# Patient Record
Sex: Female | Born: 1960 | Race: White | Hispanic: No | Marital: Married | Smoking: Never smoker
Health system: Southern US, Community
[De-identification: ages and names within clinical notes are randomized; demographics above are authoritative.]

## PROBLEM LIST (undated history)

## (undated) DIAGNOSIS — K589 Irritable bowel syndrome without diarrhea: Secondary | ICD-10-CM

## (undated) DIAGNOSIS — G4733 Obstructive sleep apnea (adult) (pediatric): Secondary | ICD-10-CM

## (undated) DIAGNOSIS — I2510 Atherosclerotic heart disease of native coronary artery without angina pectoris: Secondary | ICD-10-CM

## (undated) DIAGNOSIS — M1990 Unspecified osteoarthritis, unspecified site: Secondary | ICD-10-CM

## (undated) DIAGNOSIS — G4730 Sleep apnea, unspecified: Secondary | ICD-10-CM

## (undated) DIAGNOSIS — R7303 Prediabetes: Secondary | ICD-10-CM

## (undated) DIAGNOSIS — M2550 Pain in unspecified joint: Secondary | ICD-10-CM

## (undated) DIAGNOSIS — F329 Major depressive disorder, single episode, unspecified: Secondary | ICD-10-CM

## (undated) DIAGNOSIS — E785 Hyperlipidemia, unspecified: Secondary | ICD-10-CM

## (undated) DIAGNOSIS — K8020 Calculus of gallbladder without cholecystitis without obstruction: Secondary | ICD-10-CM

## (undated) DIAGNOSIS — K56609 Unspecified intestinal obstruction, unspecified as to partial versus complete obstruction: Secondary | ICD-10-CM

## (undated) DIAGNOSIS — I10 Essential (primary) hypertension: Secondary | ICD-10-CM

## (undated) DIAGNOSIS — T7840XA Allergy, unspecified, initial encounter: Secondary | ICD-10-CM

## (undated) DIAGNOSIS — T8859XA Other complications of anesthesia, initial encounter: Secondary | ICD-10-CM

## (undated) DIAGNOSIS — M549 Dorsalgia, unspecified: Secondary | ICD-10-CM

## (undated) DIAGNOSIS — K219 Gastro-esophageal reflux disease without esophagitis: Secondary | ICD-10-CM

## (undated) DIAGNOSIS — E669 Obesity, unspecified: Secondary | ICD-10-CM

## (undated) DIAGNOSIS — F32A Depression, unspecified: Secondary | ICD-10-CM

## (undated) DIAGNOSIS — R112 Nausea with vomiting, unspecified: Secondary | ICD-10-CM

## (undated) HISTORY — PX: APPENDECTOMY: SHX54

## (undated) HISTORY — DX: Gastro-esophageal reflux disease without esophagitis: K21.9

## (undated) HISTORY — DX: Obstructive sleep apnea (adult) (pediatric): G47.33

## (undated) HISTORY — DX: Dorsalgia, unspecified: M54.9

## (undated) HISTORY — DX: Allergy, unspecified, initial encounter: T78.40XA

## (undated) HISTORY — DX: Hyperlipidemia, unspecified: E78.5

## (undated) HISTORY — DX: Essential (primary) hypertension: I10

## (undated) HISTORY — DX: Major depressive disorder, single episode, unspecified: F32.9

## (undated) HISTORY — DX: Unspecified osteoarthritis, unspecified site: M19.90

## (undated) HISTORY — DX: Pain in unspecified joint: M25.50

## (undated) HISTORY — DX: Calculus of gallbladder without cholecystitis without obstruction: K80.20

## (undated) HISTORY — DX: Irritable bowel syndrome, unspecified: K58.9

## (undated) HISTORY — PX: TONSILLECTOMY: SUR1361

## (undated) HISTORY — DX: Prediabetes: R73.03

## (undated) HISTORY — DX: Depression, unspecified: F32.A

## (undated) HISTORY — PX: UPPER GASTROINTESTINAL ENDOSCOPY: SHX188

## (undated) HISTORY — DX: Obesity, unspecified: E66.9

## (undated) HISTORY — PX: COLONOSCOPY: SHX174

## (undated) HISTORY — DX: Sleep apnea, unspecified: G47.30

## (undated) HISTORY — DX: Unspecified intestinal obstruction, unspecified as to partial versus complete obstruction: K56.609

## (undated) HISTORY — PX: NASAL SINUS SURGERY: SHX719

## (undated) HISTORY — PX: BREAST REDUCTION SURGERY: SHX8

## (undated) HISTORY — PX: CHOLECYSTECTOMY: SHX55

---

## 1998-12-30 ENCOUNTER — Encounter: Payer: Self-pay | Admitting: Internal Medicine

## 1998-12-30 ENCOUNTER — Ambulatory Visit (HOSPITAL_COMMUNITY): Admission: RE | Admit: 1998-12-30 | Discharge: 1998-12-30 | Payer: Self-pay | Admitting: Internal Medicine

## 1999-05-20 ENCOUNTER — Encounter: Payer: Self-pay | Admitting: Emergency Medicine

## 1999-05-20 ENCOUNTER — Emergency Department (HOSPITAL_COMMUNITY): Admission: EM | Admit: 1999-05-20 | Discharge: 1999-05-21 | Payer: Self-pay | Admitting: Emergency Medicine

## 1999-05-25 ENCOUNTER — Other Ambulatory Visit: Admission: RE | Admit: 1999-05-25 | Discharge: 1999-05-25 | Payer: Self-pay | Admitting: *Deleted

## 1999-06-12 ENCOUNTER — Observation Stay (HOSPITAL_COMMUNITY): Admission: RE | Admit: 1999-06-12 | Discharge: 1999-06-13 | Payer: Self-pay | Admitting: General Surgery

## 1999-06-12 ENCOUNTER — Encounter (INDEPENDENT_AMBULATORY_CARE_PROVIDER_SITE_OTHER): Payer: Self-pay | Admitting: Specialist

## 2000-03-01 ENCOUNTER — Other Ambulatory Visit: Admission: RE | Admit: 2000-03-01 | Discharge: 2000-03-01 | Payer: Self-pay | Admitting: Obstetrics and Gynecology

## 2001-01-14 ENCOUNTER — Other Ambulatory Visit: Admission: RE | Admit: 2001-01-14 | Discharge: 2001-01-14 | Payer: Self-pay | Admitting: *Deleted

## 2001-08-25 ENCOUNTER — Other Ambulatory Visit: Admission: RE | Admit: 2001-08-25 | Discharge: 2001-08-25 | Payer: Self-pay | Admitting: *Deleted

## 2002-04-01 ENCOUNTER — Other Ambulatory Visit: Admission: RE | Admit: 2002-04-01 | Discharge: 2002-04-01 | Payer: Self-pay | Admitting: *Deleted

## 2002-06-06 ENCOUNTER — Encounter: Payer: Self-pay | Admitting: Emergency Medicine

## 2002-06-06 ENCOUNTER — Emergency Department (HOSPITAL_COMMUNITY): Admission: EM | Admit: 2002-06-06 | Discharge: 2002-06-06 | Payer: Self-pay | Admitting: Emergency Medicine

## 2002-09-10 ENCOUNTER — Other Ambulatory Visit: Admission: RE | Admit: 2002-09-10 | Discharge: 2002-09-10 | Payer: Self-pay | Admitting: *Deleted

## 2003-09-06 ENCOUNTER — Other Ambulatory Visit: Admission: RE | Admit: 2003-09-06 | Discharge: 2003-09-06 | Payer: Self-pay | Admitting: Family Medicine

## 2003-12-14 ENCOUNTER — Encounter: Admission: RE | Admit: 2003-12-14 | Discharge: 2003-12-14 | Payer: Self-pay | Admitting: Family Medicine

## 2004-03-13 ENCOUNTER — Encounter: Admission: RE | Admit: 2004-03-13 | Discharge: 2004-03-13 | Payer: Self-pay | Admitting: Internal Medicine

## 2004-05-05 ENCOUNTER — Encounter: Admission: RE | Admit: 2004-05-05 | Discharge: 2004-05-05 | Payer: Self-pay | Admitting: Obstetrics and Gynecology

## 2004-07-24 ENCOUNTER — Encounter: Admission: RE | Admit: 2004-07-24 | Discharge: 2004-07-24 | Payer: Self-pay | Admitting: Obstetrics and Gynecology

## 2004-10-25 ENCOUNTER — Ambulatory Visit: Payer: Self-pay | Admitting: Family Medicine

## 2004-12-22 ENCOUNTER — Ambulatory Visit: Payer: Self-pay | Admitting: Family Medicine

## 2005-01-17 ENCOUNTER — Encounter: Admission: RE | Admit: 2005-01-17 | Discharge: 2005-04-17 | Payer: Self-pay | Admitting: Family Medicine

## 2005-04-11 ENCOUNTER — Ambulatory Visit: Payer: Self-pay | Admitting: Internal Medicine

## 2005-04-18 ENCOUNTER — Encounter: Admission: RE | Admit: 2005-04-18 | Discharge: 2005-07-17 | Payer: Self-pay

## 2005-05-10 ENCOUNTER — Ambulatory Visit (HOSPITAL_BASED_OUTPATIENT_CLINIC_OR_DEPARTMENT_OTHER): Admission: RE | Admit: 2005-05-10 | Discharge: 2005-05-10 | Payer: Self-pay | Admitting: Internal Medicine

## 2005-05-13 ENCOUNTER — Ambulatory Visit: Payer: Self-pay | Admitting: Internal Medicine

## 2005-07-09 ENCOUNTER — Ambulatory Visit: Payer: Self-pay | Admitting: Internal Medicine

## 2005-07-10 ENCOUNTER — Ambulatory Visit: Payer: Self-pay | Admitting: Internal Medicine

## 2005-07-14 ENCOUNTER — Ambulatory Visit: Payer: Self-pay | Admitting: Internal Medicine

## 2005-07-16 ENCOUNTER — Ambulatory Visit: Payer: Self-pay | Admitting: Internal Medicine

## 2005-07-25 ENCOUNTER — Encounter: Admission: RE | Admit: 2005-07-25 | Discharge: 2005-10-23 | Payer: Self-pay | Admitting: Counselor

## 2005-08-20 ENCOUNTER — Ambulatory Visit: Payer: Self-pay | Admitting: Internal Medicine

## 2005-08-28 ENCOUNTER — Encounter: Admission: RE | Admit: 2005-08-28 | Discharge: 2005-08-28 | Payer: Self-pay | Admitting: *Deleted

## 2006-04-26 ENCOUNTER — Ambulatory Visit: Payer: Self-pay | Admitting: Internal Medicine

## 2006-05-21 ENCOUNTER — Ambulatory Visit: Payer: Self-pay | Admitting: Gastroenterology

## 2006-08-29 ENCOUNTER — Ambulatory Visit: Payer: Self-pay | Admitting: Internal Medicine

## 2006-08-29 ENCOUNTER — Encounter: Admission: RE | Admit: 2006-08-29 | Discharge: 2006-08-29 | Payer: Self-pay | Admitting: *Deleted

## 2006-08-30 ENCOUNTER — Ambulatory Visit: Payer: Self-pay | Admitting: Internal Medicine

## 2006-08-30 LAB — CONVERTED CEMR LAB
Chloride: 107 meq/L (ref 96–112)
Creatinine, Ser: 0.8 mg/dL (ref 0.4–1.2)
Free T4: 0.5 ng/dL — ABNORMAL LOW (ref 0.9–1.8)
Glomerular Filtration Rate, Af Am: 100 mL/min/{1.73_m2}
Glucose, Bld: 82 mg/dL (ref 70–99)
HDL: 40.9 mg/dL (ref >39.0)
MCHC: 33.7 g/dL (ref 30.0–36.0)
MCV: 89.3 fL (ref 78.0–100.0)
RBC: 4.54 M/uL (ref 3.87–5.11)
RDW: 12.7 % (ref 11.5–14.6)
Sodium: 141 meq/L (ref 135–145)
T3, Free: 3.1 pg/mL (ref 2.3–4.2)
Triglyceride fasting, serum: 99 mg/dL (ref 0–149)

## 2006-10-01 ENCOUNTER — Ambulatory Visit: Payer: Self-pay | Admitting: Internal Medicine

## 2006-12-11 ENCOUNTER — Ambulatory Visit: Payer: Self-pay | Admitting: Internal Medicine

## 2006-12-11 LAB — CONVERTED CEMR LAB: TSH: 3.33 microintl units/mL (ref 0.35–5.50)

## 2007-02-09 ENCOUNTER — Encounter: Payer: Self-pay | Admitting: Internal Medicine

## 2007-06-20 ENCOUNTER — Encounter: Payer: Self-pay | Admitting: Internal Medicine

## 2007-06-27 ENCOUNTER — Encounter: Payer: Self-pay | Admitting: Internal Medicine

## 2007-07-10 ENCOUNTER — Encounter: Admission: RE | Admit: 2007-07-10 | Discharge: 2007-09-30 | Payer: Self-pay | Admitting: Surgery

## 2007-08-04 ENCOUNTER — Encounter: Payer: Self-pay | Admitting: Internal Medicine

## 2007-08-22 ENCOUNTER — Encounter: Payer: Self-pay | Admitting: Internal Medicine

## 2007-09-01 ENCOUNTER — Encounter: Admission: RE | Admit: 2007-09-01 | Discharge: 2007-09-01 | Payer: Self-pay | Admitting: Obstetrics & Gynecology

## 2007-09-22 ENCOUNTER — Encounter: Admission: RE | Admit: 2007-09-22 | Discharge: 2007-12-21 | Payer: Self-pay | Admitting: Surgery

## 2007-09-22 ENCOUNTER — Ambulatory Visit: Payer: Self-pay | Admitting: Internal Medicine

## 2007-09-22 DIAGNOSIS — F419 Anxiety disorder, unspecified: Secondary | ICD-10-CM

## 2007-09-22 DIAGNOSIS — I10 Essential (primary) hypertension: Secondary | ICD-10-CM | POA: Insufficient documentation

## 2007-09-22 DIAGNOSIS — J3089 Other allergic rhinitis: Secondary | ICD-10-CM

## 2007-09-22 DIAGNOSIS — E785 Hyperlipidemia, unspecified: Secondary | ICD-10-CM | POA: Insufficient documentation

## 2007-09-22 DIAGNOSIS — G4733 Obstructive sleep apnea (adult) (pediatric): Secondary | ICD-10-CM | POA: Insufficient documentation

## 2007-09-22 DIAGNOSIS — Z9189 Other specified personal risk factors, not elsewhere classified: Secondary | ICD-10-CM

## 2007-09-22 DIAGNOSIS — F32A Depression, unspecified: Secondary | ICD-10-CM | POA: Insufficient documentation

## 2007-09-22 DIAGNOSIS — E349 Endocrine disorder, unspecified: Secondary | ICD-10-CM | POA: Insufficient documentation

## 2007-09-22 DIAGNOSIS — J302 Other seasonal allergic rhinitis: Secondary | ICD-10-CM | POA: Insufficient documentation

## 2007-09-22 DIAGNOSIS — F329 Major depressive disorder, single episode, unspecified: Secondary | ICD-10-CM | POA: Insufficient documentation

## 2007-09-22 HISTORY — DX: Other specified personal risk factors, not elsewhere classified: Z91.89

## 2007-09-22 HISTORY — PX: LAPAROSCOPIC GASTRIC BANDING: SHX1100

## 2007-09-23 ENCOUNTER — Encounter: Payer: Self-pay | Admitting: Internal Medicine

## 2007-10-06 ENCOUNTER — Ambulatory Visit (HOSPITAL_COMMUNITY): Admission: RE | Admit: 2007-10-06 | Discharge: 2007-10-07 | Payer: Self-pay | Admitting: Surgery

## 2007-10-22 ENCOUNTER — Encounter: Payer: Self-pay | Admitting: Internal Medicine

## 2008-01-15 ENCOUNTER — Encounter: Admission: RE | Admit: 2008-01-15 | Discharge: 2008-01-15 | Payer: Self-pay | Admitting: Surgery

## 2008-04-15 ENCOUNTER — Encounter: Admission: RE | Admit: 2008-04-15 | Discharge: 2008-04-15 | Payer: Self-pay | Admitting: Surgery

## 2008-06-24 ENCOUNTER — Encounter: Payer: Self-pay | Admitting: Internal Medicine

## 2008-07-26 ENCOUNTER — Ambulatory Visit: Payer: Self-pay | Admitting: Internal Medicine

## 2008-07-30 ENCOUNTER — Ambulatory Visit: Payer: Self-pay | Admitting: Internal Medicine

## 2008-08-05 ENCOUNTER — Encounter (INDEPENDENT_AMBULATORY_CARE_PROVIDER_SITE_OTHER): Payer: Self-pay | Admitting: *Deleted

## 2008-08-05 LAB — CONVERTED CEMR LAB
ALT: 21 units/L (ref 0–35)
AST: 19 units/L (ref 0–37)
BUN: 19 mg/dL (ref 6–23)
Basophils Absolute: 0 10*3/uL (ref 0.0–0.1)
Basophils Relative: 0.2 % (ref 0.0–3.0)
CO2: 28 meq/L (ref 19–32)
Chloride: 104 meq/L (ref 96–112)
Cholesterol: 163 mg/dL (ref 0–200)
Eosinophils Relative: 1.1 % (ref 0.0–5.0)
Glucose, Bld: 75 mg/dL (ref 70–99)
Hemoglobin: 13.3 g/dL (ref 12.0–15.0)
LDL Cholesterol: 92 mg/dL (ref 0–99)
Lymphocytes Relative: 29.4 % (ref 12.0–46.0)
MCHC: 34.4 g/dL (ref 30.0–36.0)
Neutro Abs: 5.6 10*3/uL (ref 1.4–7.7)
Potassium: 3.9 meq/L (ref 3.5–5.1)
RBC: 4.38 M/uL (ref 3.87–5.11)
Saturation Ratios: 23.2 % (ref 20.0–50.0)
TSH: 3.03 microintl units/mL (ref 0.35–5.50)
Transferrin: 310.7 mg/dL (ref >=212.0)
WBC: 8.6 10*3/uL (ref 4.5–10.5)

## 2008-09-02 ENCOUNTER — Encounter: Admission: RE | Admit: 2008-09-02 | Discharge: 2008-09-02 | Payer: Self-pay | Admitting: Obstetrics and Gynecology

## 2008-09-15 ENCOUNTER — Encounter (INDEPENDENT_AMBULATORY_CARE_PROVIDER_SITE_OTHER): Payer: Self-pay | Admitting: Diagnostic Radiology

## 2008-09-15 ENCOUNTER — Encounter: Admission: RE | Admit: 2008-09-15 | Discharge: 2008-09-15 | Payer: Self-pay | Admitting: Obstetrics and Gynecology

## 2008-10-27 ENCOUNTER — Telehealth: Payer: Self-pay | Admitting: Family Medicine

## 2008-11-10 ENCOUNTER — Telehealth (INDEPENDENT_AMBULATORY_CARE_PROVIDER_SITE_OTHER): Payer: Self-pay | Admitting: *Deleted

## 2008-11-12 ENCOUNTER — Telehealth (INDEPENDENT_AMBULATORY_CARE_PROVIDER_SITE_OTHER): Payer: Self-pay | Admitting: *Deleted

## 2009-06-14 ENCOUNTER — Encounter: Admission: RE | Admit: 2009-06-14 | Discharge: 2009-06-14 | Payer: Self-pay | Admitting: Obstetrics and Gynecology

## 2009-09-01 ENCOUNTER — Encounter: Payer: Self-pay | Admitting: Internal Medicine

## 2009-09-01 ENCOUNTER — Encounter: Admission: RE | Admit: 2009-09-01 | Discharge: 2009-09-01 | Payer: Self-pay | Admitting: Internal Medicine

## 2009-09-05 LAB — CONVERTED CEMR LAB

## 2009-09-07 ENCOUNTER — Encounter: Payer: Self-pay | Admitting: Internal Medicine

## 2009-09-12 ENCOUNTER — Encounter: Payer: Self-pay | Admitting: Internal Medicine

## 2009-10-12 ENCOUNTER — Ambulatory Visit: Payer: Self-pay | Admitting: Internal Medicine

## 2009-10-12 DIAGNOSIS — K219 Gastro-esophageal reflux disease without esophagitis: Secondary | ICD-10-CM | POA: Insufficient documentation

## 2009-10-12 LAB — CONVERTED CEMR LAB: Vit D, 25-Hydroxy: 31 ng/mL (ref 30–89)

## 2009-10-17 ENCOUNTER — Encounter (INDEPENDENT_AMBULATORY_CARE_PROVIDER_SITE_OTHER): Payer: Self-pay | Admitting: *Deleted

## 2009-10-17 LAB — CONVERTED CEMR LAB
BUN: 10 mg/dL (ref 6–23)
Bilirubin, Direct: 0 mg/dL (ref 0.0–0.3)
Chloride: 109 meq/L (ref 96–112)
Eosinophils Relative: 1.6 % (ref 0.0–5.0)
GFR calc non Af Amer: 71.28 mL/min (ref >60)
Glucose, Bld: 76 mg/dL (ref 70–99)
HCT: 41.3 % (ref 36.0–46.0)
HDL: 52 mg/dL (ref >39.00)
Hemoglobin: 13.7 g/dL (ref 12.0–15.0)
LDL Cholesterol: 111 mg/dL — ABNORMAL HIGH (ref 0–99)
Lymphs Abs: 3.4 10*3/uL (ref 0.7–4.0)
MCV: 93.4 fL (ref 78.0–100.0)
Monocytes Absolute: 0.5 10*3/uL (ref 0.1–1.0)
Monocytes Relative: 4.4 % (ref 3.0–12.0)
Neutro Abs: 7.2 10*3/uL (ref 1.4–7.7)
Platelets: 308 10*3/uL (ref 150.0–400.0)
Potassium: 4.8 meq/L (ref 3.5–5.1)
RDW: 12.4 % (ref 11.5–14.6)
Sodium: 145 meq/L (ref 135–145)
Total Bilirubin: 0.6 mg/dL (ref 0.3–1.2)
VLDL: 19.2 mg/dL (ref 0.0–40.0)
WBC: 11.4 10*3/uL — ABNORMAL HIGH (ref 4.5–10.5)

## 2010-07-12 ENCOUNTER — Telehealth: Payer: Self-pay | Admitting: Internal Medicine

## 2010-08-22 ENCOUNTER — Telehealth: Payer: Self-pay | Admitting: Internal Medicine

## 2010-08-23 ENCOUNTER — Ambulatory Visit: Payer: Self-pay | Admitting: Internal Medicine

## 2010-08-24 ENCOUNTER — Encounter (INDEPENDENT_AMBULATORY_CARE_PROVIDER_SITE_OTHER): Payer: Self-pay | Admitting: *Deleted

## 2010-08-24 LAB — CONVERTED CEMR LAB: Triglycerides: 150 mg/dL

## 2010-09-08 ENCOUNTER — Ambulatory Visit: Payer: Self-pay | Admitting: Internal Medicine

## 2010-09-08 ENCOUNTER — Encounter: Admission: RE | Admit: 2010-09-08 | Discharge: 2010-09-08 | Payer: Self-pay | Admitting: Internal Medicine

## 2010-09-12 LAB — CONVERTED CEMR LAB
BUN: 10 mg/dL (ref 6–23)
Basophils Absolute: 0 10*3/uL (ref 0.0–0.1)
Calcium: 9.2 mg/dL (ref 8.4–10.5)
Cholesterol: 182 mg/dL (ref 0–200)
Creatinine, Ser: 0.9 mg/dL (ref 0.4–1.2)
Eosinophils Absolute: 0.1 10*3/uL (ref 0.0–0.7)
Folate: >20 ng/mL
GFR calc non Af Amer: 72.87 mL/min (ref >60)
Glucose, Bld: 76 mg/dL (ref 70–99)
HDL: 54.9 mg/dL (ref >39.00)
LDL Cholesterol: 117 mg/dL — ABNORMAL HIGH (ref 0–99)
Lymphocytes Relative: 32.5 % (ref 12.0–46.0)
MCHC: 33.9 g/dL (ref 30.0–36.0)
Monocytes Relative: 3.5 % (ref 3.0–12.0)
Neutro Abs: 6.2 10*3/uL (ref 1.4–7.7)
Neutrophils Relative %: 62.6 % (ref 43.0–77.0)
Platelets: 321 10*3/uL (ref 150.0–400.0)
RDW: 13.6 % (ref 11.5–14.6)
TSH: 2.25 microintl units/mL (ref 0.35–5.50)
VLDL: 10.4 mg/dL (ref 0.0–40.0)

## 2010-09-21 ENCOUNTER — Ambulatory Visit: Payer: Self-pay | Admitting: Internal Medicine

## 2010-11-02 ENCOUNTER — Telehealth (INDEPENDENT_AMBULATORY_CARE_PROVIDER_SITE_OTHER): Payer: Self-pay | Admitting: *Deleted

## 2010-11-06 ENCOUNTER — Telehealth: Payer: Self-pay | Admitting: Internal Medicine

## 2010-11-21 NOTE — Assessment & Plan Note (Signed)
Summary: cpx/lab/cbs   Vital Signs:  Patient profile:   50 year old female Height:      64.25 inches Weight:      172.38 pounds Pulse rate:   90 / minute Pulse rhythm:   regular BP sitting:   128 / 82  (left arm) Cuff size:   regular  Vitals Entered By: Army Fossa CMA (September 21, 2010 10:07 AM) CC: CPX- already had labwork. Comments Discuss Pt being so cold. [facility].   History of Present Illness:  CPX  Current Medications (verified): 1)  Imitrex 25 Mg  Tabs (Sumatriptan Succinate) .... As Needed (Menstrual Migraines) 2)  Lisinopril 10 Mg  Tabs (Lisinopril) .... Qd 3)  Lipitor 20 Mg  Tabs (Atorvastatin Calcium) .Marland Kitchen.. 1 By Mouth Once Daily 4)  Flonase 50 Mcg/act  Susp (Fluticasone Propionate) .... Prn 5)  Nortriptyline Hcl 75 Mg  Caps (Nortriptyline Hcl) .... Qd 6)  Mulit-Vitamin .Marland Kitchen.. 1 By Mouth Once Daily 7)  Deplin 7.5 Mg Tabs (L-Methylfolate) .... Once Daily 8)  Ranitidine Hcl 75 Mg Tabs (Ranitidine Hcl) .Marland Kitchen.. 1 By Mouth Once Daily 9)  Bayer Low Strength 81 Mg Tbec (Aspirin) .Marland Kitchen.. 1 By Mouth Once Daily 10)  Calcium W/vitamin D .... 1 Daily 11)  Cryselle-28 0.3-30 Mg-Mcg Tabs (Norgestrel-Ethinyl Estradiol) .... As Directed  Allergies (verified): 1)  ! Pcn  Past History:  Past Medical History: Lap band surgery 12-08 Dr Daphine Deutscher Allergic rhinitis Depression Hyperlipidemia Hypertension sleep apnea, no longer on  CPAP GERD  Past Surgical History: Appendectomy Cholecystectomy Tonsillectomy Laser surgery (Cervix) reduction mammoplasty Lap band surgery 12-08 Dr Daphine Deutscher  Family History: Reviewed history from 07/26/2008 and no changes required. DM-- (-)  MI-- (-) CAD-- CABG F , M, GP colon ca-- (-)  breast ca-- (-)   Social History: Married no children Occupation: [employer]-- healthy most exercise-- not as much , but improving. 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 Resp:  Denies cough and shortness of breath. GI:  Denies bloody stools, nausea, and vomiting. GU:  Denies discharge, dysuria, and hematuria. Psych:  symptoms well controlled .  Physical Exam  General:  alert, well-developed, and well-nourished.   Neck:  no masses and no thyromegaly.   Lungs:  normal respiratory effort, no intercostal retractions, no accessory muscle use, and normal breath sounds.   Heart:  normal rate, regular rhythm, no murmur, and no gallop.   Abdomen:  soft and no distention.  has a s.q. nodule in the right upper quadrant  (from  lap band surgery per patient), diffuse mild tenderness throughout without mass or rebound  Pulses:  good bilateral pedal and radial pulses. Good capillary refills in fingers and toes Extremities:  good radial pulses, examination of the hands and nails is normal Psych:  Oriented X3, memory intact for recent and remote, normally interactive, good eye contact, not anxious appearing, and not depressed appearing.     Impression & Recommendations:  Problem # 1:  HEALTH SCREENING (ICD-V70.0) Td 07 got a flu shot already   Female care per  gynecology 08/2009 PAP ----> ASCUS, had a pap this month as well , results pending  08-2010 negative MMG  colonoscopy never had one   diet and exercise discussed  all  labs discussed RTC 1 year  Problem # 2:  other issues --feels cold all the time, no change in the color  of her extremities, symptoms associated with cold weather. TSH normal Vascular exam normal Recommend observation. --also has on and off, ill-defined, generalized abdominal tenderness to palpation. Otherwise  GI ROS is negative symptoms were there before  she had her bariatric surgery but they are slightly worse since then No anemia Patient will call if symptoms increase  Complete Medication List: 1)  Imitrex 25 Mg Tabs (Sumatriptan succinate) .... As needed (menstrual migraines) 2)  Lisinopril 10 Mg Tabs  (Lisinopril) .... Qd 3)  Lipitor 20 Mg Tabs (Atorvastatin calcium) .Marland Kitchen.. 1 by mouth once daily 4)  Flonase 50 Mcg/act Susp (Fluticasone propionate) .... Prn 5)  Nortriptyline Hcl 75 Mg Caps (Nortriptyline hcl) .... Qd 6)  Mulit-vitamin  .Marland Kitchen.. 1 by mouth once daily 7)  Deplin 7.5 Mg Tabs (L-methylfolate) .... Once daily 8)  Ranitidine Hcl 75 Mg Tabs (Ranitidine hcl) .Marland Kitchen.. 1 by mouth once daily 9)  Bayer Low Strength 81 Mg Tbec (Aspirin) .Marland Kitchen.. 1 by mouth once daily 10)  Calcium W/vitamin D  .... 1 daily 11)  Cryselle-28 0.3-30 Mg-mcg Tabs (Norgestrel-ethinyl estradiol) .... As directed  Patient Instructions: 1)  Please schedule a follow-up appointment in 6 months .  Prescriptions: CALCIUM W/VITAMIN D 1 DAILY  #30 x 12   Entered by:   Army Fossa CMA   Authorized by:   Nolon Rod. Cheryllynn Sarff MD   Signed by:   Army Fossa CMA on 09/21/2010   Method used:   Print then Give to Patient   RxID:   [id number] BAYER LOW STRENGTH 81 MG TBEC (ASPIRIN) 1 by mouth once daily  #30 x 12   Entered by:   Army Fossa CMA   Authorized by:   Nolon Rod. Garielle Mroz MD   Signed by:   Army Fossa CMA on 09/21/2010   Method used:   Print then Give to Patient   RxID:   [id number] RANITIDINE HCL 75 MG TABS (RANITIDINE HCL) 1 by mouth once daily  #30 x 12   Entered by:   Army Fossa CMA   Authorized by:   Nolon Rod. Saurabh Hettich MD   Signed by:   Army Fossa CMA on 09/21/2010   Method used:   Print then Give to Patient   RxID:   [id number] MULIT-VITAMIN 1 by mouth once daily  #30 x 12   Entered by:   Army Fossa CMA   Authorized by:   Nolon Rod. Lizania Bouchard MD   Signed by:   Army Fossa CMA on 09/21/2010   Method used:   Print then Give to Patient   RxID:   [id number] FLONASE 50 MCG/ACT  SUSP (FLUTICASONE PROPIONATE) prn  #3 x 3   Entered by:   Army Fossa CMA   Authorized by:   Nolon Rod. Slate Debroux MD   Signed by:   Army Fossa CMA on 09/21/2010   Method used:   Print then Give to Patient    RxID:   [id number] LIPITOR 20 MG  TABS (ATORVASTATIN CALCIUM) 1 by mouth once daily  #90 x 3   Entered by:   Army Fossa CMA   Authorized by:   Nolon Rod. Crystalina Stodghill MD   Signed by:   Army Fossa CMA on 09/21/2010   Method used:   Print then Give to Patient   RxID:   [id number] LISINOPRIL 10 MG  TABS (LISINOPRIL) qd  #90 x 3   Entered by:   Army Fossa CMA   Authorized by:   Nolon Rod. Sanyiah Kanzler MD   Signed  by:   Army Fossa CMA on 09/21/2010   Method used:   Print then Give to Patient   RxID:   [id number]    Orders Added: 1)  Est. Patient age 73-64 [99396]     Risk Factors:  Alcohol use:  no

## 2010-11-21 NOTE — Miscellaneous (Signed)
Summary: labs from wellness exam  Clinical Lists Changes  Observations: Added new observation of TRIGLYC TOT: 150 mg/dL (47/82/9562 13:08) Added new observation of HDL: 75 mg/dL (65/78/4696 29:52) Added new observation of GLUCOSE SER: 95 mg/dL (84/13/2440 10:27)   done 05/17/2010

## 2010-11-21 NOTE — Progress Notes (Signed)
Summary: Deplin request  Phone Note Call from Patient Call back at Home Phone [phone]   Summary of Call: Patient is requesting prescription for Deplin due to not being able to get it from other MD. Patient called their office and has been notified that due to not being seen in over a year, she is no longer a patient there. Patient is requesting to receive from [facility]. Please advise. Initial call taken by: Lucious Groves CMA,  July 12, 2010 11:32 AM  Follow-up for Phone Call        okay to refill, please clarify the dose with the patient Follow-up by: [facility] E. Paz MD,  July 12, 2010 3:38 PM  Additional Follow-up for Phone Call Additional follow up Details #1::        Patient notified and would like enough to get her by until office visit. Patient has appt in Nov, so will #60. Patient aware to pick up tomorrow afternoon. Additional Follow-up by: Lucious Groves CMA,  July 12, 2010 4:35 PM    Prescriptions: DEPLIN 7.5 MG TABS (L-METHYLFOLATE) once daily  #30 x 1   Entered by:   Lucious Groves CMA   Authorized by:   Nolon Rod. Paz MD   Signed by:   Lucious Groves CMA on 07/12/2010   Method used:   Print then Give to Patient   RxID:   [id number]

## 2010-11-21 NOTE — Assessment & Plan Note (Signed)
Summary: DISCUSS/REFILL MEDS/KB   Vital Signs:  Patient profile:   50 year old female Height:      64.25 inches Weight:      166 pounds BMI:     28.37 Pulse rate:   85 / minute Pulse rhythm:   regular BP sitting:   110 / 72  (left arm) Cuff size:   regular  Vitals Entered By: Army Fossa CMA (August 23, 2010 3:50 PM) CC: Pt here to discuss meds Comments refill on deplin and nortriptyline  3 month supply print our rx's   History of Present Illness: patient likes to transfer the care of her depression from her psychiatrist to this office. She has a long history of depression, in the past she tried a # of medicines. She met with Dr. Donell Beers 4 years ago, after several tries she ended up taking amitriptyline and eventually adding deplin, after which she felt excellent. She has been on the current medications for 2 years and this is stable.  ROS complaining of feeling cold in her extremities  a lot, this is going on x a while. Denies change in the color of her hands with changes in temperature weight is stable She brought the results of her screening at work, HDL is excellent. her sugar is normal   Current Medications (verified): 1)  Imitrex 25 Mg  Tabs (Sumatriptan Succinate) .... As Needed (Menstrual Migraines) 2)  Lisinopril 10 Mg  Tabs (Lisinopril) .... Qd 3)  Lipitor 20 Mg  Tabs (Atorvastatin Calcium) .Marland Kitchen.. 1 By Mouth Once Daily 4)  Flonase 50 Mcg/act  Susp (Fluticasone Propionate) .... Prn 5)  Nortriptyline Hcl 75 Mg  Caps (Nortriptyline Hcl) .... Qd 6)  Mulit-Vitamin .Marland Kitchen.. 1 By Mouth Once Daily 7)  Deplin 7.5 Mg Tabs (L-Methylfolate) .... Once Daily 8)  Ranitidine Hcl 75 Mg Tabs (Ranitidine Hcl) .Marland Kitchen.. 1 By Mouth Once Daily 9)  Bayer Low Strength 81 Mg Tbec (Aspirin) .Marland Kitchen.. 1 By Mouth Once Daily 10)  Calcium W/vitamin D .... 1 Daily 11)  Cryselle-28 0.3-30 Mg-Mcg Tabs (Norgestrel-Ethinyl Estradiol) .... As Directed  Allergies (verified): 1)  ! Pcn  Past  History:  Past Medical History: Reviewed history from 10/12/2009 and no changes required. Lap band surgery 12-08 Dr Daphine Deutscher Allergic rhinitis Depression Hyperlipidemia Hypertension sleep apnea, no longer on  CPAP GERD  Past Surgical History: Reviewed history from 10/12/2009 and no changes required. Appendectomy Cholecystectomy Tonsillectomy Laser surgery (Cervix) reduction mammoplasty  Social History: Reviewed history from 10/12/2009 and no changes required. Married no children Occupation: [employer]-- healthy exercise-- not as much , not consistently tobacco-- no ETOH-- rarely  Physical Exam  General:  alert and well-developed.   Lungs:  normal respiratory effort, no intercostal retractions, no accessory muscle use, and normal breath sounds.   Heart:  normal rate, regular rhythm, no murmur, and no gallop.   Extremities:  good radial pulses, examination of the hands and nails is normal   Impression & Recommendations:  Problem # 1:  DEPRESSION (ICD-311) transferring her care  to this office, see history of present illness will RF nortriptyline and deplin   Her updated medication list for this problem includes:    Nortriptyline Hcl 75 Mg Caps (Nortriptyline hcl) ..... Qd  Problem # 2:  HYPERTENSION (ICD-401.9) at goal  Her updated medication list for this problem includes:    Lisinopril 10 Mg Tabs (Lisinopril) ..... Qd  BP today: 110/72 Prior BP: 104/60 (10/12/2009)  Labs Reviewed: K+: 4.8 (10/12/2009) Creat: : 0.9 (  10/12/2009)   Chol: 182 (10/12/2009)   HDL: 52.00 (10/12/2009)   LDL: 111 (10/12/2009)   TG: 96.0 (10/12/2009)  Problem # 3:  HEALTH SCREENING (ICD-V70.0) likes to get her blood work ahead of her physical exam, see instructions Wonders why she feels so cold,not clear to me why. We'll recheck a TSH. No evidence of Raynaud's phenomena on clinical grounds  Complete Medication List: 1)  Imitrex 25 Mg Tabs (Sumatriptan succinate) .... As needed  (menstrual migraines) 2)  Lisinopril 10 Mg Tabs (Lisinopril) .... Qd 3)  Lipitor 20 Mg Tabs (Atorvastatin calcium) .Marland Kitchen.. 1 by mouth once daily 4)  Flonase 50 Mcg/act Susp (Fluticasone propionate) .... Prn 5)  Nortriptyline Hcl 75 Mg Caps (Nortriptyline hcl) .... Qd 6)  Mulit-vitamin  .Marland Kitchen.. 1 by mouth once daily 7)  Deplin 7.5 Mg Tabs (L-methylfolate) .... Once daily 8)  Ranitidine Hcl 75 Mg Tabs (Ranitidine hcl) .Marland Kitchen.. 1 by mouth once daily 9)  Bayer Low Strength 81 Mg Tbec (Aspirin) .Marland Kitchen.. 1 by mouth once daily 10)  Calcium W/vitamin D  .... 1 daily 11)  Cryselle-28 0.3-30 Mg-mcg Tabs (Norgestrel-ethinyl estradiol) .... As directed  Patient Instructions: 1)  came back few days before your physical for labs,  fasting 2)  BMP, TSH, CBC , Vit D, B12, Folic Acid, FLP.  ---  Dx V70 Prescriptions: NORTRIPTYLINE HCL 75 MG  CAPS (NORTRIPTYLINE HCL) qd  #90 x 4   Entered and Authorized by:   Elita Quick E. Paz MD   Signed by:   Nolon Rod. Paz MD on 08/23/2010   Method used:   Print then Give to Patient   RxID:   [id number] DEPLIN 7.5 MG TABS (L-METHYLFOLATE) once daily  #180 x 3   Entered and Authorized by:   Nolon Rod. Paz MD   Signed by:   Nolon Rod. Paz MD on 08/23/2010   Method used:   Print then Give to Patient   RxID:   [id number]    Orders Added: 1)  Est. Patient Level III [84696]

## 2010-11-21 NOTE — Progress Notes (Signed)
Summary: question about meds from another doctor  Phone Note Call from Patient Call back at Home Phone [phone]   Caller: Patient Summary of Call: patient see psychologist Dr Bari Mantis likes the doctor, but she does not care for the office staff in his office  Will Dr Drue Novel take over his meds?  If not, she needs a referral for a different psychologist ---needs  to be seen before Thanksgiving so she can get a new prescription from the new doctor  please let her know at [phone] Initial call taken by: Jerolyn Shin,  August 22, 2010 4:05 PM  Follow-up for Phone Call        Please advise. Lucious Groves CMA  August 22, 2010 4:35 PM   Additional Follow-up for Phone Call Additional follow up Details #1::        please schedule a office visit, if he is in stable, I can start prescribing her medicines Additional Follow-up by: [facility] E. Paz MD,  August 22, 2010 5:11 PM    Additional Follow-up for Phone Call Additional follow up Details #2::    Patient notified and states that she feels fine, but is worried about running out of meds. Patient schedule visit for today to discuss. Follow-up by: Lucious Groves CMA,  August 23, 2010 12:16 PM

## 2010-11-23 NOTE — Progress Notes (Signed)
Summary: Refill Request  Phone Note Refill Request Call back at [phone] Message from:  Pharmacy on November 02, 2010 11:41 AM  Refills Requested: Medication #1:  FLONASE 50 MCG/ACT  SUSP prn   Dosage confirmed as above?Dosage Confirmed   Supply Requested: 3   Last Refilled: 09/21/2010 [facility]  Next Appointment Scheduled: none Initial call taken by: Harold Barban,  November 02, 2010 11:41 AM    Prescriptions: Aleda Grana 50 MCG/ACT  SUSP (FLUTICASONE PROPIONATE) prn  #3 x 1   Entered by:   Army Fossa CMA   Authorized by:   Nolon Rod. Paz MD   Signed by:   Army Fossa CMA on 11/02/2010   Method used:   Faxed to ...       [facility] (mail-order)             , [place]         Ph: [phone]       Fax: [phone]   RxID:   [id number]

## 2010-11-23 NOTE — Progress Notes (Signed)
Summary: Flonase rx correction  Phone Note From Pharmacy   Summary of Call: [facility] called the office b/c they will not accept flonase prescription as written. It must be more detailed or will not be filled. Initial call taken by: Lucious Groves CMA,  November 06, 2010 9:11 AM    New/Updated Medications: FLONASE 50 MCG/ACT  SUSP (FLUTICASONE PROPIONATE) 2 sprays each nostril prn Prescriptions: FLONASE 50 MCG/ACT  SUSP (FLUTICASONE PROPIONATE) 2 sprays each nostril prn  #3 x 1   Entered by:   Lucious Groves CMA   Authorized by:   Nolon Rod. Truth Wolaver MD   Signed by:   Lucious Groves CMA on 11/06/2010   Method used:   Faxed to ...       [facility] (mail-order)             , [place]         Ph: [phone]       Fax: [phone]   RxID:   [id number]

## 2011-03-06 NOTE — Op Note (Signed)
[name], [name]                 ACCOUNT NO.:  [id number]   MEDICAL RECORD NO.:  [id number]          PATIENT TYPE:  AMB   LOCATION:  DAY                          FACILITY:  [facility]   PHYSICIAN:  Thornton Park. Daphine Deutscher, MD  DATE OF BIRTH:  [birth year]   DATE OF PROCEDURE:  10/06/2007  DATE OF DISCHARGE:                               OPERATIVE REPORT   PREOPERATIVE DIAGNOSIS:  Morbid obesity, BMI 36.5 with hypertension,  hypercholesterolemia, sleep apnea and depression.   PROCEDURE:  Lap band APS (Allergan).   SURGEON:  Luretha Murphy, M.D.   ASSISTANT:  Lorne Skeens. Hoxworth, M.D.   DESCRIPTION OF PROCEDURE:  Rebekah [name] is a 50 year old lady taken to  room 1 on the afternoon of October 06, 2007 given general anesthesia.  The abdomen was prepped with Techni-Care and draped sterilely.  The  abdomen was entered through the left upper quadrant with 0 degree 10 mm  scope OptiVu and abdomen was insufflated again without difficulty and  entered.  Standard port placement was used for band include the 15 in  the upper right midline.  The incision was made on the left side of the  stomach along the crura. Then I put in the APS balloon sizer, blew it up  to 15 mL, put it back and did not see significant hiatal hernia.  Went  ahead and passed the band passer from the right crus to the left side  easily and then inserted the APS band which I then threaded through,  brought around, locked it after putting it over the sizer.  Then I  plicated the stomach anteriorly over with three sutures using tie knots.  Everything looked good and I brought it out through the lower port which  I enlarged.  I flushed it, let the excess pressure come off and I  connected it to the port and then I sutured this into the fascia with  four sutures of 2-0 Prolene without difficulty.  Wounds were all  irrigated closed 4-0 Vicryl with Benzoin Steri-Strips.  The patient  seemed to tolerate the procedure well was taken to  recovery room in  satisfactory addition.      Thornton Park Daphine Deutscher, MD  Electronically Signed     MBM/MEDQ  D:  10/06/2007  T:  10/07/2007  Job:  [id number]   cc:   Willow Ora, MD  [phone] [address] [place], [place] [address]

## 2011-03-09 NOTE — Assessment & Plan Note (Signed)
HEALTHCARE                           GASTROENTEROLOGY OFFICE NOTE   TIMICA, MARCOM                        MRN:          [id number]  DATE:05/21/2006                            DOB:          [birth year]    REFERRING PHYSICIAN:  Willow Ora, MD.   REASON FOR REFERRAL:  Recurrent diarrhea.   HISTORY OF PRESENT ILLNESS:  Rebekah [name] is a 50 year old white female  referred through the courtesy of Dr. Willow Ora.  She relates an acute episode  of diarrhea in May of this year while at the beach.  The other members of  her family with her did not become ill.  She noted a fever and watery  nonbloody diarrhea that lasted for about five or six days.  After that, the  fever and diarrhea abated and her bowel habits returned to normal.  Since  then, she has had relapses of nonbloody watery diarrhea without fevers,  abdominal pain, anorexia, nausea or vomiting.  Her symptoms have recurred  about every week or two.  She was given an empiric course of Cipro for three  days by Dr. Drue Novel and since that time, she has not had any recurrent diarrhea.  It has been approximately two or two and a half weeks.  She has a history of  irritable bowel syndrome with diarrhea, bloating and abdominal discomfort  that was active in her 41s but it has not been active since that time.  Her  current symptoms are not associated with abdominal pain or bloating.  She  relates no history of antibiotic usage prior to the onset of her symptoms.  She has had long-term difficulties tolerating lactose products with  increased intestinal gas.  She notes no dietary changes and drinks about one  to two caffeinated beverages a day.  No family history of colon cancer,  colon polyps or inflammatory bowel disease.  Recent stool studies for fecal  leukocytes, C. difficile toxin and ova and parasites were negative.   PAST MEDICAL HISTORY:  1.  Hypertension.  2.  Hyperlipidemia.  3.  Depression.  4.   Irritable bowel syndrome - inactive.  5.  Allergies as a child.  6.  Status post cholecystectomy.  7.  Status post appendectomy.   CURRENT MEDICATIONS:  Listed on the chart, are being reviewed.   ALLERGIES:  PENICILLIN.   SOCIAL HISTORY:  Per handwritten form.   REVIEW OF SYSTEMS:  Negative.  See handwritten form.   PHYSICAL EXAMINATION:  GENERAL APPEARANCE:  Overweight white female in no  acute distress.  VITAL SIGNS:  Height 5 feet 4-1/2 inches, weight refused.  Weight on April 26, 2006, 219 per Dr. Leta Jungling notes.  Blood pressure 118/62, pulse 88 and  regular.  HEENT:  Sclerae are anicteric.  Oropharynx clear.  CHEST:  Clear to auscultation bilaterally.  CARDIOVASCULAR:  Regular rate and rhythm without murmurs.  ABDOMEN:  Soft, nontender and nondistended with normal active bowel sounds.  No palpable organomegaly, masses or hernias.  EXTREMITIES:  Without clubbing, cyanosis, or edema.  NEUROLOGIC:  Alert and oriented x3.  Grossly  nonfocal.   ASSESSMENT/PLAN:  Relapsing diarrhea.  Suspect post infectious etiology.  Since her symptoms are currently inactive, we will plan for expected  management.  If her symptoms recur, will plan to obtain stool Hemoccults and  repeat stool studies.  Empiric trial of Xifaxan or Flagyl or Cipro and  Flagyl may be considered.  Consider colonoscopy if her symptoms are  recurrent and unrelieved or if her stool is Hemoccult positive.                                   Venita Lick. Pleas Koch., MD, [facility]   MTS/MedQ  DD:  05/21/2006  DT:  05/21/2006  Job #:  [id number]   cc:   Willow Ora, MD

## 2011-03-09 NOTE — Procedures (Signed)
NAMEVICKII, Rebekah [name] NO.:  [id number]   MEDICAL RECORD NO.:  [id number]          PATIENT TYPE:  OUT   LOCATION:  SLEEP CENTER                 FACILITY:  [facility]   PHYSICIAN:  Clinton D. Maple Hudson, M.D. DATE OF BIRTH:  June 03, 1961   DATE OF STUDY:  05/10/2005                              NOCTURNAL POLYSOMNOGRAM   REFERRING PHYSICIAN:  Wanda Plump, MD [facility].   INDICATION FOR STUDY:  Hypersomnia with sleep apnea.  Epworth sleepiness  score:  18/24.  BMI:  37.  Weight:  219 pounds.   SLEEP ARCHITECTURE:  Total sleep time 422 minutes and sleep efficiency 88%.  Stage 1 6%, stage 2 65%, stages 3 and 4 15%, REM 13% of total sleep time.  Sleep latency 46 minutes, REM latency 270 minutes, awake after sleep onset  22 minutes, arousal index 16.  No bedtime medication.   RESPIRATORY DATA:  NPSG protocol.  Respiratory disturbance index (RDI, AHI)  12.9 obstructive events per hour indicating mild obstructive sleep  apnea/hypopnea syndrome.  There were 31 obstructive apneas and 16 hypopneas.  The events were not positional although mostly therefore most events were  recorded while supine.  REM RDI 70.   OXYGEN DATA:  Mild snoring with oxygen desaturation to a nadir of 77%.  Mean  oxygen saturation through the study 96% on room air.   CARDIAC DATA:  Normal sinus rhythm with occasional PVC.   MOVEMENT/PARASOMNIA:  A total of 51 limb jerks were recorded in which 12  were associated with arousal or awakening for a periodic limb movement  arousal index of 1.7 per hour which is probably not significant.   IMPRESSION/RECOMMENDATION:  1.  Mild obstructive sleep apnea/hypopnea syndrome, 12.9 per hour, with mild      snoring and oxygen desaturation of 77%.  Events were most common while      supine and in REM.  REM suppression therapy with a tricyclic      antidepressant would be one consideration.  2.  Scores in this range may qualify for C-PAP titration if there is      documentation of  sleep apnea complications such as hypersomnia or      hypertension.     Clinton D. Maple Hudson, M.D.  Diplomat   CDY/MEDQ  D:  05/13/2005 13:27:44  T:  05/14/2005 09:26:51  Job:  [id number]

## 2011-06-20 ENCOUNTER — Telehealth: Payer: Self-pay | Admitting: *Deleted

## 2011-06-20 ENCOUNTER — Ambulatory Visit (INDEPENDENT_AMBULATORY_CARE_PROVIDER_SITE_OTHER): Payer: Managed Care, Other (non HMO) | Admitting: Family Medicine

## 2011-06-20 ENCOUNTER — Encounter: Payer: Self-pay | Admitting: Family Medicine

## 2011-06-20 VITALS — BP 120/80 | Temp 98.7°F | Ht 64.5 in

## 2011-06-20 DIAGNOSIS — R112 Nausea with vomiting, unspecified: Secondary | ICD-10-CM | POA: Insufficient documentation

## 2011-06-20 DIAGNOSIS — R197 Diarrhea, unspecified: Secondary | ICD-10-CM

## 2011-06-20 LAB — SEDIMENTATION RATE: Sed Rate: 31 mm/hr — ABNORMAL HIGH (ref 0–22)

## 2011-06-20 LAB — CBC WITH DIFFERENTIAL/PLATELET
Basophils Absolute: 0 10*3/uL (ref 0.0–0.1)
Basophils Relative: 0.1 % (ref 0.0–3.0)
Eosinophils Absolute: 0.1 10*3/uL (ref 0.0–0.7)
Eosinophils Relative: 0.3 % (ref 0.0–5.0)
HCT: 43 % (ref 36.0–46.0)
Hemoglobin: 14 g/dL (ref 12.0–15.0)
Lymphocytes Relative: 5.5 % — ABNORMAL LOW (ref 12.0–46.0)
Lymphs Abs: 1.1 10*3/uL (ref 0.7–4.0)
MCHC: 32.7 g/dL (ref 30.0–36.0)
MCV: 88.7 fl (ref 78.0–100.0)
Monocytes Absolute: 0.8 10*3/uL (ref 0.1–1.0)
Monocytes Relative: 3.7 % (ref 3.0–12.0)
Neutro Abs: 18.5 10*3/uL — ABNORMAL HIGH (ref 1.4–7.7)
Neutrophils Relative %: 90.4 % — ABNORMAL HIGH (ref 43.0–77.0)
Platelets: 366 10*3/uL (ref 150.0–400.0)
RBC: 4.85 Mil/uL (ref 3.87–5.11)
RDW: 14.1 % (ref 11.5–14.6)
WBC: 20.5 10*3/uL (ref 4.5–10.5)

## 2011-06-20 LAB — HEPATIC FUNCTION PANEL
ALT: 21 U/L (ref 0–35)
AST: 22 U/L (ref 0–37)
Albumin: 4.1 g/dL (ref 3.5–5.2)
Alkaline Phosphatase: 63 U/L (ref 39–117)
Bilirubin, Direct: 0 mg/dL (ref 0.0–0.3)
Total Bilirubin: 0.3 mg/dL (ref 0.3–1.2)
Total Protein: 7.8 g/dL (ref 6.0–8.3)

## 2011-06-20 LAB — BASIC METABOLIC PANEL
Calcium: 9.1 mg/dL (ref 8.4–10.5)
GFR: 72.64 mL/min (ref >60.00)
Glucose, Bld: 86 mg/dL (ref 70–99)
Sodium: 141 mEq/L (ref 135–145)

## 2011-06-20 LAB — TSH: TSH: 1.08 u[IU]/mL (ref 0.35–5.50)

## 2011-06-20 NOTE — Telephone Encounter (Signed)
Message copied by Verdene Rio on Wed Jun 20, 2011  4:11 PM ------      Message from: Sheliah Hatch      Created: Wed Jun 20, 2011  3:50 PM       Based on elevated WBC and ESR pt has inflammatory process going on in the bowel.  Please call and see if her abdominal pain has worsened (she had very little this AM).  She will need a CT of her abd w/ contrast to r/o diverticulitis or other abdominal process.

## 2011-06-20 NOTE — Assessment & Plan Note (Signed)
Differential includes IBS, IBD, food intolerance, anxiety, infection.  Check labs to r/o infxn, inflammation, thyroid or electrolyte abnormality.  Pt has not had colonoscopy.  If labs show high level of inflammation that would be consistent w/ IBD and would require colonoscopy to dx.  If labs are completely normal a scope may also be in order to determine why pt is having recurring sxs.  Pt expressed understanding and is in agreement w/ plan.

## 2011-06-20 NOTE — Progress Notes (Signed)
  Subjective:    Patient ID: Rebekah [name], female    DOB: [birth year], 50 y.o.   MRN: [id number]  HPI GI issues- woke up this AM w/ nausea and diarrhea.  This is 5th time in last 3 months.  Reports 12-15 episodes of loose stools that progress to watery stools.  Wakes w/ chills but denies fevers- temp was normal this AM.  Vomited x1 this AM and 7 stools today.  No blood in the stools.  Cannot relate this to any particular food.  sxs last 15-16 hrs, 'not even a full day'.  Immodium will help control the diarrhea.  S/p chole.  Has seen Dr Russella Dar in the past.  Pt admits to increased stress over the last 3 months.   Review of Systems For ROS see HPI     Objective:   Physical Exam  Vitals reviewed. Constitutional: She appears well-developed and well-nourished. No distress.  Abdominal: Soft. She exhibits no distension. There is no tenderness. There is no rebound and no guarding.       Hypoactive BS          Assessment & Plan:

## 2011-06-20 NOTE — Telephone Encounter (Signed)
Discuss with patient, awaiting appt info. 

## 2011-06-20 NOTE — Patient Instructions (Signed)
We will notify you of your lab results Treat the diarrhea symptomatically w/ Immodium Drink LOTS of fluids Call with any questions or concerns Depending on the lab work we may need to have you see Dr Russella Dar again Sherri Rad in there!

## 2011-06-21 ENCOUNTER — Telehealth: Payer: Self-pay | Admitting: Family Medicine

## 2011-06-21 ENCOUNTER — Ambulatory Visit (INDEPENDENT_AMBULATORY_CARE_PROVIDER_SITE_OTHER)
Admission: RE | Admit: 2011-06-21 | Discharge: 2011-06-21 | Disposition: A | Discharge status: Home / Self Care | Payer: Managed Care, Other (non HMO) | Source: Ambulatory Visit | Attending: Family Medicine | Admitting: Family Medicine

## 2011-06-21 DIAGNOSIS — R197 Diarrhea, unspecified: Secondary | ICD-10-CM

## 2011-06-21 DIAGNOSIS — D72829 Elevated white blood cell count, unspecified: Secondary | ICD-10-CM

## 2011-06-21 DIAGNOSIS — R112 Nausea with vomiting, unspecified: Secondary | ICD-10-CM

## 2011-06-21 MED ORDER — IOHEXOL 300 MG/ML  SOLN
80.0000 mL | Freq: Once | INTRAMUSCULAR | Status: AC | PRN
  Administered 2011-06-21: 80 mL via INTRAVENOUS

## 2011-06-21 NOTE — Telephone Encounter (Signed)
Will call results once available.

## 2011-06-21 NOTE — Telephone Encounter (Signed)
Message copied by Beverely Low on Thu Jun 21, 2011  3:33 PM ------      Message from: Sheliah Hatch      Created: Thu Jun 21, 2011  3:30 PM       CT scan is normal- this is good news but this doesn't explain pt's sxs or elevated white blood cell count.  Repeat CBC in 1-2 weeks (dx- leukocytosis) to make sure counts are again normal

## 2011-06-21 NOTE — Telephone Encounter (Signed)
Pt notified and will call back to schedule lab appointment. Order placed

## 2011-06-22 ENCOUNTER — Other Ambulatory Visit: Payer: Self-pay | Admitting: Internal Medicine

## 2011-06-22 ENCOUNTER — Telehealth: Payer: Self-pay | Admitting: Internal Medicine

## 2011-06-22 DIAGNOSIS — Z1231 Encounter for screening mammogram for malignant neoplasm of breast: Secondary | ICD-10-CM

## 2011-06-26 MED ORDER — PROMETHAZINE HCL 25 MG PO TABS: 12.5000 mg | ORAL_TABLET | Freq: Four times a day (QID) | ORAL | Status: AC | PRN

## 2011-06-26 NOTE — Telephone Encounter (Signed)
Pt wanted to know if she needs to fast for lab work and if she could get an Rx for phenergan.  Per Dr. Beverely Low, send phenergan 25 mg to pharmacy

## 2011-06-26 NOTE — Telephone Encounter (Signed)
Please call pt and ask her how we can help

## 2011-07-04 ENCOUNTER — Other Ambulatory Visit: Payer: Self-pay | Admitting: Internal Medicine

## 2011-07-05 ENCOUNTER — Other Ambulatory Visit (INDEPENDENT_AMBULATORY_CARE_PROVIDER_SITE_OTHER): Payer: Managed Care, Other (non HMO)

## 2011-07-05 DIAGNOSIS — D7289 Other specified disorders of white blood cells: Secondary | ICD-10-CM

## 2011-07-05 DIAGNOSIS — D72829 Elevated white blood cell count, unspecified: Secondary | ICD-10-CM

## 2011-07-05 NOTE — Progress Notes (Signed)
Labs only

## 2011-07-06 LAB — CBC WITH DIFFERENTIAL/PLATELET
Eosinophils Relative: 1.6 % (ref 0.0–5.0)
Lymphocytes Relative: 35.3 % (ref 12.0–46.0)
MCV: 88.4 fl (ref 78.0–100.0)
Monocytes Absolute: 0.5 10*3/uL (ref 0.1–1.0)
Neutrophils Relative %: 57.4 % (ref 43.0–77.0)
Platelets: 309 10*3/uL (ref 150.0–400.0)
WBC: 9.3 10*3/uL (ref 4.5–10.5)

## 2011-07-06 NOTE — Progress Notes (Signed)
Labs only

## 2011-07-27 LAB — CBC
HCT: 37.6
Platelets: 312
RBC: 4.26
WBC: 9

## 2011-07-27 LAB — DIFFERENTIAL
Eosinophils Relative: 0
Lymphocytes Relative: 13
Lymphs Abs: 1.2
Neutrophils Relative %: 83 — ABNORMAL HIGH

## 2011-07-27 LAB — PREGNANCY, URINE: Preg Test, Ur: NEGATIVE

## 2011-07-30 LAB — BASIC METABOLIC PANEL
CO2: 25
Chloride: 106
GFR calc Af Amer: >60
Sodium: 140

## 2011-07-30 LAB — HEMOGLOBIN AND HEMATOCRIT, BLOOD: HCT: 37.9

## 2011-07-31 ENCOUNTER — Ambulatory Visit (INDEPENDENT_AMBULATORY_CARE_PROVIDER_SITE_OTHER): Payer: Managed Care, Other (non HMO) | Admitting: Internal Medicine

## 2011-07-31 ENCOUNTER — Encounter: Payer: Self-pay | Admitting: Internal Medicine

## 2011-07-31 DIAGNOSIS — R112 Nausea with vomiting, unspecified: Secondary | ICD-10-CM

## 2011-07-31 DIAGNOSIS — R197 Diarrhea, unspecified: Secondary | ICD-10-CM

## 2011-07-31 DIAGNOSIS — R110 Nausea: Secondary | ICD-10-CM

## 2011-07-31 LAB — POCT URINALYSIS DIPSTICK
Bilirubin, UA: NEGATIVE
Glucose, UA: NEGATIVE
Leukocytes, UA: NEGATIVE
Nitrite, UA: NEGATIVE

## 2011-07-31 NOTE — Progress Notes (Signed)
  Subjective:    Patient ID: Rebekah [name], female    DOB: 02-Apr-1961, 50 y.o.   MRN: [id number]  HPI Chief complaint nausea and vomiting. C/o  6 episodes of nausea and vomiting in the last 5 months. Had a episode was 6 weeks ago, she saw Dr. Beverely Low, chart reviewed: LFTs normal, WBCs 20, CT of the abdomen negative. Repeated CBC showed normal WBCs.  Current  episode started 2 days ago with chills, nausea, vomiting x 2 , felt better yesterday but today she started with nausea and vomiting again. Symptoms are not triggered by food intake, no abdominal pain, stools are loose but not watery. Today she has not attempted to eat solids.  Past Medical History: Lap band surgery 12-08 Dr Daphine Deutscher Allergic rhinitis Depression Hyperlipidemia Hypertension sleep apnea, no longer on  CPAP GERD  Past Surgical History: Appendectomy Cholecystectomy Tonsillectomy Laser surgery (Cervix) reduction mammoplasty Lap band surgery 12-08 Dr Daphine Deutscher  Family History: Reviewed history from 07/26/2008 and no changes required. DM-- (-)  MI-- (-) CAD-- CABG F , M, GP colon ca-- (-)  breast ca-- (-)   Social History: Married no children Occupation: [employer]-- healthy most exercise-- not as much , but improving. 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 No blood in the stools No dysuria or gross hematuria For the last 6 weeks she has not used her GERD to be worse, experiencing regurgitation and "like a raw feeling in the chest", mostly at night.    Objective:   Physical Exam  Constitutional: She appears well-developed and well-nourished.  HENT:  Head: Normocephalic and atraumatic.  Eyes:       Not pale or icteric  Cardiovascular: Normal rate, regular rhythm and normal heart sounds.   No murmur heard. Pulmonary/Chest: Effort normal and breath sounds normal. No respiratory distress. She has no wheezes.  She has no rales.  Abdominal:         Bowel sounds present, not distended, soft, I felt a mass in the right upper quadrant that correlates with the lap band, but there is not rebound or fluctuance. Mild tenderness in other places, see graphic. No mass or rebound.          Assessment & Plan:  Today , I spent more than 25  min with the patient, >50% of the time counseling, and /or reviewing the chart and labs ordered by other providers

## 2011-07-31 NOTE — Assessment & Plan Note (Addendum)
6 episodes of N-V (and loose stools this time) in the last 5 months W/u 8-12 showed a non acute CT of the abdomen, WBCs were temporarily elevated. ROS + for regurgitation UA neg  DDX : GERD, PUD, related to prior gastric band? Plan: nexium instead of ranitidine Refer to Dr Daphine Deutscher ER if sx severe, fever, abdominal pain  Consider GI eval

## 2011-07-31 NOTE — Patient Instructions (Signed)
Rest, clear liquids Change to nexium 1 a day, see if that helps better than ranitidine, call for a prescription if needed Phenergan as needed ok ER if symptoms severe, fever, stomach painb

## 2011-08-07 ENCOUNTER — Other Ambulatory Visit: Payer: Self-pay | Admitting: Internal Medicine

## 2011-08-07 NOTE — Telephone Encounter (Signed)
The pt called in and is requesting a rx for Nexium.  She stated according to her insurance, she would need it pre-certified.   She stated she has been on samples of Nexium since her last appt.  She is wanting this called into the [facility] in [place].

## 2011-08-15 MED ORDER — ESOMEPRAZOLE MAGNESIUM 40 MG PO CPDR: 40.0000 mg | DELAYED_RELEASE_CAPSULE | Freq: Every day | ORAL | Status: DC

## 2011-08-15 NOTE — Telephone Encounter (Signed)
Pt indicated that she has tried the following: Rantidine, Tums with no relief. Rx sent to pharmacy for nexium.

## 2011-08-29 ENCOUNTER — Ambulatory Visit (INDEPENDENT_AMBULATORY_CARE_PROVIDER_SITE_OTHER): Payer: Managed Care, Other (non HMO) | Admitting: Surgery

## 2011-08-29 ENCOUNTER — Encounter (INDEPENDENT_AMBULATORY_CARE_PROVIDER_SITE_OTHER): Payer: Self-pay | Admitting: Surgery

## 2011-08-29 VITALS — BP 136/74 | HR 82 | Temp 99.2°F | Resp 16 | Ht 64.0 in | Wt 177.0 lb

## 2011-08-29 DIAGNOSIS — Z9884 Bariatric surgery status: Secondary | ICD-10-CM

## 2011-08-29 NOTE — Progress Notes (Signed)
Rebekah [name] 50 y.o.  Body mass index is 30.38 kg/(m^2).  Patient Active Problem List  Diagnoses  . ENDOGENOUS OBESITY  . HYPERLIPIDEMIA  . DEPRESSION  . HYPERTENSION  . ALLERGIC RHINITIS  . GERD  . SLEEP APNEA  . REDUCTION MAMMOPLASTY, HX OF  . Nausea vomiting and diarrhea    Allergies  Allergen Reactions  . Erythromycin     Gastrointestinal issues.  . Penicillins     REACTION: rash    No past surgical history on file. Willow Ora, MD, MD No diagnosis found.  Having issues with gastroesophageal reflux and some vomiting. I think that she is too tight. I went ahead and removed 2 cc from her lap band. She overall looks great. She is about to go on a cruise. I will see her after her cruise and probably add back 1 cc. Otherwise looks great. Plan followup after Thanksgiving. Matt B. Daphine Deutscher, MD, [facility] [phone] beeper [phone]  08/29/2011 10:32 AM

## 2011-08-29 NOTE — Patient Instructions (Signed)
Watch your diet while your band is more lax. Enjoy your cruise and followup with me after Thanksgiving.

## 2011-08-31 ENCOUNTER — Other Ambulatory Visit: Payer: Self-pay

## 2011-08-31 ENCOUNTER — Encounter (HOSPITAL_COMMUNITY): Payer: Self-pay | Admitting: *Deleted

## 2011-08-31 ENCOUNTER — Emergency Department (HOSPITAL_COMMUNITY)
Admission: EM | Admit: 2011-08-31 | Discharge: 2011-08-31 | Disposition: A | Discharge status: Home / Self Care | Payer: Managed Care, Other (non HMO) | Attending: Emergency Medicine | Admitting: Emergency Medicine

## 2011-08-31 ENCOUNTER — Emergency Department (HOSPITAL_COMMUNITY): Payer: Managed Care, Other (non HMO)

## 2011-08-31 DIAGNOSIS — R160 Hepatomegaly, not elsewhere classified: Secondary | ICD-10-CM | POA: Insufficient documentation

## 2011-08-31 DIAGNOSIS — K219 Gastro-esophageal reflux disease without esophagitis: Secondary | ICD-10-CM | POA: Insufficient documentation

## 2011-08-31 DIAGNOSIS — E785 Hyperlipidemia, unspecified: Secondary | ICD-10-CM | POA: Insufficient documentation

## 2011-08-31 DIAGNOSIS — R0602 Shortness of breath: Secondary | ICD-10-CM | POA: Insufficient documentation

## 2011-08-31 DIAGNOSIS — K209 Esophagitis, unspecified without bleeding: Secondary | ICD-10-CM | POA: Insufficient documentation

## 2011-08-31 DIAGNOSIS — Z9884 Bariatric surgery status: Secondary | ICD-10-CM | POA: Insufficient documentation

## 2011-08-31 DIAGNOSIS — R079 Chest pain, unspecified: Secondary | ICD-10-CM | POA: Insufficient documentation

## 2011-08-31 DIAGNOSIS — Z79899 Other long term (current) drug therapy: Secondary | ICD-10-CM | POA: Insufficient documentation

## 2011-08-31 DIAGNOSIS — I10 Essential (primary) hypertension: Secondary | ICD-10-CM | POA: Insufficient documentation

## 2011-08-31 DIAGNOSIS — M549 Dorsalgia, unspecified: Secondary | ICD-10-CM | POA: Insufficient documentation

## 2011-08-31 LAB — COMPREHENSIVE METABOLIC PANEL
AST: 19 U/L (ref 0–37)
Albumin: 3.5 g/dL (ref 3.5–5.2)
Alkaline Phosphatase: 65 U/L (ref 39–117)
Chloride: 107 mEq/L (ref 96–112)
Creatinine, Ser: 0.82 mg/dL (ref 0.50–1.10)
Potassium: 3.8 mEq/L (ref 3.5–5.1)
Total Bilirubin: 0.2 mg/dL — ABNORMAL LOW (ref 0.3–1.2)

## 2011-08-31 LAB — CBC
HCT: 34.9 % — ABNORMAL LOW (ref 36.0–46.0)
Hemoglobin: 11.3 g/dL — ABNORMAL LOW (ref 12.0–15.0)
MCH: 28.1 pg (ref 26.0–34.0)
MCHC: 32.4 g/dL (ref 30.0–36.0)
MCV: 86.8 fL (ref 78.0–100.0)
Platelets: 366 K/uL (ref 150–400)
RBC: 4.02 MIL/uL (ref 3.87–5.11)
RDW: 14.4 % (ref 11.5–15.5)
WBC: 10.5 K/uL (ref 4.0–10.5)

## 2011-08-31 MED ORDER — SUCRALFATE 1 GM/10ML PO SUSP: 1.0000 g | Freq: Four times a day (QID) | ORAL | Status: DC

## 2011-08-31 NOTE — ED Notes (Signed)
Pt reports 2 day hx of stabbing sensation in center of chest radiating around back, pt reports hx of GERD. States she has been taking Tums without any relief. States it does not feel like acid reflux. States has intermittent severe episodes.

## 2011-08-31 NOTE — ED Notes (Signed)
Pt here with epigastric chest pain that started 2 days ago when she had here lap band loosened due to issues with acid reflux.  States that since then she has been having stabbing pains from the center of her chest down to her stomach that are radiating into her back.  States that this pain feels nothing like her previous issues with GERD.  States that she takes nexium and has been using tums as well with no relief.

## 2011-08-31 NOTE — ED Provider Notes (Addendum)
History     CSN: [id number] Arrival date & time: 08/31/2011  1:28 PM   First MD Initiated Contact with Patient 08/31/11 1740      Chief Complaint  Patient presents with  . Chest Pain    (Consider location/radiation/quality/duration/timing/severity/associated sxs/prior treatment) Patient is a 50 y.o. female presenting with chest pain. The history is provided by the patient.  Chest Pain The chest pain began more  than 1 month ago. Chest pain occurs intermittently. The chest pain is unchanged. Associated with: Lying down at night. The severity of the pain is moderate. The quality of the pain is described as aching and burning. The pain radiates to the upper back. Chest pain is worsened by certain positions. Pertinent negatives for primary symptoms include no fever, no fatigue, no syncope, no shortness of breath, no cough, no wheezing, no vomiting and no altered mental status.  Pertinent negatives for associated symptoms include no near-syncope, no numbness and no orthopnea. She tried proton pump inhibitors (She saw her surgeon 2 days ago, and he let most of the fluid out of the lap band resouvoir.) for the symptoms. Risk factors: She had a lap band procedure, several years ago.  Pertinent negatives for past medical history include no CAD, no cancer and no MI.  Procedure history is negative for cardiac catheterization.     Past Medical History  Diagnosis Date  . GERD (gastroesophageal reflux disease)   . Abdominal pain   . Hypertension   . Hyperlipidemia   . Constipation   . Nausea & vomiting   . Blood in stool     Past Surgical History  Procedure Date  . Laparoscopic gastric banding     History reviewed. No pertinent family history.  History  Substance Use Topics  . Smoking status: Never Smoker   . Smokeless tobacco: Not on file  . Alcohol Use: Yes    OB History    Grav Para Term Preterm Abortions TAB SAB Ect Mult Living                  Review of Systems    Constitutional: Negative for fever and fatigue.  Respiratory: Negative for cough, shortness of breath and wheezing.   Cardiovascular: Positive for chest pain. Negative for orthopnea, syncope and near-syncope.  Gastrointestinal: Negative for vomiting.  Neurological: Negative for numbness.  Psychiatric/Behavioral: Negative for altered mental status.  All other systems reviewed and are negative.    Allergies  Erythromycin and Penicillins  Home Medications   Current Outpatient Rx  Name Route Sig Dispense Refill  . ASPIRIN 81 MG PO TABS Oral Take 81 mg by mouth daily.      . ATORVASTATIN CALCIUM 10 MG PO TABS Oral Take 10 mg by mouth daily.      Marland Kitchen CALCIUM CARBONATE 200 MG PO CAPS Oral Take 250 mg by mouth daily. Viactiv Chews [2] daily.    . TUMS PO Oral Take 3 tablets by mouth 3 (three) times daily as needed. stomach     . ESOMEPRAZOLE MAGNESIUM 40 MG PO CPDR Oral Take 1 capsule (40 mg total) by mouth daily before breakfast. 30 capsule 2  . FLUTICASONE PROPIONATE 50 MCG/ACT NA SUSP Nasal Place 2 sprays into the nose daily.      . DEPLIN 7.5 MG PO TABS Oral Take by mouth daily.     Marland Kitchen LISINOPRIL 10 MG PO TABS Oral Take 10 mg by mouth daily.      Marland Kitchen ONE-DAILY MULTI VITAMINS PO TABS  Oral Take 1 tablet by mouth daily.      Marland Kitchen NORTRIPTYLINE HCL 75 MG PO CAPS Oral Take 75 mg by mouth daily.     . SUCRALFATE 1 GM/10ML PO SUSP Oral Take 10 mLs (1 g total) by mouth 4 (four) times daily. 420 mL 0    BP 130/84  Pulse 90  Temp(Src) 98.6 F (37 C) (Oral)  Resp 16  SpO2 97%  Physical Exam  Constitutional: She is oriented to person, place, and time. She appears well-developed and well-nourished.  HENT:  Head: Normocephalic and atraumatic.  Eyes: EOM are normal. Pupils are equal, round, and reactive to light.  Neck: Normal range of motion.  Cardiovascular: Normal rate and regular rhythm.   Pulmonary/Chest: Effort normal and breath sounds normal.  Abdominal: Soft. She exhibits no distension.  There is hepatomegaly. There is no splenomegaly. There is no rebound, no guarding and no CVA tenderness.    Musculoskeletal: Normal range of motion.  Neurological: She is alert and oriented to person, place, and time. She has normal reflexes.  Skin: Skin is warm and dry.  Psychiatric: She has a normal mood and affect. Her behavior is normal.    ED Course  Procedures (including critical care time) 08/31/2011 OTHER DATA REVIEWED: Nursing notes, vital signs, and past medical records reviewed. Vitals are normal.   DIAGNOSTIC STUDIES: Oxygen Saturation is 97% on room air, normal by my interpretation.    LABS / RADIOLOGY: reviewed   ED COURSE / COORDINATION OF CARE: Patient is comfortable in emergency department and does not require treatment.  Date: 08/31/2011  Rate: 87  Rhythm: normal sinus rhythm  QRS Axis: normal  Intervals: normal  ST/T Wave abnormalities: normal  Conduction Disutrbances:none  Narrative Interpretation:   Old EKG Reviewed: none available MDM: Pain, consistent with esophageal reflux, and likely esophagitis. Doubt cardiac source. No metabolic instability or likely occult infection   PLAN:  Home  With Carafate Rx  The patient is to return the emergency department if there is any worsening of symptoms. I have reviewed the discharge instructions with the patient and family  CONDITION ON DISCHARGE: Good  MEDICATIONS GIVEN IN THE E.D.  Medications  Calcium Carbonate Antacid (TUMS PO) (not administered)  sucralfate (CARAFATE) 1 GM/10ML suspension (not administered)    DISCHARGE MEDICATIONS: New Prescriptions   SUCRALFATE (CARAFATE) 1 GM/10ML SUSPENSION    Take 10 mLs (1 g total) by mouth 4 (four) times daily.     Labs Reviewed  COMPREHENSIVE METABOLIC PANEL - Abnormal; Notable for the following:    Total Bilirubin 0.2 (*)    GFR calc non Af Amer 83 (*)    All other components within normal limits  CBC - Abnormal; Notable for the following:     Hemoglobin 11.3 (*)    HCT 34.9 (*)    All other components within normal limits  POCT I-STAT TROPONIN I  I-STAT TROPONIN I   Dg Chest 2 View  08/31/2011  *RADIOLOGY REPORT*  Clinical Data: Shortness of breath and chest pain.  CHEST - 2 VIEW  Comparison: None.  Findings: The lungs are clear without focal consolidation, edema, effusion or pneumothorax.  Cardiopericardial silhouette is within normal limits for size.  Imaged bony structures of the thorax are intact.  Lap band is seen over the medial left upper quadrant.  IMPRESSION: Normal chest x-ray.  Original Report Authenticated By: ERIC A. MANSELL, M.D.     1. Esophagitis  Flint Melter, MD 08/31/11 1836  Flint Melter, MD 08/31/11 [phone]

## 2011-09-05 ENCOUNTER — Encounter: Payer: Self-pay | Admitting: Internal Medicine

## 2011-09-05 ENCOUNTER — Ambulatory Visit
Admission: RE | Admit: 2011-09-05 | Discharge: 2011-09-05 | Disposition: A | Discharge status: Home / Self Care | Payer: Managed Care, Other (non HMO) | Source: Ambulatory Visit | Attending: Internal Medicine | Admitting: Internal Medicine

## 2011-09-05 ENCOUNTER — Ambulatory Visit (INDEPENDENT_AMBULATORY_CARE_PROVIDER_SITE_OTHER): Payer: Managed Care, Other (non HMO) | Admitting: Internal Medicine

## 2011-09-05 DIAGNOSIS — Z0000 Encounter for general adult medical examination without abnormal findings: Secondary | ICD-10-CM | POA: Insufficient documentation

## 2011-09-05 DIAGNOSIS — Z1231 Encounter for screening mammogram for malignant neoplasm of breast: Secondary | ICD-10-CM

## 2011-09-05 MED ORDER — FLUTICASONE PROPIONATE 50 MCG/ACT NA SUSP: 2.0000 | Freq: Every day | NASAL | Status: DC

## 2011-09-05 MED ORDER — SUCRALFATE 1 GM/10ML PO SUSP: 1.0000 g | Freq: Four times a day (QID) | ORAL | Status: DC

## 2011-09-05 MED ORDER — NORTRIPTYLINE HCL 75 MG PO CAPS: 75.0000 mg | ORAL_CAPSULE | Freq: Every day | ORAL | Status: DC

## 2011-09-05 MED ORDER — LISINOPRIL 10 MG PO TABS: 10.0000 mg | ORAL_TABLET | Freq: Every day | ORAL | Status: DC

## 2011-09-05 MED ORDER — ATORVASTATIN CALCIUM 10 MG PO TABS: 10.0000 mg | ORAL_TABLET | Freq: Every day | ORAL | Status: DC

## 2011-09-05 MED ORDER — DEPLIN 7.5 MG PO TABS: ORAL_TABLET | ORAL | Status: DC

## 2011-09-05 NOTE — Assessment & Plan Note (Addendum)
Td 07 got a flu shot already   Female care per  gynecology  PAP ----> h/o ASCUS, sees gyn rec routine MMG colonoscopy never had one  All labs available reviewed, cholesterol was well controlled 3 weeks ago. No need for labs at this time. diet and exercise discussed  Refill meds

## 2011-09-05 NOTE — Progress Notes (Signed)
  Subjective:    Patient ID: Rebekah [name], female    DOB: May 07, 1961, 50 y.o.   MRN: [id number]  HPI Complete physical exam  Past Medical History  Diagnosis Date  . GERD (gastroesophageal reflux disease)   . Hypertension   . Hyperlipidemia   . GERD (gastroesophageal reflux disease)   . OSA (obstructive sleep apnea)     no longer on CPAP  . Obesity     Lap band surgery 12-08 Dr Daphine Deutscher   Past Surgical History  Procedure Date  . Laparoscopic gastric banding     Lap band surgery 12-08 Dr Daphine Deutscher   History   Social History  . Marital Status: Married    Spouse Name: N/A    Number of Children: 0  . Years of Education: N/A   Occupational History  .  [employer]   Social History Main Topics  . Smoking status: Never Smoker   . Smokeless tobacco: Never Used  . Alcohol Use: Yes     socially   . Drug Use: No  . Sexually Active: Not on file   Other Topics Concern  . Not on file   Social History Narrative   Exercise: active but no regular exercise --   Family History  Problem Relation Age of Onset  . Diabetes Neg Hx   . Colon cancer Neg Hx   . Breast cancer Neg Hx   . Coronary artery disease      CABG F and M , M, GP     Review of Systems Lap band  was adjusted last week, she developed chest pain afterwards, went to the ER, diagnosed with esophagitis, prescribed Carafate, doing much better. Hypertension, good medication compliance, normal ambulatory BPs High cholesterol, good medication compliance. Depression well controlled Currently without chest pain, shortness of breath or cough.    Objective:   Physical Exam  Constitutional: She is oriented to person, place, and time. She appears well-developed and well-nourished. No distress.  HENT:  Head: Normocephalic and atraumatic.  Neck: No thyromegaly present.  Cardiovascular: Normal rate, regular rhythm and normal heart sounds.   No murmur heard. Pulmonary/Chest: Effort normal and breath sounds normal. No respiratory  distress. She has no wheezes. She has no rales.  Abdominal:       Slightly tender in the upper abdomen and mid abdomen, no mass or rebound, normal BS  Musculoskeletal: She exhibits no edema.  Neurological: She is alert and oriented to person, place, and time.  Skin: She is not diaphoretic.  Psychiatric: She has a normal mood and affect. Her behavior is normal. Judgment and thought content normal.      Assessment & Plan:

## 2011-09-27 ENCOUNTER — Other Ambulatory Visit: Payer: Self-pay

## 2011-09-27 MED ORDER — ESOMEPRAZOLE MAGNESIUM 40 MG PO CPDR: 40.0000 mg | DELAYED_RELEASE_CAPSULE | Freq: Every day | ORAL | Status: DC

## 2011-09-27 MED ORDER — SUCRALFATE 1 GM/10ML PO SUSP: 1.0000 g | Freq: Four times a day (QID) | ORAL | Status: AC

## 2011-09-27 NOTE — Telephone Encounter (Signed)
Pt requesting refill of carafate sent to [facility] and nexium/[employer].  Carafate sent to [facility]. Nexium faxed to [facility]

## 2011-10-03 ENCOUNTER — Ambulatory Visit (INDEPENDENT_AMBULATORY_CARE_PROVIDER_SITE_OTHER): Payer: Managed Care, Other (non HMO) | Admitting: Surgery

## 2011-10-03 ENCOUNTER — Encounter (INDEPENDENT_AMBULATORY_CARE_PROVIDER_SITE_OTHER): Payer: Self-pay | Admitting: Surgery

## 2011-10-03 VITALS — BP 124/88 | HR 60 | Temp 98.4°F | Resp 16 | Ht 64.0 in | Wt 183.4 lb

## 2011-10-03 DIAGNOSIS — E349 Endocrine disorder, unspecified: Secondary | ICD-10-CM

## 2011-10-03 NOTE — Patient Instructions (Signed)

## 2011-10-03 NOTE — Progress Notes (Signed)
Rebekah [name] comes in today having gained some weight during her crus. I went ahead and added 2 cc and Bactroban and we will now start this process over her weight of 183. I will see her again in about 6 weeks.Marland Kitchen

## 2011-10-04 ENCOUNTER — Ambulatory Visit (INDEPENDENT_AMBULATORY_CARE_PROVIDER_SITE_OTHER): Payer: Managed Care, Other (non HMO) | Admitting: Surgery

## 2011-10-04 ENCOUNTER — Encounter (INDEPENDENT_AMBULATORY_CARE_PROVIDER_SITE_OTHER): Payer: Self-pay | Admitting: Surgery

## 2011-10-04 VITALS — BP 122/82 | HR 68 | Temp 97.6°F | Resp 16 | Ht 64.0 in | Wt 180.8 lb

## 2011-10-04 DIAGNOSIS — Z9884 Bariatric surgery status: Secondary | ICD-10-CM

## 2011-10-04 NOTE — Patient Instructions (Signed)

## 2011-10-04 NOTE — Progress Notes (Signed)
Rebekah [name] comes back today because she is too tight. Had a 2 cc to her yesterday Saturday it took at half a cc and she seems to be able to swallow fine. I'll see her at her regularly scheduled appointment.

## 2011-10-09 ENCOUNTER — Other Ambulatory Visit: Payer: Self-pay | Admitting: Internal Medicine

## 2011-10-09 MED ORDER — ESOMEPRAZOLE MAGNESIUM 40 MG PO CPDR: 40.0000 mg | DELAYED_RELEASE_CAPSULE | Freq: Every day | ORAL | Status: DC

## 2011-10-09 NOTE — Telephone Encounter (Signed)
Rx resent to pharmacy

## 2011-11-16 ENCOUNTER — Encounter (INDEPENDENT_AMBULATORY_CARE_PROVIDER_SITE_OTHER): Payer: Managed Care, Other (non HMO) | Admitting: Surgery

## 2011-11-23 ENCOUNTER — Encounter (INDEPENDENT_AMBULATORY_CARE_PROVIDER_SITE_OTHER): Payer: Self-pay

## 2011-11-23 ENCOUNTER — Ambulatory Visit (INDEPENDENT_AMBULATORY_CARE_PROVIDER_SITE_OTHER): Payer: Managed Care, Other (non HMO) | Admitting: Physician Assistant

## 2011-11-23 VITALS — BP 124/82 | Ht 64.0 in | Wt 184.6 lb

## 2011-11-23 DIAGNOSIS — Z4651 Encounter for fitting and adjustment of gastric lap band: Secondary | ICD-10-CM

## 2011-11-23 NOTE — Progress Notes (Signed)
  HISTORY: Rebekah [name] is a 51 y.o.female who received an AP-Standard lap-band in December 2008 by Dr. Daphine Deutscher. She comes in having last been seen in December for fluid removal complaining of increasing weight, large appetite and poor satiety. No further vomiting or regurgitation.  VITAL SIGNS: Filed Vitals:   11/23/11 1005  BP: 124/82    PHYSICAL EXAM: Physical exam reveals a very well-appearing 51 y.o.female in no apparent distress Neurologic: Awake, alert, oriented Psych: Bright affect, conversant Respiratory: Breathing even and unlabored. No stridor or wheezing Abdomen: Soft, nontender, nondistended to palpation. Incisions well-healed. No incisional hernias. Port easily palpated. Extremities: Atraumatic, good range of motion.  ASSESMENT: 51 y.o.  female  s/p AP-Standard lap-band.   PLAN: The patient's port was accessed with a 20G Huber needle without difficulty. Clear fluid was aspirated and 0.25 mL saline was added to the port. The patient was able to swallow water without difficulty following the procedure and was instructed to take clear liquids for the next 24-48 hours and advance slowly as tolerated.

## 2011-11-23 NOTE — Patient Instructions (Signed)
Take clear liquids for the next 48 hours. Thin protein shakes are ok to start on Saturday evening. You may begin foods with the consistency of yogurt, cottage cheese, cream soups, etc. on Sunday. Call us if you have persistent vomiting or regurgitation, night cough or reflux symptoms. Return as scheduled or sooner if you notice no changes in hunger/portion sizes.   

## 2011-11-27 ENCOUNTER — Telehealth: Payer: Self-pay

## 2011-11-27 MED ORDER — DEPLIN 7.5 MG PO TABS: ORAL_TABLET | ORAL | Status: DC

## 2011-11-27 NOTE — Telephone Encounter (Signed)
Call from patient requesting a script for Deplin be sent to [facility]. She stated her normal pharmacy will not fill it under her insurance. Please advise     KP

## 2011-11-27 NOTE — Telephone Encounter (Signed)
Ok 90, 3 RF 

## 2011-11-27 NOTE — Telephone Encounter (Signed)
Refill done.  

## 2011-11-27 NOTE — Telephone Encounter (Signed)
OK to refill Deplin #[phone]?

## 2011-12-05 ENCOUNTER — Other Ambulatory Visit: Payer: Self-pay | Admitting: *Deleted

## 2011-12-05 MED ORDER — DEPLIN 7.5 MG PO TABS: ORAL_TABLET | ORAL | Status: DC

## 2011-12-05 NOTE — Telephone Encounter (Signed)
Pt has requested RX for Deplin to be faxed to pharmacy.  RX was previously faxed, but pharmacy states they have not received. RX reprinted and placed in Dr. Leta Jungling sign folder.

## 2011-12-20 ENCOUNTER — Encounter (INDEPENDENT_AMBULATORY_CARE_PROVIDER_SITE_OTHER): Payer: Self-pay

## 2011-12-20 ENCOUNTER — Ambulatory Visit (INDEPENDENT_AMBULATORY_CARE_PROVIDER_SITE_OTHER): Payer: Managed Care, Other (non HMO) | Admitting: Physician Assistant

## 2011-12-20 VITALS — BP 114/78 | Ht 64.0 in | Wt 183.6 lb

## 2011-12-20 DIAGNOSIS — K219 Gastro-esophageal reflux disease without esophagitis: Secondary | ICD-10-CM

## 2011-12-20 DIAGNOSIS — Z4651 Encounter for fitting and adjustment of gastric lap band: Secondary | ICD-10-CM

## 2011-12-20 NOTE — Progress Notes (Signed)
  HISTORY: Rebekah [name] is a 51 y.o.female who received an AP-Standard lap-band in December 2008 by Dr. Daphine Deutscher. She comes in with complaints of nightly reflux, not alleviated by daily Nexium. She said this began shortly after her last fill.  VITAL SIGNS: Filed Vitals:   12/20/11 1153  BP: 114/78    PHYSICAL EXAM: Physical exam reveals a very well-appearing 51 y.o.female in no apparent distress Neurologic: Awake, alert, oriented Psych: Bright affect, conversant Respiratory: Breathing even and unlabored. No stridor or wheezing Abdomen: Soft, nontender, nondistended to palpation. Incisions well-healed. No incisional hernias. Port easily palpated. Extremities: Atraumatic, good range of motion.  ASSESMENT: 51 y.o.  female  s/p AP-Standard lap-band.   PLAN: The patient's port was accessed with a 20G Huber needle without difficulty. Clear fluid was aspirated and 0.25 mL saline was removed from the port. She asked if there was a more affordable alternative to Nexium. I mentioned omeprazole, and asked her to speak with Dr. Drue Novel regarding changing her prescription.

## 2011-12-20 NOTE — Patient Instructions (Signed)
Return in three months or sooner if symptoms persist.

## 2012-03-13 ENCOUNTER — Encounter (INDEPENDENT_AMBULATORY_CARE_PROVIDER_SITE_OTHER): Payer: Managed Care, Other (non HMO)

## 2012-03-25 ENCOUNTER — Encounter (INDEPENDENT_AMBULATORY_CARE_PROVIDER_SITE_OTHER): Payer: Self-pay | Admitting: Surgery

## 2012-03-27 ENCOUNTER — Encounter (INDEPENDENT_AMBULATORY_CARE_PROVIDER_SITE_OTHER): Payer: Self-pay

## 2012-03-27 ENCOUNTER — Ambulatory Visit (INDEPENDENT_AMBULATORY_CARE_PROVIDER_SITE_OTHER): Payer: Managed Care, Other (non HMO) | Admitting: Physician Assistant

## 2012-03-27 VITALS — BP 112/76 | Ht 64.0 in | Wt 180.6 lb

## 2012-03-27 DIAGNOSIS — Z4651 Encounter for fitting and adjustment of gastric lap band: Secondary | ICD-10-CM

## 2012-03-27 NOTE — Progress Notes (Signed)
  HISTORY: Rebekah [name] is a 51 y.o.female who received an AP-Standard lap-band in December 2008 by Dr. Daphine Deutscher. She comes in today with complaints of eating more and being hungry. She was last seen in February for reflux symptoms which subsided after removal of 0.25 mL fluid. She denies reflux or regurgitation at present. She would like a fill.  VITAL SIGNS: Filed Vitals:   03/27/12 0843  BP: 112/76    PHYSICAL EXAM: Physical exam reveals a very well-appearing 51 y.o.female in no apparent distress Neurologic: Awake, alert, oriented Psych: Bright affect, conversant Respiratory: Breathing even and unlabored. No stridor or wheezing Abdomen: Soft, nontender, nondistended to palpation. Incisions well-healed. No incisional hernias. Port easily palpated. Extremities: Atraumatic, good range of motion.  ASSESMENT: 51 y.o.  female  s/p AP-Standard lap-band.   PLAN: The patient's port was accessed with a 20G Huber needle without difficulty. Clear fluid was aspirated and 0.2 mL saline was added to the port. The patient was able to swallow water without difficulty following the procedure and was instructed to take clear liquids for the next 24-48 hours and advance slowly as tolerated.

## 2012-03-27 NOTE — Patient Instructions (Signed)
Take clear liquids tonight. Thin protein shakes are ok to start tomorrow morning. Slowly advance your diet thereafter. Call us if you have persistent vomiting or regurgitation, night cough or reflux symptoms. Return as scheduled or sooner if you notice no changes in hunger/portion sizes.  

## 2012-04-03 ENCOUNTER — Other Ambulatory Visit: Payer: Self-pay | Admitting: Dermatology

## 2012-05-15 ENCOUNTER — Ambulatory Visit (HOSPITAL_BASED_OUTPATIENT_CLINIC_OR_DEPARTMENT_OTHER)
Admission: RE | Admit: 2012-05-15 | Discharge: 2012-05-15 | Disposition: A | Discharge status: Home / Self Care | Payer: Managed Care, Other (non HMO) | Source: Ambulatory Visit | Attending: Family Medicine | Admitting: Family Medicine

## 2012-05-15 ENCOUNTER — Ambulatory Visit (INDEPENDENT_AMBULATORY_CARE_PROVIDER_SITE_OTHER): Payer: Managed Care, Other (non HMO) | Admitting: Family Medicine

## 2012-05-15 ENCOUNTER — Encounter: Payer: Self-pay | Admitting: Family Medicine

## 2012-05-15 VITALS — BP 114/76 | HR 81 | Temp 98.7°F | Wt 181.6 lb

## 2012-05-15 DIAGNOSIS — R109 Unspecified abdominal pain: Secondary | ICD-10-CM

## 2012-05-15 DIAGNOSIS — I10 Essential (primary) hypertension: Secondary | ICD-10-CM | POA: Insufficient documentation

## 2012-05-15 LAB — POCT URINALYSIS DIPSTICK
Leukocytes, UA: NEGATIVE
Protein, UA: NEGATIVE
Urobilinogen, UA: 0.2

## 2012-05-15 NOTE — Progress Notes (Signed)
  Subjective:     Rebekah [name] is a 51 y.o. female who presents for evaluation of constipation. Onset was 5 days ago. Patient has been having rare firm stools today. Defecation has been difficult. Co-Morbid conditions:lap band. Symptoms have gradually worsened. Current Health Habits: Eating fiber? yes - ,, Adequate hydration? yes - water. Current over the counter/prescription laxative: miralax which has been 1 small bm today.  The following portions of the patient's history were reviewed and updated as appropriate: allergies, current medications, past family history, past medical history, past social history, past surgical history and problem list.  Review of Systems Pertinent items are noted in HPI.   Objective:    BP 114/76  Pulse 81  Temp 98.7 F (37.1 C) (Oral)  Wt 181 lb 9.6 oz (82.373 kg)  SpO2 96% General appearance: alert, cooperative, appears stated age and no distress Lungs: clear to auscultation bilaterally Abdomen: abnormal findings:  moderate tenderness in the entire abdomen   Assessment:    Chronic constipation   Plan:    Education about constipation causes and treatment discussed. Laxative con't miralax. Laboratory tests per orders. Plain films (flat plate/upright).

## 2012-05-15 NOTE — Patient Instructions (Addendum)

## 2012-05-16 ENCOUNTER — Encounter: Payer: Self-pay | Admitting: Family Medicine

## 2012-05-16 LAB — CBC WITH DIFFERENTIAL/PLATELET
Basophils Relative: 0.6 % (ref 0.0–3.0)
Eosinophils Relative: 2.3 % (ref 0.0–5.0)
HCT: 37.7 % (ref 36.0–46.0)
Lymphs Abs: 2.5 10*3/uL (ref 0.7–4.0)
MCV: 85.7 fl (ref 78.0–100.0)
Monocytes Absolute: 0.5 10*3/uL (ref 0.1–1.0)
RBC: 4.4 Mil/uL (ref 3.87–5.11)
WBC: 8.1 10*3/uL (ref 4.5–10.5)

## 2012-05-16 LAB — BASIC METABOLIC PANEL
BUN: 10 mg/dL (ref 6–23)
Chloride: 106 mEq/L (ref 96–112)
Potassium: 4.2 mEq/L (ref 3.5–5.1)

## 2012-05-16 LAB — HEPATIC FUNCTION PANEL
ALT: 23 U/L (ref 0–35)
Total Protein: 7.4 g/dL (ref 6.0–8.3)

## 2012-05-16 LAB — TSH: TSH: 1.88 u[IU]/mL (ref 0.35–5.50)

## 2012-06-25 ENCOUNTER — Other Ambulatory Visit: Payer: Self-pay | Admitting: Internal Medicine

## 2012-06-25 DIAGNOSIS — Z1231 Encounter for screening mammogram for malignant neoplasm of breast: Secondary | ICD-10-CM

## 2012-06-25 DIAGNOSIS — Z9889 Other specified postprocedural states: Secondary | ICD-10-CM

## 2012-07-03 ENCOUNTER — Telehealth: Payer: Self-pay | Admitting: Internal Medicine

## 2012-07-03 NOTE — Telephone Encounter (Signed)
Spoke with Rebekah [name], she reports improvement of abdominal pain. Rebekah [name] advised of instructions below and voices understanding. She has scheduled appt tomorrow with Dr Alwyn Ren at 11:30am.

## 2012-07-03 NOTE — Telephone Encounter (Signed)
If she gets worse she needs to go to the urgent care. Otherwise arrange a visit for tomorrow morning with one of my partners here or at the [facility]. (Unfortunately I don't have any openings tomorrow morning) If no appointment is available, she needs to go to the urgent care.

## 2012-07-03 NOTE — Telephone Encounter (Signed)
Caller: Amaria/Patient; Patient Name: Rebekah [name]; PCP: Willow Ora; Best Callback Phone Number: [phone]; Reason for call: Abdominal Pain that she has had in the past. Caller reports three to four times in the past year of severe abdominal pain that lasted about a week. She was seen for one incidence and was advised she was constipated. Pain began again last night, feeling "like a garden trowel going down her intestines", pain in waves every 10 minutes or so. Last BM this am, normal for caller. Denies vomiting, diarrhea or fever. Pain present this am but not as severe. Pain at 3/10 this am as long as she does not touch her abdomen. 6/10 when she touches abdomen.  Pain at 8-9/10 last night at it's worst. Caller is at work this am, working from home. Per Abdominal Pain Protocol,  New onset of constant mild or aching lower abdominal pain, See PCP in 24 hrs. No open appointments this am, Caller advised a note will be sent for a callback about an appointment. Please call Evian with an appointment.

## 2012-07-04 ENCOUNTER — Encounter: Payer: Self-pay | Admitting: Internal Medicine

## 2012-07-04 ENCOUNTER — Ambulatory Visit (INDEPENDENT_AMBULATORY_CARE_PROVIDER_SITE_OTHER): Payer: Managed Care, Other (non HMO) | Admitting: Internal Medicine

## 2012-07-04 VITALS — BP 116/78 | HR 82 | Temp 99.2°F | Wt 183.0 lb

## 2012-07-04 DIAGNOSIS — K3189 Other diseases of stomach and duodenum: Secondary | ICD-10-CM

## 2012-07-04 DIAGNOSIS — R109 Unspecified abdominal pain: Secondary | ICD-10-CM

## 2012-07-04 LAB — POCT URINALYSIS DIPSTICK
Bilirubin, UA: NEGATIVE
Nitrite, UA: NEGATIVE
Urobilinogen, UA: 0.2
pH, UA: 6.5

## 2012-07-04 MED ORDER — HYOSCYAMINE SULFATE 0.125 MG SL SUBL: 0.1250 mg | SUBLINGUAL_TABLET | SUBLINGUAL | Status: DC | PRN

## 2012-07-04 NOTE — Patient Instructions (Addendum)
Stay on clear liquids for 48-72 hours if pain recurs. This would include  jello, sherbert (NOT ice cream), Lipton's chicken noodle soup(NOT cream based soups),Gatorade Lite, flat Ginger ale (without High Fructose Corn Syrup),dry toast or crackers, baked potato.No milk , dairy or grease .Report increasing pain, fever or rectal bleeding

## 2012-07-04 NOTE — Progress Notes (Signed)
  Subjective:    Patient ID: Rebekah [name], female    DOB: 1961/02/06, 51 y.o.   MRN: [id number]  HPI  She presents with abdominal pain which has been present for at least 2 months. She saw Dr.Lowne 05/15/12; that time she had some abdominal pain and constipation. She is unsure which came first. Abdominal films 7/25 revealed no evidence of small bowel obstruction. There was moderate fecal distention of portions of the colon. She's had intermittent abdominal pain since. She describes this as sharp and scraping, "like a garden trawl" being dragged across the abdomen. It is localized without radiation to the left of the umbilicus.  The pain was severe 07/02/12 up to a level X and lasted 12 hours. It did resolve prior to her having to go to the emergency room w/o treatment.  She's actually been having  intestinal symptoms for over a year. She  was seen 06/20/11 with nausea, vomiting, & diarrhea. CAT scan revealed no acute process. It showed evidence of the prior lap band. surgery, cholecystectomy, and appendectomy without other change. This did resolve.  She saw Dr. Daphine Deutscher who performed a LAP-BAND surgery 12/12; he felt there was no relationship to the prior surgery.  Her mother has had a history of recurrence bowel obstructions for which a colostomy was required. This was subsequently reversed      Review of Systems  Present or associated symptoms Nausea/Vomiting:no Diarrhea: no  Constipation: not with prn Miralex Melena/BRBPR: no  Hematemesis:no Anorexia: yes Fever/Chills:no  Dysuria/hematuria/ pyuria: no  Rash: no Back pain: no Wt loss:no EtOH WUJ:WJXB rarely  NSAIDs/ASA:no LMP: 2011, on BCP Vaginal bleeding: no      Objective:   Physical Exam General appearance is one of good health and nourishment w/o distress.  Eyes: No conjunctival inflammation or scleral icterus is present.  Oral exam: Dental hygiene is good; lips and gums are healthy appearing.There is no oropharyngeal  erythema or exudate noted.   Heart:  Normal rate and regular rhythm. S1 and S2 normal without gallop, murmur, click, rub . S 4 w/o extra sounds     Lungs:Chest clear to auscultation; no wheezes, rhonchi,rales ,or rubs present.No increased work of breathing.   Abdomen: bowel sounds normal, soft slightly tender LLQ without masses, organomegaly or hernias noted EXCEPT Lap Band bulb to R of umbilicus.  No guarding or rebound . No AAA  Musculoskeletal: Minimal lordosis of the upper thoracic spine. Negative straight leg raising bilaterally  Peripheral pulses normal without ischemic skin changes  Skin:Warm & dry.  Intact without suspicious lesions or rashes ; no jaundice or tenting  Lymphatic: No lymphadenopathy is noted about the head, neck, axilla areas.              Assessment & Plan:  #1 recurrent left mid lower quadrant abdominal pain with tenderness to palpation. CAT scan and plain films nondiagnostic. Although there's been no evidence of small bowel obstruction she has had multiple surgeries and adhesions must be suspect   Plan: See orders and recommendations

## 2012-07-07 ENCOUNTER — Encounter: Payer: Self-pay | Admitting: Gastroenterology

## 2012-07-16 ENCOUNTER — Other Ambulatory Visit (INDEPENDENT_AMBULATORY_CARE_PROVIDER_SITE_OTHER): Payer: Managed Care, Other (non HMO)

## 2012-07-16 DIAGNOSIS — Z1289 Encounter for screening for malignant neoplasm of other sites: Secondary | ICD-10-CM

## 2012-07-16 LAB — POC HEMOCCULT BLD/STL (OFFICE/1-CARD/DIAGNOSTIC): Fecal Occult Blood, POC: NEGATIVE

## 2012-07-22 ENCOUNTER — Encounter: Payer: Self-pay | Admitting: *Deleted

## 2012-07-31 ENCOUNTER — Encounter: Payer: Self-pay | Admitting: Gastroenterology

## 2012-07-31 ENCOUNTER — Ambulatory Visit (INDEPENDENT_AMBULATORY_CARE_PROVIDER_SITE_OTHER): Payer: Managed Care, Other (non HMO) | Admitting: Gastroenterology

## 2012-07-31 VITALS — BP 128/72 | HR 88 | Ht 64.0 in | Wt 184.0 lb

## 2012-07-31 DIAGNOSIS — R1310 Dysphagia, unspecified: Secondary | ICD-10-CM

## 2012-07-31 DIAGNOSIS — K219 Gastro-esophageal reflux disease without esophagitis: Secondary | ICD-10-CM

## 2012-07-31 DIAGNOSIS — R109 Unspecified abdominal pain: Secondary | ICD-10-CM

## 2012-07-31 DIAGNOSIS — R198 Other specified symptoms and signs involving the digestive system and abdomen: Secondary | ICD-10-CM

## 2012-07-31 MED ORDER — PEG-KCL-NACL-NASULF-NA ASC-C 100 G PO SOLR: 1.0000 | Freq: Once | ORAL | Status: DC

## 2012-07-31 NOTE — Progress Notes (Signed)
History of Present Illness: This is a 51 year old female with a history of LAP-BAND surgery who has had active reflux symptoms for the past several years and intermittently has had nausea and vomiting. She states her reflux symptoms that are better control when she uses Nexium and they are not well controlled on ranitidine. Recently she has noted some odynophagia. She has had history of alternating diarrhea and constipation and recently her bowel pattern has been a bowel movement every 3 days followed by one day with several looser stools. She notes mild lower abdominal pain, described as crampy and intermittent. It appears to be associated with her variable bowel habits. Abdominal films in July showed increased colonic stool. CT scan in August 2012 showed no abnormalities except for prior lap band, prior cholecystectomy and prior appendectomy. Denies weight loss, change in stool caliber, melena, hematochezia, nausea, vomiting, chest pain.  Review of Systems: Pertinent positive and negative review of systems were noted in the above HPI section. All other review of systems were otherwise negative.  Current Medications, Allergies, Past Medical History, Past Surgical History, Family History and Social History were reviewed in [employer] record.  Physical Exam: General: Well developed , well nourished, no acute distress Head: Normocephalic and atraumatic Eyes:  sclerae anicteric, EOMI Ears: Normal auditory acuity Mouth: No deformity or lesions Neck: Supple, no masses or thyromegaly Lungs: Clear throughout to auscultation Heart: Regular rate and rhythm; no murmurs, rubs or bruits Abdomen: Soft, non tender and non distended. Lap band in right upper quadrant. No masses, hepatosplenomegaly or hernias noted. Normal Bowel sounds Rectal: Deferred to colonoscopy Musculoskeletal: Symmetrical with no gross deformities  Skin: No lesions on visible extremities Pulses:  Normal pulses  noted Extremities: No clubbing, cyanosis, edema or deformities noted Neurological: Alert oriented x 4, grossly nonfocal Cervical Nodes:  No significant cervical adenopathy Inguinal Nodes: No significant inguinal adenopathy Psychological:  Alert and cooperative. Normal mood and affect  Assessment and Recommendations:  1. Presumed irritable bowel syndrome with alternating diarrhea and constipation associated with lower abdominal pain. Increase dietary fiber and water intake. Trial of Colace daily if this is not successful MiraLax daily. Use Levsin as needed. Schedule colonoscopy to rule out inflammatory bowel disease and colorectal neoplasms. The risks, benefits, and alternatives to colonoscopy with possible biopsy and possible polypectomy were discussed with the patient and they consent to proceed.   2. Colorectal cancer screening. She is turning 51 this month. Colonoscopy as above.  3. GERD with a history of intermittent vomiting when not taking Nexium. He has mild odynophagia at this time. Continued take all medicines especially doxycycline with a full glass of water. Take all medications at least one hour before recumbency.  4. S/P lap band.

## 2012-07-31 NOTE — Patient Instructions (Addendum)
You have been scheduled for an endoscopy and colonoscopy with propofol. Please follow the written instructions given to you at your visit today. Please pick up your prep at the pharmacy within the next 1-3 days. If you use inhalers (even only as needed), please bring them with you on the day of your procedure.  Patient advised to avoid spicy, acidic, citrus, chocolate, mints, fruit and fruit juices.  Limit the intake of caffeine, alcohol and Soda.  Don't exercise too soon after eating.  Don't lie down within 3-4 hours of eating.  Elevate the head of your bed.  High-Fiber Diet Fiber is found in fruits, vegetables, and grains. A high-fiber diet encourages the addition of more whole grains, legumes, fruits, and vegetables in your diet. The recommended amount of fiber for adult males is 38 g per day. For adult females, it is 25 g per day. Pregnant and lactating women should get 28 g of fiber per day. If you have a digestive or bowel problem, ask your caregiver for advice before adding high-fiber foods to your diet. Eat a variety of high-fiber foods instead of only a select few type of foods.  PURPOSE  To increase stool bulk.  To make bowel movements more regular to prevent constipation.  To lower cholesterol.  To prevent overeating. WHEN IS THIS DIET USED?  It may be used if you have constipation and hemorrhoids.  It may be used if you have uncomplicated diverticulosis (intestine condition) and irritable bowel syndrome.  It may be used if you need help with weight management.  It may be used if you want to add it to your diet as a protective measure against atherosclerosis, diabetes, and cancer. SOURCES OF FIBER  Whole-grain breads and cereals.  Fruits, such as apples, oranges, bananas, berries, prunes, and pears.  Vegetables, such as green peas, carrots, sweet potatoes, beets, broccoli, cabbage, spinach, and artichokes.  Legumes, such split peas, soy, lentils.  Almonds. FIBER  CONTENT IN FOODS Starches and Grains / Dietary Fiber (g)  Cheerios, 1 cup / 3 g  Corn Flakes cereal, 1 cup / 0.7 g  Rice crispy treat cereal, 1 cup / 0.3 g  Instant oatmeal (cooked),  cup / 2 g  Frosted wheat cereal, 1 cup / 5.1 g  Brown, long-grain rice (cooked), 1 cup / 3.5 g  White, long-grain rice (cooked), 1 cup / 0.6 g  Enriched macaroni (cooked), 1 cup / 2.5 g Legumes / Dietary Fiber (g)  Baked beans (canned, plain, or vegetarian),  cup / 5.2 g  Kidney beans (canned),  cup / 6.8 g  Pinto beans (cooked),  cup / 5.5 g Breads and Crackers / Dietary Fiber (g)  Plain or honey graham crackers, 2 squares / 0.7 g  Saltine crackers, 3 squares / 0.3 g  Plain, salted pretzels, 10 pieces / 1.8 g  Whole-wheat bread, 1 slice / 1.9 g  White bread, 1 slice / 0.7 g  Raisin bread, 1 slice / 1.2 g  Plain bagel, 3 oz / 2 g  Flour tortilla, 1 oz / 0.9 g  Corn tortilla, 1 small / 1.5 g  Hamburger or hotdog bun, 1 small / 0.9 g Fruits / Dietary Fiber (g)  Apple with skin, 1 medium / 4.4 g  Sweetened applesauce,  cup / 1.5 g  Banana,  medium / 1.5 g  Grapes, 10 grapes / 0.4 g  Orange, 1 small / 2.3 g  Raisin, 1.5 oz / 1.6 g  Melon, 1 cup /  1.4 g Vegetables / Dietary Fiber (g)  Green beans (canned),  cup / 1.3 g  Carrots (cooked),  cup / 2.3 g  Broccoli (cooked),  cup / 2.8 g  Peas (cooked),  cup / 4.4 g  Mashed potatoes,  cup / 1.6 g  Lettuce, 1 cup / 0.5 g  Corn (canned),  cup / 1.6 g  Tomato,  cup / 1.1 g Document Released: 10/08/2005 Document Revised: 04/08/2012 Document Reviewed: 01/10/2012 [facility] Patient Information 2013 [place], [place].

## 2012-09-04 LAB — HM PAP SMEAR: HM Pap smear: NEGATIVE

## 2012-09-10 ENCOUNTER — Ambulatory Visit (INDEPENDENT_AMBULATORY_CARE_PROVIDER_SITE_OTHER): Payer: Private Health Insurance - Indemnity | Admitting: Internal Medicine

## 2012-09-10 ENCOUNTER — Ambulatory Visit
Admission: RE | Admit: 2012-09-10 | Discharge: 2012-09-10 | Disposition: A | Discharge status: Home / Self Care | Payer: Private Health Insurance - Indemnity | Source: Ambulatory Visit | Attending: Internal Medicine | Admitting: Internal Medicine

## 2012-09-10 ENCOUNTER — Encounter: Payer: Self-pay | Admitting: Internal Medicine

## 2012-09-10 VITALS — BP 102/74 | HR 89 | Temp 98.4°F | Ht 65.5 in | Wt 188.0 lb

## 2012-09-10 DIAGNOSIS — E785 Hyperlipidemia, unspecified: Secondary | ICD-10-CM

## 2012-09-10 DIAGNOSIS — G4730 Sleep apnea, unspecified: Secondary | ICD-10-CM

## 2012-09-10 DIAGNOSIS — F329 Major depressive disorder, single episode, unspecified: Secondary | ICD-10-CM

## 2012-09-10 DIAGNOSIS — F3289 Other specified depressive episodes: Secondary | ICD-10-CM

## 2012-09-10 DIAGNOSIS — Z9889 Other specified postprocedural states: Secondary | ICD-10-CM

## 2012-09-10 DIAGNOSIS — Z1231 Encounter for screening mammogram for malignant neoplasm of breast: Secondary | ICD-10-CM

## 2012-09-10 DIAGNOSIS — I10 Essential (primary) hypertension: Secondary | ICD-10-CM

## 2012-09-10 DIAGNOSIS — Z0000 Encounter for general adult medical examination without abnormal findings: Secondary | ICD-10-CM

## 2012-09-10 MED ORDER — DEPLIN 7.5 MG PO TABS: ORAL_TABLET | ORAL | Status: DC

## 2012-09-10 MED ORDER — ESOMEPRAZOLE MAGNESIUM 40 MG PO CPDR: 40.0000 mg | DELAYED_RELEASE_CAPSULE | Freq: Every day | ORAL | Status: DC

## 2012-09-10 MED ORDER — ATORVASTATIN CALCIUM 10 MG PO TABS: 10.0000 mg | ORAL_TABLET | Freq: Every day | ORAL | Status: DC

## 2012-09-10 MED ORDER — NORTRIPTYLINE HCL 75 MG PO CAPS: 75.0000 mg | ORAL_CAPSULE | Freq: Every day | ORAL | Status: DC

## 2012-09-10 MED ORDER — LISINOPRIL 10 MG PO TABS: 10.0000 mg | ORAL_TABLET | Freq: Every day | ORAL | Status: DC

## 2012-09-10 NOTE — Patient Instructions (Signed)
Check the  blood pressure 2 or 3 times a week, be sure it is between 110/60 and 140/85. If it is consistently higher or lower, let me know  

## 2012-09-10 NOTE — Assessment & Plan Note (Signed)
Has a family history of heart disease, on aspirin and Lipitor. Cholesterol was checked at work 10- 2013: LDL 100. Plan continue with same medicines.

## 2012-09-10 NOTE — Progress Notes (Signed)
  Subjective:    Patient ID: Rebekah [name], female    DOB: Oct 25, 1960, 51 y.o.   MRN: [id number]  HPI CPX In addition, we discussed her chronic medical problems. See assessment and plan   Past Medical History  Diagnosis Date  . GERD (gastroesophageal reflux disease)   . Hypertension   . Hyperlipidemia   . OSA (obstructive sleep apnea)     used a  CPAP before  . Obesity     Lap band surgery 12-08 Dr Daphine Deutscher  . Depression   . Allergy   . Sinus problem    Past Surgical History  Procedure Date  . Laparoscopic gastric banding     Lap band surgery 12-08 Dr Daphine Deutscher  . Cholecystectomy   . Appendectomy   . Nasal sinus surgery   . Breast reduction surgery    History   Social History  . Marital Status: Married    Spouse Name: N/A    Number of Children: 0  . Years of Education: N/A   Occupational History  . [employer]   Social History Main Topics  . Smoking status: Never Smoker   . Smokeless tobacco: Never Used  . Alcohol Use: Yes     Comment: socially   . Drug Use: No  . Sexually Active: Not on file   Other Topics Concern  . Not on file   Social History Narrative   Exercise: sedentary --Diet: not healthy, has gain wt ---   Family History  Problem Relation Age of Onset  . Diabetes Neg Hx   . Colon cancer Neg Hx   . Breast cancer Neg Hx   . Coronary artery disease Father   . Heart disease Mother   . Diverticulitis Mother   . Prostate cancer Father   . Heart disease Brother     Review of Systems No chest pain or shortness of breath Continue with some GI symptoms : occasional diarrhea occasional constipation, no blood in the stools. Under a lot of stress (work), some anxiety, depression well controlled with current medications. States does not need any additional medication for anxiety at this point. Lifestyle needs improvement, see social history. Admits to fatigue, snoring, feeling unrested.     Objective:   Physical Exam General -- alert,  well-developed  Neck --no thyromegaly , normal carotid pulse Lungs -- normal respiratory effort, no intercostal retractions, no accessory muscle use, and normal breath sounds.   Heart-- normal rate, regular rhythm, no murmur, and no gallop.   Abdomen--soft,  no distention, mild tenderness in the epigastric area and lower abdomen, no guarding, and no rigidity.   Extremities-- no pretibial edema bilaterally Neurologic-- alert & oriented X3 and strength normal in all extremities. Psych-- Cognition and judgment appear intact. Alert and cooperative with normal attention span and concentration.  not anxious appearing and not depressed appearing.       Assessment & Plan:

## 2012-09-10 NOTE — Assessment & Plan Note (Addendum)
Ambulatory BPs at rest 106/68, slightly higher at work. continue with same medications, close monitor of her BP, if she change her lifestyle, may need less medication.

## 2012-09-10 NOTE — Assessment & Plan Note (Signed)
After bariatric surgery, she lost weight and stopped cpap, now has sx of OSA again. In addition to work on life style, rec to restart cpap, contact Dr Maple Hudson if needed

## 2012-09-10 NOTE — Assessment & Plan Note (Addendum)
Td 07 got a flu shot already  Interested in the shingles shot, will check with her insurance to see if his cough. Female care per  Gynecology, h/o ASCUS, to be seen today To have a MMG for today colonoscopy to be done this week All labs available reviewed, cholesterol well controlled . No need for labs at this time. Extensive discussion about diet and exercise --->  refer to a nutrionist

## 2012-09-10 NOTE — Assessment & Plan Note (Signed)
Used to see Dr  Donell Beers, last visit ~ 4 years ago. Depression dramatically improved after deplin, currently well controlled except for some anxiety. Plan-- cont same meds, states is not ready for more meds for stress-anxiety

## 2012-09-12 ENCOUNTER — Ambulatory Visit (AMBULATORY_SURGERY_CENTER): Payer: Private Health Insurance - Indemnity | Admitting: Gastroenterology

## 2012-09-12 ENCOUNTER — Encounter: Payer: Self-pay | Admitting: Gastroenterology

## 2012-09-12 VITALS — BP 117/73 | HR 78 | Temp 98.9°F | Resp 51 | Ht 64.0 in | Wt 184.0 lb

## 2012-09-12 DIAGNOSIS — R109 Unspecified abdominal pain: Secondary | ICD-10-CM

## 2012-09-12 DIAGNOSIS — D126 Benign neoplasm of colon, unspecified: Secondary | ICD-10-CM

## 2012-09-12 DIAGNOSIS — R112 Nausea with vomiting, unspecified: Secondary | ICD-10-CM

## 2012-09-12 DIAGNOSIS — R1310 Dysphagia, unspecified: Secondary | ICD-10-CM

## 2012-09-12 DIAGNOSIS — R198 Other specified symptoms and signs involving the digestive system and abdomen: Secondary | ICD-10-CM

## 2012-09-12 DIAGNOSIS — K219 Gastro-esophageal reflux disease without esophagitis: Secondary | ICD-10-CM

## 2012-09-12 MED ORDER — SODIUM CHLORIDE 0.9 % IV SOLN: 500.0000 mL | INTRAVENOUS | Status: DC

## 2012-09-12 NOTE — Op Note (Signed)
[facility] [address] [place], [address]   ENDOSCOPY PROCEDURE REPORT  PATIENT: Rebekah [name], Rebekah [name]  MR#: [id number] BIRTHDATE: Dec 31, 1960 , 51  yrs. old GENDER: Female ENDOSCOPIST: Meryl Dare, MD, [facility] REFERRED BY:  Willow Ora, M.D. PROCEDURE DATE:  09/12/2012 PROCEDURE:  EGD, diagnostic ASA CLASS:     Class II INDICATIONS:  history of esophageal reflux, vomiting. MEDICATIONS: MAC sedation, administered by CRNA, There was residual sedation effect present from prior procedure, and propofol (Diprivan) 120mg  IV TOPICAL ANESTHETIC: Cetacaine Spray DESCRIPTION OF PROCEDURE: After the risks benefits and alternatives of the procedure were thoroughly explained, informed consent was obtained.  The LB GIF-H180 [id number] endoscope was introduced through the mouth and advanced to the second portion of the duodenum. Without limitations.  The instrument was slowly withdrawn as the mucosa was fully examined.  ESOPHAGUS: The mucosa of the esophagus appeared normal. STOMACH: The mucosa and folds of the stomach appeared normal.  Prior lap band DUODENUM: The duodenal mucosa showed no abnormalities in the bulb and second portion of the duodenum.  Retroflexed views revealed a small hiatal hernia.  The scope was then withdrawn from the patient and the procedure completed.  COMPLICATIONS: There were no complications.  ENDOSCOPIC IMPRESSION: 1.   Small hiatal hernia 2.   S/P lap band  RECOMMENDATIONS: 1.  Anti-reflux regimen 2.  Continue PPI   eSigned:  Meryl Dare, MD, [facility] 09/12/2012 2:13 PM

## 2012-09-12 NOTE — Op Note (Signed)
[facility] [address] [place], [address]   COLONOSCOPY PROCEDURE REPORT  PATIENT: Rebekah, [name]  MR#: [id number] BIRTHDATE: 07/06/1961 , 51  yrs. old GENDER: Female ENDOSCOPIST: Meryl Dare, MD, [facility] REFERRED EP:PIRJ Drue Novel, M.D. PROCEDURE DATE:  09/12/2012 PROCEDURE:   Colonoscopy with snare polypectomy ASA CLASS:   Class II INDICATIONS:Change in bowel habits MEDICATIONS: MAC sedation, administered by CRNA and propofol (Diprivan) 280mg  IV DESCRIPTION OF PROCEDURE:   After the risks benefits and alternatives of the procedure were thoroughly explained, informed consent was obtained.  A digital rectal exam revealed no abnormalities of the rectum.   The LB CF-H180AL [id number]  endoscope was introduced through the anus and advanced to the cecum, which was identified by both the appendix and ileocecal valve. No adverse events experienced.   The quality of the prep was good, using MoviPrep  The instrument was then slowly withdrawn as the colon was fully examined.  COLON FINDINGS: A sessile polyp measuring 6 mm in size was found in the transverse colon.  A polypectomy was performed with a cold snare.  The resection was complete and the polyp tissue was completely retrieved.   The colon was otherwise normal.  There was no diverticulosis, inflammation, polyps or cancers unless previously stated.  Retroflexed views revealed no abnormalities. The time to cecum=5 minutes 03 seconds.  Withdrawal time=10 minutes 49 seconds.  The scope was withdrawn and the procedure completed.  COMPLICATIONS: There were no complications.  ENDOSCOPIC IMPRESSION: 1.   Sessile polyp measuring 6 mm in the transverse colon; polypectomy performed with a cold snare 2.   The colon was otherwise normal  RECOMMENDATIONS: 1.  Await pathology results 2.  Repeat colonoscopy in 5 years if polyp adenomatous; otherwise 10 years   eSigned:  Meryl Dare, MD, [facility] 09/12/2012 1:58  PM

## 2012-09-12 NOTE — Patient Instructions (Signed)
YOU HAD AN ENDOSCOPIC PROCEDURE TODAY AT THE [facility]: Refer to the procedure report that was given to you for any specific questions about what was found during the examination.  If the procedure report does not answer your questions, please call your gastroenterologist to clarify.  If you requested that your care partner not be given the details of your procedure findings, then the procedure report has been included in a sealed envelope for you to review at your convenience later.  YOU SHOULD EXPECT: Some feelings of bloating in the abdomen. Passage of more gas than usual.  Walking can help get rid of the air that was put into your GI tract during the procedure and reduce the bloating. If you had a lower endoscopy (such as a colonoscopy or flexible sigmoidoscopy) you may notice spotting of blood in your stool or on the toilet paper. If you underwent a bowel prep for your procedure, then you may not have a normal bowel movement for a few days.  DIET: Your first meal following the procedure should be a light meal and then it is ok to progress to your normal diet.  A half-sandwich or bowl of soup is an example of a good first meal.  Heavy or fried foods are harder to digest and may make you feel nauseous or bloated.  Likewise meals heavy in dairy and vegetables can cause extra gas to form and this can also increase the bloating.  Drink plenty of fluids but you should avoid alcoholic beverages for 24 hours.  ACTIVITY: Your care partner should take you home directly after the procedure.  You should plan to take it easy, moving slowly for the rest of the day.  You can resume normal activity the day after the procedure however you should NOT DRIVE or use heavy machinery for 24 hours (because of the sedation medicines used during the test).    SYMPTOMS TO REPORT IMMEDIATELY: A gastroenterologist can be reached at any hour.  During normal business hours, 8:30 AM to 5:00 PM Monday through Friday,  call [phone].  After hours and on weekends, please call the GI answering service at [phone] who will take a message and have the physician on call contact you.   Following lower endoscopy (colonoscopy or flexible sigmoidoscopy):  Excessive amounts of blood in the stool  Significant tenderness or worsening of abdominal pains  Swelling of the abdomen that is new, acute  Fever of 100F or higher  Following upper endoscopy (EGD)  Vomiting of blood or coffee ground material  New chest pain or pain under the shoulder blades  Painful or persistently difficult swallowing  New shortness of breath  Fever of 100F or higher  Black, tarry-looking stools  FOLLOW UP: If any biopsies were taken you will be contacted by phone or by letter within the next 1-3 weeks.  Call your gastroenterologist if you have not heard about the biopsies in 3 weeks.  Our staff will call the home number listed on your records the next business day following your procedure to check on you and address any questions or concerns that you may have at that time regarding the information given to you following your procedure. This is a courtesy call and so if there is no answer at the home number and we have not heard from you through the emergency physician on call, we will assume that you have returned to your regular daily activities without incident.  SIGNATURES/CONFIDENTIALITY: You and/or your care   partner have signed paperwork which will be entered into your electronic medical record.  These signatures attest to the fact that that the information above on your After Visit Summary has been reviewed and is understood.  Full responsibility of the confidentiality of this discharge information lies with you and/or your care-partner.  

## 2012-09-12 NOTE — Progress Notes (Signed)
Patient did not experience any of the following events: a burn prior to discharge; a fall within the facility; wrong site/side/patient/procedure/implant event; or a hospital transfer or hospital admission upon discharge from the facility. (G8907) Patient did not have preoperative order for IV antibiotic SSI prophylaxis. (G8918)  

## 2012-09-15 ENCOUNTER — Telehealth: Payer: Self-pay | Admitting: *Deleted

## 2012-09-15 NOTE — Telephone Encounter (Signed)
  Follow up Call-  Call back number 09/12/2012  Post procedure Call Back phone  # [phone]  Permission to leave phone message Yes     Patient questions:  Do you have a fever, pain , or abdominal swelling? no Pain Score  0 *  Have you tolerated food without any problems? yes  Have you been able to return to your normal activities? yes  Do you have any questions about your discharge instructions: Diet   no Medications  no Follow up visit  no  Do you have questions or concerns about your Care? no  Actions: * If pain score is 4 or above: No action needed, pain <4.

## 2012-09-16 ENCOUNTER — Telehealth: Payer: Self-pay

## 2012-09-16 NOTE — Telephone Encounter (Signed)
Spoke with pt & she stated that she prefers atorvastatin. I called pharmacy, spoke with alex & he is sending out pt's atorvastatin in the mail.

## 2012-09-16 NOTE — Telephone Encounter (Signed)
Rep from [facility] called [facility] requesting CB to verify if Rx for Lipitor and Atorvastatin needs to be generic or brand. Ref# [id number] CB# [id number] PLz advise  MW

## 2012-09-19 ENCOUNTER — Encounter: Payer: Self-pay | Admitting: Gastroenterology

## 2012-09-21 ENCOUNTER — Encounter: Payer: Self-pay | Admitting: Gastroenterology

## 2012-11-21 ENCOUNTER — Telehealth: Payer: Self-pay | Admitting: *Deleted

## 2012-11-21 NOTE — Telephone Encounter (Signed)
Yes, same strength and same sig

## 2012-11-21 NOTE — Telephone Encounter (Signed)
Verbal ok given over phone to change to tablets

## 2012-11-21 NOTE — Telephone Encounter (Signed)
Pharmacy left VM stating that Deplin tabs are not available but the capsule are.Please advise if it is ok to change to capsules.

## 2012-11-24 ENCOUNTER — Telehealth: Payer: Self-pay | Admitting: *Deleted

## 2012-11-24 NOTE — Telephone Encounter (Signed)
Okay to change to 15 mg half tablet daily if so desired

## 2012-11-24 NOTE — Telephone Encounter (Signed)
Pharmacy states that Pt advise them that she is suppose to be on 15 mg 1/2 tab daily but on our list Pt is suppose to be on 7.5 mg daily which equal in dose to what Pt is requesting. Please advise if Ok to change mg

## 2012-11-24 NOTE — Telephone Encounter (Signed)
Tried to return call office is closed will try again in AM

## 2012-11-25 NOTE — Telephone Encounter (Signed)
Verbal ok given to pharmacy

## 2012-12-06 ENCOUNTER — Other Ambulatory Visit: Payer: Self-pay

## 2013-02-17 ENCOUNTER — Ambulatory Visit (INDEPENDENT_AMBULATORY_CARE_PROVIDER_SITE_OTHER): Payer: Private Health Insurance - Indemnity | Admitting: Internal Medicine

## 2013-02-17 VITALS — BP 124/84 | HR 85 | Temp 98.2°F

## 2013-02-17 DIAGNOSIS — J329 Chronic sinusitis, unspecified: Secondary | ICD-10-CM

## 2013-02-17 DIAGNOSIS — I10 Essential (primary) hypertension: Secondary | ICD-10-CM

## 2013-02-17 MED ORDER — DOXYCYCLINE HYCLATE 100 MG PO TABS: 100.0000 mg | ORAL_TABLET | Freq: Two times a day (BID) | ORAL | Status: DC

## 2013-02-17 NOTE — Assessment & Plan Note (Signed)
BP well-controlled, due for a checkup in the new future.

## 2013-02-17 NOTE — Patient Instructions (Addendum)
Rest, fluids , tylenol, plain claritin For cough, take Mucinex DM twice a day as needed  For congestion use a saline nasal spray as needed and flonase. Take the antibiotic as prescribed  (doxycycline) Call if no better in few days Call anytime if the symptoms are severe

## 2013-02-17 NOTE — Progress Notes (Signed)
  Subjective:    Patient ID: Rebekah [name], female    DOB: 06-20-1961, 52 y.o.   MRN: [id number]  HPI Acute visit Upper respiratory symptoms for 4 weeks, initially felt to be allergies but now she's not sure. Symptoms are worse for the last 2 days: Facial congestion, cough at night, scratchy throat. She's taking Claritin-D, Mucinex and Flonase.  Past Medical History  Diagnosis Date  . GERD (gastroesophageal reflux disease)   . Hypertension   . Hyperlipidemia   . OSA (obstructive sleep apnea)     used a  CPAP before  . Obesity     Lap band surgery 12-08 Dr Daphine Deutscher  . Depression   . Allergy   . Sinus problem    Past Surgical History  Procedure Laterality Date  . Laparoscopic gastric banding      Lap band surgery 12-08 Dr Daphine Deutscher  . Cholecystectomy    . Appendectomy    . Nasal sinus surgery    . Breast reduction surgery      Review of Systems Denies fever or chills. No chest congestion. Very mild sneezing. Denies watery nose, no itchy eyes.     Objective:   Physical Exam BP 124/84  Pulse 85  Temp(Src) 98.2 F (36.8 C) (Oral)  SpO2 98%  General -- alert, well-developed, No apparent distress  HEENT -- TMs normal, throat w/o redness, face symmetric and   tender to palpation L>R , Nose moderately congested Lungs -- normal respiratory effort, no intercostal retractions, no accessory muscle use, and normal breath sounds.   Heart-- normal rate, regular rhythm, no murmur, and no gallop.   Neurologic-- alert & oriented X3 and strength normal in all extremities. Psych-- Cognition and judgment appear intact. Alert and cooperative with normal attention span and concentration.  not anxious appearing and not depressed appearing.       Assessment & Plan:  Sinusitis, Respiratory symptoms for 4 weeks, she is tender at the maxillary area. Suspect sinusitis. Allergic to penicillin and erythromycin. Plan:  Doxycycline Avoid decongestants, see instructions.

## 2013-02-18 ENCOUNTER — Encounter: Payer: Self-pay | Admitting: Internal Medicine

## 2013-03-12 ENCOUNTER — Encounter (INDEPENDENT_AMBULATORY_CARE_PROVIDER_SITE_OTHER): Payer: Managed Care, Other (non HMO)

## 2013-03-19 ENCOUNTER — Encounter (INDEPENDENT_AMBULATORY_CARE_PROVIDER_SITE_OTHER): Payer: Managed Care, Other (non HMO)

## 2013-03-25 ENCOUNTER — Telehealth: Payer: Self-pay | Admitting: Internal Medicine

## 2013-03-25 NOTE — Telephone Encounter (Signed)
Patient has cut her foot on a rusty nail. She thinks she is due for a tetanus shot. Please call her back to advise.

## 2013-03-25 NOTE — Telephone Encounter (Signed)
Spoke with pt, she denies & redness or swelling. Pt states that she does not need to see Dr. Drue Novel, she would just like a t-dap. Scheduled pt 6.5.14.

## 2013-03-26 ENCOUNTER — Ambulatory Visit (INDEPENDENT_AMBULATORY_CARE_PROVIDER_SITE_OTHER): Payer: Private Health Insurance - Indemnity | Admitting: General Practice

## 2013-03-26 DIAGNOSIS — Z23 Encounter for immunization: Secondary | ICD-10-CM

## 2013-04-06 ENCOUNTER — Other Ambulatory Visit: Payer: Self-pay | Admitting: Dermatology

## 2013-05-06 ENCOUNTER — Telehealth: Payer: Self-pay | Admitting: *Deleted

## 2013-05-06 NOTE — Telephone Encounter (Signed)
Pt would like a Rx for align so that it will be covered under her HSA.Pt notes that this med is expensive OTC.Please advise

## 2013-05-07 MED ORDER — ALIGN 4 MG PO CAPS: 1.0000 | ORAL_CAPSULE | Freq: Every day | ORAL | Status: DC

## 2013-05-07 NOTE — Telephone Encounter (Signed)
Discuss with patient, Rx mailed.

## 2013-05-07 NOTE — Telephone Encounter (Signed)
Ok Librarian, academic OTC 1 tab a day #30, 12 RF (or #90, 3 RF)

## 2013-05-21 ENCOUNTER — Encounter: Payer: Self-pay | Admitting: Internal Medicine

## 2013-05-21 ENCOUNTER — Ambulatory Visit (INDEPENDENT_AMBULATORY_CARE_PROVIDER_SITE_OTHER): Payer: Private Health Insurance - Indemnity | Admitting: Internal Medicine

## 2013-05-21 VITALS — BP 118/70 | HR 84 | Ht 64.0 in | Wt 186.0 lb

## 2013-05-21 DIAGNOSIS — G4733 Obstructive sleep apnea (adult) (pediatric): Secondary | ICD-10-CM

## 2013-05-21 DIAGNOSIS — J302 Other seasonal allergic rhinitis: Secondary | ICD-10-CM

## 2013-05-21 DIAGNOSIS — J309 Allergic rhinitis, unspecified: Secondary | ICD-10-CM

## 2013-05-21 NOTE — Patient Instructions (Addendum)
Order- DME re-establish CPAP, autotitrate 5-20 x 7 days for pressure recommendation, mask of choice, humidifier, supplies    Dx OSA  Please call as needed

## 2013-05-21 NOTE — Progress Notes (Signed)
05/21/13- 51 yoF never smoker Self Referral-had stopped using CPAP and told she had to go to sleep doctor for supplies and restart of machine; DME is [facility]. Sleep Study about 6-7 years ago at [facility], Hx Lap band bariatric surgery. NPSG 05/10/05- mild OSA- AHI 12.9/ hr, desaturation to 77%, weighed 219 lbs.  She has gained weight since last band surgery after getting down to 167 pounds and now of about 188 pounds. Husband tells her she is snoring loudly again. She feels unrested and wants retest replacement supplies for CPAP. Bedtime between 9 and 10 PM, short sleep latency, waking 2 or 3 times during the night before up at 6:30 AM. Treated for high blood pressure with no history of thyroid, heart or lung disease. Does have seasonal allergic rhinitis treated with fluticasone. Tonsillectomy twice and  sinus surgery.  Prior to Admission medications   Medication Sig Start Date End Date Taking? Authorizing Provider  aspirin 81 MG tablet Take 81 mg by mouth daily.     Yes Historical Provider, MD  atorvastatin (LIPITOR) 10 MG tablet Take 1 tablet (10 mg total) by mouth daily. 09/10/12  Yes Wanda Plump, MD  Calcium Carbonate Antacid (TUMS PO) Take 3 tablets by mouth 3 (three) times daily as needed. stomach    Yes Historical Provider, MD  fluticasone (FLONASE) 50 MCG/ACT nasal spray  09/01/12  Yes Historical Provider, MD  hyoscyamine (LEVSIN/SL) 0.125 MG SL tablet Place 1 tablet (0.125 mg total) under the tongue every 4 (four) hours as needed for cramping. 07/04/12 05/21/13 Yes Pecola Lawless, MD  L-Methylfolate (DEPLIN) 7.5 MG TABS 1 po qd 09/10/12  Yes Wanda Plump, MD  lisinopril (PRINIVIL,ZESTRIL) 10 MG tablet Take 1 tablet (10 mg total) by mouth daily. 09/10/12  Yes Wanda Plump, MD  Multiple Vitamin (MULTIVITAMIN) tablet Take 1 tablet by mouth daily.     Yes Historical Provider, MD  nortriptyline (PAMELOR) 75 MG capsule Take 1 capsule (75 mg total) by mouth daily. 09/10/12  Yes Wanda Plump, MD   ORSYTHIA 0.1-20 MG-MCG tablet Take 1 tablet by mouth daily.  07/10/11  Yes Historical Provider, MD  Probiotic Product (ALIGN) 4 MG CAPS Take 1 capsule by mouth daily. 05/07/13  Yes Wanda Plump, MD  ranitidine (ZANTAC) 150 MG tablet Take 150 mg by mouth 2 (two) times daily.   Yes Historical Provider, MD   Past Medical History  Diagnosis Date  . GERD (gastroesophageal reflux disease)   . Hypertension   . Hyperlipidemia   . OSA (obstructive sleep apnea)     used a  CPAP before  . Obesity     Lap band surgery 12-08 Dr Daphine Deutscher  . Depression   . Allergy   . Sinus problem    Past Surgical History  Procedure Laterality Date  . Laparoscopic gastric banding      Lap band surgery 12-08 Dr Daphine Deutscher  . Cholecystectomy    . Appendectomy    . Nasal sinus surgery    . Breast reduction surgery     Family History  Problem Relation Age of Onset  . Diabetes Neg Hx   . Colon cancer Neg Hx   . Breast cancer Neg Hx   . Coronary artery disease Father   . Heart disease Mother   . Diverticulitis Mother   . Prostate cancer Father   . Heart disease Brother    History   Social History  . Marital Status: Married    Spouse  Name: N/A    Number of Children: 0  . Years of Education: N/A   Occupational History  . [employer]   Social History Main Topics  . Smoking status: Never Smoker   . Smokeless tobacco: Never Used  . Alcohol Use: Yes     Comment: socially   . Drug Use: No  . Sexually Active: Not on file   Other Topics Concern  . Not on file   Social History Narrative   Exercise: sedentary --   Diet: not healthy, has gain wt ---   ROS-see HPI Constitutional:   No-   weight loss, night sweats, fevers, chills, +fatigue, lassitude. HEENT:   No-  headaches, difficulty swallowing, tooth/dental problems, sore throat,       No-  sneezing, itching, ear ache, +nasal congestion, post nasal drip,  CV:  No-   chest pain, orthopnea, PND, swelling in lower extremities, anasarca, dizziness,  palpitations Resp: No-   shortness of breath with exertion or at rest.              No-   productive cough,  No non-productive cough,  No- coughing up of blood.              No-   change in color of mucus.  No- wheezing.   Skin: No-   rash or lesions. GI:  +heartburn, indigestion, abdominal pain, nausea, vomiting, diarrhea,                 change in bowel habits, loss of appetite GU: No-   dysuria, change in color of urine, no urgency or frequency.  No- flank pain. MS:  No-   joint pain or swelling.  No- decreased range of motion.  No- back pain. Neuro-     nothing unusual Psych:  No- change in mood or affect. No depression or anxiety.  No memory loss.  OBJ- Physical Exam General- Alert, Oriented, Affect-appropriate, Distress- none acute, mildly overweight Skin- rash-none, lesions- none, excoriation- none Lymphadenopathy- none Head- atraumatic            Eyes- Gross vision intact, PERRLA, conjunctivae and secretions clear            Ears- Hearing, canals-normal            Nose- Clear, no-Septal dev, mucus, polyps, erosion, perforation             Throat- Mallampati III, mucosa clear , drainage- none, tonsils- atrophic Neck- flexible , trachea midline, no stridor , thyroid nl, carotid no bruit Chest - symmetrical excursion , unlabored           Heart/CV- RRR , no murmur , no gallop  , no rub, nl s1 s2                           - JVD- none , edema- none, stasis changes- none, varices- none           Lung- clear to P&A, wheeze- none, cough- none , dullness-none, rub- none           Chest wall-  Abd- tender-no, distended-no, bowel sounds-present, HSM- no Br/ Gen/ Rectal- Not done, not indicated Extrem- cyanosis- none, clubbing, none, atrophy- none, strength- nl Neuro- grossly intact to observation

## 2013-05-31 NOTE — Assessment & Plan Note (Signed)
Discussed control with fluticasone and antihistamines.

## 2013-05-31 NOTE — Assessment & Plan Note (Addendum)
Appropriate to reassess sleep apnea status- discussed the physiology, effect of her weight, sleep hygiene and driving responsibility.

## 2013-06-29 ENCOUNTER — Other Ambulatory Visit: Payer: Self-pay

## 2013-06-29 DIAGNOSIS — Z1231 Encounter for screening mammogram for malignant neoplasm of breast: Secondary | ICD-10-CM

## 2013-07-21 ENCOUNTER — Ambulatory Visit (INDEPENDENT_AMBULATORY_CARE_PROVIDER_SITE_OTHER): Payer: Private Health Insurance - Indemnity | Admitting: Internal Medicine

## 2013-07-21 ENCOUNTER — Encounter: Payer: Self-pay | Admitting: Internal Medicine

## 2013-07-21 VITALS — BP 104/62 | HR 97 | Ht 64.0 in | Wt 190.8 lb

## 2013-07-21 DIAGNOSIS — G4733 Obstructive sleep apnea (adult) (pediatric): Secondary | ICD-10-CM

## 2013-07-21 MED ORDER — PROTRIPTYLINE HCL 5 MG PO TABS: 5.0000 mg | ORAL_TABLET | Freq: Every day | ORAL | Status: DC

## 2013-07-21 NOTE — Patient Instructions (Signed)
Script sent for protriptyline/ Vivactil to see if it helps suppress night mares  We will call [facility] for result of latest download and your current CPAP setting.  Dx OSA

## 2013-07-21 NOTE — Progress Notes (Signed)
05/21/13- 51 yoF never smoker Self Referral-had stopped using CPAP and told she had to go to sleep doctor for supplies and restart of machine; DME is [facility]. Sleep Study about 6-7 years ago at [facility], Hx Lap band bariatric surgery. NPSG 05/10/05- mild OSA- AHI 12.9/ hr, desaturation to 77%, weighed 219 lbs.  She has gained weight since lap band surgery after getting down to 167 pounds and now of about 188 pounds. Husband tells her she is snoring loudly again. She feels unrested and wants retest replacement supplies for CPAP. Bedtime between 9 and 10 PM, short sleep latency, waking 2 or 3 times during the night before up at 6:30 AM. Treated for high blood pressure with no history of thyroid, heart or lung disease. Does have seasonal allergic rhinitis treated with fluticasone. Tonsillectomy twice and  sinus surgery.  07/21/13- 51 yoF never smoker Self Referral-had stopped using CPAP and told she had to go to sleep doctor for supplies and restart of machine; DME is [facility]. Sleep Study 2006 at [facility] [place], Hx Lap band bariatric surgery. Up to 190 lbs Pt reports having nightmares x 2 weeks-- wearing CPAP 6 / [facility] 6 days per week x 8 hrs per night-- denies any other concerns at this time. Husband is a IT sales professional. When he is on call at the station at night, she is afraid she would not hear an intruder with her CPAP on so she skips.  ROS-see HPI Constitutional:   No-   weight loss, night sweats, fevers, chills, +fatigue, lassitude. HEENT:   No-  headaches, difficulty swallowing, tooth/dental problems, sore throat,       No-  sneezing, itching, ear ache, +nasal congestion, post nasal drip,  CV:  No-   chest pain, orthopnea, PND, swelling in lower extremities, anasarca, dizziness, palpitations Resp: No-   shortness of breath with exertion or at rest.              No-   productive cough,  No non-productive cough,  No- coughing up of blood.              No-   change in color of mucus.  No-  wheezing.   Skin: No-   rash or lesions. GI:  +heartburn, indigestion, abdominal pain, nausea, vomiting,  GU: . MS:  No-   joint pain or swelling. . Neuro-     nothing unusual Psych:  No- change in mood or affect. No depression or anxiety.  No memory loss.  OBJ- Physical Exam General- Alert, Oriented, Affect-appropriate, Distress- none acute, mildly overweight Skin- rash-none, lesions- none, excoriation- none Lymphadenopathy- none Head- atraumatic            Eyes- Gross vision intact, PERRLA, conjunctivae and secretions clear            Ears- Hearing, canals-normal            Nose- Clear, no-Septal dev, mucus, polyps, erosion, perforation             Throat- Mallampati III, mucosa clear , drainage- none, tonsils- atrophic Neck- flexible , trachea midline, no stridor , thyroid nl, carotid no bruit Chest - symmetrical excursion , unlabored           Heart/CV- RRR , no murmur , no gallop  , no rub, nl s1 s2                           - JVD- none ,  edema- none, stasis changes- none, varices- none           Lung- clear to P&A, wheeze- none, cough- none , dullness-none, rub- none           Chest wall-  Abd-  Br/ Gen/ Rectal- Not done, not indicated Extrem- cyanosis- none, clubbing, none, atrophy- none, strength- nl Neuro- grossly intact to observation

## 2013-07-30 ENCOUNTER — Encounter (INDEPENDENT_AMBULATORY_CARE_PROVIDER_SITE_OTHER): Payer: Self-pay

## 2013-07-30 ENCOUNTER — Encounter: Payer: Self-pay | Admitting: Internal Medicine

## 2013-07-30 ENCOUNTER — Ambulatory Visit (INDEPENDENT_AMBULATORY_CARE_PROVIDER_SITE_OTHER): Payer: Managed Care, Other (non HMO) | Admitting: Physician Assistant

## 2013-07-30 VITALS — BP 126/84 | HR 92 | Resp 16 | Ht 64.0 in | Wt 190.4 lb

## 2013-07-30 DIAGNOSIS — Z4651 Encounter for fitting and adjustment of gastric lap band: Secondary | ICD-10-CM

## 2013-07-30 NOTE — Assessment & Plan Note (Signed)
We want to verify pressure setting with [facility] and the recent download We discussed her nightmares which may reflect anxiety. We are going to try Vivactil at night. This may suppress REM sleep stage. In the past it had been thought to have some therapeutic benefit with obstructive sleep apnea. She realizes it can be stimulating so we will see how she does with it.

## 2013-07-30 NOTE — Patient Instructions (Signed)

## 2013-07-30 NOTE — Progress Notes (Signed)
  HISTORY: SIAH KANNAN is a 52 y.o.female who received an AP-Standard lap-band in December 2008 by Dr. Daphine Deutscher. She comes in with complaints of increased hunger and portion sizes over the past six months. She describes drinking sodas now and making less than desirable food choices. We talked about coping strategies. Despite this, she feels a fill would help with the hunger.  VITAL SIGNS: Filed Vitals:   07/30/13 1446  BP: 126/84  Pulse: 92  Resp: 16    PHYSICAL EXAM: Physical exam reveals a very well-appearing 52 y.o.female in no apparent distress Neurologic: Awake, alert, oriented Psych: Bright affect, conversant Respiratory: Breathing even and unlabored. No stridor or wheezing Abdomen: Soft, nontender, nondistended to palpation. Incisions well-healed. No incisional hernias. Port easily palpated. Extremities: Atraumatic, good range of motion.  ASSESMENT: 52 y.o.  female  s/p AP-Standard lap-band.   PLAN: The patient's port was accessed with a 20G Huber needle without difficulty. Clear fluid was aspirated and 0.25 mL saline was added to the port. The patient was able to swallow water without difficulty following the procedure and was instructed to take clear liquids for the next 24-48 hours and advance slowly as tolerated.

## 2013-08-27 ENCOUNTER — Encounter (INDEPENDENT_AMBULATORY_CARE_PROVIDER_SITE_OTHER): Payer: Self-pay

## 2013-08-27 ENCOUNTER — Encounter (INDEPENDENT_AMBULATORY_CARE_PROVIDER_SITE_OTHER): Payer: Managed Care, Other (non HMO)

## 2013-08-27 ENCOUNTER — Ambulatory Visit (INDEPENDENT_AMBULATORY_CARE_PROVIDER_SITE_OTHER): Payer: Managed Care, Other (non HMO) | Admitting: Physician Assistant

## 2013-08-27 VITALS — BP 124/84 | HR 88 | Temp 98.3°F | Resp 15 | Ht 64.0 in | Wt 186.2 lb

## 2013-08-27 DIAGNOSIS — Z9884 Bariatric surgery status: Secondary | ICD-10-CM

## 2013-08-27 NOTE — Progress Notes (Signed)
  HISTORY: Rebekah [name] is a 52 y.o.female who received an AP-Standard lap-band in November 2008 by Dr. Daphine Deutscher. She comes in with 4 lbs weight loss since her last visit one month ago. She describes reflux necessitating zantac and tums. These alleviate her symptoms. She is able to take solids with little difficulty and she often feels like she's eating more than desired.Marland Kitchen  VITAL SIGNS: Filed Vitals:   08/27/13 1455  BP: 124/84  Pulse: 88  Temp: 98.3 F (36.8 C)  Resp: 15    PHYSICAL EXAM: Physical exam reveals a very well-appearing 51 y.o.female in no apparent distress Neurologic: Awake, alert, oriented Psych: Bright affect, conversant Respiratory: Breathing even and unlabored. No stridor or wheezing Extremities: Atraumatic, good range of motion. Skin: Warm, Dry, no rashes Musculoskeletal: Normal gait, Joints normal  ASSESMENT: 52 y.o.  female  s/p AP-Standard lap-band.   PLAN: I've recommended that she change to a PPI to manage her GERD. She sounds like she's in the green zone otherwise. If she still has symptoms next month we'll remove fluid.

## 2013-08-27 NOTE — Patient Instructions (Signed)
Return in one month. Focus on good food choices as well as physical activity. Return sooner if you have an increase in hunger, portion sizes or weight. Return also for difficulty swallowing, night cough, reflux.   

## 2013-09-02 ENCOUNTER — Telehealth: Payer: Self-pay

## 2013-09-02 NOTE — Telephone Encounter (Signed)
Medication and allergies: updated and reviewed  90 day supply/mail order: [employer] mail order Local pharmacy: [facility] [place] Rebekah [name]   Immunizations due:  UTD  A/P:   No changes to FH or PSH Flu Vaccine at work--07/2013 Pap--08/2009--neg--appt 09/04/2013 with gyn [facility]G--08/2012---neg---appt 09/04/2013 CCS--08/2012--adenomatous polyp--Dr Stark--next 08/2017 Tdap--03/2013  To Discuss with Provider: Needs Rx for aspirin 81mg , Probiotics and multivitamin for health care savings plan

## 2013-09-04 ENCOUNTER — Ambulatory Visit
Admission: RE | Admit: 2013-09-04 | Discharge: 2013-09-04 | Disposition: A | Discharge status: Home / Self Care | Payer: Private Health Insurance - Indemnity | Source: Ambulatory Visit

## 2013-09-04 ENCOUNTER — Encounter: Payer: Self-pay | Admitting: Internal Medicine

## 2013-09-04 ENCOUNTER — Ambulatory Visit (INDEPENDENT_AMBULATORY_CARE_PROVIDER_SITE_OTHER): Payer: Private Health Insurance - Indemnity | Admitting: Internal Medicine

## 2013-09-04 VITALS — BP 129/83 | HR 74 | Temp 98.0°F | Ht 65.0 in | Wt 185.0 lb

## 2013-09-04 DIAGNOSIS — Z1231 Encounter for screening mammogram for malignant neoplasm of breast: Secondary | ICD-10-CM

## 2013-09-04 DIAGNOSIS — Z0000 Encounter for general adult medical examination without abnormal findings: Secondary | ICD-10-CM

## 2013-09-04 LAB — CBC WITH DIFFERENTIAL/PLATELET
Basophils Absolute: 0 10*3/uL (ref 0.0–0.1)
Eosinophils Absolute: 0.1 10*3/uL (ref 0.0–0.7)
Eosinophils Relative: 1.1 % (ref 0.0–5.0)
HCT: 37.8 % (ref 36.0–46.0)
Lymphs Abs: 3 10*3/uL (ref 0.7–4.0)
Monocytes Relative: 4.8 % (ref 3.0–12.0)
Neutrophils Relative %: 61.5 % (ref 43.0–77.0)
Platelets: 328 10*3/uL (ref 150.0–400.0)
RDW: 14.7 % — ABNORMAL HIGH (ref 11.5–14.6)
WBC: 9.4 10*3/uL (ref 4.5–10.5)

## 2013-09-04 LAB — COMPREHENSIVE METABOLIC PANEL
ALT: 32 U/L (ref 0–35)
AST: 21 U/L (ref 0–37)
Albumin: 3.8 g/dL (ref 3.5–5.2)
Alkaline Phosphatase: 61 U/L (ref 39–117)
CO2: 27 mEq/L (ref 19–32)
Calcium: 9.8 mg/dL (ref 8.4–10.5)
Chloride: 104 mEq/L (ref 96–112)
GFR: 64.34 mL/min (ref >60.00)
Glucose, Bld: 72 mg/dL (ref 70–99)
Potassium: 4.3 mEq/L (ref 3.5–5.1)
Sodium: 136 mEq/L (ref 135–145)
Total Bilirubin: 0.6 mg/dL (ref 0.3–1.2)
Total Protein: 7.5 g/dL (ref 6.0–8.3)

## 2013-09-04 LAB — TSH: TSH: 0.88 u[IU]/mL (ref 0.35–5.50)

## 2013-09-04 LAB — HEMOGLOBIN A1C: Hgb A1c MFr Bld: 5.9 % (ref 4.6–6.5)

## 2013-09-04 MED ORDER — LISINOPRIL 10 MG PO TABS: 10.0000 mg | ORAL_TABLET | Freq: Every day | ORAL | Status: DC

## 2013-09-04 MED ORDER — ONE-DAILY MULTI VITAMINS PO TABS: 1.0000 | ORAL_TABLET | Freq: Every day | ORAL | Status: DC

## 2013-09-04 MED ORDER — NORTRIPTYLINE HCL 75 MG PO CAPS: 75.0000 mg | ORAL_CAPSULE | Freq: Every day | ORAL | Status: DC

## 2013-09-04 MED ORDER — ATORVASTATIN CALCIUM 10 MG PO TABS: 10.0000 mg | ORAL_TABLET | Freq: Every day | ORAL | Status: DC

## 2013-09-04 MED ORDER — ASPIRIN 81 MG PO TABS: 81.0000 mg | ORAL_TABLET | Freq: Every day | ORAL | Status: DC

## 2013-09-04 MED ORDER — ALIGN 4 MG PO CAPS: 1.0000 | ORAL_CAPSULE | Freq: Every day | ORAL | Status: DC | PRN

## 2013-09-04 MED ORDER — OMEPRAZOLE 40 MG PO CPDR: 40.0000 mg | DELAYED_RELEASE_CAPSULE | Freq: Every day | ORAL | Status: DC

## 2013-09-04 MED ORDER — DEPLIN 7.5 MG PO TABS: ORAL_TABLET | ORAL | Status: DC

## 2013-09-04 NOTE — Progress Notes (Signed)
  Subjective:    Patient ID: Rebekah [name], female    DOB: [birth year], 52 y.o.   MRN: [id number]  HPI CPX  Past Medical History  Diagnosis Date  . GERD (gastroesophageal reflux disease)   . Hypertension   . Hyperlipidemia   . OSA (obstructive sleep apnea)     using a CPAP    . Obesity     Lap band surgery 12-08 Dr Daphine Deutscher  . Depression   . Allergy    Past Surgical History  Procedure Laterality Date  . Laparoscopic gastric banding      Lap band surgery 12-08 Dr Daphine Deutscher  . Cholecystectomy    . Appendectomy    . Nasal sinus surgery    . Breast reduction surgery     History   Social History  . Marital Status: Married    Spouse Name: N/A    Number of Children: 0  . Years of Education: N/A   Occupational History  . [employer]   Social History Main Topics  . Smoking status: Never Smoker   . Smokeless tobacco: Never Used  . Alcohol Use: Yes     Comment: socially   . Drug Use: No  . Sexual Activity: Not on file   Other Topics Concern  . Not on file   Social History Narrative  . No narrative on file   Family History  Problem Relation Age of Onset  . Diabetes Neg Hx   . Colon cancer Neg Hx   . Breast cancer Neg Hx   . Coronary artery disease Father     CABG, smoker .age onset 51s  . Heart disease Mother     CABG, smoker .age onset 71s  . Diverticulitis Mother   . Prostate cancer Father     Review of Systems Diet-- good days and bad days Exercise-- busy but no exercise per se No  CP, SOB, lower extremity edema enies  nausea, vomiting diarrhea Denies  blood in the stools + GERD sx at night  No dysuria, gross hematuria, difficulty urinating   Some anxiety lately, irritable,menopause? Plans to discuss w/ gyn (on BCP) No depression      Objective:   Physical Exam BP 129/83  Pulse 74  Temp(Src) 98 F (36.7 C)  Ht 5\' 5"  (1.651 m)  Wt 185 lb (83.915 kg)  BMI 30.79 kg/m2  SpO2 99% General -- alert, well-developed, NAD.  Neck --no thyromegaly Lungs --  normal respiratory effort, no intercostal retractions, no accessory muscle use, and normal breath sounds.  Heart-- normal rate, regular rhythm, no murmur.  Abdomen-- Not distended, good bowel sounds,soft, mild diffuse-tenderness w/o mas or rebound (chronic finding). Extremities-- no pretibial edema bilaterally  Neurologic--  alert & oriented X3. Speech normal, gait normal, strength normal in all extremities.  Psych-- Cognition and judgment appear intact. Cooperative with normal attention span and concentration. No anxious appearing , no depressed appearing.     Assessment & Plan:

## 2013-09-04 NOTE — Progress Notes (Signed)
Pre visit review using our clinic review tool, if applicable. No additional management support is needed unless otherwise documented below in the visit note. 

## 2013-09-04 NOTE — Patient Instructions (Signed)
Get your blood work before you leave  Next visit in 6 months  for a   follow up : HTN, cholesterol .No Fasting Please make an appointment    Omeprazole 40 mg before breakfast, ranitidine  at night Call if acid reflux is not well-controlled      Diet for Gastroesophageal Reflux Disease, Adult Reflux (acid reflux) is when acid from your stomach flows up into the esophagus. When acid comes in contact with the esophagus, the acid causes irritation and soreness (inflammation) in the esophagus. When reflux happens often or so severely that it causes damage to the esophagus, it is called gastroesophageal reflux disease (GERD). Nutrition therapy can help ease the discomfort of GERD. FOODS OR DRINKS TO AVOID OR LIMIT  Smoking or chewing tobacco. Nicotine is one of the most potent stimulants to acid production in the gastrointestinal tract.  Caffeinated and decaffeinated coffee and black tea.  Regular or low-calorie carbonated beverages or energy drinks (caffeine-free carbonated beverages are allowed).   Strong spices, such as black pepper, white pepper, red pepper, cayenne, curry powder, and chili powder.  Peppermint or spearmint.  Chocolate.  High-fat foods, including meats and fried foods. Extra added fats including oils, butter, salad dressings, and nuts. Limit these to less than 8 tsp per day.  Fruits and vegetables if they are not tolerated, such as citrus fruits or tomatoes.  Alcohol.  Any food that seems to aggravate your condition. If you have questions regarding your diet, call your caregiver or a registered dietitian. OTHER THINGS THAT MAY HELP GERD INCLUDE:   Eating your meals slowly, in a relaxed setting.  Eating 5 to 6 small meals per day instead of 3 large meals.  Eliminating food for a period of time if it causes distress.  Not lying down until 3 hours after eating a meal.  Keeping the head of your bed raised 6 to 9 inches (15 to 23 cm) by using a foam wedge or  blocks under the legs of the bed. Lying flat may make symptoms worse.  Being physically active. Weight loss may be helpful in reducing reflux in overweight or obese adults.  Wear loose fitting clothing EXAMPLE MEAL PLAN This meal plan is approximately 2,000 calories based on https://www.bernard.org/ meal planning guidelines. Breakfast   cup cooked oatmeal.  1 cup strawberries.  1 cup low-fat milk.  1 oz almonds. Snack  1 cup cucumber slices.  6 oz yogurt (made from low-fat or fat-free milk). Lunch  2 slice whole-wheat bread.  2 oz sliced [place].  2 tsp mayonnaise.  1 cup blueberries.  1 cup snap peas. Snack  6 whole-wheat crackers.  1 oz string cheese. Dinner   cup brown rice.  1 cup mixed veggies.  1 tsp olive oil.  3 oz grilled fish. Document Released: 10/08/2005 Document Revised: 12/31/2011 Document Reviewed: 08/24/2011 [facility] Patient Information 2014 [place], [place].

## 2013-09-04 NOTE — Assessment & Plan Note (Addendum)
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 Other issues: Hyperlipidemia well controlled Depression well-controlled, some anxiety, recommend counseling GERD, symptoms at night, recommend omeprazole before breakfast and continue trying to him at night. Diet discussed

## 2013-09-21 ENCOUNTER — Telehealth: Payer: Self-pay | Admitting: Internal Medicine

## 2013-09-21 ENCOUNTER — Other Ambulatory Visit: Payer: Self-pay | Admitting: *Deleted

## 2013-09-21 MED ORDER — NORTRIPTYLINE HCL 75 MG PO CAPS: 75.0000 mg | ORAL_CAPSULE | Freq: Every day | ORAL | Status: DC

## 2013-09-21 MED ORDER — LISINOPRIL 10 MG PO TABS: 10.0000 mg | ORAL_TABLET | Freq: Every day | ORAL | Status: DC

## 2013-09-21 MED ORDER — ATORVASTATIN CALCIUM 10 MG PO TABS: 10.0000 mg | ORAL_TABLET | Freq: Every day | ORAL | Status: DC

## 2013-09-21 MED ORDER — DEPLIN 7.5 MG PO TABS: ORAL_TABLET | ORAL | Status: DC

## 2013-09-21 MED ORDER — OMEPRAZOLE 40 MG PO CPDR: 40.0000 mg | DELAYED_RELEASE_CAPSULE | Freq: Every day | ORAL | Status: DC

## 2013-09-21 NOTE — Telephone Encounter (Signed)
Patient came in on 11/14 and all of her meds were sent to [facility]. Patient states that she needs these sent to [facility] instead.

## 2013-09-21 NOTE — Telephone Encounter (Signed)
Medications sent to [facility]

## 2013-10-01 ENCOUNTER — Encounter (INDEPENDENT_AMBULATORY_CARE_PROVIDER_SITE_OTHER): Payer: Managed Care, Other (non HMO)

## 2013-10-07 ENCOUNTER — Other Ambulatory Visit: Payer: Self-pay | Admitting: *Deleted

## 2013-10-07 MED ORDER — ATORVASTATIN CALCIUM 10 MG PO TABS: 10.0000 mg | ORAL_TABLET | Freq: Every day | ORAL | Status: DC

## 2013-10-07 MED ORDER — FLUTICASONE PROPIONATE 50 MCG/ACT NA SUSP: 1.0000 | NASAL | Status: DC | PRN

## 2013-10-09 ENCOUNTER — Other Ambulatory Visit: Payer: Self-pay | Admitting: *Deleted

## 2013-10-20 ENCOUNTER — Ambulatory Visit: Payer: Private Health Insurance - Indemnity | Admitting: Internal Medicine

## 2013-11-17 ENCOUNTER — Other Ambulatory Visit: Payer: Self-pay | Admitting: *Deleted

## 2013-11-17 ENCOUNTER — Telehealth: Payer: Self-pay | Admitting: *Deleted

## 2013-11-17 MED ORDER — FLUTICASONE PROPIONATE 50 MCG/ACT NA SUSP: 1.0000 | NASAL | Status: DC | PRN

## 2013-11-17 NOTE — Telephone Encounter (Signed)
Patient called and stated that her mail order pharmacy need [name] to call them to refill her fluticasone (FLONASE) 50 MCG/ACT nasal spray.  Ph[facility] [place]

## 2013-11-17 NOTE — Telephone Encounter (Signed)
Medication e-scribed to pharmacy. JG//CMA 

## 2013-11-23 ENCOUNTER — Encounter: Payer: Self-pay | Admitting: Internal Medicine

## 2013-11-24 ENCOUNTER — Encounter: Payer: Self-pay | Admitting: Internal Medicine

## 2013-11-24 ENCOUNTER — Ambulatory Visit (INDEPENDENT_AMBULATORY_CARE_PROVIDER_SITE_OTHER): Payer: Private Health Insurance - Indemnity | Admitting: Internal Medicine

## 2013-11-24 VITALS — BP 122/80 | HR 86 | Temp 98.7°F | Wt 183.0 lb

## 2013-11-24 DIAGNOSIS — R05 Cough: Secondary | ICD-10-CM

## 2013-11-24 DIAGNOSIS — R059 Cough, unspecified: Secondary | ICD-10-CM

## 2013-11-24 MED ORDER — HYDROCOD POLST-CHLORPHEN POLST 10-8 MG/5ML PO LQCR: 5.0000 mL | Freq: Every evening | ORAL | Status: DC | PRN

## 2013-11-24 MED ORDER — DOXYCYCLINE HYCLATE 100 MG PO TABS: 100.0000 mg | ORAL_TABLET | Freq: Two times a day (BID) | ORAL | Status: DC

## 2013-11-24 NOTE — Patient Instructions (Signed)
Rest, fluids , tylenol For cough, take Mucinex DM twice a day as needed  For persistent cough use tussionex at night, will cause drowsiness  For congestion use OTC Nasocort: 2 nasal sprays on each side of the nose daily until you feel better  Take the antibiotic as prescribed  (doxy) Call if no better in few days Call anytime if the symptoms are severe

## 2013-11-24 NOTE — Progress Notes (Signed)
Pre visit review using our clinic review tool, if applicable. No additional management support is needed unless otherwise documented below in the visit note. 

## 2013-11-24 NOTE — Progress Notes (Signed)
   Subjective:    Patient ID: Rebekah [name], female    DOB: 1961-07-03, 53 y.o.   MRN: [id number]  HPI Acute visit Patient has been sick for 10 days: Persisting cough, mostly dry, occasionally sees sputum in the morning. OTCs are not helping much. She developed a left-sided ear ache the last 2 days and has some headaches from cough. She feels tired.  Past Medical History  Diagnosis Date  . GERD (gastroesophageal reflux disease)   . Hypertension   . Hyperlipidemia   . OSA (obstructive sleep apnea)     using a CPAP    . Obesity     Lap band surgery 12-08 Dr Hassell Done  . Depression   . Allergy    Past Surgical History  Procedure Laterality Date  . Laparoscopic gastric banding      Lap band surgery 12-08 Dr Hassell Done  . Cholecystectomy    . Appendectomy    . Nasal sinus surgery    . Breast reduction surgery       Review of Systems Denies fevers,? Chills. No  nausea, vomiting, diarrhea. No myalgias. No sinus congestion. + Sick contacts, many people around her are sick with similar symptoms.     Objective:   Physical Exam  BP 122/80  Pulse 86  Temp(Src) 98.7 F (37.1 C)  Wt 183 lb (83.008 kg)  SpO2 99% General -- alert, well-developed, NAD. Persistent cough noted HEENT-- Not pale. TMs slt bulge B, L>R no red or d/c; throat symmetric, no redness or discharge. Face symmetric, sinuses not tender to palpation. Nose slt congested. Lungs -- normal respiratory effort, no intercostal retractions, no accessory muscle use, and normal breath sounds.  Heart-- normal rate, regular rhythm, no murmur.  Neurologic--  alert & oriented X3. Speech normal, gait normal, strength normal in all extremities.  Psych-- Cognition and judgment appear intact. Cooperative with normal attention span and concentration. No anxious or depressed appearing.     Assessment & Plan:  Cough,  Persisting cough for 9 days, likely bronchitis. Plan: see  instructions

## 2014-01-20 ENCOUNTER — Other Ambulatory Visit: Payer: Self-pay | Admitting: Internal Medicine

## 2014-03-22 ENCOUNTER — Encounter: Payer: Self-pay | Admitting: Internal Medicine

## 2014-03-22 ENCOUNTER — Ambulatory Visit (INDEPENDENT_AMBULATORY_CARE_PROVIDER_SITE_OTHER): Payer: Private Health Insurance - Indemnity | Admitting: Internal Medicine

## 2014-03-22 VITALS — BP 100/66 | HR 88 | Temp 97.9°F | Wt 192.2 lb

## 2014-03-22 DIAGNOSIS — E785 Hyperlipidemia, unspecified: Secondary | ICD-10-CM

## 2014-03-22 DIAGNOSIS — I10 Essential (primary) hypertension: Secondary | ICD-10-CM

## 2014-03-22 DIAGNOSIS — E349 Endocrine disorder, unspecified: Secondary | ICD-10-CM

## 2014-03-22 LAB — LIPID PANEL
CHOL/HDL RATIO: 3
Cholesterol: 142 mg/dL (ref 0–200)
HDL: 47.1 mg/dL (ref >39.00)
LDL CALC: 79 mg/dL (ref 0–99)
Triglycerides: 78 mg/dL (ref 0.0–149.0)
VLDL: 15.6 mg/dL (ref 0.0–40.0)

## 2014-03-22 LAB — BASIC METABOLIC PANEL
BUN: 15 mg/dL (ref 6–23)
CALCIUM: 9.1 mg/dL (ref 8.4–10.5)
CO2: 24 mEq/L (ref 19–32)
Chloride: 107 mEq/L (ref 96–112)
Creatinine, Ser: 1.1 mg/dL (ref 0.4–1.2)
GFR: 57.32 mL/min — AB (ref >60.00)
Glucose, Bld: 73 mg/dL (ref 70–99)
POTASSIUM: 3.9 meq/L (ref 3.5–5.1)
Sodium: 138 mEq/L (ref 135–145)

## 2014-03-22 LAB — ALT: ALT: 18 U/L (ref 0–35)

## 2014-03-22 LAB — AST: AST: 20 U/L (ref 0–37)

## 2014-03-22 NOTE — Progress Notes (Signed)
Pre visit review using our clinic review tool, if applicable. No additional management support is needed unless otherwise documented below in the visit note. 

## 2014-03-22 NOTE — Assessment & Plan Note (Signed)
BP slightly low today but she feels great ; ambulatory BPs usually 120. Plan: Labs, continue present care

## 2014-03-22 NOTE — Progress Notes (Signed)
Subjective:    Patient ID: Rebekah [name], female    DOB: 05-01-1961, 53 y.o.   MRN: [id number]  DOS:  03/22/2014 Type of  Visit: ROV History: Good compliance of medication. SBP today is a slightly low but usually is 110 at home. she feels great this morning. GERD symptoms well controlled Came back from [place] a few days ago, has a cold: Runny nose, sore throat. Denies fever chills, mild cough.   ROS Still having issues with diet, request a nutritionist referral No chest pain, difficulty breathing No  nausea, vomiting, diarrhea  Past Medical History  Diagnosis Date  . GERD (gastroesophageal reflux disease)   . Hypertension   . Hyperlipidemia   . OSA (obstructive sleep apnea)     using a CPAP    . Obesity     Lap band surgery 12-08 Dr Hassell Done  . Depression   . Allergy     Past Surgical History  Procedure Laterality Date  . Laparoscopic gastric banding      Lap band surgery 12-08 Dr Hassell Done  . Cholecystectomy    . Appendectomy    . Nasal sinus surgery    . Breast reduction surgery      History   Social History  . Marital Status: Married    Spouse Name: N/A    Number of Children: 0  . Years of Education: N/A   Occupational History  . [employer]   Social History Main Topics  . Smoking status: Never Smoker   . Smokeless tobacco: Never Used  . Alcohol Use: Yes     Comment: socially   . Drug Use: No  . Sexual Activity: Not on file   Other Topics Concern  . Not on file   Social History Narrative  . No narrative on file        Medication List       This list is accurate as of: 03/22/14 10:22 AM.  Always use your most recent med list.               ALIGN 4 MG Caps  Take 1 capsule by mouth daily as needed.     aspirin 81 MG tablet  Take 1 tablet (81 mg total) by mouth daily.     atorvastatin 10 MG tablet  Commonly known as:  LIPITOR  Take 1 tablet (10 mg total) by mouth daily.     DEPLIN 15 MG Tabs  Take one-half tablet by mouth one time  daily     fluticasone 50 MCG/ACT nasal spray  Commonly known as:  FLONASE  Place 1 spray into both nostrils as needed for allergies or rhinitis.     lisinopril 10 MG tablet  Commonly known as:  PRINIVIL,ZESTRIL  Take 1 tablet (10 mg total) by mouth daily.     multivitamin tablet  Take 1 tablet by mouth daily.     nortriptyline 75 MG capsule  Commonly known as:  PAMELOR  Take 1 capsule (75 mg total) by mouth daily.     omeprazole 40 MG capsule  Commonly known as:  PRILOSEC  Take 1 capsule (40 mg total) by mouth daily.     ranitidine 150 MG tablet  Commonly known as:  ZANTAC  Take 150 mg by mouth 2 (two) times daily.     TUMS PO  Take 3 tablets by mouth 3 (three) times daily as needed. stomach           Objective:   Physical  Exam BP 100/66  Pulse 88  Temp(Src) 97.9 F (36.6 C)  Wt 192 lb 3.2 oz (87.181 kg)  SpO2 99%  General -- alert, well-developed, NAD.  HEENT-- Not pale. TMs normal, throat symmetric, no redness or discharge. Face symmetric, sinuses not tender to palpation. Nose slt congested.  Lungs -- normal respiratory effort, no intercostal retractions, no accessory muscle use, and normal breath sounds.  Heart-- normal rate, regular rhythm, no murmur.  Extremities-- no pretibial edema bilaterally  Neurologic--  alert & oriented X3. Speech normal, gait appropriate for age, strength symmetric and appropriate for age.   Psych-- Cognition and judgment appear intact. Cooperative with normal attention span and concentration. No anxious or depressed appearing.       Assessment & Plan:   URI-- rec OTCs, call if no better in few days. Abx?

## 2014-03-22 NOTE — Assessment & Plan Note (Signed)
Due for labs

## 2014-03-22 NOTE — Assessment & Plan Note (Signed)
Refer to a nutritionist Also rec calorie counting apps such as myfitnesspal

## 2014-03-22 NOTE — Patient Instructions (Signed)
Get your blood work before you leave   Next visit is for a physical exam by 08-2014, fasting Please make an appointment     At some point this year the clinic will relocate to  [facility] [place],  [address] (10 minutes form here)  [address]  [place], [place] [address] [phone]   

## 2014-03-23 ENCOUNTER — Telehealth: Payer: Self-pay | Admitting: Internal Medicine

## 2014-03-23 NOTE — Telephone Encounter (Signed)
Relevant patient education assigned to patient using Emmi. ° °

## 2014-04-07 ENCOUNTER — Other Ambulatory Visit: Payer: Self-pay | Admitting: Internal Medicine

## 2014-04-07 ENCOUNTER — Encounter: Payer: Private Health Insurance - Indemnity | Attending: Internal Medicine | Admitting: Nutrition

## 2014-04-07 VITALS — Ht 64.0 in | Wt 192.4 lb

## 2014-04-07 DIAGNOSIS — E349 Endocrine disorder, unspecified: Secondary | ICD-10-CM

## 2014-04-07 DIAGNOSIS — Z9884 Bariatric surgery status: Secondary | ICD-10-CM | POA: Diagnosis not present

## 2014-04-07 DIAGNOSIS — Z713 Dietary counseling and surveillance: Secondary | ICD-10-CM | POA: Diagnosis not present

## 2014-04-07 NOTE — Patient Instructions (Signed)
See Progress note

## 2014-04-07 NOTE — Progress Notes (Signed)
This patient is here today because Dr. Larose Kells thought she needed a diet consult for possible prediabetes.  She is a one year post lap band surgery and lost 70 pounds.  She has since gained 30 pounds back again.   Her HgbA1C has come back in the normal range, but mother has diabetes.  She is wanting to get this weight back off.  Weight gain has come from drinking sweet drinks (3 at least per day), deserts daily, and very little exercise. In the last 2 weeks, she has stopped all sweet drinks,and deserts, and no exercise.  She is a single mom with 2 teenage boys  Her meals are not balanced, low in carbs, high in fat.  She says that she is feeling tired and weak during the day, or after her workout at the gym..   Diet:          8:AM:  Cheese grits, or 4 crackers with pimento cheese spread, or regular cheese (1 1/2-2 ounces).  Weekends: cold cereal and milk, or eggs with bacon.       10AM: 4 more crackers, or humus and vegetables, or some leftovers from the night before.       12 PM:  Homemade vegetable soups with little to no protein, but 1/2 and 1/2 cream         2PM: candy- 2-3 small chocolate bars, and bowl of almonds--1/2-1 cup.         6PM:  Meat 4-6 ounces, 1 serving of carbs in the form of starchy veg. Or small amount of brown rice., occasional pasta and bread Pt. Is drinking water now, or Crystal lite lemonade.  Denies alcohol.  SBGM:  FBSs for last 5 days:  122-97, 2hr. PcB: 97-104,  AcL: 100, 2hr. PcL: low 90s, acS: less than 100.  She had one reading in the 200, which she believes was due to cereal breakfast.  She did not test after this meal.    Plan: Discussed the idea of balanced meals, and she was given a list of carb choices and protein choices.   Breakfast needs more protein--at least one ounce.  Stop pimento cheese crackers.  Other suggestions given to her for lower fat protein choices for the breakfast meal, and mid morning snack.  Also discussed the idea of lower fat choices for protein  and limiting the amount of fat/serving sizes in salad dressings and the amount she uses to cook with.   She was given other choices to use in place of chocolate candies in the mid afternoon to snack on, and told to limit the nuts to no more than 1/4 cup in the late afternoon.

## 2014-04-08 ENCOUNTER — Other Ambulatory Visit: Payer: Self-pay | Admitting: Dermatology

## 2014-05-10 ENCOUNTER — Encounter: Payer: Private Health Insurance - Indemnity | Admitting: Nutrition

## 2014-06-22 ENCOUNTER — Other Ambulatory Visit: Payer: Self-pay

## 2014-06-22 DIAGNOSIS — Z1231 Encounter for screening mammogram for malignant neoplasm of breast: Secondary | ICD-10-CM

## 2014-07-09 ENCOUNTER — Encounter: Payer: Self-pay | Admitting: Internal Medicine

## 2014-07-09 ENCOUNTER — Ambulatory Visit (INDEPENDENT_AMBULATORY_CARE_PROVIDER_SITE_OTHER): Payer: Private Health Insurance - Indemnity | Admitting: Internal Medicine

## 2014-07-09 VITALS — BP 110/62 | HR 92 | Temp 98.3°F

## 2014-07-09 DIAGNOSIS — F411 Generalized anxiety disorder: Secondary | ICD-10-CM

## 2014-07-09 DIAGNOSIS — F329 Major depressive disorder, single episode, unspecified: Secondary | ICD-10-CM

## 2014-07-09 DIAGNOSIS — F3289 Other specified depressive episodes: Secondary | ICD-10-CM

## 2014-07-09 MED ORDER — CLONAZEPAM 0.5 MG PO TABS: 0.5000 mg | ORAL_TABLET | Freq: Two times a day (BID) | ORAL | Status: DC | PRN

## 2014-07-09 NOTE — Progress Notes (Signed)
Pre visit review using our clinic review tool, if applicable. No additional management support is needed unless otherwise documented below in the visit note. 

## 2014-07-09 NOTE — Progress Notes (Signed)
Subjective:    Patient ID: Rebekah [name], female    DOB: [birth year], 53 y.o.   MRN: [id number]  DOS:  07/09/2014 Type of visit - description : acute Interval history: Not feeling well emotionally for the last 5 months: Irritable and angry all day long, "evil to everybody". She thinks is related to the severely increased stress at work. I asked about depression, she has been depressed all her life but that is relatively well controlled.   ROS Sleeps well, approximately 7-8 hours a day. Not taking any new medications No violent thoughts, no suicidal ideas although she has done superficial cuts on her leg (not a  new thing to her )   Past Medical History  Diagnosis Date  . GERD (gastroesophageal reflux disease)   . Hypertension   . Hyperlipidemia   . OSA (obstructive sleep apnea)     using a CPAP    . Obesity     Lap band surgery 12-08 Dr Hassell Done  . Depression   . Allergy     Past Surgical History  Procedure Laterality Date  . Laparoscopic gastric banding      Lap band surgery 12-08 Dr Hassell Done  . Cholecystectomy    . Appendectomy    . Nasal sinus surgery    . Breast reduction surgery      History   Social History  . Marital Status: Married    Spouse Name: N/A    Number of Children: 0  . Years of Education: N/A   Occupational History  . [employer] History Main Topics  . Smoking status: Never Smoker   . Smokeless tobacco: Never Used  . Alcohol Use: Yes     Comment: socially   . Drug Use: No  . Sexual Activity: Not on file   Other Topics Concern  . Not on file   Social History Narrative   Lives w/ husband        Medication List       This list is accurate as of: 07/09/14 11:59 PM.  Always use your most recent med list.               ALIGN 4 MG Caps  Take 1 capsule by mouth daily as needed (gummy).     aspirin 81 MG tablet  Take 1 tablet (81 mg total) by mouth daily.     atorvastatin 10 MG tablet  Commonly known as:   LIPITOR  Take 1 tablet (10 mg total) by mouth daily.     clonazePAM 0.5 MG tablet  Commonly known as:  KLONOPIN  Take 1 tablet (0.5 mg total) by mouth 2 (two) times daily as needed for anxiety.     DEPLIN 15 MG Tabs  Take one-half tablet by mouth one time daily     lisinopril 10 MG tablet  Commonly known as:  PRINIVIL,ZESTRIL  Take 1 tablet (10 mg total) by mouth daily.     multivitamin tablet  Take 1 tablet by mouth daily.     NON FORMULARY  BCP per gyn     nortriptyline 75 MG capsule  Commonly known as:  PAMELOR  Take 1 capsule (75 mg total) by mouth daily.     omeprazole 40 MG capsule  Commonly known as:  PRILOSEC  Take 1 capsule (40 mg total) by mouth daily.     ranitidine 150 MG tablet  Commonly known as:  ZANTAC  Take 150 mg by mouth 2 (two)  times daily.     TUMS PO  Take 3 tablets by mouth 3 (three) times daily as needed. stomach           Objective:   Physical Exam BP 110/62  Pulse 92  Temp(Src) 98.3 F (36.8 C) (Oral)  SpO2 97% General -- alert, well-developed, NAD.   Lungs -- normal respiratory effort, no intercostal retractions, no accessory muscle use, and normal breath sounds.  Heart-- normal rate, regular rhythm, no murmur.  Extremities-- no pretibial edema bilaterally ; Few very superficial scars on the anterior upper proximal thighs Neurologic--  alert & oriented X3. Speech normal, gait appropriate for age, strength symmetric and appropriate for age.  Psych-- Cognition and judgment appear intact. Cooperative with normal attention span and concentration. No anxious or depressed appearing.          Assessment & Plan:   Today , I spent more than 25   min with the patient: >50% of the time counseling regards anxiety, treatment options, new med s/e

## 2014-07-09 NOTE — Assessment & Plan Note (Addendum)
Long history of depression, relatively well controlled on nortriptyline and deplin  half tablet daily now presents with irritability/anxiety, likely related to increase a stress at work. Recommend counseling. We discussed medications such as SSRIs but she tried many of them years ago and she was intolerant to most of them. Plan: Increased Deplin to 1 tablet daily Start Clonazepam twice a day, warned about somnolence Refer to Dr. Casimiro Needle which she used to see a few years ago I'm concern about the self cutting, she strongly encouraged to let me know if that increases.

## 2014-07-09 NOTE — Patient Instructions (Signed)
Take clonazepam 1/2 or 1 tablet twice a day as needed. Watch for excessive somnolence  Call if you get worse   Consider see one of our counselors

## 2014-08-11 ENCOUNTER — Ambulatory Visit (INDEPENDENT_AMBULATORY_CARE_PROVIDER_SITE_OTHER): Payer: Private Health Insurance - Indemnity | Admitting: Medical

## 2014-08-11 ENCOUNTER — Encounter: Payer: Self-pay | Admitting: Medical

## 2014-08-11 VITALS — BP 127/83 | HR 99 | Temp 99.1°F | Ht 64.5 in | Wt 198.8 lb

## 2014-08-11 DIAGNOSIS — J302 Other seasonal allergic rhinitis: Secondary | ICD-10-CM

## 2014-08-11 DIAGNOSIS — R0981 Nasal congestion: Secondary | ICD-10-CM

## 2014-08-11 MED ORDER — DOXYCYCLINE HYCLATE 100 MG PO TABS: 100.0000 mg | ORAL_TABLET | Freq: Two times a day (BID) | ORAL | Status: DC

## 2014-08-11 MED ORDER — METHYLPREDNISOLONE ACETATE 40 MG/ML IJ SUSP
40.0000 mg | Freq: Once | INTRAMUSCULAR | Status: AC
  Administered 2014-08-11: 40 mg via INTRAMUSCULAR

## 2014-08-11 NOTE — Assessment & Plan Note (Signed)
Depo medrol im in office. Continue steroid nasal spray and claritin. If toward this weekend your symptoms worsen indicating sinus infection then start doxycyline.(Antibiotic not indicated presently but will be made available.)

## 2014-08-11 NOTE — Patient Instructions (Addendum)
Your likely have allergic rhinitis with possible sinusitis(although not completely convinced of sinus infection at this time). I would recommend stopping afrin as this may lead to rebound nasal congestion.  We gave you depo medrol 40 mg im in office today. Continue claritin. Also recommend flonase otc.   If your sinus pressure persists, I sent doxycycline  to your pharmacy.  Follow up in 7 days or as needed.

## 2014-08-11 NOTE — Progress Notes (Signed)
Pre visit review using our clinic review tool, if applicable. No additional management support is needed unless otherwise documented below in the visit note. 

## 2014-08-11 NOTE — Progress Notes (Signed)
Subjective:    Patient ID: Rebekah [name], female    DOB: 05-16-1961, 53 y.o.   MRN: [id number]  HPI  Nasal congestion for 10 days. Now maxillary sinus. Pressure behind eyes and ear pain. Hx of allergies this time of year usually. No recent sneezing or itching eyes. Pt tried claritin d. Then afrin for 2 days.  No fever, no chills and no sweats.  Past Medical History  Diagnosis Date  . GERD (gastroesophageal reflux disease)   . Hypertension   . Hyperlipidemia   . OSA (obstructive sleep apnea)     using a CPAP    . Obesity     Lap band surgery 12-08 Dr Hassell Done  . Depression   . Allergy     History   Social History  . Marital Status: Married    Spouse Name: N/A    Number of Children: 0  . Years of Education: N/A   Occupational History  . [place] History Main Topics  . Smoking status: Never Smoker   . Smokeless tobacco: Never Used  . Alcohol Use: Yes     Comment: socially   . Drug Use: No  . Sexual Activity: Not on file   Other Topics Concern  . Not on file   Social History Narrative   Lives w/ husband    Past Surgical History  Procedure Laterality Date  . Laparoscopic gastric banding      Lap band surgery 12-08 Dr Hassell Done  . Cholecystectomy    . Appendectomy    . Nasal sinus surgery    . Breast reduction surgery      Family History  Problem Relation Age of Onset  . Diabetes Neg Hx   . Colon cancer Neg Hx   . Breast cancer Neg Hx   . Coronary artery disease Father     CABG, smoker .age onset 81s  . Heart disease Mother     CABG, smoker .age onset 16s  . Diverticulitis Mother   . Prostate cancer Father     Allergies  Allergen Reactions  . Penicillins     REACTION: rash  . Erythromycin     Gastrointestinal issues.    Current Outpatient Prescriptions on File Prior to Visit  Medication Sig Dispense Refill  . aspirin 81 MG tablet Take 1 tablet (81 mg total) by mouth daily.  90 tablet  3  . atorvastatin (LIPITOR) 10 MG  tablet Take 1 tablet (10 mg total) by mouth daily.  90 tablet  3  . Calcium Carbonate Antacid (TUMS PO) Take 3 tablets by mouth 3 (three) times daily as needed. stomach       . clonazePAM (KLONOPIN) 0.5 MG tablet Take 1 tablet (0.5 mg total) by mouth 2 (two) times daily as needed for anxiety.  40 tablet  0  . L-Methylfolate (DEPLIN) 15 MG TABS Take one-half tablet by mouth one time daily  90 tablet  0  . lisinopril (PRINIVIL,ZESTRIL) 10 MG tablet Take 1 tablet (10 mg total) by mouth daily.  90 tablet  3  . Multiple Vitamin (MULTIVITAMIN) tablet Take 1 tablet by mouth daily.  90 tablet  1  . NON FORMULARY BCP per gyn      . nortriptyline (PAMELOR) 75 MG capsule Take 1 capsule (75 mg total) by mouth daily.  90 capsule  3  . omeprazole (PRILOSEC) 40 MG capsule Take 1 capsule (40 mg total) by mouth daily.  90 capsule  3  .  Probiotic Product (ALIGN) 4 MG CAPS Take 1 capsule by mouth daily as needed (gummy).      . ranitidine (ZANTAC) 150 MG tablet Take 150 mg by mouth 2 (two) times daily.       No current facility-administered medications on file prior to visit.    BP 127/83  Pulse 99  Temp(Src) 99.1 F (37.3 C) (Oral)  Ht 5' 4.5" (1.638 m)  Wt 198 lb 12.8 oz (90.175 kg)  BMI 33.61 kg/m2  SpO2 98%     Review of Systems  Constitutional: Negative for fever, chills and diaphoresis.  HENT: Positive for congestion and sinus pressure. Negative for ear pain, facial swelling, postnasal drip, sneezing, tinnitus and trouble swallowing.   Respiratory: Negative for cough, chest tightness, shortness of breath and wheezing.   Cardiovascular: Negative for chest pain and palpitations.  Gastrointestinal: Negative for nausea, vomiting, abdominal pain, diarrhea, constipation, abdominal distention, anal bleeding and rectal pain.  Genitourinary: Negative.   Musculoskeletal: Negative.   Neurological: Negative for dizziness, tremors, seizures, syncope, facial asymmetry, speech difficulty, weakness,  light-headedness, numbness and headaches.  Hematological: Negative for adenopathy. Does not bruise/bleed easily.  Psychiatric/Behavioral: Negative.        Objective:   Physical Exam  General  Mental Status - Alert. General Appearance - Well groomed. Not in acute distress.  Skin Rashes- No Rashes.  HEENT Head- Normal. Ear Auditory Canal - Left- Normal. Right - Normal.Tympanic Membrane- Left- Normal. Right- Normal. Eye Sclera/Conjunctiva- Left- Normal. Right- Normal. Nose & Sinuses Nasal Mucosa- Left-  Boggy + Congested. Right-  Boggy + Congested. Faint/minimal  maxillary sinus pressure Mouth & Throat Lips: Upper Lip- Normal: no dryness, cracking, pallor, cyanosis, or vesicular eruption. Lower Lip-Normal: no dryness, cracking, pallor, cyanosis or vesicular eruption. Buccal Mucosa- Bilateral- No Aphthous ulcers. Oropharynx- No Discharge or Erythema. Tonsils: Characteristics- Bilateral- No Erythema or Congestion. +PND. Size/Enlargement- Bilateral- No enlargement. Discharge- bilateral-None.  Neck Neck- Supple. No Masses.   Chest and Lung Exam Auscultation: Breath Sounds:-Normal. CTA.  Cardiovascular Auscultation:Rythm- Regular, Rate and rythm Murmurs & Other Heart Sounds:Ausculatation of the heart reveal- No Murmurs.  Lymphatic Head & Neck General Head & Neck Lymphatics: Bilateral: Description- No Localized lymphadenopathy.         Assessment & Plan:

## 2014-09-07 ENCOUNTER — Ambulatory Visit
Admission: RE | Admit: 2014-09-07 | Discharge: 2014-09-07 | Disposition: A | Discharge status: Home / Self Care | Payer: Private Health Insurance - Indemnity | Source: Ambulatory Visit

## 2014-09-07 ENCOUNTER — Encounter: Payer: Self-pay | Admitting: Internal Medicine

## 2014-09-07 ENCOUNTER — Ambulatory Visit (INDEPENDENT_AMBULATORY_CARE_PROVIDER_SITE_OTHER): Payer: Private Health Insurance - Indemnity | Admitting: Internal Medicine

## 2014-09-07 VITALS — BP 116/80 | HR 82 | Temp 98.6°F | Ht 65.0 in | Wt 199.5 lb

## 2014-09-07 DIAGNOSIS — Z0000 Encounter for general adult medical examination without abnormal findings: Secondary | ICD-10-CM

## 2014-09-07 DIAGNOSIS — F32A Depression, unspecified: Secondary | ICD-10-CM

## 2014-09-07 DIAGNOSIS — F329 Major depressive disorder, single episode, unspecified: Secondary | ICD-10-CM

## 2014-09-07 DIAGNOSIS — Z1231 Encounter for screening mammogram for malignant neoplasm of breast: Secondary | ICD-10-CM

## 2014-09-07 DIAGNOSIS — F419 Anxiety disorder, unspecified: Secondary | ICD-10-CM

## 2014-09-07 LAB — BASIC METABOLIC PANEL
BUN: 11 mg/dL (ref 6–23)
CO2: 23 mEq/L (ref 19–32)
Calcium: 9 mg/dL (ref 8.4–10.5)
Chloride: 114 mEq/L — ABNORMAL HIGH (ref 96–112)
Creatinine, Ser: 1 mg/dL (ref 0.4–1.2)
GFR: 62.59 mL/min (ref >60.00)
Glucose, Bld: 87 mg/dL (ref 70–99)
POTASSIUM: 4.8 meq/L (ref 3.5–5.1)
Sodium: 143 mEq/L (ref 135–145)

## 2014-09-07 LAB — CBC WITH DIFFERENTIAL/PLATELET
Basophils Absolute: 0 10*3/uL (ref 0.0–0.1)
Basophils Relative: 0.4 % (ref 0.0–3.0)
Eosinophils Absolute: 0.2 10*3/uL (ref 0.0–0.7)
Eosinophils Relative: 1.5 % (ref 0.0–5.0)
HCT: 36 % (ref 36.0–46.0)
Hemoglobin: 11.7 g/dL — ABNORMAL LOW (ref 12.0–15.0)
Lymphocytes Relative: 25.6 % (ref 12.0–46.0)
Lymphs Abs: 2.7 10*3/uL (ref 0.7–4.0)
MCHC: 32.6 g/dL (ref 30.0–36.0)
MCV: 86.6 fl (ref 78.0–100.0)
MONO ABS: 0.5 10*3/uL (ref 0.1–1.0)
MONOS PCT: 4.3 % (ref 3.0–12.0)
Neutro Abs: 7.1 10*3/uL (ref 1.4–7.7)
Neutrophils Relative %: 68.2 % (ref 43.0–77.0)
Platelets: 313 10*3/uL (ref 150.0–400.0)
RBC: 4.15 Mil/uL (ref 3.87–5.11)
RDW: 16.2 % — ABNORMAL HIGH (ref 11.5–15.5)
WBC: 10.5 10*3/uL (ref 4.0–10.5)

## 2014-09-07 LAB — HEMOGLOBIN A1C: HEMOGLOBIN A1C: 5.9 % (ref 4.6–6.5)

## 2014-09-07 MED ORDER — CLONAZEPAM 0.5 MG PO TABS: 0.5000 mg | ORAL_TABLET | Freq: Two times a day (BID) | ORAL | Status: DC | PRN

## 2014-09-07 MED ORDER — NORTRIPTYLINE HCL 75 MG PO CAPS: 75.0000 mg | ORAL_CAPSULE | Freq: Every day | ORAL | Status: DC

## 2014-09-07 MED ORDER — DEPLIN 15 MG PO TABS: ORAL_TABLET | ORAL | Status: DC

## 2014-09-07 MED ORDER — LISINOPRIL 10 MG PO TABS: 10.0000 mg | ORAL_TABLET | Freq: Every day | ORAL | Status: DC

## 2014-09-07 MED ORDER — ATORVASTATIN CALCIUM 10 MG PO TABS: 10.0000 mg | ORAL_TABLET | Freq: Every day | ORAL | Status: DC

## 2014-09-07 MED ORDER — OMEPRAZOLE 40 MG PO CPDR: 40.0000 mg | DELAYED_RELEASE_CAPSULE | Freq: Every day | ORAL | Status: DC

## 2014-09-07 NOTE — Assessment & Plan Note (Signed)
Since the last time she was here, the patient has improved, still has occasional suicidal thoughts, states she will never act upon them. Continue with anxiety, she thinks is clearly work-related. Was recommended to see her psychiatrist but has not so far ("they are not going to do anything different").   The patient is counseled today, I again recommend to see psychiatry, she is somehow reluctant; I will refill her medications for now  and advised her to call anytime if she gets worse

## 2014-09-07 NOTE — Patient Instructions (Signed)
Get your blood work before you leave    Please come back to the office in 4 months  for a routine check up   

## 2014-09-07 NOTE — Assessment & Plan Note (Addendum)
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

## 2014-09-07 NOTE — Progress Notes (Signed)
Pre visit review using our clinic review tool, if applicable. No additional management support is needed unless otherwise documented below in the visit note. 

## 2014-09-07 NOTE — Progress Notes (Signed)
Subjective:    Patient ID: Rebekah [name], female    DOB: 09/21/1961, 53 y.o.   MRN: [id number]  DOS:  09/07/2014 Type of visit - description : cpx Interval history: Shown to her physical exam we discussed other issues. See assessment and plan.  ROS Denies chest pain, difficulty breathing or lower extremity edema No nausea, vomiting, diarrhea or blood in the stools. Occasional GERD symptoms particularly if she eats late  Past Medical History  Diagnosis Date  . GERD (gastroesophageal reflux disease)   . Hypertension   . Hyperlipidemia   . OSA (obstructive sleep apnea)     using a CPAP    . Obesity     Lap band surgery 12-08 Dr Hassell Done  . Depression   . Allergy     Past Surgical History  Procedure Laterality Date  . Laparoscopic gastric banding      Lap band surgery 12-08 Dr Hassell Done  . Cholecystectomy    . Appendectomy    . Nasal sinus surgery    . Breast reduction surgery      History   Social History  . Marital Status: Married    Spouse Name: N/A    Number of Children: 0  . Years of Education: N/A   Occupational History  . [place] History Main Topics  . Smoking status: Never Smoker   . Smokeless tobacco: Never Used  . Alcohol Use: Yes     Comment: socially   . Drug Use: No  . Sexual Activity: Not on file   Other Topics Concern  . Not on file   Social History Narrative   Lives w/ husband     Family History  Problem Relation Age of Onset  . Diabetes Neg Hx   . Colon cancer Neg Hx   . Breast cancer Neg Hx   . Coronary artery disease Father     CABG, smoker .age onset 10s  . Heart disease Mother     CABG, smoker .age onset 30s  . Diverticulitis Mother   . Prostate cancer Father        Medication List       This list is accurate as of: 09/07/14  4:58 PM.  Always use your most recent med list.               aspirin 81 MG tablet  Take 1 tablet (81 mg total) by mouth daily.     atorvastatin 10 MG tablet    Commonly known as:  LIPITOR  Take 1 tablet (10 mg total) by mouth daily.     clonazePAM 0.5 MG tablet  Commonly known as:  KLONOPIN  Take 1 tablet (0.5 mg total) by mouth 2 (two) times daily as needed for anxiety.     DEPLIN 15 MG Tabs  Take one-half tablet by mouth one time daily     lisinopril 10 MG tablet  Commonly known as:  PRINIVIL,ZESTRIL  Take 1 tablet (10 mg total) by mouth daily.     multivitamin tablet  Take 1 tablet by mouth daily.     NON FORMULARY  BCP per gyn     nortriptyline 75 MG capsule  Commonly known as:  PAMELOR  Take 1 capsule (75 mg total) by mouth daily.     omeprazole 40 MG capsule  Commonly known as:  PRILOSEC  Take 1 capsule (40 mg total) by mouth daily.     ranitidine 150 MG tablet  Commonly known  as:  ZANTAC  Take 150 mg by mouth 2 (two) times daily.     TUMS PO  Take 3 tablets by mouth 3 (three) times daily as needed. stomach           Objective:   Physical Exam BP 116/80 mmHg  Pulse 82  Temp(Src) 98.6 F (37 C) (Oral)  Ht 5\' 5"  (1.651 m)  Wt 199 lb 8 oz (90.493 kg)  BMI 33.20 kg/m2  SpO2 99%  General -- alert, well-developed, NAD.  Neck --no thyromegaly  HEENT-- Not pale.   Lungs -- normal respiratory effort, no intercostal retractions, no accessory muscle use, and normal breath sounds.  Heart-- normal rate, regular rhythm, no murmur.  Abdomen-- Not distended, good bowel sounds,soft, non-tender. No rebound or rigidity.  Extremities-- no pretibial edema bilaterally  Neurologic--  alert & oriented X3. Speech normal, gait appropriate for age, strength symmetric and appropriate for age.  Psych-- Cognition and judgment appear intact. Cooperative with normal attention span and concentration. No anxious or depressed appearing.     Assessment & Plan:   Hypertension, good medication compliance, ambulatory BPs within normal  High cholesterol, good compliance with Lipitor, last FLP very good.

## 2014-09-10 ENCOUNTER — Encounter (HOSPITAL_COMMUNITY): Payer: Self-pay | Admitting: Emergency Medicine

## 2014-09-10 ENCOUNTER — Emergency Department (HOSPITAL_COMMUNITY): Payer: Private Health Insurance - Indemnity

## 2014-09-10 ENCOUNTER — Emergency Department (HOSPITAL_COMMUNITY)
Admission: EM | Admit: 2014-09-10 | Discharge: 2014-09-10 | Disposition: A | Discharge status: Home / Self Care | Payer: Private Health Insurance - Indemnity | Attending: Emergency Medicine | Admitting: Emergency Medicine

## 2014-09-10 DIAGNOSIS — T2027XA Burn of second degree of neck, initial encounter: Secondary | ICD-10-CM

## 2014-09-10 DIAGNOSIS — W548XXA Other contact with dog, initial encounter: Secondary | ICD-10-CM | POA: Insufficient documentation

## 2014-09-10 DIAGNOSIS — S6992XA Unspecified injury of left wrist, hand and finger(s), initial encounter: Secondary | ICD-10-CM | POA: Diagnosis present

## 2014-09-10 DIAGNOSIS — Z9981 Dependence on supplemental oxygen: Secondary | ICD-10-CM | POA: Diagnosis not present

## 2014-09-10 DIAGNOSIS — Z880 Allergy status to penicillin: Secondary | ICD-10-CM | POA: Insufficient documentation

## 2014-09-10 DIAGNOSIS — K219 Gastro-esophageal reflux disease without esophagitis: Secondary | ICD-10-CM | POA: Diagnosis not present

## 2014-09-10 DIAGNOSIS — E669 Obesity, unspecified: Secondary | ICD-10-CM | POA: Insufficient documentation

## 2014-09-10 DIAGNOSIS — S62617A Displaced fracture of proximal phalanx of left little finger, initial encounter for closed fracture: Secondary | ICD-10-CM | POA: Insufficient documentation

## 2014-09-10 DIAGNOSIS — E785 Hyperlipidemia, unspecified: Secondary | ICD-10-CM | POA: Insufficient documentation

## 2014-09-10 DIAGNOSIS — F329 Major depressive disorder, single episode, unspecified: Secondary | ICD-10-CM | POA: Diagnosis not present

## 2014-09-10 DIAGNOSIS — G4733 Obstructive sleep apnea (adult) (pediatric): Secondary | ICD-10-CM | POA: Insufficient documentation

## 2014-09-10 DIAGNOSIS — Y998 Other external cause status: Secondary | ICD-10-CM | POA: Diagnosis not present

## 2014-09-10 DIAGNOSIS — Y9289 Other specified places as the place of occurrence of the external cause: Secondary | ICD-10-CM | POA: Diagnosis not present

## 2014-09-10 DIAGNOSIS — X58XXXA Exposure to other specified factors, initial encounter: Secondary | ICD-10-CM | POA: Diagnosis not present

## 2014-09-10 DIAGNOSIS — I10 Essential (primary) hypertension: Secondary | ICD-10-CM | POA: Diagnosis not present

## 2014-09-10 DIAGNOSIS — R52 Pain, unspecified: Secondary | ICD-10-CM

## 2014-09-10 DIAGNOSIS — Z7982 Long term (current) use of aspirin: Secondary | ICD-10-CM | POA: Insufficient documentation

## 2014-09-10 DIAGNOSIS — S62607A Fracture of unspecified phalanx of left little finger, initial encounter for closed fracture: Secondary | ICD-10-CM

## 2014-09-10 DIAGNOSIS — Y9301 Activity, walking, marching and hiking: Secondary | ICD-10-CM | POA: Diagnosis not present

## 2014-09-10 MED ORDER — BACITRACIN ZINC 500 UNIT/GM EX OINT: 1.0000 "application " | TOPICAL_OINTMENT | Freq: Two times a day (BID) | CUTANEOUS | Status: DC

## 2014-09-10 NOTE — Discharge Instructions (Signed)
Finger Fracture Fractures of fingers are breaks in the bones of the fingers. There are many types of fractures. There are different ways of treating these fractures. Your health care provider will discuss the best way to treat your fracture. CAUSES Traumatic injury is the main cause of broken fingers. These include:  Injuries while playing sports.  Workplace injuries.  Falls. RISK FACTORS Activities that can increase your risk of finger fractures include:  Sports.  Workplace activities that involve machinery.  A condition called osteoporosis, which can make your bones less dense and cause them to fracture more easily. SIGNS AND SYMPTOMS The main symptoms of a broken finger are pain and swelling within 15 minutes after the injury. Other symptoms include:  Bruising of your finger.  Stiffness of your finger.  Numbness of your finger.  Exposed bones (compound fracture) if the fracture is severe. DIAGNOSIS  The best way to diagnose a broken bone is with X-ray imaging. Additionally, your health care provider will use this X-ray image to evaluate the position of the broken finger bones.  TREATMENT  Finger fractures can be treated with:   Nonreduction--This means the bones are in place. The finger is splinted without changing the positions of the bone pieces. The splint is usually left on for about a week to 10 days. This will depend on your fracture and what your health care provider thinks.  Closed reduction--The bones are put back into position without using surgery. The finger is then splinted.  Open reduction and internal fixation--The fracture site is opened. Then the bone pieces are fixed into place with pins or some type of hardware. This is seldom required. It depends on the severity of the fracture. HOME CARE INSTRUCTIONS   Follow your health care provider's instructions regarding activities, exercises, and physical therapy.  Only take over-the-counter or prescription  medicines for pain, discomfort, or fever as directed by your health care provider. SEEK MEDICAL CARE IF: You have pain or swelling that limits the motion or use of your fingers. SEEK IMMEDIATE MEDICAL CARE IF:  Your finger becomes numb. MAKE SURE YOU:   Understand these instructions.  Will watch your condition.  Will get help right away if you are not doing well or get worse. Document Released: 01/20/2001 Document Revised: 07/29/2013 Document Reviewed: 05/20/2013 [facility] Patient Information 2015 [place], [place]. This information is not intended to replace advice given to you by your health care provider. Make sure you discuss any questions you have with your health care provider.  Burn Care Your skin is a natural barrier to infection. It is the largest organ of your body. Burns damage this natural protection. To help prevent infection, it is very important to follow your caregiver's instructions in the care of your burn. Burns are classified as:  First degree. There is only redness of the skin (erythema). No scarring is expected.  Second degree. There is blistering of the skin. Scarring may occur with deeper burns.  Third degree. All layers of the skin are injured, and scarring is expected. HOME CARE INSTRUCTIONS   Wash your hands well before changing your bandage.  Change your bandage as often as directed by your caregiver.  Remove the old bandage. If the bandage sticks, you may soak it off with cool, clean water.  Cleanse the burn thoroughly but gently with mild soap and water.  Pat the area dry with a clean, dry cloth.  Apply a thin layer of antibacterial cream to the burn.  Apply a clean bandage  as instructed by your caregiver.  Keep the bandage as clean and dry as possible.  Elevate the affected area for the first 24 hours, then as instructed by your caregiver.  Only take over-the-counter or prescription medicines for pain, discomfort, or fever as directed by your  caregiver. SEEK IMMEDIATE MEDICAL CARE IF:   You develop excessive pain.  You develop redness, tenderness, swelling, or red streaks near the burn.  The burned area develops yellowish-white fluid (pus) or a bad smell.  You have a fever. MAKE SURE YOU:   Understand these instructions.  Will watch your condition.  Will get help right away if you are not doing well or get worse. Document Released: 10/08/2005 Document Revised: 12/31/2011 Document Reviewed: 02/28/2011 [facility] Patient Information 2015 [place], [place]. This information is not intended to replace advice given to you by your health care provider. Make sure you discuss any questions you have with your health care provider.

## 2014-09-10 NOTE — ED Notes (Signed)
Pt transported to radiology.

## 2014-09-10 NOTE — ED Provider Notes (Signed)
CSN: [id number]     Arrival date & time 09/10/14  0226 History   First MD Initiated Contact with Patient 09/10/14 [phone]     Chief Complaint  Patient presents with  . Hand Pain    (Consider location/radiation/quality/duration/timing/severity/associated sxs/prior Treatment) HPI Comments: 53 year old female presents to the emergency department for further evaluation of pain to her left fourth and fifth digits. Patient states that she was walking her dog when he took off running causing her to fall forward on her left side. Patient states that she has pain at the base of her left fifth finger as well as from the base of her left fourth digit to the middle of the digit. She denies any radiation of the pain and states that his aching. Pain is worse with movement. No medications taken prior to arrival. Patient denies any loss of sensation or weakness. She states that the leash caught the side of her neck and she is feeling some discomfort in this area. No head trauma, loss of consciousness, shortness of breath, or difficulty swallowing. Tetanus UTD.  Patient is a 53 y.o. female presenting with hand pain. The history is provided by the patient. No language interpreter was used.  Hand Pain This is a new problem. The current episode started today. The problem occurs constantly. The problem has been unchanged. Associated symptoms include arthralgias, joint swelling and myalgias. Pertinent negatives include no chest pain, numbness or weakness. The symptoms are aggravated by bending. She has tried nothing for the symptoms.    Past Medical History  Diagnosis Date  . GERD (gastroesophageal reflux disease)   . Hypertension   . Hyperlipidemia   . OSA (obstructive sleep apnea)     using a CPAP    . Obesity     Lap band surgery 12-08 Dr Hassell Done  . Depression   . Allergy    Past Surgical History  Procedure Laterality Date  . Laparoscopic gastric banding      Lap band surgery 12-08 Dr Hassell Done  .  Cholecystectomy    . Appendectomy    . Nasal sinus surgery    . Breast reduction surgery     Family History  Problem Relation Age of Onset  . Diabetes Neg Hx   . Colon cancer Neg Hx   . Breast cancer Neg Hx   . Coronary artery disease Father     CABG, smoker .age onset 75s  . Heart disease Mother     CABG, smoker .age onset 98s  . Diverticulitis Mother   . Prostate cancer Father    History  Substance Use Topics  . Smoking status: Never Smoker   . Smokeless tobacco: Never Used  . Alcohol Use: Yes     Comment: socially    OB History    No data available      Review of Systems  Cardiovascular: Negative for chest pain.  Musculoskeletal: Positive for myalgias, joint swelling and arthralgias.  Skin: Positive for wound.  Neurological: Negative for weakness and numbness.  All other systems reviewed and are negative.   Allergies  Penicillins and Erythromycin  Home Medications   Prior to Admission medications   Medication Sig Start Date End Date Taking? Authorizing Provider  aspirin 81 MG tablet Take 1 tablet (81 mg total) by mouth daily. 09/04/13  Yes Colon Branch, MD  atorvastatin (LIPITOR) 10 MG tablet Take 1 tablet (10 mg total) by mouth daily. 09/07/14  Yes Colon Branch, MD  Calcium Carbonate Antacid (TUMS PO)  Take 3 tablets by mouth 3 (three) times daily as needed. stomach    Yes Historical Provider, MD  clonazePAM (KLONOPIN) 0.5 MG tablet Take 1 tablet (0.5 mg total) by mouth 2 (two) times daily as needed for anxiety. 09/07/14  Yes Colon Branch, MD  L-Methylfolate (DEPLIN) 15 MG TABS Take one-half tablet by mouth one time daily Patient taking differently: Take 7.5 mg by mouth daily.  09/07/14  Yes Colon Branch, MD  lisinopril (PRINIVIL,ZESTRIL) 10 MG tablet Take 1 tablet (10 mg total) by mouth daily. 09/07/14  Yes Colon Branch, MD  Multiple Vitamin (MULTIVITAMIN) tablet Take 1 tablet by mouth daily. 09/04/13  Yes Colon Branch, MD  nortriptyline (PAMELOR) 75 MG capsule Take 1  capsule (75 mg total) by mouth daily. 09/07/14  Yes Colon Branch, MD  omeprazole (PRILOSEC) 40 MG capsule Take 1 capsule (40 mg total) by mouth daily. 09/07/14  Yes Colon Branch, MD  ranitidine (ZANTAC) 150 MG tablet Take 150 mg by mouth 2 (two) times daily.   Yes Historical Provider, MD  bacitracin ointment Apply 1 application topically 2 (two) times daily. 09/10/14   Antonietta Breach, PA-C  NON FORMULARY BCP per gyn    Historical Provider, MD   BP 116/77 mmHg  Pulse 77  Temp(Src) 98.9 F (37.2 C) (Oral)  Resp 18  Ht 5\' 4"  (1.626 m)  Wt 199 lb (90.266 kg)  BMI 34.14 kg/m2  SpO2 100%   Physical Exam  Constitutional: She is oriented to person, place, and time. She appears well-developed and well-nourished. No distress.  HENT:  Head: Normocephalic and atraumatic.  Eyes: Conjunctivae and EOM are normal. No scleral icterus.  Neck: Normal range of motion.    Cardiovascular: Normal rate, regular rhythm and intact distal pulses.   Distal radial pulse 2+ and left upper extremity. Capillary refill brisk in all digits of left hand  Pulmonary/Chest: Effort normal. No respiratory distress.  Musculoskeletal: She exhibits tenderness.       Left hand: She exhibits decreased range of motion (limited AROM of L 5th digit), tenderness, bony tenderness (L 5th MCP joint) and swelling (mild soft tissue swelling of L 5th digit and proximal L 4th digit). She exhibits normal two-point discrimination, normal capillary refill, no deformity and no laceration. Normal sensation noted. Normal strength noted.       Hands: 5/5 strength against resistance of extensors of L 4th and 5th digits; 4/5 strength in FDP and FDS of th digit, likely secondary to pain.  Neurological: She is alert and oriented to person, place, and time. She exhibits normal muscle tone. Coordination normal.  Sensation to light touch intact. Patient able to wiggle all fingers.  Skin: Skin is warm and dry. Burn (neck) noted. No rash noted. She is not  diaphoretic. No erythema. No pallor.  Psychiatric: She has a normal mood and affect. Her behavior is normal.  Nursing note and vitals reviewed.   ED Course  Procedures (including critical care time) Labs Review Labs Reviewed - No data to display  Imaging Review Dg Hand Complete Left  09/10/2014   CLINICAL DATA:  Left hand pain after a injury.  EXAM: LEFT HAND - COMPLETE 3+ VIEW  COMPARISON:  None.  FINDINGS: There is an acute transverse fracture of the proximal aspect proximal phalanx left fifth finger with mild dorsal angulation of the distal fracture fragments. Radiopaque densities are demonstrated at the distal aspects of the second through fifth fingers which appears to be surface contamination.  Superficial soft tissue calcifications not entirely excluded. Mild degenerative changes in the STT and first carpometacarpal joints. No other focal bony abnormalities identified.  IMPRESSION: Acute transverse fracture of the base of the proximal phalanx of the left fifth finger.   Electronically Signed   By: Lucienne Capers M.D.   On: 09/10/2014 03:07     EKG Interpretation None      MDM   Final diagnoses:  Closed fracture of phalanx of fifth finger of left hand, initial encounter  Second degree burn of neck, initial encounter    53 year old female presents to the emergency department for further evaluation of left hand pain after she was pulled by her dog causing her to fall forward. Patient found to have a fracture at the base of her proximal phalanx of her left fifth finger. Patient is neurovascularly intact. No sensory deficits appreciated. Patient placed in finger splint for stability and proper healing. She declines pain medication in ED.   Patient also had the leash come in contact with the right side of her neck causing a superficial partial-thickness burn without blister formation. No associated shortness of breath, difficulty swallowing, syncope, or head trauma. Wound care  provided in ED and patient will be discharged with prescription for bacitracin. Tetanus up-to-date. Patient given referral to Dr. Leanora Cover to ensure proper healing of hand injury. Return precautions discussed and provided. Patient agreeable to plan with no unaddressed concerns. Patient discharged in good condition; VSS.   Filed Vitals:   09/10/14 0235 09/10/14 0330 09/10/14 0401 09/10/14 0430  BP: 112/74 110/70 112/69 116/77  Pulse: 83 77 79 77  Temp: 98 F (36.7 C)  98.9 F (37.2 C)   TempSrc: Oral  Oral   Resp: 16 14 18    Height: 5\' 4"  (1.626 m)     Weight: 199 lb (90.266 kg)     SpO2: 100% 100% 100% 100%     Antonietta Breach, PA-C 09/10/14 0510

## 2014-09-10 NOTE — ED Notes (Signed)
Family at bedside. 

## 2014-09-10 NOTE — ED Notes (Signed)
Pt reports she fell approx 45 mins ago injuring L hand. Pain and swelling noted to L lateral pinky.

## 2014-09-13 ENCOUNTER — Encounter (HOSPITAL_BASED_OUTPATIENT_CLINIC_OR_DEPARTMENT_OTHER): Payer: Self-pay | Admitting: *Deleted

## 2014-09-13 ENCOUNTER — Other Ambulatory Visit: Payer: Self-pay | Admitting: Orthopedic Surgery

## 2014-09-13 NOTE — Progress Notes (Signed)
Will bring cpap and use post op Needs ekg-last 2013 Labs done 09/07/14

## 2014-09-14 ENCOUNTER — Encounter (HOSPITAL_BASED_OUTPATIENT_CLINIC_OR_DEPARTMENT_OTHER)
Admission: RE | Disposition: A | Discharge status: Home / Self Care | Payer: Self-pay | Source: Ambulatory Visit | Attending: Orthopedic Surgery

## 2014-09-14 ENCOUNTER — Ambulatory Visit (HOSPITAL_BASED_OUTPATIENT_CLINIC_OR_DEPARTMENT_OTHER)
Admission: RE | Admit: 2014-09-14 | Discharge: 2014-09-14 | Disposition: A | Discharge status: Home / Self Care | Payer: Private Health Insurance - Indemnity | Source: Ambulatory Visit | Attending: Orthopedic Surgery | Admitting: Orthopedic Surgery

## 2014-09-14 ENCOUNTER — Ambulatory Visit (HOSPITAL_BASED_OUTPATIENT_CLINIC_OR_DEPARTMENT_OTHER): Payer: Private Health Insurance - Indemnity | Admitting: Anesthesiology

## 2014-09-14 ENCOUNTER — Encounter (HOSPITAL_BASED_OUTPATIENT_CLINIC_OR_DEPARTMENT_OTHER): Payer: Self-pay

## 2014-09-14 DIAGNOSIS — Y929 Unspecified place or not applicable: Secondary | ICD-10-CM | POA: Diagnosis not present

## 2014-09-14 DIAGNOSIS — W541XXA Struck by dog, initial encounter: Secondary | ICD-10-CM | POA: Diagnosis not present

## 2014-09-14 DIAGNOSIS — Y939 Activity, unspecified: Secondary | ICD-10-CM | POA: Diagnosis not present

## 2014-09-14 DIAGNOSIS — S62617A Displaced fracture of proximal phalanx of left little finger, initial encounter for closed fracture: Secondary | ICD-10-CM | POA: Insufficient documentation

## 2014-09-14 DIAGNOSIS — Z9884 Bariatric surgery status: Secondary | ICD-10-CM | POA: Insufficient documentation

## 2014-09-14 DIAGNOSIS — E669 Obesity, unspecified: Secondary | ICD-10-CM | POA: Insufficient documentation

## 2014-09-14 DIAGNOSIS — I10 Essential (primary) hypertension: Secondary | ICD-10-CM | POA: Diagnosis not present

## 2014-09-14 DIAGNOSIS — Z79899 Other long term (current) drug therapy: Secondary | ICD-10-CM | POA: Diagnosis not present

## 2014-09-14 DIAGNOSIS — F329 Major depressive disorder, single episode, unspecified: Secondary | ICD-10-CM | POA: Diagnosis not present

## 2014-09-14 DIAGNOSIS — E785 Hyperlipidemia, unspecified: Secondary | ICD-10-CM | POA: Insufficient documentation

## 2014-09-14 DIAGNOSIS — K219 Gastro-esophageal reflux disease without esophagitis: Secondary | ICD-10-CM | POA: Insufficient documentation

## 2014-09-14 DIAGNOSIS — Y999 Unspecified external cause status: Secondary | ICD-10-CM | POA: Insufficient documentation

## 2014-09-14 DIAGNOSIS — Z6833 Body mass index (BMI) 33.0-33.9, adult: Secondary | ICD-10-CM | POA: Insufficient documentation

## 2014-09-14 DIAGNOSIS — G4733 Obstructive sleep apnea (adult) (pediatric): Secondary | ICD-10-CM | POA: Insufficient documentation

## 2014-09-14 HISTORY — PX: CLOSED REDUCTION FINGER WITH PERCUTANEOUS PINNING: SHX5612

## 2014-09-14 SURGERY — CLOSED REDUCTION, FINGER, WITH PERCUTANEOUS PINNING
Anesthesia: General | Site: Finger | Laterality: Left

## 2014-09-14 MED ORDER — HYDROCODONE-ACETAMINOPHEN 5-325 MG PO TABS: ORAL_TABLET | ORAL | Status: DC

## 2014-09-14 MED ORDER — MIDAZOLAM HCL 2 MG/2ML IJ SOLN
INTRAMUSCULAR | Status: AC
  Filled 2014-09-14: qty 2

## 2014-09-14 MED ORDER — DIPHENHYDRAMINE HCL 50 MG/ML IJ SOLN
INTRAMUSCULAR | Status: DC | PRN
  Administered 2014-09-14: 6.25 mg via INTRAVENOUS

## 2014-09-14 MED ORDER — ONDANSETRON HCL 4 MG/2ML IJ SOLN
INTRAMUSCULAR | Status: DC | PRN
  Administered 2014-09-14: 4 mg via INTRAVENOUS

## 2014-09-14 MED ORDER — FENTANYL CITRATE 0.05 MG/ML IJ SOLN
50.0000 ug | INTRAMUSCULAR | Status: DC | PRN
  Administered 2014-09-14: 100 ug via INTRAVENOUS

## 2014-09-14 MED ORDER — FENTANYL CITRATE 0.05 MG/ML IJ SOLN
INTRAMUSCULAR | Status: DC | PRN
  Administered 2014-09-14: 100 ug via INTRAVENOUS

## 2014-09-14 MED ORDER — OXYCODONE HCL 5 MG PO TABS: 5.0000 mg | ORAL_TABLET | Freq: Once | ORAL | Status: DC | PRN

## 2014-09-14 MED ORDER — MIDAZOLAM HCL 2 MG/2ML IJ SOLN
1.0000 mg | INTRAMUSCULAR | Status: DC | PRN
  Administered 2014-09-14: 2 mg via INTRAVENOUS

## 2014-09-14 MED ORDER — PROPOFOL 10 MG/ML IV EMUL
INTRAVENOUS | Status: AC
  Filled 2014-09-14: qty 50

## 2014-09-14 MED ORDER — BUPIVACAINE-EPINEPHRINE (PF) 0.5% -1:200000 IJ SOLN
INTRAMUSCULAR | Status: DC | PRN
  Administered 2014-09-14: 25 mL via PERINEURAL

## 2014-09-14 MED ORDER — OXYCODONE HCL 5 MG/5ML PO SOLN
5.0000 mg | Freq: Once | ORAL | Status: DC | PRN
Start: 2014-09-14 — End: 2014-09-14

## 2014-09-14 MED ORDER — BUPIVACAINE HCL (PF) 0.25 % IJ SOLN
INTRAMUSCULAR | Status: AC
  Filled 2014-09-14: qty 30

## 2014-09-14 MED ORDER — VANCOMYCIN HCL IN DEXTROSE 1-5 GM/200ML-% IV SOLN
1000.0000 mg | INTRAVENOUS | Status: AC
  Administered 2014-09-14: 1000 mg via INTRAVENOUS

## 2014-09-14 MED ORDER — CHLORHEXIDINE GLUCONATE 4 % EX LIQD: 60.0000 mL | Freq: Once | CUTANEOUS | Status: DC

## 2014-09-14 MED ORDER — VANCOMYCIN HCL IN DEXTROSE 1-5 GM/200ML-% IV SOLN
INTRAVENOUS | Status: AC
  Filled 2014-09-14: qty 200

## 2014-09-14 MED ORDER — LACTATED RINGERS IV SOLN
INTRAVENOUS | Status: DC
  Administered 2014-09-14: 11:00:00 via INTRAVENOUS

## 2014-09-14 MED ORDER — ONDANSETRON HCL 4 MG/2ML IJ SOLN: 4.0000 mg | Freq: Once | INTRAMUSCULAR | Status: DC | PRN

## 2014-09-14 MED ORDER — MIDAZOLAM HCL 5 MG/5ML IJ SOLN
INTRAMUSCULAR | Status: DC | PRN
  Administered 2014-09-14: 2 mg via INTRAVENOUS

## 2014-09-14 MED ORDER — FENTANYL CITRATE 0.05 MG/ML IJ SOLN
INTRAMUSCULAR | Status: AC
  Filled 2014-09-14: qty 2

## 2014-09-14 MED ORDER — FENTANYL CITRATE 0.05 MG/ML IJ SOLN
INTRAMUSCULAR | Status: AC
  Filled 2014-09-14: qty 4

## 2014-09-14 MED ORDER — HYDROMORPHONE HCL 1 MG/ML IJ SOLN: 0.2500 mg | INTRAMUSCULAR | Status: DC | PRN

## 2014-09-14 MED ORDER — FENTANYL CITRATE 0.05 MG/ML IJ SOLN
INTRAMUSCULAR | Status: AC
  Filled 2014-09-14: qty 6

## 2014-09-14 MED ORDER — LIDOCAINE HCL (CARDIAC) 20 MG/ML IV SOLN
INTRAVENOUS | Status: DC | PRN
  Administered 2014-09-14: 40 mg via INTRAVENOUS

## 2014-09-14 MED ORDER — PROPOFOL INFUSION 10 MG/ML OPTIME
INTRAVENOUS | Status: DC | PRN
  Administered 2014-09-14: 140 ug/kg/min via INTRAVENOUS

## 2014-09-14 SURGICAL SUPPLY — 59 items
BANDAGE ELASTIC 3 VELCRO ST LF (GAUZE/BANDAGES/DRESSINGS) IMPLANT
BANDAGE ELASTIC 4 VELCRO ST LF (GAUZE/BANDAGES/DRESSINGS) ×1 IMPLANT
BLADE MINI RND TIP GREEN BEAV (BLADE) IMPLANT
BLADE SURG 15 STRL LF DISP TIS (BLADE) ×2 IMPLANT
BLADE SURG 15 STRL SS (BLADE) ×4
BNDG CMPR 9X4 STRL LF SNTH (GAUZE/BANDAGES/DRESSINGS) ×1
BNDG CMPR MD 5X2 ELC HKLP STRL (GAUZE/BANDAGES/DRESSINGS)
BNDG ELASTIC 2 VLCR STRL LF (GAUZE/BANDAGES/DRESSINGS) IMPLANT
BNDG ESMARK 4X9 LF (GAUZE/BANDAGES/DRESSINGS) ×1 IMPLANT
BNDG GAUZE ELAST 4 BULKY (GAUZE/BANDAGES/DRESSINGS) ×2 IMPLANT
CHLORAPREP W/TINT 26ML (MISCELLANEOUS) ×2 IMPLANT
CORDS BIPOLAR (ELECTRODE) ×1 IMPLANT
COVER BACK TABLE 60X90IN (DRAPES) ×2 IMPLANT
COVER MAYO STAND STRL (DRAPES) ×2 IMPLANT
CUFF TOURNIQUET SINGLE 18IN (TOURNIQUET CUFF) ×2 IMPLANT
DRAPE EXTREMITY T 121X128X90 (DRAPE) ×2 IMPLANT
DRAPE OEC MINIVIEW 54X84 (DRAPES) ×1 IMPLANT
DRAPE SURG 17X23 STRL (DRAPES) ×2 IMPLANT
GAUZE SPONGE 4X4 12PLY STRL (GAUZE/BANDAGES/DRESSINGS) ×2 IMPLANT
GAUZE XEROFORM 1X8 LF (GAUZE/BANDAGES/DRESSINGS) ×2 IMPLANT
GLOVE BIO SURGEON STRL SZ7.5 (GLOVE) ×2 IMPLANT
GLOVE BIOGEL PI IND STRL 8 (GLOVE) ×1 IMPLANT
GLOVE BIOGEL PI IND STRL 8.5 (GLOVE) IMPLANT
GLOVE BIOGEL PI INDICATOR 8 (GLOVE) ×1
GLOVE BIOGEL PI INDICATOR 8.5 (GLOVE)
GLOVE SURG ORTHO 8.0 STRL STRW (GLOVE) IMPLANT
GOWN STRL REUS W/ TWL LRG LVL3 (GOWN DISPOSABLE) ×1 IMPLANT
GOWN STRL REUS W/ TWL XL LVL3 (GOWN DISPOSABLE) ×1 IMPLANT
GOWN STRL REUS W/TWL LRG LVL3 (GOWN DISPOSABLE) ×2
GOWN STRL REUS W/TWL XL LVL3 (GOWN DISPOSABLE) ×3 IMPLANT
K-WIRE .035X4 (WIRE) ×2 IMPLANT
NDL HYPO 25X1 1.5 SAFETY (NEEDLE) IMPLANT
NEEDLE HYPO 22GX1.5 SAFETY (NEEDLE) IMPLANT
NEEDLE HYPO 25X1 1.5 SAFETY (NEEDLE) ×2 IMPLANT
NS IRRIG 1000ML POUR BTL (IV SOLUTION) ×2 IMPLANT
PACK BASIN DAY SURGERY FS (CUSTOM PROCEDURE TRAY) ×2 IMPLANT
PAD CAST 3X4 CTTN HI CHSV (CAST SUPPLIES) IMPLANT
PAD CAST 4YDX4 CTTN HI CHSV (CAST SUPPLIES) IMPLANT
PADDING CAST ABS 4INX4YD NS (CAST SUPPLIES) ×1
PADDING CAST ABS COTTON 4X4 ST (CAST SUPPLIES) ×1 IMPLANT
PADDING CAST COTTON 3X4 STRL (CAST SUPPLIES)
PADDING CAST COTTON 4X4 STRL (CAST SUPPLIES) ×4
SLEEVE SCD COMPRESS KNEE MED (MISCELLANEOUS) IMPLANT
SPLINT PLASTER CAST XFAST 3X15 (CAST SUPPLIES) IMPLANT
SPLINT PLASTER CAST XFAST 4X15 (CAST SUPPLIES) IMPLANT
SPLINT PLASTER XTRA FAST SET 4 (CAST SUPPLIES)
SPLINT PLASTER XTRA FASTSET 3X (CAST SUPPLIES) ×1
SPONGE GAUZE 4X4 12PLY STER LF (GAUZE/BANDAGES/DRESSINGS) ×1 IMPLANT
STOCKINETTE 4X48 STRL (DRAPES) ×2 IMPLANT
SUT ETHILON 3 0 PS 1 (SUTURE) IMPLANT
SUT ETHILON 4 0 PS 2 18 (SUTURE) ×2 IMPLANT
SUT MERSILENE 4 0 P 3 (SUTURE) IMPLANT
SUT VIC AB 3-0 PS1 18 (SUTURE)
SUT VIC AB 3-0 PS1 18XBRD (SUTURE) IMPLANT
SUT VICRYL 4-0 PS2 18IN ABS (SUTURE) IMPLANT
SYR BULB 3OZ (MISCELLANEOUS) ×2 IMPLANT
SYR CONTROL 10ML LL (SYRINGE) IMPLANT
TOWEL OR 17X24 6PK STRL BLUE (TOWEL DISPOSABLE) ×4 IMPLANT
UNDERPAD 30X30 INCONTINENT (UNDERPADS AND DIAPERS) ×2 IMPLANT

## 2014-09-14 NOTE — Op Note (Signed)
Intra-operative fluoroscopic images in the AP, lateral, and oblique views were taken and evaluated by myself.  Reduction and hardware placement were confirmed.  There was no intraarticular penetration of permanent hardware.  

## 2014-09-14 NOTE — Anesthesia Preprocedure Evaluation (Signed)
Anesthesia Evaluation  Patient identified by MRN, date of birth, ID band Patient awake    Reviewed: Allergy & Precautions, H&P , NPO status , Patient's Chart, lab work & pertinent test results  Airway Mallampati: II  TM Distance: >3 FB Neck ROM: Full    Dental  (+) Teeth Intact, Dental Advisory Given   Pulmonary  breath sounds clear to auscultation        Cardiovascular hypertension, Pt. on medications Rhythm:Regular Rate:Normal     Neuro/Psych    GI/Hepatic GERD-  Medicated and Controlled,  Endo/Other    Renal/GU      Musculoskeletal   Abdominal   Peds  Hematology   Anesthesia Other Findings   Reproductive/Obstetrics                             Anesthesia Physical Anesthesia Plan  ASA: II  Anesthesia Plan: General   Post-op Pain Management:    Induction: Intravenous  Airway Management Planned: LMA  Additional Equipment:   Intra-op Plan:   Post-operative Plan: Extubation in OR  Informed Consent: I have reviewed the patients History and Physical, chart, labs and discussed the procedure including the risks, benefits and alternatives for the proposed anesthesia with the patient or authorized representative who has indicated his/her understanding and acceptance.   Dental advisory given  Plan Discussed with: CRNA, Surgeon and Anesthesiologist  Anesthesia Plan Comments:         Anesthesia Quick Evaluation

## 2014-09-14 NOTE — Brief Op Note (Signed)
09/14/2014  1:14 PM  PATIENT:  Isaiah Serge  53 y.o. female  PRE-OPERATIVE DIAGNOSIS:  LEFT SMALL PROXIMAL PHALANX FRACTURE   POST-OPERATIVE DIAGNOSIS:  LEFT SMALL PROXIMAL PHALANX FRACTURE   PROCEDURE:  Procedure(s): CLOSED REDUCTION PERCUTANEOUS PINNING  (Left)  SURGEON:  Surgeon(s) and Role:    * Leanora Cover, MD - Primary  PHYSICIAN ASSISTANT:   ASSISTANTS: none   ANESTHESIA:   regional and MAC  EBL:  Total I/O In: 700 [I.V.:700] Out: -   BLOOD ADMINISTERED:none  DRAINS: none   LOCAL MEDICATIONS USED:  NONE  SPECIMEN:  No Specimen  DISPOSITION OF SPECIMEN:  N/A  COUNTS:  YES  TOURNIQUET:   Total Tourniquet Time Documented: Upper Arm (Left) - 16 minutes Total: Upper Arm (Left) - 16 minutes   DICTATION: .Other Dictation: Dictation Number [id number]  PLAN OF CARE: Discharge to home after PACU  PATIENT DISPOSITION:  PACU - hemodynamically stable.

## 2014-09-14 NOTE — Progress Notes (Signed)
Assisted Dr. Crews with left, ultrasound guided, interscalene  block. Side rails up, monitors on throughout procedure. See vital signs in flow sheet. Tolerated Procedure well. 

## 2014-09-14 NOTE — Addendum Note (Signed)
Addendum  created 09/14/14 1601 by Willa Frater, CRNA   Modules edited: Anesthesia Medication Administration

## 2014-09-14 NOTE — Anesthesia Procedure Notes (Addendum)
Anesthesia Regional Block:  Supraclavicular block  Pre-Anesthetic Checklist: ,, timeout performed, Correct Patient, Correct Site, Correct Laterality, Correct Procedure, Correct Position, site marked, Risks and benefits discussed,  Surgical consent,  Pre-op evaluation,  At surgeon's request and post-op pain management  Laterality: Left and Upper  Prep: chloraprep       Needles:  Injection technique: Single-shot  Needle Type: Echogenic Stimulator Needle     Needle Length: 5cm 5 cm Needle Gauge: 21 and 21 G    Additional Needles:  Procedures: ultrasound guided (picture in chart) Supraclavicular block Narrative:  Start time: 09/14/2014 12:09 PM End time: 09/14/2014 12:14 PM Injection made incrementally with aspirations every 5 mL.  Performed by: Personally    Procedure Name: MAC Date/Time: 09/14/2014 12:42 PM Performed by: Melynda Ripple D Pre-anesthesia Checklist: Patient identified, Timeout performed, Emergency Drugs available, Suction available and Patient being monitored Patient Re-evaluated:Patient Re-evaluated prior to inductionOxygen Delivery Method: Simple face mask

## 2014-09-14 NOTE — Anesthesia Postprocedure Evaluation (Signed)
  Anesthesia Post-op Note  Patient: Rebekah [name]  Procedure(s) Performed: Procedure(s): CLOSED REDUCTION PERCUTANEOUS PINNING  (Left)  Patient Location: PACU  Anesthesia Type: General with SCB for post op pain  Level of Consciousness: awake, alert  and oriented  Airway and Oxygen Therapy: Patient Spontanous Breathing  Post-op Pain: none  Post-op Assessment: Post-op Vital signs reviewed  Post-op Vital Signs: Reviewed  Last Vitals:  Filed Vitals:   09/14/14 1330  BP: 124/72  Pulse: 93  Temp:   Resp: 19    Complications: No apparent anesthesia complications

## 2014-09-14 NOTE — Op Note (Signed)
[name], BEECHER NO.:  [id number]  MEDICAL RECORD NO.:  [id number]  LOCATION:                                 FACILITY:  PHYSICIAN:  Leanora Cover, MD        DATE OF BIRTH:  [birth year]  DATE OF PROCEDURE:  09/14/2014 DATE OF DISCHARGE:                              OPERATIVE REPORT   PREOPERATIVE DIAGNOSIS:  Left small finger proximal phalanx fracture.  POSTOPERATIVE DIAGNOSIS:  Left small finger proximal phalanx fracture.  PROCEDURE:  Closed reduction and percutaneous pinning, left small finger proximal phalanx fracture.  SURGEON:  Leanora Cover, MD.  ASSISTANT:  None.  ANESTHESIA:  Regional with MAC.  IV FLUIDS:  Per anesthesia flow sheet.  ESTIMATED BLOOD LOSS:  Minimal.  COMPLICATIONS:  None.  SPECIMENS:  None .  TOURNIQUET TIME:  16 minutes.  DISPOSITION:  Stable to PACU.  INDICATIONS:  [name] is a 53 year old female who states she was pulled over by her dog and fell onto her left hand last week.  She was seen at the emergency department where radiographs were taken revealing a proximal phalanx fracture in left small finger.  She was splinted and referred to me for further care.  Review of her radiographs showed dorsal angulation.  We discussed nonoperative and operative treatment options.  She wished to proceed with operative fixation.  Risks, benefits, and alternatives of surgery were discussed including risk of blood loss, infection, damage to nerves, vessels, tendons, ligaments, bone; failure of surgery; need for additional surgery, complications with wound healing, continued pain, nonunion, malunion, stiffness.  She voiced understanding of these risks and elected to proceed.  OPERATIVE COURSE:  After being identified preoperatively by myself, the patient and I agreed upon procedure and site of  procedure.  Surgical site was marked.  The risks, benefits, and alternatives of surgery were reviewed and she wished to proceed.  Surgical  consent had been signed. She given IV vancomycin as preoperative antibiotic prophylaxis due to PENICILLIN allergy.  Regional block was performed by Anesthesia in preoperative holding.  She was transferred to the operating room and placed on the operating room table in supine position and left upper extremity on arm board.  MAC anesthesia was induced.  Left upper extremity was prepped and draped in normal sterile orthopedic fashion. Surgical pause was performed between surgeons, anesthesia, operating staff, and all were in agreement as to the patient, procedure, and site of procedure.  Tourniquet at the proximal aspect of the extremity was inflated to 250 mmHg after exsanguination of the limb with an Esmarch bandage.  C-arm was used in AP, lateral, and oblique projections throughout the case.  A closed reduction of the left small finger proximal phalanx fracture was performed.  Two 0.035-inch K-wires were then advanced from the base of the proximal phalanx across the fracture site and into the distal aspect of the phalanx in a crossed fashion. This was adequate to stabilize the fracture.  The wrist was placed through a tenodesis and there was no scissoring.  The pins were bent and cut short.  The pin sites were dressed with sterile Xeroform, 4x4s, and wrapped with a Kerlix  bandage.  A volar and dorsal splint at the long, ring, and small fingers included with the MPs flexed and IPs extended was placed.  This was wrapped with Kerlix and Ace bandage.  Tourniquet inflated at 16 minutes.  The fingertips were pink with brisk capillary refill after deflation of tourniquet.  Operative drapes were broken down.  The patient was awoken from anesthesia safely.  She was transferred back to stretcher and taken to PACU in stable condition.  I will see her back in the office 1 week for postoperative followup.  I will give her Norco 5/325, 1-2 p.o. q.6 hours p.r.n. pain, dispensed #40.     Leanora Cover, MD     KK/MEDQ  D:  09/14/2014  T:  09/14/2014  Job:  [id number]

## 2014-09-14 NOTE — Op Note (Signed)
[id number] 

## 2014-09-14 NOTE — H&P (Signed)
  Rebekah [name] is an 53 y.o. female.   Chief Complaint: proximal phalanx fracture HPI: 53 yo rhd female states she was pulled over by her dog 09/10/14 onto left hand.  Suffered hyperextension to small finger.  Seen at [facility] where XR revealed proximal phalanx fracture left small finger.  Followed up in office.  Reports no previous injury to left hand.  Past Medical History  Diagnosis Date  . GERD (gastroesophageal reflux disease)   . Hypertension   . Hyperlipidemia   . OSA (obstructive sleep apnea)     using a CPAP    . Obesity     Lap band surgery 12-08 Dr Hassell Done  . Depression   . Allergy     Past Surgical History  Procedure Laterality Date  . Laparoscopic gastric banding      Lap band surgery 12-08 Dr Hassell Done  . Cholecystectomy    . Appendectomy    . Nasal sinus surgery    . Breast reduction surgery    . Tonsillectomy      x2    Family History  Problem Relation Age of Onset  . Diabetes Neg Hx   . Colon cancer Neg Hx   . Breast cancer Neg Hx   . Coronary artery disease Father     CABG, smoker .age onset 75s  . Heart disease Mother     CABG, smoker .age onset 69s  . Diverticulitis Mother   . Prostate cancer Father    Social History:  reports that she has never smoked. She has never used smokeless tobacco. She reports that she drinks alcohol. She reports that she does not use illicit drugs.  Allergies:  Allergies  Allergen Reactions  . Penicillins     REACTION: rash  . Erythromycin     Gastrointestinal issues.    No prescriptions prior to admission    No results found for this or any previous visit (from the past 48 hour(s)).  No results found.   A comprehensive review of systems was negative except for: Eyes: positive for contacts/glasses Behavioral/Psych: positive for depression  Height 5\' 4"  (1.626 m), weight 90.266 kg (199 lb).  General appearance: alert, cooperative and appears stated age Head: Normocephalic, without obvious abnormality,  atraumatic Neck: supple, symmetrical, trachea midline Resp: clear to auscultation bilaterally Cardio: regular rate and rhythm GI: non tender Extremities: intact sensation and capillary refill all digits.  +epl/fpl/io.  no wounds. Pulses: 2+ and symmetric Skin: Skin color, texture, turgor normal. No rashes or lesions Neurologic: Grossly normal Incision/Wound: none  Assessment/Plan Left small finger proximal phalanx fracture with angulation.  Non operative and operative treatment options were discussed with the patient and patient wishes to proceed with operative treatment. She wishes to proceed with operative fixation of the fracture.  Risks, benefits, and alternatives of surgery were discussed and the patient agrees with the plan of care.   Imanuel Pruiett R 09/14/2014, 9:12 AM

## 2014-09-14 NOTE — Transfer of Care (Signed)
Immediate Anesthesia Transfer of Care Note  Patient: Rebekah [name]  Procedure(s) Performed: Procedure(s): CLOSED REDUCTION PERCUTANEOUS PINNING  (Left)  Patient Location: PACU  Anesthesia Type:MAC and Regional  Level of Consciousness: awake and alert   Airway & Oxygen Therapy: Patient Spontanous Breathing  Post-op Assessment: Report given to PACU RN and Post -op Vital signs reviewed and stable  Post vital signs: Reviewed and stable  Complications: No apparent anesthesia complications

## 2014-09-14 NOTE — Discharge Instructions (Addendum)
[facility] Instructions °Hand Surgery ° °Wound Care: °Keep your hand elevated above the level of your heart.  Do not allow it to dangle by your side.  Keep the dressing dry and do not remove it unless your doctor advises you to do so.  He will usually change it at the time of your post-op visit.  Moving your fingers is advised to stimulate circulation but will depend on the site of your surgery.  If you have a splint applied, your doctor will advise you regarding movement. ° °Activity: °Do not drive or operate machinery today.  Rest today and then you may return to your normal activity and work as indicated by your physician. ° °Diet:  °Drink liquids today or eat a light diet.  You may resume a regular diet tomorrow.   ° °General expectations: °Pain for two to three days. °Fingers may become slightly swollen. ° °Call your doctor if any of the following occur: °Severe pain not relieved by pain medication. °Elevated temperature. °Dressing soaked with blood. °Inability to move fingers. °White or bluish color to fingers. ° ° °Post Anesthesia Home Care Instructions ° °Activity: °Get plenty of rest for the remainder of the day. A responsible adult should stay with you for 24 hours following the procedure.  °For the next 24 hours, DO NOT: °-Drive a car °-Operate machinery °-Drink alcoholic beverages °-Take any medication unless instructed by your physician °-Make any legal decisions or sign important papers. ° °Meals: °Start with liquid foods such as gelatin or soup. Progress to regular foods as tolerated. Avoid greasy, spicy, heavy foods. If nausea and/or vomiting occur, drink only clear liquids until the nausea and/or vomiting subsides. Call your physician if vomiting continues. ° °Special Instructions/Symptoms: °Your throat may feel dry or sore from the anesthesia or the breathing tube placed in your throat during surgery. If this causes discomfort, gargle with warm salt water. The discomfort should disappear within 24  hours. ° ° °Regional Anesthesia Blocks ° °1. Numbness or the inability to move the "blocked" extremity may last from 3-48 hours after placement. The length of time depends on the medication injected and your individual response to the medication. If the numbness is not going away after 48 hours, call your surgeon. ° °2. The extremity that is blocked will need to be protected until the numbness is gone and the  Strength has returned. Because you cannot feel it, you will need to take extra care to avoid injury. Because it may be weak, you may have difficulty moving it or using it. You may not know what position it is in without looking at it while the block is in effect. ° °3. For blocks in the legs and feet, returning to weight bearing and walking needs to be done carefully. You will need to wait until the numbness is entirely gone and the strength has returned. You should be able to move your leg and foot normally before you try and bear weight or walk. You will need someone to be with you when you first try to ensure you do not fall and possibly risk injury. ° °4. Bruising and tenderness at the needle site are common side effects and will resolve in a few days. ° °5. Persistent numbness or new problems with movement should be communicated to the surgeon or the [facility] ([phone])/ [facility] ([phone]). °

## 2014-09-15 ENCOUNTER — Encounter (HOSPITAL_BASED_OUTPATIENT_CLINIC_OR_DEPARTMENT_OTHER): Payer: Self-pay | Admitting: Orthopedic Surgery

## 2014-09-30 ENCOUNTER — Other Ambulatory Visit: Payer: Self-pay | Admitting: Internal Medicine

## 2014-12-27 ENCOUNTER — Ambulatory Visit (INDEPENDENT_AMBULATORY_CARE_PROVIDER_SITE_OTHER): Payer: Managed Care, Other (non HMO) | Admitting: Internal Medicine

## 2014-12-27 ENCOUNTER — Encounter: Payer: Self-pay | Admitting: Internal Medicine

## 2014-12-27 VITALS — BP 128/74 | HR 87 | Temp 98.0°F | Wt 197.1 lb

## 2014-12-27 DIAGNOSIS — M545 Low back pain, unspecified: Secondary | ICD-10-CM

## 2014-12-27 MED ORDER — CYCLOBENZAPRINE HCL 10 MG PO TABS: 10.0000 mg | ORAL_TABLET | Freq: Every evening | ORAL | Status: DC | PRN

## 2014-12-27 MED ORDER — PREDNISONE 10 MG PO TABS: ORAL_TABLET | ORAL | Status: DC

## 2014-12-27 MED ORDER — HYDROCODONE-ACETAMINOPHEN 5-325 MG PO TABS: ORAL_TABLET | ORAL | Status: DC

## 2014-12-27 NOTE — Patient Instructions (Signed)
Rest, no heavy lifting, use a heating pad Hydrocodone as needed for pain,   may cause drowsiness Prednisone as prescribed for few days Flexeril at bedtime, this is a muscle relaxant, may cause drowsiness.  If the symptoms increase or are not improving in the next few days please let us know

## 2014-12-27 NOTE — Progress Notes (Signed)
Pre visit review using our clinic review tool, if applicable. No additional management support is needed unless otherwise documented below in the visit note. 

## 2014-12-27 NOTE — Progress Notes (Signed)
Subjective:    Patient ID: Rebekah [name], female    DOB: [birth year], 54 y.o.   MRN: [id number]  DOS:  12/27/2014 Type of visit - description : acute Interval history: Symptoms started a week ago suddenly when she picked up her 75 pound dog, immediately felt low back pain, bilateral, no radiation, no paresthesias. She started taking a leftover hydrocodone which helped. After few days she felt gradually better however yesterday she did do some yard work and symptoms are back, same location, feels like a spasm   Review of Systems  No fever chills No dysuria, gross hematuria difficulty urinating Recently was seen with anxiety, overall doing better.  Past Medical History  Diagnosis Date  . GERD (gastroesophageal reflux disease)   . Hypertension   . Hyperlipidemia   . OSA (obstructive sleep apnea)     using a CPAP    . Obesity     Lap band surgery 12-08 Dr Hassell Done  . Depression   . Allergy     Past Surgical History  Procedure Laterality Date  . Laparoscopic gastric banding      Lap band surgery 12-08 Dr Hassell Done  . Cholecystectomy    . Appendectomy    . Nasal sinus surgery    . Breast reduction surgery    . Tonsillectomy      x2  . Closed reduction finger with percutaneous pinning Left 09/14/2014    Procedure: CLOSED REDUCTION PERCUTANEOUS PINNING ;  Surgeon: Leanora Cover, MD;  Location: [facility];  Service: Orthopedics;  Laterality: Left;    History   Social History  . Marital Status: Married    Spouse Name: N/A  . Number of Children: 0  . Years of Education: N/A   Occupational History  . [place] History Main Topics  . Smoking status: Never Smoker   . Smokeless tobacco: Never Used  . Alcohol Use: Yes     Comment: socially   . Drug Use: No  . Sexual Activity: Not on file   Other Topics Concern  . Not on file   Social History Narrative   Lives w/ husband        Medication List       This list is accurate as of:  12/27/14 11:28 AM.  Always use your most recent med list.               aspirin 81 MG tablet  Take 1 tablet (81 mg total) by mouth daily.     atorvastatin 10 MG tablet  Commonly known as:  LIPITOR  Take 1 tablet (10 mg total) by mouth daily.     bacitracin ointment  Apply 1 application topically 2 (two) times daily.     clonazePAM 0.5 MG tablet  Commonly known as:  KLONOPIN  Take 1 tablet (0.5 mg total) by mouth 2 (two) times daily as needed for anxiety.     DEPLIN 15 MG Tabs  Take one-half tablet by mouth one time daily     HYDROcodone-acetaminophen 5-325 MG per tablet  Commonly known as:  NORCO  1-2 tabs po q6 hours prn pain     lisinopril 10 MG tablet  Commonly known as:  PRINIVIL,ZESTRIL  Take 1 tablet (10 mg total) by mouth daily.     multivitamin tablet  Take 1 tablet by mouth daily.     NON FORMULARY  BCP per gyn     nortriptyline 75 MG capsule  Commonly known  as:  PAMELOR  Take 1 capsule (75 mg total) by mouth daily.     omeprazole 40 MG capsule  Commonly known as:  PRILOSEC  Take 1 capsule (40 mg total) by mouth daily.     ranitidine 150 MG tablet  Commonly known as:  ZANTAC  Take 150 mg by mouth 2 (two) times daily.     TUMS PO  Take 3 tablets by mouth 3 (three) times daily as needed. stomach           Objective:   Physical Exam BP 128/74 mmHg  Pulse 87  Temp(Src) 98 F (36.7 C) (Oral)  Wt 197 lb 2 oz (89.415 kg)  SpO2 95% General:   Well developed, well nourished . NAD.  HEENT:  Normocephalic . Face symmetric, atraumatic abd-- not distended, soft, nontender Muscle skeletal: no pretibial edema bilaterally  Skin: Not pale. Not jaundice Back: No TTP at the thoracic, lumbar spine or SI areas Neurologic:  alert & oriented X3.  Speech normal, gait appropriate for age and unassisted; she did adopt a antalgic posture when she was trying to get up from the examining table. Motor exam and DTRs symmetric Straight leg test negative Psych--    Cognition and judgment appear intact.  Cooperative with normal attention span and concentration.  Behavior appropriate. No anxious or depressed appearing.        Assessment & Plan:    Back sprain, Symptoms consistent with a back sprain, don't see red flag symptom at this point. Will try conservative treatment with hydrocodone, prednisone, Flexeril, rest. If not better she will let me know. See instructions She has a follow-up in 2 weeks and we will discuss the situation further then if symptoms not resolved

## 2015-01-07 ENCOUNTER — Ambulatory Visit (INDEPENDENT_AMBULATORY_CARE_PROVIDER_SITE_OTHER): Payer: Managed Care, Other (non HMO) | Admitting: Internal Medicine

## 2015-01-07 ENCOUNTER — Encounter: Payer: Self-pay | Admitting: Internal Medicine

## 2015-01-07 VITALS — BP 124/68 | HR 90 | Temp 98.0°F | Ht 64.0 in | Wt 193.5 lb

## 2015-01-07 DIAGNOSIS — F418 Other specified anxiety disorders: Secondary | ICD-10-CM

## 2015-01-07 DIAGNOSIS — M545 Low back pain, unspecified: Secondary | ICD-10-CM

## 2015-01-07 DIAGNOSIS — F329 Major depressive disorder, single episode, unspecified: Secondary | ICD-10-CM

## 2015-01-07 DIAGNOSIS — F32A Depression, unspecified: Secondary | ICD-10-CM

## 2015-01-07 DIAGNOSIS — F419 Anxiety disorder, unspecified: Principal | ICD-10-CM

## 2015-01-07 DIAGNOSIS — D649 Anemia, unspecified: Secondary | ICD-10-CM

## 2015-01-07 LAB — FERRITIN: FERRITIN: 19.1 ng/mL (ref 10.0–291.0)

## 2015-01-07 LAB — IRON: Iron: 86 ug/dL (ref 42–145)

## 2015-01-07 LAB — HEMOGLOBIN: Hemoglobin: 13.5 g/dL (ref 12.0–15.0)

## 2015-01-07 NOTE — Patient Instructions (Signed)
Get your blood work before you leave   [employer] with information about home physical therapy for back pain: [email or web address]  Check the  blood pressure 2 or 3 times a month   Be sure your blood pressure is between 110/65 and  145/85.  if it is consistently higher or lower, let me know      Come back to the office by 08-2015  for a physical exam  Please schedule an appointment at the front desk    Come back fasting

## 2015-01-07 NOTE — Assessment & Plan Note (Signed)
Since the last visit, she is doing better without any change in her medications. The patient is counseled. Again most of the stress is from work. Plan: Continue with present care, she takes clonazepam sporadically, get a UDS (has not taking Klonopin months)

## 2015-01-07 NOTE — Progress Notes (Signed)
Subjective:    Patient ID: Rebekah [name], female    DOB: [birth year], 54 y.o.   MRN: [id number]  DOS:  01/07/2015 Type of visit - description : rov Interval history: Anxiety depression, since the last time we discussed the issue December 2015, she is actually better taking the same medications. Was recently seen with back pain, much improved, still taking Flexeril Last labs show mild anemia. Will repeat labs today.  Review of Systems Denies nausea, vomiting, diarrhea or blood in the stools. No suicidal ideas  Past Medical History  Diagnosis Date  . GERD (gastroesophageal reflux disease)   . Hypertension   . Hyperlipidemia   . OSA (obstructive sleep apnea)     using a CPAP    . Obesity     Lap band surgery 12-08 Dr Hassell Done  . Depression   . Allergy     Past Surgical History  Procedure Laterality Date  . Laparoscopic gastric banding      Lap band surgery 12-08 Dr Hassell Done  . Cholecystectomy    . Appendectomy    . Nasal sinus surgery    . Breast reduction surgery    . Tonsillectomy      x2  . Closed reduction finger with percutaneous pinning Left 09/14/2014    Procedure: CLOSED REDUCTION PERCUTANEOUS PINNING ;  Surgeon: Leanora Cover, MD;  Location: [facility];  Service: Orthopedics;  Laterality: Left;    History   Social History  . Marital Status: Married    Spouse Name: N/A  . Number of Children: 0  . Years of Education: N/A   Occupational History  . [place] History Main Topics  . Smoking status: Never Smoker   . Smokeless tobacco: Never Used  . Alcohol Use: Yes     Comment: socially   . Drug Use: No  . Sexual Activity: Not on file   Other Topics Concern  . Not on file   Social History Narrative   Lives w/ husband        Medication List       This list is accurate as of: 01/07/15 11:59 PM.  Always use your most recent med list.               aspirin 81 MG tablet  Take 1 tablet (81 mg total) by mouth  daily.     atorvastatin 10 MG tablet  Commonly known as:  LIPITOR  Take 1 tablet (10 mg total) by mouth daily.     clonazePAM 0.5 MG tablet  Commonly known as:  KLONOPIN  Take 1 tablet (0.5 mg total) by mouth 2 (two) times daily as needed for anxiety.     cyclobenzaprine 10 MG tablet  Commonly known as:  FLEXERIL  Take 1 tablet (10 mg total) by mouth at bedtime as needed for muscle spasms.     DEPLIN 15 MG Tabs  Take one-half tablet by mouth one time daily     HYDROcodone-acetaminophen 5-325 MG per tablet  Commonly known as:  NORCO  1-2 tabs po q6 hours prn pain     lisinopril 10 MG tablet  Commonly known as:  PRINIVIL,ZESTRIL  Take 1 tablet (10 mg total) by mouth daily.     multivitamin tablet  Take 1 tablet by mouth daily.     NON FORMULARY  BCP per gyn, estrogen only     nortriptyline 75 MG capsule  Commonly known as:  PAMELOR  Take 1 capsule (  75 mg total) by mouth daily.     omeprazole 40 MG capsule  Commonly known as:  PRILOSEC  Take 1 capsule (40 mg total) by mouth daily.     ranitidine 150 MG tablet  Commonly known as:  ZANTAC  Take 150 mg by mouth 2 (two) times daily.     TUMS PO  Take 3 tablets by mouth 3 (three) times daily as needed. stomach           Objective:   Physical Exam BP 124/68 mmHg  Pulse 90  Temp(Src) 98 F (36.7 C) (Oral)  Ht 5\' 4"  (1.626 m)  Wt 193 lb 8 oz (87.771 kg)  BMI 33.20 kg/m2  SpO2 99% General:   Well developed, well nourished . NAD.  HEENT:  Normocephalic . Face symmetric, atraumatic Skin: Not pale. Not jaundice Neurologic:  alert & oriented X3.  Speech normal, gait appropriate for age and unassisted Psych--  Cognition and judgment appear intact.  Cooperative with normal attention span and concentration.  Behavior appropriate. No anxious or depressed appearing.       Assessment & Plan:    Mild anemia, GI review of systems negative, check labs.  Back pain Was seen recently with back pain, improving,  still taking Flexeril,. Plan:  Recommend stretching and self physical therapy, see instructions. Okay to take Flexeril and hydrocodone as needed.

## 2015-01-07 NOTE — Progress Notes (Signed)
Pre visit review using our clinic review tool, if applicable. No additional management support is needed unless otherwise documented below in the visit note. 

## 2015-07-25 ENCOUNTER — Telehealth: Payer: Self-pay | Admitting: Internal Medicine

## 2015-07-25 MED ORDER — OMEPRAZOLE 40 MG PO CPDR
40.0000 mg | DELAYED_RELEASE_CAPSULE | Freq: Every day | ORAL | Status: DC
Start: 2015-07-25 — End: 2015-09-27

## 2015-07-25 NOTE — Telephone Encounter (Signed)
°  Relation to TT:SVXB Call back number:[phone] Pharmacy:[facility]  Reason for call: pt states she is needing a rx for omeprazole (PRILOSEC) 40 MG capsule , states she has appt for her CPE on 12/2, and her last visit was in March.

## 2015-07-25 NOTE — Telephone Encounter (Signed)
Rx sent 

## 2015-08-03 LAB — LIPID PANEL
CHOLESTEROL: 154 mg/dL (ref 0–200)
HDL: 48 mg/dL (ref 35–70)
LDL CALC: 87 mg/dL
LDl/HDL Ratio: 3.2
TRIGLYCERIDES: 96 mg/dL (ref 40–160)

## 2015-08-03 LAB — BASIC METABOLIC PANEL: Glucose: 84 mg/dL

## 2015-09-01 HISTORY — PX: OTHER SURGICAL HISTORY: SHX169

## 2015-09-22 ENCOUNTER — Encounter: Payer: Self-pay | Admitting: Behavioral Health

## 2015-09-22 ENCOUNTER — Telehealth: Payer: Self-pay | Admitting: Behavioral Health

## 2015-09-22 NOTE — Telephone Encounter (Signed)
Pre-Visit Call completed with patient and chart updated.   Pre-Visit Info documented in Specialty Comments under [employer].    

## 2015-09-23 ENCOUNTER — Encounter: Payer: Self-pay | Admitting: Internal Medicine

## 2015-09-23 ENCOUNTER — Ambulatory Visit (INDEPENDENT_AMBULATORY_CARE_PROVIDER_SITE_OTHER): Payer: Managed Care, Other (non HMO) | Admitting: Internal Medicine

## 2015-09-23 VITALS — BP 122/64 | HR 74 | Temp 98.0°F | Ht 64.0 in | Wt 190.1 lb

## 2015-09-23 DIAGNOSIS — Z23 Encounter for immunization: Secondary | ICD-10-CM | POA: Diagnosis not present

## 2015-09-23 DIAGNOSIS — Z0000 Encounter for general adult medical examination without abnormal findings: Secondary | ICD-10-CM | POA: Diagnosis not present

## 2015-09-23 DIAGNOSIS — Z09 Encounter for follow-up examination after completed treatment for conditions other than malignant neoplasm: Secondary | ICD-10-CM

## 2015-09-23 DIAGNOSIS — R7303 Prediabetes: Secondary | ICD-10-CM

## 2015-09-23 DIAGNOSIS — Z114 Encounter for screening for human immunodeficiency virus [HIV]: Secondary | ICD-10-CM

## 2015-09-23 DIAGNOSIS — Z1159 Encounter for screening for other viral diseases: Secondary | ICD-10-CM

## 2015-09-23 LAB — COMPREHENSIVE METABOLIC PANEL
ALT: 20 U/L (ref 0–35)
AST: 19 U/L (ref 0–37)
Albumin: 4.1 g/dL (ref 3.5–5.2)
Alkaline Phosphatase: 83 U/L (ref 39–117)
BILIRUBIN TOTAL: 0.5 mg/dL (ref 0.2–1.2)
BUN: 9 mg/dL (ref 6–23)
CHLORIDE: 108 meq/L (ref 96–112)
CO2: 24 meq/L (ref 19–32)
CREATININE: 0.84 mg/dL (ref 0.40–1.20)
Calcium: 9.5 mg/dL (ref 8.4–10.5)
GFR: 75.35 mL/min (ref >60.00)
Glucose, Bld: 82 mg/dL (ref 70–99)
Potassium: 3.9 mEq/L (ref 3.5–5.1)
SODIUM: 142 meq/L (ref 135–145)
Total Protein: 7.7 g/dL (ref 6.0–8.3)

## 2015-09-23 LAB — HEMOGLOBIN A1C: Hgb A1c MFr Bld: 6 % (ref 4.6–6.5)

## 2015-09-23 LAB — CBC WITH DIFFERENTIAL/PLATELET
BASOS ABS: 0 10*3/uL (ref 0.0–0.1)
Basophils Relative: 0.4 % (ref 0.0–3.0)
Eosinophils Absolute: 0.3 10*3/uL (ref 0.0–0.7)
Eosinophils Relative: 2.9 % (ref 0.0–5.0)
HCT: 38.7 % (ref 36.0–46.0)
Hemoglobin: 12.6 g/dL (ref 12.0–15.0)
LYMPHS ABS: 2.9 10*3/uL (ref 0.7–4.0)
Lymphocytes Relative: 30.5 % (ref 12.0–46.0)
MCHC: 32.6 g/dL (ref 30.0–36.0)
MCV: 83.7 fl (ref 78.0–100.0)
MONO ABS: 0.4 10*3/uL (ref 0.1–1.0)
MONOS PCT: 4.4 % (ref 3.0–12.0)
NEUTROS ABS: 5.9 10*3/uL (ref 1.4–7.7)
NEUTROS PCT: 61.8 % (ref 43.0–77.0)
PLATELETS: 316 10*3/uL (ref 150.0–400.0)
RBC: 4.62 Mil/uL (ref 3.87–5.11)
RDW: 17.4 % — ABNORMAL HIGH (ref 11.5–15.5)
WBC: 9.6 10*3/uL (ref 4.0–10.5)

## 2015-09-23 LAB — LIPID PANEL
CHOL/HDL RATIO: 3
Cholesterol: 179 mg/dL (ref 0–200)
HDL: 53.8 mg/dL (ref >39.00)
LDL CALC: 108 mg/dL — AB (ref 0–99)
NonHDL: 125.5
TRIGLYCERIDES: 90 mg/dL (ref 0.0–149.0)
VLDL: 18 mg/dL (ref 0.0–40.0)

## 2015-09-23 LAB — HEPATITIS C ANTIBODY: HCV Ab: NEGATIVE

## 2015-09-23 LAB — TSH: TSH: 3.7 u[IU]/mL (ref 0.35–4.50)

## 2015-09-23 NOTE — Progress Notes (Signed)
Subjective:    Patient ID: Rebekah [name], female    DOB: [birth year], 54 y.o.   MRN: [id number]  DOS:  09/23/2015 Type of visit - description : CPX Interval history: No major concerns  Wt Readings from Last 3 Encounters:  09/23/15 190 lb 2 oz (86.24 kg)  01/07/15 193 lb 8 oz (87.771 kg)  12/27/14 197 lb 2 oz (89.415 kg)     Review of Systems Constitutional: No fever. No chills. No unexplained wt changes. No unusual sweats  HEENT: No dental problems, no ear discharge, no facial swelling, no voice changes. No eye discharge, no eye  redness , no  intolerance to light   Respiratory: No wheezing , no  difficulty breathing. No cough , no mucus production  Cardiovascular: No CP, no leg swelling , no  Palpitations  GI: no nausea, no vomiting, no diarrhea , no  abdominal pain.  No blood in the stools. No dysphagia, no odynophagia    Endocrine: No polyphagia, no polyuria , no polydipsia  GU: No dysuria, gross hematuria, difficulty urinating. No urinary urgency, no frequency.  Musculoskeletal: No joint swellings or unusual aches or pains  Skin: No change in the color of the skin, palor , no  Rash  Allergic, immunologic: No environmental allergies , no  food allergies  Neurological: No dizziness no  syncope. No headaches. No diplopia, no slurred, no slurred speech, no motor deficits, no facial  Numbness  Hematological: No enlarged lymph nodes, no easy bruising , no unusual bleedings  Psychiatry: No suicidal ideas, no hallucinations, no beavior problems, no confusion.  Continue with moderate stress at work but handling well  Past Medical History  Diagnosis Date  . GERD (gastroesophageal reflux disease)   . Hypertension   . Hyperlipidemia   . OSA (obstructive sleep apnea)     using a CPAP    . Obesity     Lap band surgery 12-08 Dr Hassell Done  . Depression   . Allergy     Past Surgical History  Procedure Laterality Date  . Laparoscopic gastric banding  09/2007    Lap band  surgery 12-08 Dr Hassell Done  . Cholecystectomy    . Appendectomy    . Nasal sinus surgery    . Breast reduction surgery    . Tonsillectomy      x2  . Closed reduction finger with percutaneous pinning Left 09/14/2014    Procedure: CLOSED REDUCTION PERCUTANEOUS PINNING ;  Surgeon: Leanora Cover, MD;  Location: [facility];  Service: Orthopedics;  Laterality: Left;  . Lap band adjustment  09/01/2015    Dr. Redmond Pulling    Social History   Social History  . Marital Status: Married    Spouse Name: N/A  . Number of Children: 0  . Years of Education: N/A   Occupational History  . [employer], works from home    Social History Main Topics  . Smoking status: Never Smoker   . Smokeless tobacco: Never Used  . Alcohol Use: Yes     Comment: socially   . Drug Use: No  . Sexual Activity: Not on file   Other Topics Concern  . Not on file   Social History Narrative   Lives w/ husband     Family History  Problem Relation Age of Onset  . Diabetes Neg Hx   . Colon cancer Neg Hx   . Breast cancer Mother     age 43  . Coronary artery disease Father  CABG, smoker .age onset 79s  . Prostate cancer Father   . Pulmonary fibrosis Father     and others  . Heart disease Mother     CABG, smoker .age onset 51s  . Diverticulitis Mother   . Cancer Mother     Breast cancer  . Heart attack Brother     age 68       Medication List       This list is accurate as of: 09/23/15 11:59 PM.  Always use your most recent med list.               aspirin 81 MG tablet  Take 1 tablet (81 mg total) by mouth daily.     atorvastatin 10 MG tablet  Commonly known as:  LIPITOR  Take 1 tablet (10 mg total) by mouth daily.     DEPLIN 15 MG Tabs  Take one-half tablet by mouth one time daily     doxycycline 100 MG EC tablet  Commonly known as:  DORYX  Take 100 mg by mouth daily as needed.     JINTELI PO  Take 1 mg by mouth daily.     lisinopril 10 MG tablet  Commonly known as:   PRINIVIL,ZESTRIL  Take 1 tablet (10 mg total) by mouth daily.     multivitamin tablet  Take 1 tablet by mouth daily.     nortriptyline 75 MG capsule  Commonly known as:  PAMELOR  Take 1 capsule (75 mg total) by mouth daily.     omeprazole 40 MG capsule  Commonly known as:  PRILOSEC  Take 1 capsule (40 mg total) by mouth daily.     ranitidine 150 MG tablet  Commonly known as:  ZANTAC  Take 150 mg by mouth 2 (two) times daily.           Objective:   Physical Exam BP 122/64 mmHg  Pulse 74  Temp(Src) 98 F (36.7 C) (Oral)  Ht 5\' 4"  (1.626 m)  Wt 190 lb 2 oz (86.24 kg)  BMI 32.62 kg/m2  SpO2 99% General:   Well developed, well nourished . NAD.  HEENT:  Normocephalic . Face symmetric, atraumatic Neck: No thyromegaly Lungs:  CTA B Normal respiratory effort, no intercostal retractions, no accessory muscle use. Heart: RRR,  no murmur.  no pretibial edema bilaterally  Abdomen:  Not distended, soft, non-tender. No rebound or rigidity. No mass,organomegaly Skin: Not pale. Not jaundice Neurologic:  alert & oriented X3.  Speech normal, gait appropriate for age and unassisted Psych--  Cognition and judgment appear intact.  Cooperative with normal attention span and concentration.  Behavior appropriate. No anxious or depressed appearing.    Assessment & Plan:   Assessment > Prediabetes HTN Hyperlipidemia Anxiety, depression GERD Obesity, lap band surgery 2008 OSA CPAP Acne (doxy) ++FH CAD: F, M , B MI age 89, G-parents   Plan: Prediabetes: Check the A1c HTN: Seems well-controlled Hyperlipidemia: Continue statins, check labs + Family history of CAD: Continue aspirin, Lipitor and controlling cardiovascular risk factors. RTC 6-8 months

## 2015-09-23 NOTE — Assessment & Plan Note (Addendum)
Td 6-14 got a flu shot already  shingles shot-- discussed likes to check her insurance coverage  Pneumonia shot today, Prevnar next year  Female care per  Gynecology, last visit 09-22-15  MMG- last 09-22-15 per pt  [facility]--08/2012--adenomatous polyp--Dr Stark--next 08/2017  Diet: Working with her bariatric surgeons, doing better. Exercise: Discussed, strongly encourage routine exercise Labs: CMP, FLP, CBC, A1c, TSH, hep C, HIV

## 2015-09-23 NOTE — Patient Instructions (Signed)
Get your blood work before you leave     Check the  blood pressure  monthly   Be sure your blood pressure is between 110/65 and  145/85.  if it is consistently higher or lower, let me know   Next visit  for a  routine checkup in 6-8 months  (15 minutes) Please schedule an appointment at the front desk

## 2015-09-23 NOTE — Progress Notes (Signed)
Pre visit review using our clinic review tool, if applicable. No additional management support is needed unless otherwise documented below in the visit note. 

## 2015-09-25 DIAGNOSIS — Z09 Encounter for follow-up examination after completed treatment for conditions other than malignant neoplasm: Secondary | ICD-10-CM | POA: Insufficient documentation

## 2015-09-25 NOTE — Assessment & Plan Note (Signed)
Prediabetes: Check the A1c HTN: Seems well-controlled Hyperlipidemia: Continue statins, check labs + Family history of CAD: Continue aspirin, Lipitor and controlling cardiovascular risk factors. RTC 6-8 months

## 2015-09-26 ENCOUNTER — Encounter: Payer: Self-pay | Admitting: Internal Medicine

## 2015-09-26 LAB — HIV ANTIBODY (ROUTINE TESTING W REFLEX): HIV: NONREACTIVE

## 2015-09-27 ENCOUNTER — Other Ambulatory Visit: Payer: Self-pay

## 2015-09-27 MED ORDER — NORTRIPTYLINE HCL 75 MG PO CAPS: 75.0000 mg | ORAL_CAPSULE | Freq: Every day | ORAL | Status: DC

## 2015-09-27 MED ORDER — OMEPRAZOLE 40 MG PO CPDR: 40.0000 mg | DELAYED_RELEASE_CAPSULE | Freq: Every day | ORAL | Status: DC

## 2015-09-27 MED ORDER — ATORVASTATIN CALCIUM 10 MG PO TABS: 10.0000 mg | ORAL_TABLET | Freq: Every day | ORAL | Status: DC

## 2015-09-27 MED ORDER — LISINOPRIL 10 MG PO TABS: 10.0000 mg | ORAL_TABLET | Freq: Every day | ORAL | Status: DC

## 2015-09-27 MED ORDER — DEPLIN 15 MG PO TABS: 7.5000 mg | ORAL_TABLET | Freq: Every day | ORAL | Status: DC

## 2015-12-12 ENCOUNTER — Other Ambulatory Visit: Payer: Self-pay | Admitting: Internal Medicine

## 2016-04-05 ENCOUNTER — Other Ambulatory Visit: Payer: Self-pay | Admitting: Internal Medicine

## 2016-05-03 ENCOUNTER — Other Ambulatory Visit: Payer: Self-pay | Admitting: Internal Medicine

## 2016-06-14 ENCOUNTER — Ambulatory Visit (INDEPENDENT_AMBULATORY_CARE_PROVIDER_SITE_OTHER): Payer: Managed Care, Other (non HMO) | Admitting: Internal Medicine

## 2016-06-14 ENCOUNTER — Encounter: Payer: Self-pay | Admitting: Internal Medicine

## 2016-06-14 VITALS — BP 118/76 | HR 86 | Temp 98.7°F | Resp 14 | Ht 64.0 in | Wt 193.5 lb

## 2016-06-14 DIAGNOSIS — Z23 Encounter for immunization: Secondary | ICD-10-CM | POA: Diagnosis not present

## 2016-06-14 DIAGNOSIS — Z0000 Encounter for general adult medical examination without abnormal findings: Secondary | ICD-10-CM

## 2016-06-14 DIAGNOSIS — M791 Myalgia, unspecified site: Secondary | ICD-10-CM

## 2016-06-14 DIAGNOSIS — E119 Type 2 diabetes mellitus without complications: Secondary | ICD-10-CM | POA: Diagnosis not present

## 2016-06-14 LAB — LIPID PANEL
CHOL/HDL RATIO: 3
Cholesterol: 156 mg/dL (ref 0–200)
HDL: 48.1 mg/dL (ref >39.00)
LDL Cholesterol: 89 mg/dL (ref 0–99)
NONHDL: 107.93
Triglycerides: 93 mg/dL (ref 0.0–149.0)
VLDL: 18.6 mg/dL (ref 0.0–40.0)

## 2016-06-14 LAB — BASIC METABOLIC PANEL
BUN: 13 mg/dL (ref 6–23)
CALCIUM: 9.1 mg/dL (ref 8.4–10.5)
CO2: 27 meq/L (ref 19–32)
Chloride: 105 mEq/L (ref 96–112)
Creatinine, Ser: 0.93 mg/dL (ref 0.40–1.20)
GFR: 66.82 mL/min (ref >60.00)
GLUCOSE: 78 mg/dL (ref 70–99)
POTASSIUM: 4.7 meq/L (ref 3.5–5.1)
SODIUM: 138 meq/L (ref 135–145)

## 2016-06-14 LAB — SEDIMENTATION RATE: Sed Rate: 18 mm/hr (ref 0–30)

## 2016-06-14 LAB — HEMOGLOBIN A1C: HEMOGLOBIN A1C: 6 % (ref 4.6–6.5)

## 2016-06-14 LAB — CK: Total CK: 92 U/L (ref 7–177)

## 2016-06-14 LAB — TSH: TSH: 2.51 u[IU]/mL (ref 0.35–4.50)

## 2016-06-14 NOTE — Progress Notes (Signed)
Pre visit review using our clinic review tool, if applicable. No additional management support is needed unless otherwise documented below in the visit note. 

## 2016-06-14 NOTE — Assessment & Plan Note (Signed)
Prediabetes: On diet only, recheck a A1c. HTN: Seems controlled, continue lisinopril High cholesterol: On Lipitor. Joint pains: As described above, will check a sedimentation rate, CK. Consider trial without Lipitor. Depression: Well controlled? Recently she talk to a health coach was recommended to see psychiatry. On further questions, states that she is feeling the best she has been feeling emotionally in the last few years yet from time to time when she gets upset suicidal ideas cross her mind. She has no plan or persistent thoughts, her husband owns a gun, no previous attempts . Also during  times of stress she has cut herself superficially on the leg , this is not a new issue is going for years but was unknown to me. Advise patient: See psychiatry ASAP,  Discuss situation w/  husband, she must not  have access to a gun. Crisis numbers provided. ER or call if he has suicidal thoughts. RTC 4 weeks.

## 2016-06-14 NOTE — Progress Notes (Signed)
Subjective:    Patient ID: Rebekah [name], female    DOB: August 17, 1961, 55 y.o.   MRN: [id number]  DOS:  06/14/2016 Type of visit - description : request a CPX  Interval history: Good medication compliance.   Review of Systems Constitutional: No fever. No chills. No unexplained wt changes. HRT was changed by gynecology, having night sweats  HEENT: No dental problems, no ear discharge, no facial swelling, no voice changes. No eye discharge, no eye  redness , no  intolerance to light   Respiratory: No wheezing , no  difficulty breathing. No cough , no mucus production  Cardiovascular: No CP, no leg swelling , no  Palpitations  GI: no nausea, no vomiting, no diarrhea , no  abdominal pain.  No blood in the stools. No dysphagia, no odynophagia    Endocrine: No polyphagia, no polyuria , no polydipsia  GU: No dysuria, gross hematuria, difficulty urinating. No urinary urgency, no frequency.  Musculoskeletal:  For the last year or so has noted more aches and pains, mostly at the elbows, knees and neck. No paresthesias Some myalgias Denies any hand or increased swelling or redness. Slightly stiff in the morning, resolves within 10 minutes. Has not change her routine, not exercising more. Skin: No change in the color of the skin, palor , no  Rash  Allergic, immunologic: No environmental allergies , no  food allergies  Neurological: No dizziness no  syncope. No headaches. No diplopia, no slurred, no slurred speech, no motor deficits, no facial  Numbness  Hematological: No enlarged lymph nodes, no easy bruising , no unusual bleedings  Psychiatry: No suicidal ideas, no hallucinations, no beavior problems, no confusion. Long history of depression: Controlled? See below Past Medical History:  Diagnosis Date  . Allergy   . Depression   . GERD (gastroesophageal reflux disease)   . Hyperlipidemia   . Hypertension   . Obesity    Lap band surgery 12-08 Dr Hassell Done  . OSA (obstructive sleep  apnea)    using a CPAP      Past Surgical History:  Procedure Laterality Date  . APPENDECTOMY    . BREAST REDUCTION SURGERY    . CHOLECYSTECTOMY    . CLOSED REDUCTION FINGER WITH PERCUTANEOUS PINNING Left 09/14/2014   Procedure: CLOSED REDUCTION PERCUTANEOUS PINNING ;  Surgeon: Leanora Cover, MD;  Location: [facility];  Service: Orthopedics;  Laterality: Left;  . Lap Band Adjustment  09/01/2015   Dr. Redmond Pulling  . LAPAROSCOPIC GASTRIC BANDING  09/2007   Lap band surgery 12-08 Dr Hassell Done  . NASAL SINUS SURGERY    . TONSILLECTOMY     x2    Social History   Social History  . Marital status: Married    Spouse name: N/A  . Number of children: 0  . Years of education: N/A   Occupational History  . [employer], works from home    Social History Main Topics  . Smoking status: Never Smoker  . Smokeless tobacco: Never Used  . Alcohol use Yes     Comment: socially   . Drug use: No  . Sexual activity: Not on file   Other Topics Concern  . Not on file   Social History Narrative   Lives w/ husband     Family History  Problem Relation Age of Onset  . Coronary artery disease Father     CABG, smoker .age onset 38s  . Prostate cancer Father   . Pulmonary fibrosis Father  and others  . Breast cancer Mother     age 18  . Heart disease Mother     CABG, smoker .age onset 30s  . Diverticulitis Mother   . Cancer Mother     Breast cancer  . Heart attack Brother     age 22  . Diabetes Neg Hx   . Colon cancer Neg Hx       Medication List       Accurate as of 06/14/16  2:00 PM. Always use your most recent med list.          aspirin 81 MG tablet Take 1 tablet (81 mg total) by mouth daily.   atorvastatin 10 MG tablet Commonly known as:  LIPITOR Take 1 tablet (10 mg total) by mouth daily.   DEPLIN 15 MG Tabs Take 0.5 tablets by mouth daily.   doxycycline 100 MG EC tablet Commonly known as:  DORYX Take 100 mg by mouth daily as needed.   FINACEA 15 %  Foam Generic drug:  Azelaic Acid Apply 1 application topically 2 (two) times daily.   JINTELI PO Take 1 mg by mouth daily.   lisinopril 10 MG tablet Commonly known as:  PRINIVIL,ZESTRIL Take 1 tablet (10 mg total) by mouth daily.   multivitamin tablet Take 1 tablet by mouth daily.   nortriptyline 75 MG capsule Commonly known as:  PAMELOR Take 1 capsule (75 mg total) by mouth daily.   omeprazole 40 MG capsule Commonly known as:  PRILOSEC Take 1 capsule (40 mg total) by mouth daily.   ranitidine 150 MG tablet Commonly known as:  ZANTAC Take 150 mg by mouth 2 (two) times daily.          Objective:   Physical Exam BP 118/76 (BP Location: Left Arm, Patient Position: Sitting, Cuff Size: Normal)   Pulse 86   Temp 98.7 F (37.1 C) (Oral)   Resp 14   Ht 5\' 4"  (1.626 m)   Wt 193 lb 8 oz (87.8 kg)   SpO2 97%   BMI 33.21 kg/m    General:   Well developed, well nourished . NAD.  Neck: No  thyromegaly  HEENT:  Normocephalic . Face symmetric, atraumatic Lungs:  CTA B Normal respiratory effort, no intercostal retractions, no accessory muscle use. Heart: RRR,  no murmur.  No pretibial edema bilaterally  Abdomen:  Not distended, soft, non-tender. No rebound or rigidity.   Skin: Exposed areas without rash. Not pale. Not jaundice MSK: hands-wrists w/o synovitis Neurologic:  alert & oriented X3.  Speech normal, gait appropriate for age and unassisted Strength symmetric and appropriate for age.  Psych: Cognition and judgment appear intact.  Cooperative with normal attention span and concentration.  Behavior appropriate. No anxious or depressed appearing.    Assessment & Plan:   Assessment > Prediabetes HTN Hyperlipidemia Anxiety, depression GERD Obesity, lap band surgery 2008 OSA CPAP Acne (doxy) ++FH CAD: F, M , B MI age 56, G-parents   PLAN: Prediabetes: On diet only, recheck a A1c. HTN: Seems controlled, continue lisinopril High cholesterol: On  Lipitor. Joint pains: As described above, will check a sedimentation rate, CK. Consider trial without Lipitor. Depression: Well controlled? Recently she talk to a health coach was recommended to see psychiatry. On further questions, states that she is feeling the best she has been feeling emotionally in the last few years yet from time to time when she gets upset suicidal ideas cross her mind. She has no plan or persistent thoughts, her  husband owns a gun, no previous attempts . Also during  times of stress she has cut herself superficially on the leg , this is not a new issue is going for years but was unknown to me. Advise patient: See psychiatry ASAP,  Discuss situation w/  husband, she must not  have access to a gun. Crisis numbers provided. ER or call if he has suicidal thoughts. RTC 4 weeks.

## 2016-06-14 NOTE — Patient Instructions (Signed)
GO TO THE LAB : Get the blood work     GO TO THE FRONT DESK Schedule your next appointment for a  checkup in 4 weeks    If need immediate help  Call our 24-hour HelpLine at [phone] or [place] 1-800-SUICIDE   The [employer] Suicide Prevention Lifeline 1-800-273-TALK

## 2016-06-14 NOTE — Assessment & Plan Note (Signed)
Td 6-14 ;Pneumonia shot 09-2015, Prevnar 05-2016  Female care per  Gynecology [facility]--08/2012--adenomatous polyp--Dr Stark--next 08/2017  Diet, exercise - counseled

## 2016-06-15 ENCOUNTER — Telehealth: Payer: Self-pay

## 2016-06-15 NOTE — Telephone Encounter (Signed)
Form completed and faxed to [facility] at [phone]. Original form mailed back to Pt and copy sent for scanning.

## 2016-06-27 ENCOUNTER — Telehealth: Payer: Self-pay | Admitting: Internal Medicine

## 2016-06-27 MED ORDER — L-METHYLFOLATE 15 MG PO TABS
7.5000 mg | ORAL_TABLET | Freq: Every day | ORAL | 1 refills | Status: DC
Start: 2016-06-27 — End: 2016-10-08

## 2016-06-27 NOTE — Telephone Encounter (Signed)
Rx sent 

## 2016-06-27 NOTE — Telephone Encounter (Signed)
Caller name: Hokulani Relationship to patient: self Can be reached:  Pharmacy: [facility], [address]  Reason for call: pt needs refill of L-methylfolate (deplin) sent to mail order. Has 1 week on hand. req 3 month supply

## 2016-07-16 ENCOUNTER — Ambulatory Visit (INDEPENDENT_AMBULATORY_CARE_PROVIDER_SITE_OTHER): Payer: Managed Care, Other (non HMO) | Admitting: Internal Medicine

## 2016-07-16 ENCOUNTER — Encounter: Payer: Self-pay | Admitting: Internal Medicine

## 2016-07-16 VITALS — BP 108/64 | HR 95 | Temp 98.5°F | Resp 14 | Ht 64.0 in | Wt 191.1 lb

## 2016-07-16 DIAGNOSIS — F419 Anxiety disorder, unspecified: Principal | ICD-10-CM

## 2016-07-16 DIAGNOSIS — R110 Nausea: Secondary | ICD-10-CM

## 2016-07-16 DIAGNOSIS — F418 Other specified anxiety disorders: Secondary | ICD-10-CM | POA: Diagnosis not present

## 2016-07-16 DIAGNOSIS — F329 Major depressive disorder, single episode, unspecified: Secondary | ICD-10-CM

## 2016-07-16 DIAGNOSIS — F32A Depression, unspecified: Secondary | ICD-10-CM

## 2016-07-16 MED ORDER — ONDANSETRON 8 MG PO TBDP: 8.0000 mg | ORAL_TABLET | Freq: Three times a day (TID) | ORAL | 0 refills | Status: DC | PRN

## 2016-07-16 NOTE — Progress Notes (Signed)
Subjective:    Patient ID: Rebekah [name], female    DOB: 1960-12-15, 55 y.o.   MRN: [id number]  DOS:  07/16/2016 Type of visit - description : Follow-up from previous visit Interval history: Depression: Doing about the same, suicidal ideas are not more frequent or intense Also, has developed GI symptoms for the last 24 hours: Nausea, few episode of vomiting, no hematemesis. Had some low-grade fever and left-sided abdominal pain. No sick people at work but her mother had GI symptoms mostly diarrhea.   Review of Systems  Appetite is somewhat decreased, she has been able to drink fluids Denies feeling orthostatic, weak or fainty No diarrhea, last bowel movement was yesterday before the onset of nausea and vomiting. No rash, no headache.  Past Medical History:  Diagnosis Date  . Allergy   . Depression   . GERD (gastroesophageal reflux disease)   . Hyperlipidemia   . Hypertension   . Obesity    Lap band surgery 12-08 Dr Hassell Done  . OSA (obstructive sleep apnea)    using a CPAP      Past Surgical History:  Procedure Laterality Date  . APPENDECTOMY    . BREAST REDUCTION SURGERY    . CHOLECYSTECTOMY    . CLOSED REDUCTION FINGER WITH PERCUTANEOUS PINNING Left 09/14/2014   Procedure: CLOSED REDUCTION PERCUTANEOUS PINNING ;  Surgeon: Leanora Cover, MD;  Location: [place] [facility];  Service: Orthopedics;  Laterality: Left;  . Lap Band Adjustment  09/01/2015   Dr. Redmond Pulling  . LAPAROSCOPIC GASTRIC BANDING  09/2007   Lap band surgery 12-08 Dr Hassell Done  . NASAL SINUS SURGERY    . TONSILLECTOMY     x2    Social History   Social History  . Marital status: Married    Spouse name: N/A  . Number of children: 0  . Years of education: N/A   Occupational History  . [employer], works from home    Social History Main Topics  . Smoking status: Never Smoker  . Smokeless tobacco: Never Used  . Alcohol use Yes     Comment: socially   . Drug use: No  . Sexual activity: Not on file    Other Topics Concern  . Not on file   Social History Narrative   Lives w/ husband        Medication List       Accurate as of 07/16/16  4:22 PM. Always use your most recent med list.          aspirin 81 MG tablet Take 1 tablet (81 mg total) by mouth daily.   atorvastatin 10 MG tablet Commonly known as:  LIPITOR Take 1 tablet (10 mg total) by mouth daily.   doxycycline 100 MG EC tablet Commonly known as:  DORYX Take 100 mg by mouth daily as needed.   FINACEA 15 % Foam Generic drug:  Azelaic Acid Apply 1 application topically 2 (two) times daily.   JINTELI PO Take 1 mg by mouth daily.   L-Methylfolate 15 MG Tabs Take 0.5 tablets (7.5 mg total) by mouth daily.   lisinopril 10 MG tablet Commonly known as:  PRINIVIL,ZESTRIL Take 1 tablet (10 mg total) by mouth daily.   multivitamin tablet Take 1 tablet by mouth daily.   nortriptyline 75 MG capsule Commonly known as:  PAMELOR Take 1 capsule (75 mg total) by mouth daily.   omeprazole 40 MG capsule Commonly known as:  PRILOSEC Take 1 capsule (40 mg total) by mouth daily.  ranitidine 150 MG tablet Commonly known as:  ZANTAC Take 150 mg by mouth 2 (two) times daily.          Objective:   Physical Exam BP 108/64 (BP Location: Right Arm, Patient Position: Sitting, Cuff Size: Normal)   Pulse 95   Temp 98.5 F (36.9 C) (Oral)   Resp 14   Ht 5\' 4"  (1.626 m)   Wt 191 lb 2 oz (86.7 kg)   SpO2 97%   BMI 32.81 kg/m  General:   Well developed, well nourished . NAD.  HEENT:  Normocephalic . Face symmetric, atraumatic Lungs:  CTA B Normal respiratory effort, no intercostal retractions, no accessory muscle use. Heart: RRR,  no murmur.  no pretibial edema bilaterally  Abdomen:  Not distended, soft, + bowel sounds, mild diffuse tenderness in all quadrants without rebound or rigidity.  Skin: Not pale. Not jaundice Neurologic:  alert & oriented X3.  Speech normal, gait appropriate for age and  unassisted Psych--  Cognition and judgment appear intact.  Cooperative with normal attention span and concentration.  Behavior appropriate. No anxious or depressed appearing.    Assessment & Plan:   Assessment > Prediabetes HTN Hyperlipidemia Anxiety, depression GERD Obesity, lap band surgery 2008 OSA CPAP Acne (doxy) ++FH CAD: F, M , B MI age 17, G-parents   PLAN: Depression: Since the last OV, she continue with the same meds, to see Dr. Casimiro Needle in 4 days. She is not better or worse. She declined to discuss her sx ( including gun safety)  with her husband, states she tried that before and she did not feel  he was listening . Plan: See psych  in 4 days. Joint aches: see last OV, CK was normal, declining trial without Lipitor Nausea vomiting: Sx started 24 hours ago, abdominal exam without SBO type of findings. Recommend conservative treatment, Zofran, call if no better. See instructions RTC 3 months.

## 2016-07-16 NOTE — Patient Instructions (Addendum)
Drink plenty of clear fluids  Takes Zofran as needed for nausea.  Call anytime if you don't feel you're able to keep yourself hydrated, you have high fever, severe symptoms, severe diarrhea, blood in the stools. Call if you are not back to normal in the next 3 or 4 days   While you are sick, avoid taking lisinopril.   Next visit in 3 months.

## 2016-07-16 NOTE — Progress Notes (Signed)
Pre visit review using our clinic review tool, if applicable. No additional management support is needed unless otherwise documented below in the visit note. 

## 2016-07-17 ENCOUNTER — Other Ambulatory Visit: Payer: Self-pay | Admitting: Internal Medicine

## 2016-07-17 NOTE — Assessment & Plan Note (Signed)
Depression: Since the last OV, she continue with the same meds, to see Dr. Casimiro Needle in 4 days. She is not better or worse. She declined to discuss her sx ( including gun safety)  with her husband, states she tried that before and she did not feel  he was listening . Plan: See psych  in 4 days. Joint aches: see last OV, CK was normal, declining trial without Lipitor Nausea vomiting: Sx started 24 hours ago, abdominal exam without SBO type of findings. Recommend conservative treatment, Zofran, call if no better. See instructions RTC 3 months.

## 2016-07-25 ENCOUNTER — Encounter: Payer: Self-pay | Admitting: Internal Medicine

## 2016-07-25 MED ORDER — ATORVASTATIN CALCIUM 10 MG PO TABS: 10.0000 mg | ORAL_TABLET | Freq: Every day | ORAL | 3 refills | Status: DC

## 2016-08-02 ENCOUNTER — Telehealth: Payer: Self-pay | Admitting: Internal Medicine

## 2016-08-02 NOTE — Telephone Encounter (Signed)
Caller name: Erline Levine from [facility]  Call back number: [phone] reference # # [id number]   Reason for call:  As per [facility]  Patient is requesting generic brand of atorvastatin (LIPITOR) 10 MG tablet. Requesting a new rx

## 2016-08-03 NOTE — Telephone Encounter (Signed)
Rx for generic brand Atorvastatin sent to pharmacy on 07/25/2016.

## 2016-08-31 ENCOUNTER — Other Ambulatory Visit: Payer: Self-pay | Admitting: Obstetrics and Gynecology

## 2016-08-31 DIAGNOSIS — R928 Other abnormal and inconclusive findings on diagnostic imaging of breast: Secondary | ICD-10-CM

## 2016-09-04 ENCOUNTER — Ambulatory Visit
Admission: RE | Admit: 2016-09-04 | Discharge: 2016-09-04 | Disposition: A | Discharge status: Home / Self Care | Payer: Managed Care, Other (non HMO) | Source: Ambulatory Visit | Attending: Obstetrics and Gynecology | Admitting: Obstetrics and Gynecology

## 2016-09-04 DIAGNOSIS — R928 Other abnormal and inconclusive findings on diagnostic imaging of breast: Secondary | ICD-10-CM

## 2016-09-28 ENCOUNTER — Other Ambulatory Visit: Payer: Self-pay | Admitting: Internal Medicine

## 2016-10-08 ENCOUNTER — Ambulatory Visit (INDEPENDENT_AMBULATORY_CARE_PROVIDER_SITE_OTHER): Payer: Managed Care, Other (non HMO) | Admitting: Internal Medicine

## 2016-10-08 ENCOUNTER — Encounter: Payer: Self-pay | Admitting: Internal Medicine

## 2016-10-08 VITALS — BP 122/68 | HR 67 | Temp 98.7°F | Resp 14 | Ht 64.0 in | Wt 192.2 lb

## 2016-10-08 DIAGNOSIS — F418 Other specified anxiety disorders: Secondary | ICD-10-CM

## 2016-10-08 DIAGNOSIS — F419 Anxiety disorder, unspecified: Principal | ICD-10-CM

## 2016-10-08 DIAGNOSIS — F32A Depression, unspecified: Secondary | ICD-10-CM

## 2016-10-08 DIAGNOSIS — F329 Major depressive disorder, single episode, unspecified: Secondary | ICD-10-CM

## 2016-10-08 NOTE — Progress Notes (Signed)
Pre visit review using our clinic review tool, if applicable. No additional management support is needed unless otherwise documented below in the visit note. 

## 2016-10-08 NOTE — Patient Instructions (Addendum)
  GO TO THE FRONT DESK Schedule your next appointment for a  Physical by 05-2017     Check the  blood pressure 2 or 3 times a month   Be sure your blood pressure is between 110/65 and  135/85. If it is consistently higher or lower, let me know    Hold lipitor for 4 weeks, then go back on it,let me know if it causes aches-pains

## 2016-10-08 NOTE — Progress Notes (Signed)
Subjective:    Patient ID: Rebekah [name], female    DOB: August 02, 1961, 55 y.o.   MRN: [id number]  DOS:  10/08/2016 Type of visit - description : Follow-up from previous visit Interval history: Since the last time she was here, saw psychiatry, he felt Many of her symptoms were due to menopause, psychiatry meds were not  changes, was recommended to see gynecology for HRT. She did, feeling slightly better. Aches and pains: About the same. Developed tennis elbow, had a local injection, doing better.   Review of Systems Denies suicidal ideas, no self inflicting cuts.  Past Medical History:  Diagnosis Date  . Allergy   . Depression   . GERD (gastroesophageal reflux disease)   . Hyperlipidemia   . Hypertension   . Obesity    Lap band surgery 12-08 Dr Hassell Done  . OSA (obstructive sleep apnea)    using a CPAP      Past Surgical History:  Procedure Laterality Date  . APPENDECTOMY    . BREAST REDUCTION SURGERY    . CHOLECYSTECTOMY    . CLOSED REDUCTION FINGER WITH PERCUTANEOUS PINNING Left 09/14/2014   Procedure: CLOSED REDUCTION PERCUTANEOUS PINNING ;  Surgeon: Leanora Cover, MD;  Location: [place];  Service: Orthopedics;  Laterality: Left;  . Lap Band Adjustment  09/01/2015   Dr. Redmond Pulling  . LAPAROSCOPIC GASTRIC BANDING  09/2007   Lap band surgery 12-08 Dr Hassell Done  . NASAL SINUS SURGERY    . TONSILLECTOMY     x2    Social History   Social History  . Marital status: Married    Spouse name: N/A  . Number of children: 0  . Years of education: N/A   Occupational History  . [employer], works from home    Social History Main Topics  . Smoking status: Never Smoker  . Smokeless tobacco: Never Used  . Alcohol use Yes     Comment: socially   . Drug use: No  . Sexual activity: Not on file   Other Topics Concern  . Not on file   Social History Narrative   Lives w/ husband      Allergies as of 10/08/2016      Reactions   Penicillins    REACTION: rash   Erythromycin    Gastrointestinal issues.      Medication List       Accurate as of 10/08/16 11:59 PM. Always use your most recent med list.          aspirin 81 MG tablet Take 1 tablet (81 mg total) by mouth daily.   atorvastatin 10 MG tablet Commonly known as:  LIPITOR Take 1 tablet (10 mg total) by mouth daily.   DEPLIN 7.5 7.5-90.314 MG Caps Take 1 tablet by mouth daily.   DIVIGEL 1 MG/GM Gel Generic drug:  Estradiol Take 1 tablet by mouth daily.   docusate sodium 100 MG capsule Commonly known as:  COLACE Take 100 mg by mouth daily as needed for mild constipation.   doxycycline 150 MG EC tablet Commonly known as:  DORYX Take 1 tablet by mouth daily.   FINACEA 15 % Foam Generic drug:  Azelaic Acid Apply 1 application topically 2 (two) times daily.   lisinopril 10 MG tablet Commonly known as:  PRINIVIL,ZESTRIL Take 1 tablet (10 mg total) by mouth daily.   multivitamin tablet Take 1 tablet by mouth daily.   nortriptyline 75 MG capsule Commonly known as:  PAMELOR Take 1 capsule (75 mg  total) by mouth daily.   omeprazole 40 MG capsule Commonly known as:  PRILOSEC Take 1 capsule (40 mg total) by mouth daily.   ondansetron 8 MG disintegrating tablet Commonly known as:  ZOFRAN-ODT Take 1 tablet (8 mg total) by mouth every 8 (eight) hours as needed for nausea or vomiting.   PENNSAID 2 % Soln Generic drug:  Diclofenac Sodium Take 1 tablet by mouth as needed.   progesterone 100 MG capsule Commonly known as:  PROMETRIUM Take 100 mg by mouth daily.   ranitidine 150 MG tablet Commonly known as:  ZANTAC Take 150 mg by mouth 2 (two) times daily.          Objective:   Physical Exam BP 122/68 (BP Location: Left Arm, Patient Position: Sitting, Cuff Size: Normal)   Pulse 67   Temp 98.7 F (37.1 C) (Oral)   Resp 14   Ht 5\' 4"  (1.626 m)   Wt 192 lb 4 oz (87.2 kg)   SpO2 98%   BMI 33.00 kg/m  General:   Well developed, well nourished . NAD.  HEENT:    Normocephalic . Face symmetric, atraumatic Lungs:  CTA B Normal respiratory effort, no intercostal retractions, no accessory muscle use. Heart: RRR,  no murmur.  No pretibial edema bilaterally  Skin: Not pale. Not jaundice Neurologic:  alert & oriented X3.  Speech normal, gait appropriate for age and unassisted Psych--  Cognition and judgment appear intact.  Cooperative with normal attention span and concentration.  Behavior appropriate. No anxious or depressed appearing.      Assessment & Plan:   Assessment  Prediabetes HTN Hyperlipidemia Anxiety, depression (chronic sx, occ suicidal ideas, h/o self cutting) GERD Obesity, lap band surgery 2008 OSA CPAP Acne (doxy) ++FH CAD: F, M , B MI age 86, G-parents   PLAN: Depression: [facility], no med changes recommended, they advised her to see gynecology for HRT. She did, feels slightly better. Joint aches: About the same, at this point she agreed to hold Lipitor, see instructions. Nausea vomiting: No further symptoms RTC 05-2018, CPX

## 2016-10-09 NOTE — Assessment & Plan Note (Addendum)
Depression: [facility] psychiatry, no med changes recommended, they advised her to see gynecology for HRT. She did, feels slightly better. Joint aches: About the same, at this point she agreed to hold Lipitor, see instructions. Nausea vomiting: No further symptoms RTC 05-2018, CPX

## 2016-11-15 ENCOUNTER — Encounter: Payer: Self-pay | Admitting: Internal Medicine

## 2016-12-11 ENCOUNTER — Encounter: Payer: Self-pay | Admitting: Internal Medicine

## 2016-12-11 DIAGNOSIS — E785 Hyperlipidemia, unspecified: Secondary | ICD-10-CM

## 2017-01-24 ENCOUNTER — Other Ambulatory Visit: Payer: Self-pay | Admitting: Internal Medicine

## 2017-01-28 DIAGNOSIS — S82832A Other fracture of upper and lower end of left fibula, initial encounter for closed fracture: Secondary | ICD-10-CM | POA: Diagnosis not present

## 2017-01-28 DIAGNOSIS — Y999 Unspecified external cause status: Secondary | ICD-10-CM | POA: Diagnosis not present

## 2017-01-28 DIAGNOSIS — I10 Essential (primary) hypertension: Secondary | ICD-10-CM | POA: Insufficient documentation

## 2017-01-28 DIAGNOSIS — Z7982 Long term (current) use of aspirin: Secondary | ICD-10-CM | POA: Insufficient documentation

## 2017-01-28 DIAGNOSIS — S99912A Unspecified injury of left ankle, initial encounter: Secondary | ICD-10-CM | POA: Diagnosis present

## 2017-01-28 DIAGNOSIS — Y929 Unspecified place or not applicable: Secondary | ICD-10-CM | POA: Diagnosis not present

## 2017-01-28 DIAGNOSIS — Z79899 Other long term (current) drug therapy: Secondary | ICD-10-CM | POA: Insufficient documentation

## 2017-01-28 DIAGNOSIS — W010XXA Fall on same level from slipping, tripping and stumbling without subsequent striking against object, initial encounter: Secondary | ICD-10-CM | POA: Diagnosis not present

## 2017-01-28 DIAGNOSIS — Y939 Activity, unspecified: Secondary | ICD-10-CM | POA: Diagnosis not present

## 2017-01-29 ENCOUNTER — Emergency Department (HOSPITAL_COMMUNITY)
Admission: EM | Admit: 2017-01-29 | Discharge: 2017-01-29 | Disposition: A | Discharge status: Home / Self Care | Payer: 59 | Attending: Emergency Medicine | Admitting: Emergency Medicine

## 2017-01-29 ENCOUNTER — Emergency Department (HOSPITAL_COMMUNITY): Payer: 59

## 2017-01-29 ENCOUNTER — Encounter (HOSPITAL_COMMUNITY): Payer: Self-pay | Admitting: Emergency Medicine

## 2017-01-29 DIAGNOSIS — W19XXXA Unspecified fall, initial encounter: Secondary | ICD-10-CM

## 2017-01-29 DIAGNOSIS — S82832A Other fracture of upper and lower end of left fibula, initial encounter for closed fracture: Secondary | ICD-10-CM

## 2017-01-29 DIAGNOSIS — R52 Pain, unspecified: Secondary | ICD-10-CM

## 2017-01-29 MED ORDER — IBUPROFEN 400 MG PO TABS
600.0000 mg | ORAL_TABLET | Freq: Once | ORAL | Status: AC
  Administered 2017-01-29: 600 mg via ORAL
  Filled 2017-01-29: qty 1

## 2017-01-29 NOTE — ED Provider Notes (Signed)
Rebekah [name] Provider Note   CSN: [id number] Arrival date & time: 01/28/17  2355  By signing my name below, I, Rebekah [name], attest that this documentation has been prepared under the direction and in the presence of Jola Schmidt, MD.  Electronically Signed: Julien Nordmann, ED Scribe. 01/29/17. 1:43 AM.    History   Chief Complaint Chief Complaint  Patient presents with  . Ankle Pain   The history is provided by the patient. No language interpreter was used.   HPI Comments: Rebekah [name] is a 56 y.o. female who has a PMhx of GERD, HLD, HTN, and OSA presents to the Emergency Department complaining of acute onset, moderate, left ankle pain s/p a fall that occurred several hours ago. pt has associated swelling to the area. Pt states that her dog "clipped" her from behind, causing her to trip and fall, landing directly on her left ankle. Pt has not taken any medication to alleviate her pain. She has no other complaints.  Past Medical History:  Diagnosis Date  . Allergy   . Depression   . GERD (gastroesophageal reflux disease)   . Hyperlipidemia   . Hypertension   . Obesity    Lap band surgery 12-08 Dr Hassell Done  . OSA (obstructive sleep apnea)    using a CPAP      Patient Active Problem List   Diagnosis Date Noted  . PCP NOTES >>>> 09/25/2015  . Allergic rhinitis 08/11/2014  . Annual physical exam 09/05/2011  . GERD 10/12/2009  . ENDOGENOUS OBESITY 09/22/2007  . HYPERLIPIDEMIA 09/22/2007  . Anxiety and depression 09/22/2007  . HYPERTENSION 09/22/2007  . Seasonal and perennial allergic rhinitis 09/22/2007  . Obstructive sleep apnea 09/22/2007  . REDUCTION MAMMOPLASTY, HX OF 09/22/2007    Past Surgical History:  Procedure Laterality Date  . APPENDECTOMY    . BREAST REDUCTION SURGERY    . CHOLECYSTECTOMY    . CLOSED REDUCTION FINGER WITH PERCUTANEOUS PINNING Left 09/14/2014   Procedure: CLOSED REDUCTION PERCUTANEOUS PINNING ;  Surgeon: Leanora Cover, MD;  Location:  [place];  Service: Orthopedics;  Laterality: Left;  . Lap Band Adjustment  09/01/2015   Dr. Redmond Pulling  . LAPAROSCOPIC GASTRIC BANDING  09/2007   Lap band surgery 12-08 Dr Hassell Done  . NASAL SINUS SURGERY    . TONSILLECTOMY     x2    OB History    No data available       Home Medications    Prior to Admission medications   Medication Sig Start Date End Date Taking? Authorizing Provider  aspirin 81 MG tablet Take 1 tablet (81 mg total) by mouth daily. 09/04/13   Colon Branch, MD  Azelaic Acid (FINACEA) 15 % FOAM Apply 1 application topically 2 (two) times daily. 05/18/16   Historical Provider, MD  DIVIGEL 1 MG/GM GEL Take 1 tablet by mouth daily. 08/30/16   Historical Provider, MD  docusate sodium (COLACE) 100 MG capsule Take 100 mg by mouth daily as needed for mild constipation.    Historical Provider, MD  doxycycline (DORYX) 150 MG EC tablet Take 1 tablet by mouth daily. 10/04/16   Historical Provider, MD  L-Methylfolate-Algae (DEPLIN 7.5) 7.5-90.314 MG CAPS Take 1 tablet by mouth daily.    Historical Provider, MD  lisinopril (PRINIVIL,ZESTRIL) 10 MG tablet Take 1 tablet (10 mg total) by mouth daily. 09/28/16   Colon Branch, MD  Multiple Vitamin (MULTIVITAMIN) tablet Take 1 tablet by mouth daily. 09/04/13   Colon Branch,  MD  nortriptyline (PAMELOR) 75 MG capsule Take 1 capsule (75 mg total) by mouth daily. 09/27/15   Colon Branch, MD  omeprazole (PRILOSEC) 40 MG capsule Take 1 capsule (40 mg total) by mouth daily. 09/28/16   Colon Branch, MD  ondansetron (ZOFRAN-ODT) 8 MG disintegrating tablet Take 1 tablet (8 mg total) by mouth every 8 (eight) hours as needed for nausea or vomiting. Patient not taking: Reported on 10/08/2016 07/16/16   Colon Branch, MD  PENNSAID 2 % SOLN Take 1 tablet by mouth as needed. 10/03/16   Historical Provider, MD  progesterone (PROMETRIUM) 100 MG capsule Take 100 mg by mouth daily. 08/30/16   Historical Provider, MD  ranitidine (ZANTAC) 150 MG tablet Take 150 mg  by mouth 2 (two) times daily.    Historical Provider, MD    Family History Family History  Problem Relation Age of Onset  . Coronary artery disease Father     CABG, smoker .age onset 99s  . Prostate cancer Father   . Pulmonary fibrosis Father     and others  . Breast cancer Mother     age 85  . Heart disease Mother     CABG, smoker .age onset 6s  . Diverticulitis Mother   . Cancer Mother     Breast cancer  . Heart attack Brother     age 15  . Diabetes Neg Hx   . Colon cancer Neg Hx     Social History Social History  Substance Use Topics  . Smoking status: Never Smoker  . Smokeless tobacco: Never Used  . Alcohol use Yes     Comment: socially      Allergies   Penicillins and Erythromycin   Review of Systems Review of Systems  A complete 10 system review of systems was obtained and all systems are negative except as noted in the HPI and PMH.   Physical Exam Updated Vital Signs BP 130/85   Pulse 79   Temp 98.5 F (36.9 C) (Oral)   Resp 15   Ht 5\' 4"  (1.626 m)   Wt 195 lb (88.5 kg)   SpO2 97%   BMI 33.47 kg/m   Physical Exam  Constitutional: She is oriented to person, place, and time. She appears well-developed and well-nourished.  HENT:  Head: Normocephalic.  Eyes: EOM are normal.  Neck: Normal range of motion.  Pulmonary/Chest: Effort normal.  Abdominal: She exhibits no distension.  Musculoskeletal: She exhibits edema and tenderness.  Tenderness and swelling to the left lateral malleolus without obvious deformity  Neurological: She is alert and oriented to person, place, and time.  Psychiatric: She has a normal mood and affect.  Nursing note and vitals reviewed.    ED Treatments / Results  DIAGNOSTIC STUDIES: Oxygen Saturation is 98% on RA, normal by my interpretation.  COORDINATION OF CARE:  1:40 AM Discussed treatment plan with pt at bedside and pt agreed to plan.  Labs (all labs ordered are listed, but only abnormal results are  displayed) Labs Reviewed - No data to display  EKG  EKG Interpretation None       Radiology Dg Ankle Complete Left  Result Date: 01/29/2017 CLINICAL DATA:  Left ankle pain after tripping and landing on left ankle. EXAM: LEFT ANKLE COMPLETE - 3+ VIEW COMPARISON:  None. FINDINGS: Soft tissue swelling over the malleoli. Ankle mortise appears intact. On the oblique view of the left ankle is subtle contour irregularity of the distal diaphysis of the fibula suspicious  for an incomplete fracture. Base of fifth metatarsal appears intact. Calcaneal enthesophytes are noted. IMPRESSION: Subtle bowed appearance of the distal diaphysis of the fibula suspicious for an incomplete fracture. Intact ankle mortise. Soft tissue swelling about the malleoli. Calcaneal enthesophytes along the plantar dorsal aspect. Electronically Signed   By: Ashley Royalty M.D.   On: 01/29/2017 02:19   Dg Foot Complete Left  Result Date: 01/29/2017 CLINICAL DATA:  Tripping injury with foot pain and swelling. EXAM: LEFT FOOT - COMPLETE 3+ VIEW COMPARISON:  None. FINDINGS: There is no evidence of fracture or dislocation. There is no evidence of arthropathy or other focal bone abnormality. Lateral soft tissue swelling along the dorsal and lateral aspect of the forefoot. Lisfranc articulation appears congruent. Calcaneal enthesophytes are present. IMPRESSION: No acute fracture or malalignment of the foot is identified. There is mild dorsal and lateral soft tissue swelling of the foot. Electronically Signed   By: Ashley Royalty M.D.   On: 01/29/2017 02:21    Procedures Procedures (including critical care time)  Medications Ordered in ED Medications - No data to display   Initial Impression / Assessment and Plan / ED Course  I have reviewed the triage vital signs and the nursing notes.  Pertinent labs & imaging results that were available during my care of the patient were reviewed by me and considered in my medical decision making (see  chart for details).     Bowing of the distal left fibula concern for possible incomplete fibular fracture.  Given location this would classify as a Weber C fibula fracture and therefore will be placed in a nonweightbearing status.  Orthopedic follow-up.  Patient understands return to the emergency department for new or worsening symptoms.  Episode in triage appears to been a vagal episode.  She is completely asymptomatic at this time.  She states that this happens at times.  Final Clinical Impressions(s) / ED Diagnoses   Final diagnoses:  None   I personally performed the services described in this documentation, which was scribed in my presence. The recorded information has been reviewed and is accurate.     New Prescriptions New Prescriptions   No medications on file     Jola Schmidt, MD 01/29/17 [phone]

## 2017-01-29 NOTE — ED Triage Notes (Signed)
Pt presents to ED for assessment of left ankle pain after tripping over and landing on her left ankle.  Swelling noted, pain worsening.  Pt also hypotensive at triage, nausea, pale, diaphoretic.

## 2017-02-26 ENCOUNTER — Encounter: Payer: Self-pay | Admitting: Internal Medicine

## 2017-02-26 DIAGNOSIS — E785 Hyperlipidemia, unspecified: Secondary | ICD-10-CM

## 2017-03-05 LAB — LIPID PANEL
Cholesterol: 191 mg/dL (ref 0–200)
HDL: 48 mg/dL (ref 35–70)
LDL Cholesterol: 120 mg/dL
LDL/HDL RATIO: 4
Triglycerides: 120 mg/dL (ref 40–160)

## 2017-03-05 LAB — BASIC METABOLIC PANEL: Glucose: 76 mg/dL

## 2017-03-19 ENCOUNTER — Encounter: Payer: Self-pay | Admitting: Internal Medicine

## 2017-03-19 DIAGNOSIS — E785 Hyperlipidemia, unspecified: Secondary | ICD-10-CM

## 2017-03-21 MED ORDER — ROSUVASTATIN CALCIUM 10 MG PO TABS: 10.0000 mg | ORAL_TABLET | Freq: Every day | ORAL | 2 refills | Status: DC

## 2017-03-21 NOTE — Telephone Encounter (Signed)
Per PCP: in 09/2016 Pt was instructed to hold Lipitor, results received from 03/05/2017, plan: start Crestor 10mg  1 tab po qhs # 30 and 2 RF and labs in 6 weeks: FLP, AST, ALT.

## 2017-04-15 ENCOUNTER — Encounter: Payer: Self-pay | Admitting: Internal Medicine

## 2017-04-15 MED ORDER — ROSUVASTATIN CALCIUM 10 MG PO TABS: 10.0000 mg | ORAL_TABLET | Freq: Every day | ORAL | 0 refills | Status: DC

## 2017-04-22 ENCOUNTER — Other Ambulatory Visit (INDEPENDENT_AMBULATORY_CARE_PROVIDER_SITE_OTHER): Payer: 59

## 2017-04-22 DIAGNOSIS — E785 Hyperlipidemia, unspecified: Secondary | ICD-10-CM

## 2017-04-22 LAB — AST: AST: 20 U/L (ref 0–37)

## 2017-04-22 LAB — LIPID PANEL
CHOL/HDL RATIO: 3
CHOLESTEROL: 137 mg/dL (ref 0–200)
HDL: 48.6 mg/dL (ref >39.00)
LDL CALC: 71 mg/dL (ref 0–99)
NonHDL: 88.57
TRIGLYCERIDES: 86 mg/dL (ref 0.0–149.0)
VLDL: 17.2 mg/dL (ref 0.0–40.0)

## 2017-04-22 LAB — ALT: ALT: 22 U/L (ref 0–35)

## 2017-06-05 ENCOUNTER — Ambulatory Visit (INDEPENDENT_AMBULATORY_CARE_PROVIDER_SITE_OTHER): Payer: 59 | Admitting: Internal Medicine

## 2017-06-05 ENCOUNTER — Encounter: Payer: Self-pay | Admitting: Internal Medicine

## 2017-06-05 ENCOUNTER — Telehealth: Payer: Self-pay | Admitting: Gastroenterology

## 2017-06-05 VITALS — BP 124/78 | HR 79 | Temp 97.9°F | Resp 14 | Ht 64.0 in | Wt 199.5 lb

## 2017-06-05 DIAGNOSIS — Z0000 Encounter for general adult medical examination without abnormal findings: Secondary | ICD-10-CM | POA: Diagnosis not present

## 2017-06-05 DIAGNOSIS — G4733 Obstructive sleep apnea (adult) (pediatric): Secondary | ICD-10-CM

## 2017-06-05 LAB — CBC WITH DIFFERENTIAL/PLATELET
BASOS PCT: 0.9 % (ref 0.0–3.0)
Basophils Absolute: 0.1 10*3/uL (ref 0.0–0.1)
EOS PCT: 2.4 % (ref 0.0–5.0)
Eosinophils Absolute: 0.2 10*3/uL (ref 0.0–0.7)
HEMATOCRIT: 37.9 % (ref 36.0–46.0)
HEMOGLOBIN: 12.1 g/dL (ref 12.0–15.0)
LYMPHS PCT: 34.4 % (ref 12.0–46.0)
Lymphs Abs: 2.9 10*3/uL (ref 0.7–4.0)
MCHC: 31.8 g/dL (ref 30.0–36.0)
MCV: 86.4 fl (ref 78.0–100.0)
Monocytes Absolute: 0.5 10*3/uL (ref 0.1–1.0)
Monocytes Relative: 5.7 % (ref 3.0–12.0)
Neutro Abs: 4.7 10*3/uL (ref 1.4–7.7)
Neutrophils Relative %: 56.6 % (ref 43.0–77.0)
Platelets: 341 10*3/uL (ref 150.0–400.0)
RBC: 4.38 Mil/uL (ref 3.87–5.11)
RDW: 15.5 % (ref 11.5–15.5)
WBC: 8.3 10*3/uL (ref 4.0–10.5)

## 2017-06-05 LAB — COMPREHENSIVE METABOLIC PANEL
ALBUMIN: 4 g/dL (ref 3.5–5.2)
ALT: 22 U/L (ref 0–35)
AST: 21 U/L (ref 0–37)
Alkaline Phosphatase: 70 U/L (ref 39–117)
BUN: 9 mg/dL (ref 6–23)
CALCIUM: 9 mg/dL (ref 8.4–10.5)
CHLORIDE: 106 meq/L (ref 96–112)
CO2: 28 meq/L (ref 19–32)
Creatinine, Ser: 0.79 mg/dL (ref 0.40–1.20)
GFR: 80.37 mL/min (ref >60.00)
Glucose, Bld: 88 mg/dL (ref 70–99)
Potassium: 4.2 mEq/L (ref 3.5–5.1)
SODIUM: 139 meq/L (ref 135–145)
Total Bilirubin: 0.3 mg/dL (ref 0.2–1.2)
Total Protein: 6.7 g/dL (ref 6.0–8.3)

## 2017-06-05 LAB — HEMOGLOBIN A1C: Hgb A1c MFr Bld: 6.1 % (ref 4.6–6.5)

## 2017-06-05 LAB — TSH: TSH: 3.02 u[IU]/mL (ref 0.35–4.50)

## 2017-06-05 LAB — FOLATE: Folate: >24 ng/mL (ref >5.9)

## 2017-06-05 LAB — VITAMIN B12: Vitamin B-12: 429 pg/mL (ref 211–911)

## 2017-06-05 NOTE — Assessment & Plan Note (Addendum)
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 H/o  Bariatric surgery, will check I71, folic acid and vitamin D

## 2017-06-05 NOTE — Telephone Encounter (Signed)
The pt was scheduled for office visit to discuss EGD added to colon in November.  She has had worse reflux while on omeprazole 40 mg daily and zantac 150 mg qhs.

## 2017-06-05 NOTE — Progress Notes (Signed)
Subjective:    Patient ID: Rebekah [name], female    DOB: [birth year], 56 y.o.   MRN: [id number]  DOS:  06/05/2017 Type of visit - description : cpx Interval history: Good med compliance, nl amb BPs  Wt Readings from Last 3 Encounters:  06/05/17 199 lb 8 oz (90.5 kg)  01/29/17 195 lb (88.5 kg)  10/08/16 192 lb 4 oz (87.2 kg)    Review of Systems occ dizziness, on-off x a while, random sx, at rest/while walking, last ~ 15 seconds, "need to hold-on to something", no asssoc Diplopia, headache, numbness. No nausea. No visual disturbances. Has not been using CPAP for a while.  Other than above, a 14 point review of systems is negative     Past Medical History:  Diagnosis Date  . Allergy   . Depression   . GERD (gastroesophageal reflux disease)   . Hyperlipidemia   . Hypertension   . Obesity    Lap band surgery 12-08 Dr Hassell Done  . OSA (obstructive sleep apnea)    using a CPAP      Past Surgical History:  Procedure Laterality Date  . APPENDECTOMY    . BREAST REDUCTION SURGERY    . CHOLECYSTECTOMY    . CLOSED REDUCTION FINGER WITH PERCUTANEOUS PINNING Left 09/14/2014   Procedure: CLOSED REDUCTION PERCUTANEOUS PINNING ;  Surgeon: Leanora Cover, MD;  Location: [facility];  Service: Orthopedics;  Laterality: Left;  . Lap Band Adjustment  09/01/2015   Dr. Redmond Pulling  . LAPAROSCOPIC GASTRIC BANDING  09/2007   Lap band surgery 12-08 Dr Hassell Done  . NASAL SINUS SURGERY    . TONSILLECTOMY     x2    Social History   Social History  . Marital status: Married    Spouse name: N/A  . Number of children: 0  . Years of education: N/A   Occupational History  . [employer], works from home    Social History Main Topics  . Smoking status: Never Smoker  . Smokeless tobacco: Never Used  . Alcohol use Yes     Comment: socially   . Drug use: No  . Sexual activity: Not on file   Other Topics Concern  . Not on file   Social History Narrative   Lives w/ husband     Family  History  Problem Relation Age of Onset  . Coronary artery disease Father        CABG, smoker .age onset 77s  . Prostate cancer Father   . Pulmonary fibrosis Father        and others  . Breast cancer Mother        age 41  . Heart disease Mother        CABG, smoker .age onset 22s  . Diverticulitis Mother   . Heart attack Brother        age 25  . Diabetes Neg Hx   . Colon cancer Neg Hx      Allergies as of 06/05/2017      Reactions   Penicillins    REACTION: rash   Erythromycin    Gastrointestinal issues.      Medication List       Accurate as of 06/05/17  9:10 PM. Always use your most recent med list.          aspirin 81 MG tablet Take 1 tablet (81 mg total) by mouth daily.   DEPLIN 7.5 7.5-90.314 MG Caps Take 1 capsule by mouth daily.  DIVIGEL 1 MG/GM Gel Generic drug:  Estradiol Take 1 mg by mouth daily.   FINACEA 15 % Foam Generic drug:  Azelaic Acid Apply 1 application topically 2 (two) times daily.   FISH OIL PO Take 2 tablets by mouth daily. chewables   lisinopril 10 MG tablet Commonly known as:  PRINIVIL,ZESTRIL Take 1 tablet (10 mg total) by mouth daily.   multivitamin tablet Take 1 tablet by mouth daily.   nortriptyline 75 MG capsule Commonly known as:  PAMELOR Take 1 capsule (75 mg total) by mouth daily.   omeprazole 40 MG capsule Commonly known as:  PRILOSEC Take 1 capsule (40 mg total) by mouth daily.   PROBIOTIC-10 Chew Chew by mouth.   progesterone 100 MG capsule Commonly known as:  PROMETRIUM Take 100 mg by mouth daily.   ranitidine 150 MG tablet Commonly known as:  ZANTAC Take 150 mg by mouth 2 (two) times daily.   rosuvastatin 10 MG tablet Commonly known as:  CRESTOR Take 1 tablet (10 mg total) by mouth at bedtime.          Objective:   Physical Exam BP 124/78 (BP Location: Left Arm, Patient Position: Sitting, Cuff Size: Normal)   Pulse 79   Temp 97.9 F (36.6 C) (Oral)   Resp 14   Ht 5\' 4"  (1.626 m)   Wt 199  lb 8 oz (90.5 kg)   SpO2 97%   BMI 34.24 kg/m   General:   Well developed, slightly overweight appearing . NAD.  Neck: No  thyromegaly  HEENT:  Normocephalic . Face symmetric, atraumatic Lungs:  CTA B Normal respiratory effort, no intercostal retractions, no accessory muscle use. Heart: RRR,  no murmur.  No pretibial edema bilaterally  Abdomen:  Not distended, soft, non-tender. No rebound or rigidity.  Objective device felt on the right of the abdomen. Skin: Exposed areas without rash. Not pale. Not jaundice Neurologic:  alert & oriented X3.  Speech normal, gait appropriate for age and unassisted Strength symmetric and appropriate for age.  Psych: Cognition and judgment appear intact.  Cooperative with normal attention span and concentration.  Behavior appropriate. No anxious or depressed appearing.    Assessment & Plan:    Assessment  Prediabetes HTN Hyperlipidemia: lipitor aches (2018) Anxiety, depression (chronic sx, occ suicidal ideas, h/o self cutting). Dr Casimiro Needle GERD Obesity, lap band surgery 2008 OSA: not using CPAP as off 05-2017 Acne (doxy) ++FH CAD: F, M , B MI age 55, G-parents   PLAN: Prediabetes: Diet control, check A1c. HTN: Continue same medications, ambulatory BPs okay, check labs Hyperlipidemia: Tolerating Crestor well, excellent results Anxiety depression: Under the care of psychiatrist, currently doing well OSA: Not on a CPAP x ~ 4 years, c/o fatigue. Will refer to neurology, hopefully will restart CPAP. Risk of untreated OSA discussed. Fatigue: See above, check labs. Dizziness: As described above, we agree on "wait-and-see". If symptoms increase she will let me know. RTC 4-6 months

## 2017-06-05 NOTE — Progress Notes (Signed)
Pre visit review using our clinic review tool, if applicable. No additional management support is needed unless otherwise documented below in the visit note. 

## 2017-06-05 NOTE — Patient Instructions (Signed)
GO TO THE LAB : Get the blood work     GO TO THE FRONT DESK Schedule your next appointment for a  Check up in 6-8 months  

## 2017-06-05 NOTE — Assessment & Plan Note (Signed)
Prediabetes: Diet control, check A1c. HTN: Continue same medications, ambulatory BPs okay, check labs Hyperlipidemia: Tolerating Crestor well, excellent results Anxiety depression: Under the care of psychiatrist, currently doing well OSA: Not on a CPAP x ~ 4 years, c/o fatigue. Will refer to neurology, hopefully will restart CPAP. Risk of untreated OSA discussed. Fatigue: See above, check labs. Dizziness: As described above, we agree on "wait-and-see". If symptoms increase she will let me know. RTC 4-6 months

## 2017-06-08 LAB — VITAMIN D 1,25 DIHYDROXY
Vitamin D 1, 25 (OH)2 Total: 54 pg/mL (ref 18–72)
Vitamin D3 1, 25 (OH)2: 54 pg/mL

## 2017-07-05 ENCOUNTER — Other Ambulatory Visit: Payer: Self-pay | Admitting: Internal Medicine

## 2017-07-08 ENCOUNTER — Encounter: Payer: Self-pay | Admitting: Neurology

## 2017-07-08 ENCOUNTER — Ambulatory Visit (INDEPENDENT_AMBULATORY_CARE_PROVIDER_SITE_OTHER): Payer: 59 | Admitting: Neurology

## 2017-07-08 VITALS — BP 129/78 | HR 93 | Ht 64.0 in | Wt 197.0 lb

## 2017-07-08 DIAGNOSIS — E669 Obesity, unspecified: Secondary | ICD-10-CM | POA: Diagnosis not present

## 2017-07-08 DIAGNOSIS — G2581 Restless legs syndrome: Secondary | ICD-10-CM

## 2017-07-08 DIAGNOSIS — K219 Gastro-esophageal reflux disease without esophagitis: Secondary | ICD-10-CM | POA: Diagnosis not present

## 2017-07-08 DIAGNOSIS — G4719 Other hypersomnia: Secondary | ICD-10-CM

## 2017-07-08 DIAGNOSIS — Z9884 Bariatric surgery status: Secondary | ICD-10-CM

## 2017-07-08 DIAGNOSIS — G4733 Obstructive sleep apnea (adult) (pediatric): Secondary | ICD-10-CM

## 2017-07-08 NOTE — Patient Instructions (Signed)

## 2017-07-08 NOTE — Progress Notes (Signed)
Subjective:    Patient ID: Rebekah [name] is a 56 y.o. female.  HPI     Star Age, MD, PhD [facility] [address] [place], [place] [address]  Dear Dr. Larose Kells,   I saw your patient, Rebekah [name], upon your kind request, in my neurologic clinic today for initial consultation of her sleep disorder, in particular, reevaluation of her prior diagnosis of OSA. The patient is unaccompanied today. As you know, Rebekah [name] is a 56 year old right-handed woman with an underlying medical history of hypertension, hyperlipidemia, allergies, obesity with status post weight loss surgery nearly 10 years ago, reflux disease and depression, who was previously diagnosed with obstructive sleep apnea several years ago and placed on CPAP. A sleep study report was reviewed from 05/13/2005, interpreted by Dr. Baird Lyons at the time. Sleep efficiency was 88%, sleep latency 46 minutes, REM latency 270 minutes, average oxygen saturation 96%, nadir was 77%. EKG showed occasional PVCs. Total AHI was 12.9 per hour. I reviewed your office note from 06/05/2017. She reports that she came back for a CPAP titration. Study results from her titration study are not available for my review today. She had a CPAP machine but has not used it in years. She reports that after she lost weight after her lap band surgery in 2008 she did not feel the need to keep using her CPAP machine. She lost about 70 pounds but gained about 40 back. Since her weight gain in the past years she has started snoring again and does not feel rested. She has intermittent restless leg symptoms. She is not aware of any family history of OSA. Her father has interstitial lung disease is understand. It may be a hereditary form.  Her Epworth sleepiness score is 10 out of 24 today, her fatigue score is 53 out of 63. She lives with her husband, she has no children. She works for an [employer] is a nonsmoker, drinks alcohol  infrequently, about once a month and caffeine in the form of soda, about 3 cups per day, typically no coffee. She has 2 dogs and 4 cats. One dog sleeps at the foot and. She suffers from reflux disease and has to sleep fairly upright. She also takes a PPI and Pepcid. Bedtime is around 9:30 and wake up time around 6:30. She had a tonsillectomy at age 73 months and then again at 56 years old. She had nasal surgery including polypectomy is understand and septum surgery some 25 years ago.  Her Past Medical History Is Significant For: Past Medical History:  Diagnosis Date  . Allergy   . Depression   . GERD (gastroesophageal reflux disease)   . Hyperlipidemia   . Hypertension   . Obesity    Lap band surgery 12-08 Dr Hassell Done  . OSA (obstructive sleep apnea)    using a CPAP      Her Past Surgical History Is Significant For: Past Surgical History:  Procedure Laterality Date  . APPENDECTOMY    . BREAST REDUCTION SURGERY    . CHOLECYSTECTOMY    . CLOSED REDUCTION FINGER WITH PERCUTANEOUS PINNING Left 09/14/2014   Procedure: CLOSED REDUCTION PERCUTANEOUS PINNING ;  Surgeon: Leanora Cover, MD;  Location: [facility];  Service: Orthopedics;  Laterality: Left;  . Lap Band Adjustment  09/01/2015   Dr. Redmond Pulling  . LAPAROSCOPIC GASTRIC BANDING  09/2007   Lap band surgery 12-08 Dr Hassell Done  . NASAL SINUS SURGERY    . TONSILLECTOMY  x2    Her Family History Is Significant For: Family History  Problem Relation Age of Onset  . Coronary artery disease Father        CABG, smoker .age onset 108s  . Prostate cancer Father   . Pulmonary fibrosis Father        and others  . Breast cancer Mother        age 47  . Heart disease Mother        CABG, smoker .age onset 50s  . Diverticulitis Mother   . Heart attack Brother        age 63  . Diabetes Neg Hx   . Colon cancer Neg Hx     Her Social History Is Significant For: Social History   Social History  . Marital status: Married     Spouse name: N/A  . Number of children: 0  . Years of education: N/A   Occupational History  . [employer], works from home    Social History Main Topics  . Smoking status: Never Smoker  . Smokeless tobacco: Never Used  . Alcohol use Yes     Comment: socially   . Drug use: No  . Sexual activity: Not Asked   Other Topics Concern  . None   Social History Narrative   Lives w/ husband    Her Allergies Are:  Allergies  Allergen Reactions  . Penicillins     REACTION: rash  . Erythromycin     Gastrointestinal issues.  :   Her Current Medications Are:  Outpatient Encounter Prescriptions as of 07/08/2017  Medication Sig  . aspirin 81 MG tablet Take 1 tablet (81 mg total) by mouth daily.  . Azelaic Acid (FINACEA) 15 % FOAM Apply 1 application topically 2 (two) times daily.  Marland Kitchen DIVIGEL 1 MG/GM GEL Take 1 mg by mouth daily.   Marland Kitchen L-Methylfolate-Algae (DEPLIN 7.5) 7.5-90.314 MG CAPS Take 1 capsule by mouth daily.   Marland Kitchen lisinopril (PRINIVIL,ZESTRIL) 10 MG tablet Take 1 tablet (10 mg total) by mouth daily.  . Multiple Vitamin (MULTIVITAMIN) tablet Take 1 tablet by mouth daily.  . nortriptyline (PAMELOR) 75 MG capsule Take 1 capsule (75 mg total) by mouth daily.  . Omega-3 Fatty Acids (FISH OIL PO) Take 2 tablets by mouth daily. chewables  . omeprazole (PRILOSEC) 40 MG capsule Take 1 capsule (40 mg total) by mouth daily.  . Probiotic Product (PROBIOTIC-10) CHEW Chew by mouth.  . progesterone (PROMETRIUM) 100 MG capsule Take 100 mg by mouth daily.  . ranitidine (ZANTAC) 150 MG tablet Take 150 mg by mouth 2 (two) times daily.  . rosuvastatin (CRESTOR) 10 MG tablet Take 1 tablet (10 mg total) by mouth at bedtime.   No facility-administered encounter medications on file as of 07/08/2017.   :  Review of Systems:  Out of a complete 14 point review of systems, all are reviewed and negative with the exception of these symptoms as listed below: Review of Systems  Neurological:       Pt presents  today to discuss her sleep. Pt has had a sleep study with Dr. Annamaria Boots many years ago and was on cpap but has not used cpap recently. Pt does endorse snoring.  Epworth Sleepiness Scale 0= would never doze 1= slight chance of dozing 2= moderate chance of dozing 3= high chance of dozing  Sitting and reading: 1 Watching TV: 1 Sitting inactive in a public place (ex. Theater or meeting): 1 As a passenger in a car for  an hour without a break: 3 Lying down to rest in the afternoon: 3 Sitting and talking to someone: 0 Sitting quietly after lunch (no alcohol): 1 In a car, while stopped in traffic: 0 Total: 10     Objective:  Neurological Exam  Physical Exam Physical Examination:   Vitals:   07/08/17 1546  BP: 129/78  Pulse: 93   General Examination: The patient is a very pleasant 56 y.o. female in no acute distress. She appears well-developed and well-nourished and well groomed.   HEENT: Normocephalic, atraumatic, pupils are equal, round and reactive to light and accommodation. Extraocular tracking is good without limitation to gaze excursion or nystagmus noted. Normal smooth pursuit is noted. Hearing is grossly intact. Face is symmetric with normal facial animation and normal facial sensation. Speech is clear with no dysarthria noted. There is no hypophonia. There is no lip, neck/head, jaw or voice tremor. Neck is supple with full range of passive and active motion. There are no carotid bruits on auscultation. Oropharynx exam reveals: mild mouth dryness, adequate dental hygiene and mild airway crowding, due to smaller airway entry and redundant soft palate. Mallampati is class III. Tongue protrudes centrally and palate elevates symmetrically. Tonsils are absent. Neck size is 16 inches. Nasal inspection reveals no significant nasal mucosal bogginess or redness and no septal deviation.   Chest: Clear to auscultation without wheezing, rhonchi or crackles noted.  Heart: S1+S2+0, regular and  normal without murmurs, rubs or gallops noted.   Abdomen: Soft, non-tender and non-distended with normal bowel sounds appreciated on auscultation.  Extremities: There is no pitting edema in the distal lower extremities bilaterally. Pedal pulses are intact.  Skin: Warm and dry without trophic changes noted. There are no varicose veins.  Musculoskeletal: exam reveals no obvious joint deformities, tenderness or joint swelling or erythema.   Neurologically:  Mental status: The patient is awake, alert and oriented in all 4 spheres. Her immediate and remote memory, attention, language skills and fund of knowledge are appropriate. There is no evidence of aphasia, agnosia, apraxia or anomia. Speech is clear with normal prosody and enunciation. Thought process is linear. Mood is normal and affect is normal.  Cranial nerves II - XII are as described above under HEENT exam. In addition: shoulder shrug is normal with equal shoulder height noted. Motor exam: Normal bulk, strength and tone is noted. There is no drift, tremor or rebound. Romberg is negative. Reflexes are 2+ throughout. Fine motor skills and coordination: intact with normal finger taps, normal hand movements, normal rapid alternating patting, normal foot taps and normal foot agility.  Cerebellar testing: No dysmetria or intention tremor on finger to nose testing. Heel to shin is unremarkable bilaterally. There is no truncal or gait ataxia.  Sensory exam: intact to light touch in the upper and lower extremities.  Gait, station and balance: She stands easily. No veering to one side is noted. No leaning to one side is noted. Posture is age-appropriate and stance is narrow based. Gait shows normal stride length and normal pace. No problems turning are noted. Tandem walk is unremarkable.   Assessment and Plan:  In summary, Rebekah [name] is a very pleasant 56 y.o.-year old female with an underlying medical history of hypertension, hyperlipidemia,  allergies, obesity with status post weight loss surgery nearly 10 years ago, reflux disease and depression, whose history and physical exam are in keeping with obstructive sleep apnea (OSA). In addition, she endorses intermittent restless leg symptoms. She had clinical improvement of her  OSA symptoms after she was able to lose weight but she gained weight back with time, currently plateaued, and that weight loss was about 30 pounds from before her weight loss surgery which was a couple of years after she had her sleep study.  I had a long chat with the patient about my findings and the diagnosis of OSA, its prognosis and treatment options. We talked about medical treatments, surgical interventions and non-pharmacological approaches. I explained in particular the risks and ramifications of untreated moderate to severe OSA, especially with respect to developing cardiovascular disease down the Road, including congestive heart failure, difficult to treat hypertension, cardiac arrhythmias, or stroke. Even type 2 diabetes has, in part, been linked to untreated OSA. Symptoms of untreated OSA include daytime sleepiness, memory problems, mood irritability and mood disorder such as depression and anxiety, lack of energy, as well as recurrent headaches, especially morning headaches. We talked about trying to maintain a healthy lifestyle in general, as well as the importance of weight control. I encouraged the patient to eat healthy, exercise daily and keep well hydrated, to keep a scheduled bedtime and wake time routine, to not skip any meals and eat healthy snacks in between meals. I advised the patient not to drive when feeling sleepy. I recommended the following at this time: sleep study with potential positive airway pressure titration. (We will score hypopneas at 4%).   I explained the sleep test procedure to the patient and also outlined possible surgical and non-surgical treatment options of OSA, including the use  of a custom-made dental device (which would require a referral to a specialist dentist or oral surgeon), upper airway surgical options, such as pillar implants, radiofrequency surgery, tongue base surgery, and UPPP (which would involve a referral to an ENT surgeon). Rarely, jaw surgery such as mandibular advancement may be considered.  I also explained the CPAP treatment option to the patient, who indicated that she would be willing to try CPAP if the need arises. I explained the importance of being compliant with PAP treatment, not only for insurance purposes but primarily to improve Her symptoms, and for the patient's long term health benefit, including to reduce Her cardiovascular risks. I answered all her questions today and the patient was in agreement. I would like to see her back after the sleep study is completed and encouraged her to call with any interim questions, concerns, problems or updates.   Thank you very much for allowing me to participate in the care of this nice patient. If I can be of any further assistance to you please do not hesitate to call me at [phone].  Sincerely,   Star Age, MD, PhD

## 2017-08-01 ENCOUNTER — Telehealth: Payer: Self-pay | Admitting: Internal Medicine

## 2017-08-01 NOTE — Telephone Encounter (Signed)
Spoke w/ Pt, informed that we did not have Shingrix at this time, recommended she try pharmacy. Pt verbalized understanding.

## 2017-08-01 NOTE — Telephone Encounter (Signed)
Please advise 

## 2017-08-01 NOTE — Telephone Encounter (Signed)
Relation to ME:QAST Call back number: [phone]   Reason for call:  Patient would like her shingle vaccination, please place on wait list.

## 2017-08-01 NOTE — Telephone Encounter (Signed)
If patient so desired, send a prescription for Rebekah [name] to her pharmacy.

## 2017-08-06 ENCOUNTER — Encounter: Payer: Self-pay | Admitting: Gastroenterology

## 2017-08-06 ENCOUNTER — Ambulatory Visit (INDEPENDENT_AMBULATORY_CARE_PROVIDER_SITE_OTHER): Payer: 59 | Admitting: Gastroenterology

## 2017-08-06 VITALS — BP 118/74 | HR 86 | Ht 64.0 in | Wt 195.0 lb

## 2017-08-06 DIAGNOSIS — Z86010 Personal history of colonic polyps: Secondary | ICD-10-CM | POA: Diagnosis not present

## 2017-08-06 DIAGNOSIS — K219 Gastro-esophageal reflux disease without esophagitis: Secondary | ICD-10-CM

## 2017-08-06 MED ORDER — NA SULFATE-K SULFATE-MG SULF 17.5-3.13-1.6 GM/177ML PO SOLN: 1.0000 | Freq: Once | ORAL | 0 refills | Status: AC

## 2017-08-06 NOTE — Patient Instructions (Signed)
You have been scheduled for an endoscopy and colonoscopy. Please follow the written instructions given to you at your visit today. Please pick up your prep supplies at the pharmacy within the next 1-3 days. If you use inhalers (even only as needed), please bring them with you on the day of your procedure. Your physician has requested that you go to www.startemmi.com and enter the access code given to you at your visit today. This web site gives a general overview about your procedure. However, you should still follow specific instructions given to you by our office regarding your preparation for the procedure.  Thank you for choosing me and [facility].  Malcolm T. Stark, Jr., MD., FACG  

## 2017-08-06 NOTE — Progress Notes (Signed)
History of Present Illness: This is a 56 year old female here for the evaluation of regurgitation. She has frequent nocturnal regurgitation. Small meals at night and elevating HOB have not helped. She is status post lap band inher symptoms have been bothersome for several years. She takes Zantac and Prilosec daily which is effective for controlling heartburn symptoms but has not controlled regurgitation. Denies weight loss, abdominal pain, constipation, diarrhea, change in stool caliber, melena, hematochezia, nausea, vomiting, dysphagia, chest pain.    Allergies  Allergen Reactions  . Penicillins     REACTION: rash  . Erythromycin     Gastrointestinal issues.   Outpatient Medications Prior to Visit  Medication Sig Dispense Refill  . aspirin 81 MG tablet Take 1 tablet (81 mg total) by mouth daily. 90 tablet 3  . Azelaic Acid (FINACEA) 15 % FOAM Apply 1 application topically 2 (two) times daily.    Marland Kitchen DIVIGEL 1 MG/GM GEL Take 1 mg by mouth daily.     Marland Kitchen L-Methylfolate-Algae (DEPLIN 7.5) 7.5-90.314 MG CAPS Take 1 capsule by mouth daily.     Marland Kitchen lisinopril (PRINIVIL,ZESTRIL) 10 MG tablet Take 1 tablet (10 mg total) by mouth daily. 90 tablet 1  . Multiple Vitamin (MULTIVITAMIN) tablet Take 1 tablet by mouth daily. 90 tablet 1  . nortriptyline (PAMELOR) 75 MG capsule Take 1 capsule (75 mg total) by mouth daily. 90 capsule 3  . Omega-3 Fatty Acids (FISH OIL PO) Take 2 tablets by mouth daily. chewables    . omeprazole (PRILOSEC) 40 MG capsule Take 1 capsule (40 mg total) by mouth daily. 90 capsule 1  . Probiotic Product (PROBIOTIC-10) CHEW Chew by mouth.    . progesterone (PROMETRIUM) 100 MG capsule Take 100 mg by mouth daily.    . ranitidine (ZANTAC) 150 MG tablet Take 150 mg by mouth 2 (two) times daily.    . rosuvastatin (CRESTOR) 10 MG tablet Take 1 tablet (10 mg total) by mouth at bedtime. 90 tablet 1   No facility-administered medications prior to visit.    Past Medical History:    Diagnosis Date  . Allergy   . Depression   . GERD (gastroesophageal reflux disease)   . Hyperlipidemia   . Hypertension   . IBS (irritable bowel syndrome)   . Obesity    Lap band surgery 12-08 Dr Hassell Done  . OSA (obstructive sleep apnea)    using a CPAP     Past Surgical History:  Procedure Laterality Date  . APPENDECTOMY    . BREAST REDUCTION SURGERY    . CHOLECYSTECTOMY    . CLOSED REDUCTION FINGER WITH PERCUTANEOUS PINNING Left 09/14/2014   Procedure: CLOSED REDUCTION PERCUTANEOUS PINNING ;  Surgeon: Leanora Cover, MD;  Location: [place];  Service: Orthopedics;  Laterality: Left;  . Lap Band Adjustment  09/01/2015   Dr. Redmond Pulling  . LAPAROSCOPIC GASTRIC BANDING  09/2007   Lap band surgery 12-08 Dr Hassell Done  . NASAL SINUS SURGERY    . TONSILLECTOMY     x2   Social History   Social History  . Marital status: Married    Spouse name: N/A  . Number of children: 0  . Years of education: N/A   Occupational History  . [employer], works from home    Social History Main Topics  . Smoking status: Never Smoker  . Smokeless tobacco: Never Used  . Alcohol use Yes     Comment: socially   . Drug use: No  . Sexual activity:  Yes    Partners: Male    Birth control/ protection: None   Other Topics Concern  . None   Social History Narrative   Lives w/ husband   Family History  Problem Relation Age of Onset  . Coronary artery disease Father        CABG, smoker .age onset 84s  . Prostate cancer Father   . Pulmonary fibrosis Father        and others  . Breast cancer Mother        age 49  . Heart disease Mother        CABG, smoker .age onset 64s  . Diverticulitis Mother   . Heart attack Brother        age 54  . Diabetes Neg Hx   . Colon cancer Neg Hx   . Stomach cancer Neg Hx       Review of Systems: Pertinent positive and negative review of systems were noted in the above HPI section. All other review of systems were otherwise negative.    Physical  Exam: General: Well developed, well nourished, no acute distress Head: Normocephalic and atraumatic Eyes:  sclerae anicteric, EOMI Ears: Normal auditory acuity Mouth: No deformity or lesions Neck: Supple, no masses or thyromegaly Lungs: Clear throughout to auscultation Heart: Regular rate and rhythm; no murmurs, rubs or bruits Abdomen: Soft, non tender and non distended. No masses, hepatosplenomegaly or hernias noted. Normal Bowel sounds Rectal: Deferred to colonoscopy Musculoskeletal: Symmetrical with no gross deformities  Skin: No lesions on visible extremities Pulses:  Normal pulses noted Extremities: No clubbing, cyanosis, edema or deformities noted Neurological: Alert oriented x 4, grossly nonfocal Cervical Nodes:  No significant cervical adenopathy Inguinal Nodes: No significant inguinal adenopathy Psychological:  Alert and cooperative. Normal mood and affect  Assessment and Recommendations:  1.  GERD, regurgitation. Very light evening meal no closer than 4 hours before bedtime. The risks (including bleeding, perforation, infection, missed lesions, medication reactions and possible hospitalization or surgery if complications occur), benefits, and alternatives to endoscopy with possible biopsy and possible dilation were discussed with the patient and they consent to proceed.   2. Personal history of adenomatous colon polyps. Due for 5 year surveillance colonoscopy. The risks (including bleeding, perforation, infection, missed lesions, medication reactions and possible hospitalization or surgery if complications occur), benefits, and alternatives to colonoscopy with possible biopsy and possible polypectomy were discussed with the patient and they consent to proceed.

## 2017-08-21 ENCOUNTER — Telehealth: Payer: Self-pay | Admitting: Neurology

## 2017-08-21 DIAGNOSIS — G4733 Obstructive sleep apnea (adult) (pediatric): Secondary | ICD-10-CM

## 2017-08-21 DIAGNOSIS — I10 Essential (primary) hypertension: Secondary | ICD-10-CM

## 2017-08-21 NOTE — Addendum Note (Signed)
Addended by: Larey Seat on: 08/21/2017 05:30 PM   Modules accepted: Orders

## 2017-08-21 NOTE — Telephone Encounter (Signed)
[employer] denied split sleep study.  Can I get an order for a HST?

## 2017-08-26 ENCOUNTER — Encounter: Payer: Self-pay | Admitting: Gastroenterology

## 2017-08-27 ENCOUNTER — Encounter: Payer: Self-pay | Admitting: Internal Medicine

## 2017-08-27 MED ORDER — LISINOPRIL 10 MG PO TABS: 10.0000 mg | ORAL_TABLET | Freq: Every day | ORAL | 2 refills | Status: DC

## 2017-08-28 ENCOUNTER — Ambulatory Visit (INDEPENDENT_AMBULATORY_CARE_PROVIDER_SITE_OTHER): Payer: 59 | Admitting: Neurology

## 2017-08-28 DIAGNOSIS — G4733 Obstructive sleep apnea (adult) (pediatric): Secondary | ICD-10-CM

## 2017-08-28 DIAGNOSIS — I10 Essential (primary) hypertension: Secondary | ICD-10-CM

## 2017-08-29 LAB — HM MAMMOGRAPHY

## 2017-08-29 LAB — HM PAP SMEAR

## 2017-09-03 ENCOUNTER — Telehealth: Payer: Self-pay | Admitting: Neurology

## 2017-09-03 DIAGNOSIS — G4733 Obstructive sleep apnea (adult) (pediatric): Secondary | ICD-10-CM

## 2017-09-03 NOTE — Telephone Encounter (Signed)
I called pt. I advised pt that Dr. Rexene Alberts reviewed their sleep study results and found that pt has mild or borderline osa. Dr. Rexene Alberts recommends that pt start an auto pap at home. I reviewed PAP compliance expectations with the pt. Pt is agreeable to starting an auto-PAP. I advised pt that an order will be sent to a DME, [facility], and [facility] will call the pt within about one week after they file with the pt's insurance. [facility] will show the pt how to use the machine, fit for masks, and troubleshoot the auto-PAP if needed. A follow up appt was made for insurance purposes with Dr. Rexene Alberts on 11/25/17 at 3:00pm. Pt verbalized understanding to arrive 15 minutes early and bring their auto-PAP. A letter with all of this information in it will be mailed to the pt as a reminder. I verified with the pt that the address we have on file is correct. Pt verbalized understanding of results. Pt had no questions at this time but was encouraged to call back if questions arise.

## 2017-09-03 NOTE — Procedures (Signed)
  [facility] [place], [place] [id number] NAME: Rebekah [name]                                                            DOB: [birth year] MEDICAL RECORD NUMBER [id number]                                          DOS: 08/28/17 REFERRING PHYSICIAN: Kathlene November, MD STUDY PERFORMED: Home Sleep Study HISTORY: 56 year old woman with a history of hypertension, hyperlipidemia, allergies, obesity with status post weight loss surgery nearly 10 years ago, reflux disease and depression, who was previously diagnosed with obstructive sleep apnea several years ago and placed on CPAP. Since her weight gain in the past years she has started snoring again and does not feel rested.  Her Epworth sleepiness score is 10 out of 24 today, BMI of 33.8.  STUDY RESULTS: Total Duration of Valid Test time: 8 Hours, 3 Minutes Total Apnea/Hypopnea Index (AHI):  5.8/Hour Average Oxygen Saturation:  94 % Lowest Oxygen Saturation:   75%  Time Oxygen Saturation Below or at 88%:  3 min (1%) Average Heart Rate:       71 BPM IMPRESSION: OSA RECOMMENDATION: This home sleep test demonstrates overall mild or borderline obstructive sleep apnea with a total AHI of 5.8/hour and O2 nadir of 75%. Given the patient's medical history and sleep related complaints, treatment with positive airway pressure is recommended. The treatment can be achieved in the form of autoPAP trial/titration at home. A full night CPAP titration study will help with proper treatment settings and mask fitting, if needed. Please note that untreated obstructive sleep apnea carries additional perioperative morbidity. Patients with significant obstructive sleep apnea should receive perioperative PAP therapy and the surgeons and particularly the anesthesiologist should be informed of the diagnosis and the severity of the sleep disordered breathing. The patient should be cautioned not to drive, work at heights, or operate dangerous or  heavy equipment when tired or sleepy. Review and reiteration of good sleep hygiene measures should be pursued with any patient. The patient and his referring provider will be notified of the test results. The patient will be seen in follow up in sleep clinic at [facility]. I certify that I have reviewed the raw data recording prior to the issuance of this report in accordance with the standards of the American Academy of Sleep Medicine (AASM).  Star Age, MD, PhD [facility]) [place], ABPN (Neurology and Sleep Medicine)

## 2017-09-03 NOTE — Telephone Encounter (Signed)
Could not make result note for HST.  Patient referred by Dr. Larose Kells, seen by me on 07/08/17, HST on 08/28/17.    Please call and notify the patient that the recent home sleep test showed mild or borderline OSA. Given her sleep related complaints, it may be worth treating to see if she feels better after treatment. To that end I recommend treatment for this in the form of autoPAP, which means, that we don't have to bring her in for a sleep study with CPAP, but will let her try an autoPAP machine at home, through a DME company (of her choice, or as per insurance requirement). The DME representative will educate her on how to use the machine, how to put the mask on, etc. I have placed an order in the chart. Please send referral, talk to patient, send report to PCP and referring MD. We will need a FU in sleep clinic for 10 weeks post-PAP set up, please arrange that as well. Thanks,   Star Age, MD, PhD [facility])

## 2017-09-06 ENCOUNTER — Ambulatory Visit (AMBULATORY_SURGERY_CENTER): Payer: 59 | Admitting: Gastroenterology

## 2017-09-06 ENCOUNTER — Encounter: Payer: Self-pay | Admitting: Gastroenterology

## 2017-09-06 ENCOUNTER — Other Ambulatory Visit: Payer: Self-pay

## 2017-09-06 VITALS — BP 170/92 | HR 84 | Temp 98.6°F | Resp 15 | Ht 64.0 in | Wt 195.0 lb

## 2017-09-06 DIAGNOSIS — R1110 Vomiting, unspecified: Secondary | ICD-10-CM

## 2017-09-06 DIAGNOSIS — Z86010 Personal history of colonic polyps: Secondary | ICD-10-CM | POA: Diagnosis not present

## 2017-09-06 DIAGNOSIS — K2900 Acute gastritis without bleeding: Secondary | ICD-10-CM

## 2017-09-06 DIAGNOSIS — K219 Gastro-esophageal reflux disease without esophagitis: Secondary | ICD-10-CM

## 2017-09-06 DIAGNOSIS — K2950 Unspecified chronic gastritis without bleeding: Secondary | ICD-10-CM | POA: Diagnosis not present

## 2017-09-06 MED ORDER — SODIUM CHLORIDE 0.9 % IV SOLN: 500.0000 mL | INTRAVENOUS | Status: DC

## 2017-09-06 NOTE — Patient Instructions (Signed)
YOU HAD AN ENDOSCOPIC PROCEDURE TODAY AT [facility]:   Refer to the procedure report that was given to you for any specific questions about what was found during the examination.  If the procedure report does not answer your questions, please call your gastroenterologist to clarify.  If you requested that your care partner not be given the details of your procedure findings, then the procedure report has been included in a sealed envelope for you to review at your convenience later.  YOU SHOULD EXPECT: Some feelings of bloating in the abdomen. Passage of more gas than usual.  Walking can help get rid of the air that was put into your GI tract during the procedure and reduce the bloating. If you had a lower endoscopy (such as a colonoscopy or flexible sigmoidoscopy) you may notice spotting of blood in your stool or on the toilet paper. If you underwent a bowel prep for your procedure, you may not have a normal bowel movement for a few days.  Please Note:  You might notice some irritation and congestion in your nose or some drainage.  This is from the oxygen used during your procedure.  There is no need for concern and it should clear up in a day or so.  SYMPTOMS TO REPORT IMMEDIATELY:   Following lower endoscopy (colonoscopy or flexible sigmoidoscopy):  Excessive amounts of blood in the stool  Significant tenderness or worsening of abdominal pains  Swelling of the abdomen that is new, acute  Fever of 100F or higher   Following upper endoscopy (EGD)  Vomiting of blood or coffee ground material  New chest pain or pain under the shoulder blades  Painful or persistently difficult swallowing  New shortness of breath  Fever of 100F or higher  Black, tarry-looking stools  For urgent or emergent issues, a gastroenterologist can be reached at any hour by calling [phone].   DIET:  We do recommend a small meal at first, but then you may proceed to your regular diet.  Drink  plenty of fluids but you should avoid alcoholic beverages for 24 hours.  ACTIVITY:  You should plan to take it easy for the rest of today and you should NOT DRIVE or use heavy machinery until tomorrow (because of the sedation medicines used during the test).    FOLLOW UP: Our staff will call the number listed on your records the next business day following your procedure to check on you and address any questions or concerns that you may have regarding the information given to you following your procedure. If we do not reach you, we will leave a message.  However, if you are feeling well and you are not experiencing any problems, there is no need to return our call.  We will assume that you have returned to your regular daily activities without incident.  If any biopsies were taken you will be contacted by phone or by letter within the next 1-3 weeks.  Please call us at [phone] if you have not heard about the biopsies in 3 weeks.    SIGNATURES/CONFIDENTIALITY: You and/or your care partner have signed paperwork which will be entered into your electronic medical record.  These signatures attest to the fact that that the information above on your After Visit Summary has been reviewed and is understood.  Full responsibility of the confidentiality of this discharge information lies with you and/or your care-partner.  Dr. Lynne Leader nurse on the 3rd floor will call you to  schedule your manometry and surgical consult.

## 2017-09-06 NOTE — Progress Notes (Signed)
To recovery, report to RN, VSS. 

## 2017-09-06 NOTE — Progress Notes (Signed)
Called to room to assist during endoscopic procedure.  Patient ID and intended procedure confirmed with present staff. Received instructions for my participation in the procedure from the performing physician.  

## 2017-09-06 NOTE — Op Note (Addendum)
[facility] Patient Name: Rebekah [name] Procedure Date: 09/06/2017 2:12 PM MRN: [id number] Endoscopist: Ladene Artist , MD Age: 56 Referring MD:  Date of Birth: 1961/08/12 Gender: Female Account #: [id number] Procedure:                Upper GI endoscopy Indications:              Suspected gastroesophageal reflux disease,                            Regurgitation Medicines:                Monitored Anesthesia Care Procedure:                Pre-Anesthesia Assessment:                           - Prior to the procedure, a History and Physical                            was performed, and patient medications and                            allergies were reviewed. The patient's tolerance of                            previous anesthesia was also reviewed. The risks                            and benefits of the procedure and the sedation                            options and risks were discussed with the patient.                            All questions were answered, and informed consent                            was obtained. Prior Anticoagulants: The patient has                            taken no previous anticoagulant or antiplatelet                            agents. ASA Grade Assessment: II - A patient with                            mild systemic disease. After reviewing the risks                            and benefits, the patient was deemed in                            satisfactory condition to undergo the procedure.  After obtaining informed consent, the endoscope was                            passed under direct vision. Throughout the                            procedure, the patient's blood pressure, pulse, and                            oxygen saturations were monitored continuously. The                            Endoscope was introduced through the mouth, and                            advanced to the second part of duodenum. The  upper                            GI endoscopy was accomplished without difficulty.                            The patient tolerated the procedure well. Scope In: Scope Out: Findings:                 The lumen of the proximal esophagus, mid esophagus                            and distal esophagus was moderately dilated.                           Diffuse moderate inflammation characterized by                            congestion (edema) and erosions was found in the                            mid esophagus and in the distal esophagus.                           Fluid was found in the proximal esophagus and in                            the mid esophagus.                           The exam of the esophagus was otherwise normal.                           Localized mild inflammation characterized by                            erythema and granularity was found in the gastric                            body and  in the gastric antrum. Biopsies were taken                            with a cold forceps for histology.                           An extrinsic moderate stenosis was found in the                            gastric fundus from prior lap band. This was                            traversed.                           The exam of the stomach was otherwise normal.                           The duodenal bulb and second portion of the                            duodenum were normal. Complications:            No immediate complications. Estimated Blood Loss:     Estimated blood loss was minimal. Impression:               - Dilation in the proximal esophagus, in the mid                            esophagus and in the distal esophagus.                           - Esophageal mucosal changes were present,                            including congestion (edema) and erosions. Findings                            are suggestive of inflammation due to stasis.                           - Fluid in the  proximal esophagus and in the mid                            esophagus.                           - Gastritis. Biopsied.                           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                           -  Normal duodenal bulb and second portion of the                            duodenum. Recommendation:           - Patient has a contact number available for                            emergencies. The signs and symptoms of potential                            delayed complications were discussed with the                            patient. Return to normal activities tomorrow.                            Written discharge instructions were provided to the                            patient.                           - Resume previous diet.                           - Continue present medications.                           - Await pathology results.                           - Surgical referral to fully deflate her lap band.                           - Perform routine esophageal manometry at the next                            available appointment after lap band is fully                            deflated.                           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 Ladene Artist, MD 09/06/2017 3:01:24 PM This report has been signed electronically.

## 2017-09-06 NOTE — Op Note (Signed)
[place] Patient Name: Rebekah [name] Procedure Date: 09/06/2017 2:12 PM MRN: [id number] Endoscopist: Ladene Artist , MD Age: 56 Referring MD:  Date of Birth: [birth year] Gender: Female Account #: [id number] Procedure:                Colonoscopy Indications:              Surveillance: Personal history of adenomatous                            polyps on last colonoscopy 5 years ago Medicines:                Monitored Anesthesia Care Procedure:                Pre-Anesthesia Assessment:                           - Prior to the procedure, a History and Physical                            was performed, and patient medications and                            allergies were reviewed. The patient's tolerance of                            previous anesthesia was also reviewed. The risks                            and benefits of the procedure and the sedation                            options and risks were discussed with the patient.                            All questions were answered, and informed consent                            was obtained. Prior Anticoagulants: The patient has                            taken no previous anticoagulant or antiplatelet                            agents. ASA Grade Assessment: II - A patient with                            mild systemic disease. After reviewing the risks                            and benefits, the patient was deemed in                            satisfactory condition to undergo the procedure.  After obtaining informed consent, the colonoscope                            was passed under direct vision. Throughout the                            procedure, the patient's blood pressure, pulse, and                            oxygen saturations were monitored continuously. The                            Colonoscope was introduced through the anus and                            advanced to the the cecum,  identified by                            appendiceal orifice and ileocecal valve. The                            ileocecal valve, appendiceal orifice, and rectum                            were photographed. The quality of the bowel                            preparation was adequate after extensive lavage and                            suctioning. The patient tolerated the procedure                            well. The colonoscopy was somewhat difficult due to                            significant looping and a tortuous colon.                            Successful completion of the procedure was aided by                            using manual pressure, withdrawing and reinserting                            the scope, straightening and shortening the scope                            to obtain bowel loop reduction and using scope                            torsion. Scope In: 2:17:33 PM Scope Out: 2:39:46 PM Scope Withdrawal Time: 0 hours 14 minutes 29 seconds  Total Procedure Duration: 0 hours 22  minutes 13 seconds  Findings:                 The perianal and digital rectal examinations were                            normal.                           Internal hemorrhoids were found during                            retroflexion. The hemorrhoids were small and Grade                            I (internal hemorrhoids that do not prolapse).                           The exam was otherwise without abnormality on                            direct and retroflexion views. Complications:            No immediate complications. Estimated blood loss:                            None. Estimated Blood Loss:     Estimated blood loss: none. Impression:               - Internal hemorrhoids.                           - The examination was otherwise normal on direct                            and retroflexion views.                           - No specimens collected. Recommendation:           - Repeat  colonoscopy in 5 years for surveillance                            with a more extensive bowel.                           - Patient has a contact number available for                            emergencies. The signs and symptoms of potential                            delayed complications were discussed with the                            patient. Return to normal activities tomorrow.  Written discharge instructions were provided to the                            patient.                           - Resume previous diet.                           - Continue present medications.                           - Await pathology results. Ladene Artist, MD 09/06/2017 2:44:08 PM This report has been signed electronically.

## 2017-09-09 ENCOUNTER — Telehealth: Payer: Self-pay

## 2017-09-09 ENCOUNTER — Telehealth: Payer: Self-pay | Admitting: *Deleted

## 2017-09-09 NOTE — Telephone Encounter (Signed)
  Follow up Call-  Call back number 09/06/2017  Post procedure Call Back phone  # [phone]  Permission to leave phone message Yes  Some recent data might be hidden     Patient questions:  Do you have a fever, pain , or abdominal swelling? No. Pain Score  0 *  Have you tolerated food without any problems? Yes.    Have you been able to return to your normal activities? Yes.    Do you have any questions about your discharge instructions: Diet   No. Medications  No. Follow up visit  No.  Do you have questions or concerns about your Care? No.  Actions: * If pain score is 4 or above: No action needed, pain <4.

## 2017-09-09 NOTE — Telephone Encounter (Signed)
Informed patient we have her set up for her [facility] appt on 09/11/17 at 11:45am to see Carlena Hurl, PA. Patient verbalized understanding.

## 2017-09-10 ENCOUNTER — Other Ambulatory Visit: Payer: Self-pay

## 2017-09-11 ENCOUNTER — Telehealth: Payer: Self-pay

## 2017-09-11 NOTE — Telephone Encounter (Signed)
Pt scheduled for appt with [facility] 09/11/17@11 :45am to have fluid withdrawn from lapband. Pt scheduled for EM at [facility] 09/23/17@8 :30am, pt to arrive there at 8am and be NPO after midnight. Pt aware of appts.

## 2017-09-18 ENCOUNTER — Encounter: Payer: Self-pay | Admitting: Gastroenterology

## 2017-09-23 ENCOUNTER — Ambulatory Visit (HOSPITAL_COMMUNITY)
Admission: RE | Admit: 2017-09-23 | Discharge: 2017-09-23 | Disposition: A | Discharge status: Home / Self Care | Payer: 59 | Source: Ambulatory Visit | Attending: Gastroenterology | Admitting: Gastroenterology

## 2017-09-23 ENCOUNTER — Encounter (HOSPITAL_COMMUNITY)
Admission: RE | Disposition: A | Discharge status: Home / Self Care | Payer: Self-pay | Source: Ambulatory Visit | Attending: Gastroenterology

## 2017-09-23 DIAGNOSIS — K224 Dyskinesia of esophagus: Secondary | ICD-10-CM | POA: Insufficient documentation

## 2017-09-23 DIAGNOSIS — R1310 Dysphagia, unspecified: Secondary | ICD-10-CM | POA: Diagnosis not present

## 2017-09-23 DIAGNOSIS — K219 Gastro-esophageal reflux disease without esophagitis: Secondary | ICD-10-CM | POA: Diagnosis not present

## 2017-09-23 HISTORY — PX: ESOPHAGEAL MANOMETRY: SHX5429

## 2017-09-23 SURGERY — MANOMETRY, ESOPHAGUS
Anesthesia: LOCAL

## 2017-09-23 MED ORDER — LIDOCAINE VISCOUS 2 % MT SOLN
OROMUCOSAL | Status: AC
  Filled 2017-09-23: qty 15

## 2017-09-23 SURGICAL SUPPLY — 2 items
FACESHIELD LNG OPTICON STERILE (SAFETY) IMPLANT
GLOVE BIO SURGEON STRL SZ8 (GLOVE) ×4 IMPLANT

## 2017-09-24 ENCOUNTER — Encounter (HOSPITAL_COMMUNITY): Payer: Self-pay | Admitting: Gastroenterology

## 2017-09-24 ENCOUNTER — Encounter: Payer: Self-pay | Admitting: Gastroenterology

## 2017-09-24 NOTE — Progress Notes (Signed)
Esophageal manometry done per protocol.  Patient tolerated well.  Report sent to Dr Kavitha Nandigam.   

## 2017-09-27 DIAGNOSIS — R1310 Dysphagia, unspecified: Secondary | ICD-10-CM

## 2017-09-28 ENCOUNTER — Emergency Department (HOSPITAL_COMMUNITY): Payer: 59

## 2017-09-28 ENCOUNTER — Inpatient Hospital Stay (HOSPITAL_COMMUNITY)
Admission: EM | Admit: 2017-09-28 | Discharge: 2017-09-30 | DRG: 390 | Disposition: A | Discharge status: Home / Self Care | Payer: 59 | Attending: Internal Medicine | Admitting: Internal Medicine

## 2017-09-28 ENCOUNTER — Inpatient Hospital Stay (HOSPITAL_COMMUNITY): Payer: 59

## 2017-09-28 ENCOUNTER — Encounter (HOSPITAL_COMMUNITY): Payer: Self-pay | Admitting: Emergency Medicine

## 2017-09-28 ENCOUNTER — Other Ambulatory Visit: Payer: Self-pay

## 2017-09-28 DIAGNOSIS — E785 Hyperlipidemia, unspecified: Secondary | ICD-10-CM | POA: Diagnosis present

## 2017-09-28 DIAGNOSIS — Z9884 Bariatric surgery status: Secondary | ICD-10-CM

## 2017-09-28 DIAGNOSIS — Z6832 Body mass index (BMI) 32.0-32.9, adult: Secondary | ICD-10-CM

## 2017-09-28 DIAGNOSIS — Z0189 Encounter for other specified special examinations: Secondary | ICD-10-CM

## 2017-09-28 DIAGNOSIS — G4733 Obstructive sleep apnea (adult) (pediatric): Secondary | ICD-10-CM | POA: Diagnosis present

## 2017-09-28 DIAGNOSIS — K56600 Partial intestinal obstruction, unspecified as to cause: Principal | ICD-10-CM | POA: Diagnosis present

## 2017-09-28 DIAGNOSIS — I10 Essential (primary) hypertension: Secondary | ICD-10-CM | POA: Diagnosis present

## 2017-09-28 DIAGNOSIS — Z79899 Other long term (current) drug therapy: Secondary | ICD-10-CM

## 2017-09-28 DIAGNOSIS — Z7982 Long term (current) use of aspirin: Secondary | ICD-10-CM | POA: Diagnosis not present

## 2017-09-28 DIAGNOSIS — K56609 Unspecified intestinal obstruction, unspecified as to partial versus complete obstruction: Secondary | ICD-10-CM | POA: Diagnosis present

## 2017-09-28 DIAGNOSIS — Z881 Allergy status to other antibiotic agents status: Secondary | ICD-10-CM

## 2017-09-28 DIAGNOSIS — K219 Gastro-esophageal reflux disease without esophagitis: Secondary | ICD-10-CM | POA: Diagnosis present

## 2017-09-28 DIAGNOSIS — E669 Obesity, unspecified: Secondary | ICD-10-CM | POA: Diagnosis present

## 2017-09-28 DIAGNOSIS — Z880 Allergy status to penicillin: Secondary | ICD-10-CM

## 2017-09-28 DIAGNOSIS — Z4659 Encounter for fitting and adjustment of other gastrointestinal appliance and device: Secondary | ICD-10-CM

## 2017-09-28 DIAGNOSIS — K589 Irritable bowel syndrome without diarrhea: Secondary | ICD-10-CM | POA: Diagnosis present

## 2017-09-28 DIAGNOSIS — F329 Major depressive disorder, single episode, unspecified: Secondary | ICD-10-CM | POA: Diagnosis present

## 2017-09-28 DIAGNOSIS — Z978 Presence of other specified devices: Secondary | ICD-10-CM

## 2017-09-28 LAB — URINALYSIS, ROUTINE W REFLEX MICROSCOPIC
BILIRUBIN URINE: NEGATIVE
Glucose, UA: NEGATIVE mg/dL
Hgb urine dipstick: NEGATIVE
Ketones, ur: NEGATIVE mg/dL
Leukocytes, UA: NEGATIVE
NITRITE: NEGATIVE
PH: 6 (ref 5.0–8.0)
Protein, ur: NEGATIVE mg/dL
SPECIFIC GRAVITY, URINE: 1.025 (ref 1.005–1.030)

## 2017-09-28 LAB — COMPREHENSIVE METABOLIC PANEL
ALT: 32 U/L (ref 14–54)
AST: 36 U/L (ref 15–41)
Albumin: 4.1 g/dL (ref 3.5–5.0)
Alkaline Phosphatase: 88 U/L (ref 38–126)
Anion gap: 11 (ref 5–15)
BUN: 13 mg/dL (ref 6–20)
CO2: 24 mmol/L (ref 22–32)
Calcium: 9.9 mg/dL (ref 8.9–10.3)
Chloride: 97 mmol/L — ABNORMAL LOW (ref 101–111)
Creatinine, Ser: 0.77 mg/dL (ref 0.44–1.00)
GFR calc Af Amer: >60 mL/min (ref >60)
GFR calc non Af Amer: >60 mL/min (ref >60)
Glucose, Bld: 126 mg/dL — ABNORMAL HIGH (ref 65–99)
Potassium: 4.2 mmol/L (ref 3.5–5.1)
Sodium: 132 mmol/L — ABNORMAL LOW (ref 135–145)
Total Bilirubin: 0.3 mg/dL (ref 0.3–1.2)
Total Protein: 8 g/dL (ref 6.5–8.1)

## 2017-09-28 LAB — CBC
HEMATOCRIT: 45.1 % (ref 36.0–46.0)
HEMOGLOBIN: 15.3 g/dL — AB (ref 12.0–15.0)
MCH: 28.2 pg (ref 26.0–34.0)
MCHC: 33.9 g/dL (ref 30.0–36.0)
MCV: 83.2 fL (ref 78.0–100.0)
Platelets: 365 10*3/uL (ref 150–400)
RBC: 5.42 MIL/uL — ABNORMAL HIGH (ref 3.87–5.11)
RDW: 16.8 % — ABNORMAL HIGH (ref 11.5–15.5)
WBC: 18.2 10*3/uL — ABNORMAL HIGH (ref 4.0–10.5)

## 2017-09-28 LAB — LIPASE, BLOOD: Lipase: 30 U/L (ref 11–51)

## 2017-09-28 LAB — I-STAT BETA HCG BLOOD, ED (MC, WL, AP ONLY): I-stat hCG, quantitative: <5 m[IU]/mL (ref <5)

## 2017-09-28 MED ORDER — ONDANSETRON HCL 4 MG/2ML IJ SOLN: 4.0000 mg | Freq: Four times a day (QID) | INTRAMUSCULAR | Status: DC | PRN

## 2017-09-28 MED ORDER — ALUM & MAG HYDROXIDE-SIMETH 200-200-20 MG/5ML PO SUSP: 30.0000 mL | Freq: Four times a day (QID) | ORAL | Status: DC | PRN

## 2017-09-28 MED ORDER — LIP MEDEX EX OINT
1.0000 "application " | TOPICAL_OINTMENT | Freq: Two times a day (BID) | CUTANEOUS | Status: DC
  Administered 2017-09-28 – 2017-09-30 (×4): 1 via TOPICAL
  Filled 2017-09-28: qty 7

## 2017-09-28 MED ORDER — BISACODYL 10 MG RE SUPP: 10.0000 mg | Freq: Two times a day (BID) | RECTAL | Status: DC | PRN

## 2017-09-28 MED ORDER — MORPHINE SULFATE (PF) 4 MG/ML IV SOLN
4.0000 mg | Freq: Once | INTRAVENOUS | Status: AC
  Administered 2017-09-28: 4 mg via INTRAVENOUS
  Filled 2017-09-28: qty 1

## 2017-09-28 MED ORDER — MENTHOL 3 MG MT LOZG: 1.0000 | LOZENGE | OROMUCOSAL | Status: DC | PRN

## 2017-09-28 MED ORDER — HYDROCORTISONE 2.5 % RE CREA
1.0000 "application " | TOPICAL_CREAM | Freq: Four times a day (QID) | RECTAL | Status: DC | PRN
  Filled 2017-09-28: qty 28.35

## 2017-09-28 MED ORDER — METHOCARBAMOL 1000 MG/10ML IJ SOLN
1000.0000 mg | Freq: Four times a day (QID) | INTRAVENOUS | Status: DC | PRN
  Filled 2017-09-28: qty 10

## 2017-09-28 MED ORDER — DIPHENHYDRAMINE HCL 50 MG/ML IJ SOLN: 12.5000 mg | Freq: Four times a day (QID) | INTRAMUSCULAR | Status: DC | PRN

## 2017-09-28 MED ORDER — ESTRADIOL 1 MG/GM TD GEL: 1.0000 mg | Freq: Every day | TRANSDERMAL | Status: DC

## 2017-09-28 MED ORDER — SODIUM CHLORIDE 0.9 % IV SOLN
25.0000 mg | Freq: Four times a day (QID) | INTRAVENOUS | Status: DC | PRN
  Filled 2017-09-28: qty 1

## 2017-09-28 MED ORDER — SODIUM CHLORIDE 0.9 % IV BOLUS (SEPSIS)
1000.0000 mL | Freq: Once | INTRAVENOUS | Status: AC
  Administered 2017-09-28: 1000 mL via INTRAVENOUS

## 2017-09-28 MED ORDER — SODIUM CHLORIDE 0.9 % IV SOLN
INTRAVENOUS | Status: DC
  Administered 2017-09-28 – 2017-09-30 (×4): via INTRAVENOUS

## 2017-09-28 MED ORDER — ENOXAPARIN SODIUM 40 MG/0.4ML ~~LOC~~ SOLN
40.0000 mg | SUBCUTANEOUS | Status: DC
  Administered 2017-09-28 – 2017-09-29 (×2): 40 mg via SUBCUTANEOUS
  Filled 2017-09-28 (×3): qty 0.4

## 2017-09-28 MED ORDER — METOCLOPRAMIDE HCL 5 MG/ML IJ SOLN: 5.0000 mg | Freq: Four times a day (QID) | INTRAMUSCULAR | Status: DC | PRN

## 2017-09-28 MED ORDER — ONDANSETRON HCL 4 MG PO TABS: 4.0000 mg | ORAL_TABLET | Freq: Four times a day (QID) | ORAL | Status: DC | PRN

## 2017-09-28 MED ORDER — GUAIFENESIN-DM 100-10 MG/5ML PO SYRP: 10.0000 mL | ORAL_SOLUTION | ORAL | Status: DC | PRN

## 2017-09-28 MED ORDER — ONDANSETRON HCL 4 MG/2ML IJ SOLN
4.0000 mg | Freq: Once | INTRAMUSCULAR | Status: AC
  Administered 2017-09-28: 4 mg via INTRAVENOUS
  Filled 2017-09-28: qty 2

## 2017-09-28 MED ORDER — HYDRALAZINE HCL 20 MG/ML IJ SOLN: 5.0000 mg | Freq: Four times a day (QID) | INTRAMUSCULAR | Status: DC | PRN

## 2017-09-28 MED ORDER — DEXAMETHASONE SODIUM PHOSPHATE 4 MG/ML IJ SOLN
4.0000 mg | Freq: Two times a day (BID) | INTRAMUSCULAR | Status: DC
  Administered 2017-09-28 – 2017-09-30 (×4): 4 mg via INTRAVENOUS
  Filled 2017-09-28 (×4): qty 1

## 2017-09-28 MED ORDER — ORAL CARE MOUTH RINSE
15.0000 mL | Freq: Two times a day (BID) | OROMUCOSAL | Status: DC
  Administered 2017-09-30: 15 mL via OROMUCOSAL

## 2017-09-28 MED ORDER — ESTRADIOL 1 MG PO TABS
1.0000 mg | ORAL_TABLET | Freq: Every day | ORAL | Status: DC
  Administered 2017-09-29: 1 mg via ORAL
  Filled 2017-09-28 (×3): qty 1

## 2017-09-28 MED ORDER — MORPHINE SULFATE (PF) 4 MG/ML IV SOLN
2.0000 mg | INTRAVENOUS | Status: DC | PRN
  Filled 2017-09-28: qty 1

## 2017-09-28 MED ORDER — PROCHLORPERAZINE EDISYLATE 5 MG/ML IJ SOLN: 5.0000 mg | INTRAMUSCULAR | Status: DC | PRN

## 2017-09-28 MED ORDER — MAGIC MOUTHWASH
15.0000 mL | Freq: Four times a day (QID) | ORAL | Status: DC | PRN
  Filled 2017-09-28: qty 15

## 2017-09-28 MED ORDER — CHLORHEXIDINE GLUCONATE 0.12 % MT SOLN
15.0000 mL | Freq: Two times a day (BID) | OROMUCOSAL | Status: DC
  Administered 2017-09-28 – 2017-09-30 (×4): 15 mL via OROMUCOSAL
  Filled 2017-09-28 (×4): qty 15

## 2017-09-28 MED ORDER — LACTATED RINGERS IV BOLUS (SEPSIS)
1000.0000 mL | Freq: Once | INTRAVENOUS | Status: AC
Start: 2017-09-28 — End: 2017-09-29
  Administered 2017-09-29: 1000 mL via INTRAVENOUS

## 2017-09-28 MED ORDER — LACTATED RINGERS IV BOLUS (SEPSIS): 1000.0000 mL | Freq: Three times a day (TID) | INTRAVENOUS | Status: DC | PRN

## 2017-09-28 MED ORDER — HYDROCORTISONE 1 % EX CREA
1.0000 "application " | TOPICAL_CREAM | Freq: Three times a day (TID) | CUTANEOUS | Status: DC | PRN
  Filled 2017-09-28: qty 28

## 2017-09-28 MED ORDER — MORPHINE SULFATE (PF) 4 MG/ML IV SOLN
3.0000 mg | Freq: Once | INTRAVENOUS | Status: AC
  Administered 2017-09-28: 3 mg via INTRAVENOUS
  Filled 2017-09-28: qty 1

## 2017-09-28 MED ORDER — ACETAMINOPHEN 650 MG RE SUPP: 650.0000 mg | Freq: Four times a day (QID) | RECTAL | Status: DC | PRN

## 2017-09-28 MED ORDER — SODIUM CHLORIDE 0.9 % IV SOLN
8.0000 mg | Freq: Four times a day (QID) | INTRAVENOUS | Status: DC | PRN
  Filled 2017-09-28: qty 4

## 2017-09-28 MED ORDER — IOPAMIDOL (ISOVUE-300) INJECTION 61%
INTRAVENOUS | Status: AC
  Administered 2017-09-28: 100 mL
  Filled 2017-09-28: qty 100

## 2017-09-28 MED ORDER — PHENOL 1.4 % MT LIQD
1.0000 | OROMUCOSAL | Status: DC | PRN
  Filled 2017-09-28: qty 177

## 2017-09-28 MED ORDER — DIATRIZOATE MEGLUMINE & SODIUM 66-10 % PO SOLN
90.0000 mL | Freq: Once | ORAL | Status: AC
  Administered 2017-09-28: 90 mL via NASOGASTRIC
  Filled 2017-09-28 (×2): qty 90

## 2017-09-28 NOTE — ED Notes (Signed)
Admitting Team at the bedside 

## 2017-09-28 NOTE — ED Triage Notes (Addendum)
Pt hade a exploratory camera procedure to monitor her esophagus on Monday. Pt states yesterday began having left sided abdominal pain. Pt states last BM was yesterday and it was a small bowel movement. Pt states the abdominal pain is severe. Has nausea with emesis last night. Took 4mg  of zofran with some relief. Has had her appendix and gall bladder removed. Hx of IBS, no hx of kidney stones, stomach ulcers or bowel obstructions. No diarrhea. No urinary symptoms. Pain does slightly radiate to left back.

## 2017-09-28 NOTE — Progress Notes (Signed)
Dr. Johney Maine aware via phone pt's NGT resistant to flush through white port. MD instruction to pull back stating "it may be kinked". Pulled back 10 cm and white port flushed well w/ sterile water returning to cannister at low suction. Pt said NGT felt weird with no pain noted. Portable abd xray ordered to check placement.

## 2017-09-28 NOTE — Consult Note (Signed)
Reason for Consult:psbo Referring Physician: Irena Cords PA-C  Rebekah [name] is an 56 y.o. female.  HPI: 56 year old obese female with GERD, hypertension, hyperlipidemia, obstructive sleep apnea and history of laparoscopic adjustable gastric band placement in 2008 by Dr. Hassell Done comes in with acute onset of left-sided abdominal pain starting yesterday.  She had nausea but no vomiting.  She had subjective fever and chills but no documented temperature.  She states her last bowel movement was yesterday along with some flatus.  She denies any prior similar events.  She has also had a laparoscopic cholecystectomy as well as an open appendectomy many years ago.  For the past year or so she has had almost daily regurgitation of solid foods.  She did not alert our clinic.  She ended up seeing gastroenterology where she underwent an upper endoscopy in mid November which showed a dilated esophagus along with some mucosal changes and what appeared to be a tight lap band.  She was seen in our clinic in urgent office on November 21 where all fluid was removed from her band which was 4 cc.  She states the regurgitation stopped but she still had some ongoing reflux.  She continued to take her reflux medication.  She underwent manometry on December 3 and was doing well until an acute change yesterday afternoon.  Past Medical History:  Diagnosis Date  . Allergy   . Depression   . GERD (gastroesophageal reflux disease)   . Hyperlipidemia   . Hypertension   . IBS (irritable bowel syndrome)   . Obesity    Lap band surgery 12-08 Dr Hassell Done  . OSA (obstructive sleep apnea)    using a CPAP      Past Surgical History:  Procedure Laterality Date  . APPENDECTOMY    . BREAST REDUCTION SURGERY    . CHOLECYSTECTOMY    . CLOSED REDUCTION FINGER WITH PERCUTANEOUS PINNING Left 09/14/2014   Procedure: CLOSED REDUCTION PERCUTANEOUS PINNING ;  Surgeon: Leanora Cover, MD;  Location: [facility];  Service:  Orthopedics;  Laterality: Left;  . ESOPHAGEAL MANOMETRY N/A 09/23/2017   Procedure: ESOPHAGEAL MANOMETRY (EM);  Surgeon: Mauri Pole, MD;  Location: [facility];  Service: Endoscopy;  Laterality: N/A;  . Lap Band Adjustment  09/01/2015   Dr. Redmond Pulling  . LAPAROSCOPIC GASTRIC BANDING  09/2007   Lap band surgery 12-08 Dr Hassell Done  . NASAL SINUS SURGERY    . TONSILLECTOMY     x2    Family History  Problem Relation Age of Onset  . Coronary artery disease Father        CABG, smoker .age onset 62s  . Prostate cancer Father   . Pulmonary fibrosis Father        and others  . Breast cancer Mother        age 6  . Heart disease Mother        CABG, smoker .age onset 29s  . Diverticulitis Mother   . Heart attack Brother        age 60  . Diabetes Neg Hx   . Colon cancer Neg Hx   . Stomach cancer Neg Hx   . Esophageal cancer Neg Hx   . Rectal cancer Neg Hx     Social History:  reports that  has never smoked. she has never used smokeless tobacco. She reports that she drinks alcohol. She reports that she does not use drugs.  Allergies:  Allergies  Allergen Reactions  . Penicillins Hives and  Rash    Has patient had a PCN reaction causing immediate rash, facial/tongue/throat swelling, SOB or lightheadedness with hypotension: Yes Has patient had a PCN reaction causing severe rash involving mucus membranes or skin necrosis: Yes Has patient had a PCN reaction that required hospitalization: No Has patient had a PCN reaction occurring within the last 10 years: No If all of the above answers are "NO", then may proceed with Cephalosporin use.   . Erythromycin Nausea Only    Gastrointestinal issues.    Medications: I have reviewed the patient's current medications.  Results for orders placed or performed during the hospital encounter of 09/28/17 (from the past 48 hour(s))  CBC     Status: Abnormal   Collection Time: 09/28/17  8:18 AM  Result Value Ref Range   WBC 18.2 (H) 4.0 - 10.5  K/uL   RBC 5.42 (H) 3.87 - 5.11 MIL/uL   Hemoglobin 15.3 (H) 12.0 - 15.0 g/dL   HCT 45.1 36.0 - 46.0 %   MCV 83.2 78.0 - 100.0 fL   MCH 28.2 26.0 - 34.0 pg   MCHC 33.9 30.0 - 36.0 g/dL   RDW 16.8 (H) 11.5 - 15.5 %   Platelets 365 150 - 400 K/uL  Comprehensive metabolic panel     Status: Abnormal   Collection Time: 09/28/17 10:03 AM  Result Value Ref Range   Sodium 132 (L) 135 - 145 mmol/L   Potassium 4.2 3.5 - 5.1 mmol/L   Chloride 97 (L) 101 - 111 mmol/L   CO2 24 22 - 32 mmol/L   Glucose, Bld 126 (H) 65 - 99 mg/dL   BUN 13 6 - 20 mg/dL   Creatinine, Ser 0.77 0.44 - 1.00 mg/dL   Calcium 9.9 8.9 - 10.3 mg/dL   Total Protein 8.0 6.5 - 8.1 g/dL   Albumin 4.1 3.5 - 5.0 g/dL   AST 36 15 - 41 U/L   ALT 32 14 - 54 U/L   Alkaline Phosphatase 88 38 - 126 U/L   Total Bilirubin 0.3 0.3 - 1.2 mg/dL   GFR calc non Af Amer >60 >60 mL/min   GFR calc Af Amer >60 >60 mL/min    Comment: (NOTE) The eGFR has been calculated using the CKD EPI equation. This calculation has not been validated in all clinical situations. eGFR's persistently <60 mL/min signify possible Chronic Kidney Disease.    Anion gap 11 5 - 15  Lipase, blood     Status: None   Collection Time: 09/28/17 10:03 AM  Result Value Ref Range   Lipase 30 11 - 51 U/L  I-Stat beta hCG blood, ED     Status: None   Collection Time: 09/28/17 10:07 AM  Result Value Ref Range   I-stat hCG, quantitative <5.0 <5 mIU/mL   Comment 3            Comment:   GEST. AGE      CONC.  (mIU/mL)   <=1 WEEK        5 - 50     2 WEEKS       50 - 500     3 WEEKS       100 - 10,000     4 WEEKS     1,000 - 30,000        FEMALE AND NON-PREGNANT FEMALE:     LESS THAN 5 mIU/mL   Urinalysis, Routine w reflex microscopic     Status: None   Collection Time: 09/28/17 12:49  PM  Result Value Ref Range   Color, Urine YELLOW YELLOW   APPearance CLEAR CLEAR   Specific Gravity, Urine 1.025 1.005 - 1.030   pH 6.0 5.0 - 8.0   Glucose, UA NEGATIVE NEGATIVE mg/dL    Hgb urine dipstick NEGATIVE NEGATIVE   Bilirubin Urine NEGATIVE NEGATIVE   Ketones, ur NEGATIVE NEGATIVE mg/dL   Protein, ur NEGATIVE NEGATIVE mg/dL   Nitrite NEGATIVE NEGATIVE   Leukocytes, UA NEGATIVE NEGATIVE    Ct Abdomen Pelvis W Contrast  Result Date: 09/28/2017 CLINICAL DATA:  Left side abdominal pain with nausea and vomiting since yesterday. EXAM: CT ABDOMEN AND PELVIS WITH CONTRAST TECHNIQUE: Multidetector CT imaging of the abdomen and pelvis was performed using the standard protocol following bolus administration of intravenous contrast. CONTRAST:  100 ml ISOVUE-300 IOPAMIDOL (ISOVUE-300) INJECTION 61% COMPARISON:  CT abdomen and pelvis 06/21/2011. FINDINGS: Lower chest: Mild dependent atelectasis is seen in the lung bases. No pleural or pericardial effusion. Heart size is normal. Hepatobiliary: There is diffuse fatty infiltration of the liver without focal lesion. The patient is status post cholecystectomy. Biliary tree is unremarkable. Pancreas: Unremarkable. No pancreatic ductal dilatation or surrounding inflammatory changes. Spleen: Normal in size without focal abnormality. Adrenals/Urinary Tract: Left adrenal myelolipoma is noted. The right adrenal gland appears normal. The kidneys, ureters and urinary bladder appear normal. Stomach/Bowel: Lap band is in place. The stomach and proximal small bowel loops are distended. Small bowel loops measure up to 4 cm in diameter with air-fluid levels present. Transition point is difficult to localize but appears to be just to the left of midline adjacent to inflation tube for the patient's lap band where multiple loops are adhesed together as seen on image [id number] of series 3. The colon is largely decompressed but otherwise unremarkable. The patient is status post appendectomy. Vascular/Lymphatic: Aortic atherosclerosis. No enlarged abdominal or pelvic lymph nodes. Reproductive: Uterus and bilateral adnexa are unremarkable. Other: No ascites or free  air. Musculoskeletal: Superior endplate compression fracture of L1 is new since the prior CT scan but appears remote. Lumbar spondylosis appears worst at L2-3. No lytic or sclerotic lesion. IMPRESSION: The examination is positive for small bowel obstruction. Transition point is likely in the mid abdomen where multiple loops appear adhesed together adjacent to tubing for the patient's lap band. No evidence of bowel ischemia. Fatty infiltration of the liver. Atherosclerosis. Mild L1 superior endplate compression fracture is new since 2012 but appears remote. Electronically Signed   By: Inge Rise M.D.   On: 09/28/2017 12:29   Dg Abd Portable 1v  Result Date: 09/28/2017 CLINICAL DATA:  Encounter for nasogastric tube placement. EXAM: PORTABLE ABDOMEN - 1 VIEW COMPARISON:  CT, 09/28/2017 at 11:37 a.m. FINDINGS: The tip of the nasogastric tube projects in the upper abdomen, but lies above the lap band, consistent with the tip being in the distal esophagus above the GE junction. Dilated loops small bowel are noted consistent with a partial small bowel obstruction. IMPRESSION: 1. Tip of the nasal/orogastric tube lies in the distal esophagus just above the lap band. This will be the further inserted, approximately 15 cm. Electronically Signed   By: Lajean Manes M.D.   On: 09/28/2017 15:07    Review of Systems  Constitutional: Negative for weight loss.  HENT: Negative for nosebleeds.   Eyes: Negative for blurred vision.  Respiratory: Negative for shortness of breath.   Cardiovascular: Negative for chest pain, palpitations, orthopnea and PND.       Denies DOE  Gastrointestinal: Positive  for abdominal pain, heartburn and nausea. Negative for constipation, diarrhea and vomiting.  Genitourinary: Negative for dysuria and hematuria.  Musculoskeletal: Negative.   Skin: Negative for itching and rash.  Neurological: Negative for dizziness, focal weakness, seizures, loss of consciousness and headaches.        Denies TIAs, amaurosis fugax  Endo/Heme/Allergies: Does not bruise/bleed easily.  Psychiatric/Behavioral: The patient is not nervous/anxious.    Blood pressure 140/83, pulse 93, temperature 98.3 F (36.8 C), temperature source Oral, resp. rate 14, height '5\' 4"'  (1.626 m), weight 86.2 kg (190 lb), SpO2 98 %. Physical Exam  Vitals reviewed. Constitutional: She is oriented to person, place, and time. She appears well-developed and well-nourished. No distress.  obese  HENT:  Head: Normocephalic and atraumatic.  Right Ear: External ear normal.  Left Ear: External ear normal.  Eyes: Conjunctivae are normal. No scleral icterus.  Neck: Normal range of motion. Neck supple. No tracheal deviation present. No thyromegaly present.  Cardiovascular: Normal rate and normal heart sounds.  Respiratory: Effort normal and breath sounds normal. No stridor. No respiratory distress. She has no wheezes.  GI: Soft. There is tenderness. There is no rebound and no guarding.    Well healed trocar scars; palpable port in right mid abd; some distension, mostly tender on L; no guarding/rebound/peritonitis  Musculoskeletal: She exhibits no edema or tenderness.  Lymphadenopathy:    She has no cervical adenopathy.  Neurological: She is alert and oriented to person, place, and time. She exhibits normal muscle tone.  Skin: Skin is warm and dry. No rash noted. She is not diaphoretic. No erythema. No pallor.  Psychiatric: She has a normal mood and affect. Her behavior is normal. Judgment and thought content normal.    Assessment/Plan: psbo H/o LAGB (AP-Std) Dr Hassell Done 2008 GERD OSA on cpap HTN Morbid obesity  While she does have an elevated white blood cell count she is nontoxic appearing.  She is not tachycardic.  There is no bowel wall thickening or portal venous gas.  Her abdominal exam is reassuring.  Therefore I do not believe she needs urgent surgical evaluation.  Recommended starting with nonoperative  management with bowel rest along with NG tube decompression.  We will start of small bowel obstruction protocol  I reviewed her CT imaging.  There is some concern of possibility of adhesions between her small bowel loops & her adjustable gastric band tubing.  A bowel obstruction due to adjustable gastric band tubing would be very atypical but not impossible.  Nonetheless I would start with nonoperative management and see how she progresses.  If does not improve will need diagnostic laparoscopy  I recommended that she be admitted to [facility] long hospital which is our bariatric hospital as well as the fact that her surgeon is the hospital surgeon there next week  Discussed with her and her husband who are in agreement with the plan.  Also discussed with [facility] hospitalist  Leighton Ruff. Redmond Pulling, MD, FACS General, Bariatric, & Minimally Invasive Surgery [facility]   Greer Pickerel 09/28/2017, 3:31 PM

## 2017-09-28 NOTE — ED Provider Notes (Signed)
[facility] EMERGENCY DEPARTMENT Provider Note   CSN: [id number] Arrival date & time: 09/28/17  2440     History   Chief Complaint Chief Complaint  Patient presents with  . Abdominal Pain  . Post-op Problem    HPI Rebekah [name] is a 56 y.o. female.  HPI Patient presents to the emergency department with increasing abdominal pain since yesterday morning.  The patient states that she started having abdominal pain yesterday morning nausea vomiting.  Patient states she has been dry heaving this morning.  She states that nothing seemed to make the condition better but palpation and certain movements but the pain worse.  Patient states she took Zofran at home with some relief of her symptoms.  The patient states that she had a lap band done in 2008.  She states the week of Thanksgiving she had her lap band deflated.  The patient denies chest pain, shortness of breath, headache,blurred vision, neck pain, fever, cough, weakness, numbness, dizziness, anorexia, edema,diarrhea, rash, back pain, dysuria, hematemesis, bloody stool, near syncope, or syncope. Past Medical History:  Diagnosis Date  . Allergy   . Depression   . GERD (gastroesophageal reflux disease)   . Hyperlipidemia   . Hypertension   . IBS (irritable bowel syndrome)   . Obesity    Lap band surgery 12-08 Dr Hassell Done  . OSA (obstructive sleep apnea)    using a CPAP      Patient Active Problem List   Diagnosis Date Noted  . Dysphagia   . PCP NOTES >>>> 09/25/2015  . Annual physical exam 09/05/2011  . GERD 10/12/2009  . ENDOGENOUS OBESITY 09/22/2007  . HYPERLIPIDEMIA 09/22/2007  . Anxiety and depression 09/22/2007  . HYPERTENSION 09/22/2007  . Seasonal and perennial allergic rhinitis 09/22/2007  . Obstructive sleep apnea 09/22/2007  . REDUCTION MAMMOPLASTY, HX OF 09/22/2007    Past Surgical History:  Procedure Laterality Date  . APPENDECTOMY    . BREAST REDUCTION SURGERY    . CHOLECYSTECTOMY    .  CLOSED REDUCTION FINGER WITH PERCUTANEOUS PINNING Left 09/14/2014   Procedure: CLOSED REDUCTION PERCUTANEOUS PINNING ;  Surgeon: Leanora Cover, MD;  Location: [place];  Service: Orthopedics;  Laterality: Left;  . ESOPHAGEAL MANOMETRY N/A 09/23/2017   Procedure: ESOPHAGEAL MANOMETRY (EM);  Surgeon: Mauri Pole, MD;  Location: [facility];  Service: Endoscopy;  Laterality: N/A;  . Lap Band Adjustment  09/01/2015   Dr. Redmond Pulling  . LAPAROSCOPIC GASTRIC BANDING  09/2007   Lap band surgery 12-08 Dr Hassell Done  . NASAL SINUS SURGERY    . TONSILLECTOMY     x2    OB History    No data available       Home Medications    Prior to Admission medications   Medication Sig Start Date End Date Taking? Authorizing Provider  aspirin 81 MG tablet Take 1 tablet (81 mg total) by mouth daily. 09/04/13  Yes Paz, Alda Berthold, MD  Azelaic Acid (FINACEA) 15 % FOAM Apply 1 application topically 2 (two) times daily as needed.  05/18/16  Yes [provider]  DIVIGEL 1 MG/GM GEL Take 1 mg by mouth daily.  08/30/16  Yes [provider]  L-Methylfolate-Algae (DEPLIN 7.5) 7.5-90.314 MG CAPS Take 1 capsule by mouth daily.    Yes [provider]  lisinopril (PRINIVIL,ZESTRIL) 10 MG tablet Take 1 tablet (10 mg total) daily by mouth. 08/27/17  Yes Colon Branch, MD  Multiple Vitamin (MULTIVITAMIN) tablet Take 1 tablet  by mouth daily. 09/04/13  Yes Paz, Alda Berthold, MD  nortriptyline (PAMELOR) 75 MG capsule Take 1 capsule (75 mg total) by mouth daily. 09/27/15  Yes Paz, Alda Berthold, MD  Omega-3 Fatty Acids (FISH OIL PO) Take 2 tablets by mouth daily. chewables   Yes [provider]  omeprazole (PRILOSEC) 40 MG capsule Take 1 capsule (40 mg total) by mouth daily. 07/05/17  Yes Colon Branch, MD  Probiotic Product (PROBIOTIC-10) CHEW Chew 1 tablet by mouth daily.    Yes [provider]  progesterone (PROMETRIUM) 100 MG capsule Take 100 mg by mouth daily. 08/30/16  Yes [provider]  ranitidine (ZANTAC) 150 MG tablet Take 150 mg by mouth 2 (two) times daily.   Yes [provider]  rosuvastatin (CRESTOR) 10 MG tablet Take 1 tablet (10 mg total) by mouth at bedtime. 07/05/17  Yes Colon Branch, MD    Family History Family History  Problem Relation Age of Onset  . Coronary artery disease Father        CABG, smoker .age onset 45s  . Prostate cancer Father   . Pulmonary fibrosis Father        and others  . Breast cancer Mother        age 75  . Heart disease Mother        CABG, smoker .age onset 71s  . Diverticulitis Mother   . Heart attack Brother        age 50  . Diabetes Neg Hx   . Colon cancer Neg Hx   . Stomach cancer Neg Hx   . Esophageal cancer Neg Hx   . Rectal cancer Neg Hx     Social History Social History   Tobacco Use  . Smoking status: Never Smoker  . Smokeless tobacco: Never Used  Substance Use Topics  . Alcohol use: Yes    Comment: socially   . Drug use: No     Allergies   Penicillins and Erythromycin   Review of Systems Review of Systems All other systems negative except as documented in the HPI. All pertinent positives and negatives as reviewed in the HPI.  Physical Exam Updated Vital Signs BP (!) 152/90 (BP Location: Right Arm)   Pulse 94   Temp 98.3 F (36.8 C) (Oral)   Resp 14   Ht 5\' 4"  (1.626 m)   Wt 86.2 kg (190 lb)   SpO2 95%   BMI 32.61 kg/m   Physical Exam  Constitutional: She is oriented to person, place, and time. She appears well-developed and well-nourished. No distress.  HENT:  Head: Normocephalic and atraumatic.  Mouth/Throat: Oropharynx is clear and moist.  Eyes: Pupils are equal, round, and reactive to light.  Neck: Normal range of motion. Neck supple.  Cardiovascular: Normal rate, regular rhythm and normal heart sounds. Exam reveals no gallop and no friction rub.  No murmur heard. Pulmonary/Chest: Effort normal and breath sounds normal. No respiratory distress. She has no  wheezes.  Abdominal: Soft. Bowel sounds are normal. She exhibits no distension. There is no hepatosplenomegaly. There is tenderness in the epigastric area, left upper quadrant and left lower quadrant. There is no rigidity, no rebound and no guarding.  Neurological: She is alert and oriented to person, place, and time. She exhibits normal muscle tone. Coordination normal.  Skin: Skin is warm and dry. Capillary refill takes less than 2 seconds. No rash noted. No erythema.  Psychiatric: She has a normal mood and affect. Her behavior  is normal.  Nursing note and vitals reviewed.    ED Treatments / Results  Labs (all labs ordered are listed, but only abnormal results are displayed) Labs Reviewed  CBC - Abnormal; Notable for the following components:      Result Value   WBC 18.2 (*)    RBC 5.42 (*)    Hemoglobin 15.3 (*)    RDW 16.8 (*)    All other components within normal limits  COMPREHENSIVE METABOLIC PANEL - Abnormal; Notable for the following components:   Sodium 132 (*)    Chloride 97 (*)    Glucose, Bld 126 (*)    All other components within normal limits  URINALYSIS, ROUTINE W REFLEX MICROSCOPIC  LIPASE, BLOOD  I-STAT BETA HCG BLOOD, ED ([facility], [facility], [facility] ONLY)    EKG  EKG Interpretation None       Radiology Ct Abdomen Pelvis W Contrast  Result Date: 09/28/2017 CLINICAL DATA:  Left side abdominal pain with nausea and vomiting since yesterday. EXAM: CT ABDOMEN AND PELVIS WITH CONTRAST TECHNIQUE: Multidetector CT imaging of the abdomen and pelvis was performed using the standard protocol following bolus administration of intravenous contrast. CONTRAST:  100 ml ISOVUE-300 IOPAMIDOL (ISOVUE-300) INJECTION 61% COMPARISON:  CT abdomen and pelvis 06/21/2011. FINDINGS: Lower chest: Mild dependent atelectasis is seen in the lung bases. No pleural or pericardial effusion. Heart size is normal. Hepatobiliary: There is diffuse fatty infiltration of the liver without focal lesion. The patient  is status post cholecystectomy. Biliary tree is unremarkable. Pancreas: Unremarkable. No pancreatic ductal dilatation or surrounding inflammatory changes. Spleen: Normal in size without focal abnormality. Adrenals/Urinary Tract: Left adrenal myelolipoma is noted. The right adrenal gland appears normal. The kidneys, ureters and urinary bladder appear normal. Stomach/Bowel: Lap band is in place. The stomach and proximal small bowel loops are distended. Small bowel loops measure up to 4 cm in diameter with air-fluid levels present. Transition point is difficult to localize but appears to be just to the left of midline adjacent to inflation tube for the patient's lap band where multiple loops are adhesed together as seen on image 45-55 of series 3. The colon is largely decompressed but otherwise unremarkable. The patient is status post appendectomy. Vascular/Lymphatic: Aortic atherosclerosis. No enlarged abdominal or pelvic lymph nodes. Reproductive: Uterus and bilateral adnexa are unremarkable. Other: No ascites or free air. Musculoskeletal: Superior endplate compression fracture of L1 is new since the prior CT scan but appears remote. Lumbar spondylosis appears worst at L2-3. No lytic or sclerotic lesion. IMPRESSION: The examination is positive for small bowel obstruction. Transition point is likely in the mid abdomen where multiple loops appear adhesed together adjacent to tubing for the patient's lap band. No evidence of bowel ischemia. Fatty infiltration of the liver. Atherosclerosis. Mild L1 superior endplate compression fracture is new since 2012 but appears remote. Electronically Signed   By: Inge Rise M.D.   On: 09/28/2017 12:29    Procedures Procedures (including critical care time)  Medications Ordered in ED Medications  sodium chloride 0.9 % bolus 1,000 mL (0 mLs Intravenous Stopped 09/28/17 1308)  morphine 4 MG/ML injection 4 mg (4 mg Intravenous Given 09/28/17 1034)  ondansetron (ZOFRAN)  injection 4 mg (4 mg Intravenous Given 09/28/17 1048)  iopamidol (ISOVUE-300) 61 % injection (100 mLs  Contrast Given 09/28/17 1125)     Initial Impression / Assessment and Plan / ED Course  I have reviewed the triage vital signs and the nursing notes.  Pertinent labs & imaging results that  were available during my care of the patient were reviewed by me and considered in my medical decision making (see chart for details).     Patient will need to be admitted to the hospital for small bowel obstruction possibly involving portions of the lap band.  I spoke with the general surgeon who will come evaluate the patient.  Patient is been stable here in the emergency department did give IV fluids and pain medication for symptom control.  Final Clinical Impressions(s) / ED Diagnoses   Final diagnoses:  SBO (small bowel obstruction) [facility])    ED Discharge Orders    None       Dalia Heading, PA-C 09/28/17 1503    Tanna Furry, MD 10/03/17 0010

## 2017-09-28 NOTE — H&P (Signed)
History and Physical    Rebekah [name] DIY:[id number] DOB: [birth year] DOA: 09/28/2017  PCP: Colon Branch, MD   Patient coming from: Home  I have personally briefly reviewed patient's old medical records in [place]  Chief Complaint: Abdominal pain  HPI: Rebekah [name] is a 56 y.o. female with medical history significant of hypertension, hyperlipidemia, depression, GERD, obesity status post lap band surgery 10 years ago with recent esophageal manometry done by GI 5 days ago presented with sudden onset of abdominal pain which started yesterday.  Patient states that she suddenly started having abdominal pain yesterday morning along with nausea and dry heaves.  She states that her abdominal pain is sharp in nature up to 8 out of 10 in intensity, with no radiation and exacerbated by palpation and relieved by nothing.  Her last bowel movement was yesterday.  No fever, chest pain, shortness of breath, dysuria, hematuria or increased frequency of urination.  Patient stated that her lap band was deflated during the week of Thanksgiving.  ED Course: She was found to have bowel obstruction.  General surgery was notified who recommended NG tube placement and patient be admitted under hospitalist service.  Hospitalist service was called.  Review of Systems: As per HPI otherwise 10 point review of systems negative.   Past Medical History:  Diagnosis Date  . Allergy   . Depression   . GERD (gastroesophageal reflux disease)   . Hyperlipidemia   . Hypertension   . IBS (irritable bowel syndrome)   . Obesity    Lap band surgery 12-08 Dr Hassell Done  . OSA (obstructive sleep apnea)    using a CPAP      Past Surgical History:  Procedure Laterality Date  . APPENDECTOMY    . BREAST REDUCTION SURGERY    . CHOLECYSTECTOMY    . CLOSED REDUCTION FINGER WITH PERCUTANEOUS PINNING Left 09/14/2014   Procedure: CLOSED REDUCTION PERCUTANEOUS PINNING ;  Surgeon: Leanora Cover, MD;  Location: [place];  Service: Orthopedics;  Laterality: Left;  . ESOPHAGEAL MANOMETRY N/A 09/23/2017   Procedure: ESOPHAGEAL MANOMETRY (EM);  Surgeon: Mauri Pole, MD;  Location[facility];  Service: Endoscopy;  Laterality: N/A;  . Lap Band Adjustment  09/01/2015   Dr. Redmond Pulling  . LAPAROSCOPIC GASTRIC BANDING  09/2007   Lap band surgery 12-08 Dr Hassell Done  . NASAL SINUS SURGERY    . TONSILLECTOMY     x2   Social history  reports that  has never smoked. she has never used smokeless tobacco. She reports that she drinks alcohol. She reports that she does not use drugs.  Lives at home with husband  Allergies  Allergen Reactions  . Penicillins Hives and Rash    Has patient had a PCN reaction causing immediate rash, facial/tongue/throat swelling, SOB or lightheadedness with hypotension: Yes Has patient had a PCN reaction causing severe rash involving mucus membranes or skin necrosis: Yes Has patient had a PCN reaction that required hospitalization: No Has patient had a PCN reaction occurring within the last 10 years: No If all of the above answers are "NO", then may proceed with Cephalosporin use.   . Erythromycin Nausea Only    Gastrointestinal issues.    Family History  Problem Relation Age of Onset  . Coronary artery disease Father        CABG, smoker .age onset 65s  . Prostate cancer Father   . Pulmonary fibrosis Father        and others  .  Breast cancer Mother        age 40  . Heart disease Mother        CABG, smoker .age onset 69s  . Diverticulitis Mother   . Heart attack Brother        age 60  . Diabetes Neg Hx   . Colon cancer Neg Hx   . Stomach cancer Neg Hx   . Esophageal cancer Neg Hx   . Rectal cancer Neg Hx     Prior to Admission medications   Medication Sig Start Date End Date Taking? Authorizing Provider  aspirin 81 MG tablet Take 1 tablet (81 mg total) by mouth daily. 09/04/13  Yes Paz, Alda Berthold, MD  Azelaic Acid (FINACEA) 15 % FOAM Apply 1 application topically 2  (two) times daily as needed.  05/18/16  Yes [provider]  DIVIGEL 1 MG/GM GEL Take 1 mg by mouth daily.  08/30/16  Yes [provider]  L-Methylfolate-Algae (DEPLIN 7.5) 7.5-90.314 MG CAPS Take 1 capsule by mouth daily.    Yes [provider]  lisinopril (PRINIVIL,ZESTRIL) 10 MG tablet Take 1 tablet (10 mg total) daily by mouth. 08/27/17  Yes Paz, Alda Berthold, MD  Multiple Vitamin (MULTIVITAMIN) tablet Take 1 tablet by mouth daily. 09/04/13  Yes Paz, Alda Berthold, MD  nortriptyline (PAMELOR) 75 MG capsule Take 1 capsule (75 mg total) by mouth daily. 09/27/15  Yes Paz, Alda Berthold, MD  Omega-3 Fatty Acids (FISH OIL PO) Take 2 tablets by mouth daily. chewables   Yes [provider]  omeprazole (PRILOSEC) 40 MG capsule Take 1 capsule (40 mg total) by mouth daily. 07/05/17  Yes Colon Branch, MD  Probiotic Product (PROBIOTIC-10) CHEW Chew 1 tablet by mouth daily.    Yes [provider]  progesterone (PROMETRIUM) 100 MG capsule Take 100 mg by mouth daily. 08/30/16  Yes [provider]  ranitidine (ZANTAC) 150 MG tablet Take 150 mg by mouth 2 (two) times daily.   Yes [provider]  rosuvastatin (CRESTOR) 10 MG tablet Take 1 tablet (10 mg total) by mouth at bedtime. 07/05/17  Yes Colon Branch, MD    Physical Exam: Vitals:   09/28/17 1400 09/28/17 1415 09/28/17 1445 09/28/17 1500  BP: (!) 141/85 (!) 152/90 134/73 140/83  Pulse: 99 94 93 93  Resp:  14  14  Temp:      TempSrc:      SpO2: 97% 95% 98% 98%  Weight:      Height:        Constitutional: NAD, calm, comfortable Vitals:   09/28/17 1400 09/28/17 1415 09/28/17 1445 09/28/17 1500  BP: (!) 141/85 (!) 152/90 134/73 140/83  Pulse: 99 94 93 93  Resp:  14  14  Temp:      TempSrc:      SpO2: 97% 95% 98% 98%  Weight:      Height:       Eyes: PERRL, lids and conjunctivae normal ENMT: Mucous membranes are dry. Posterior pharynx clear of any exudate or lesions. Neck: normal, supple, no masses, no  thyromegaly Respiratory: Bilateral decreased breath sounds at bases with no crackles.  No tachypnea  cardiovascular: S1-S2 positive, rate controlled.  No murmurs  abdomen: Mild periumbilical tenderness, no rebound tenderness, soft with no organomegaly.  Bowel sounds sluggish musculoskeletal: no clubbing / cyanosis. No joint deformity upper and lower extremities.  Skin: no rashes, lesions, ulcers. No induration Neurologic: CN 2-12 grossly intact. moving extremities; no focal neurologic deficit  psychiatric: Normal judgment and insight. Alert and oriented x 3. Normal mood.   Labs on Admission: I have personally reviewed following labs and imaging studies  CBC: Recent Labs  Lab 09/28/17 0818  WBC 18.2*  HGB 15.3*  HCT 45.1  MCV 83.2  PLT 637   Basic Metabolic Panel: Recent Labs  Lab 09/28/17 1003  NA 132*  K 4.2  CL 97*  CO2 24  GLUCOSE 126*  BUN 13  CREATININE 0.77  CALCIUM 9.9   GFR: Estimated Creatinine Clearance: 84.4 mL/min (by C-G formula based on SCr of 0.77 mg/dL). Liver Function Tests: Recent Labs  Lab 09/28/17 1003  AST 36  ALT 32  ALKPHOS 88  BILITOT 0.3  PROT 8.0  ALBUMIN 4.1   Recent Labs  Lab 09/28/17 1003  LIPASE 30   No results for input(s): AMMONIA in the last 168 hours. Coagulation Profile: No results for input(s): INR, PROTIME in the last 168 hours. Cardiac Enzymes: No results for input(s): CKTOTAL, CKMB, CKMBINDEX, TROPONINI in the last 168 hours. BNP (last 3 results) No results for input(s): PROBNP in the last 8760 hours. HbA1C: No results for input(s): HGBA1C in the last 72 hours. CBG: No results for input(s): GLUCAP in the last 168 hours. Lipid Profile: No results for input(s): CHOL, HDL, LDLCALC, TRIG, CHOLHDL, LDLDIRECT in the last 72 hours. Thyroid Function Tests: No results for input(s): TSH, T4TOTAL, FREET4, T3FREE, THYROIDAB in the last 72 hours. Anemia Panel: No results for input(s): VITAMINB12, FOLATE, FERRITIN, TIBC, IRON,  RETICCTPCT in the last 72 hours. Urine analysis:    Component Value Date/Time   COLORURINE YELLOW 09/28/2017 1249   APPEARANCEUR CLEAR 09/28/2017 1249   LABSPEC 1.025 09/28/2017 1249   PHURINE 6.0 09/28/2017 1249   GLUCOSEU NEGATIVE 09/28/2017 1249   HGBUR NEGATIVE 09/28/2017 1249   BILIRUBINUR NEGATIVE 09/28/2017 1249   BILIRUBINUR Neg 07/04/2012 1158   KETONESUR NEGATIVE 09/28/2017 1249   PROTEINUR NEGATIVE 09/28/2017 1249   UROBILINOGEN 0.2 07/04/2012 1158   NITRITE NEGATIVE 09/28/2017 1249   LEUKOCYTESUR NEGATIVE 09/28/2017 1249    Radiological Exams on Admission: Ct Abdomen Pelvis W Contrast  Result Date: 09/28/2017 CLINICAL DATA:  Left side abdominal pain with nausea and vomiting since yesterday. EXAM: CT ABDOMEN AND PELVIS WITH CONTRAST TECHNIQUE: Multidetector CT imaging of the abdomen and pelvis was performed using the standard protocol following bolus administration of intravenous contrast. CONTRAST:  100 ml ISOVUE-300 IOPAMIDOL (ISOVUE-300) INJECTION 61% COMPARISON:  CT abdomen and pelvis 06/21/2011. FINDINGS: Lower chest: Mild dependent atelectasis is seen in the lung bases. No pleural or pericardial effusion. Heart size is normal. Hepatobiliary: There is diffuse fatty infiltration of the liver without focal lesion. The patient is status post cholecystectomy. Biliary tree is unremarkable. Pancreas: Unremarkable. No pancreatic ductal dilatation or surrounding inflammatory changes. Spleen: Normal in size without focal abnormality. Adrenals/Urinary Tract: Left adrenal myelolipoma is noted. The right adrenal gland appears normal. The kidneys, ureters and urinary bladder appear normal. Stomach/Bowel: Lap band is in place. The stomach and proximal small bowel loops are distended. Small bowel loops measure up to 4 cm in diameter with air-fluid levels present. Transition point is difficult to localize but appears to be just to the left of midline adjacent to inflation tube for the patient's  lap band where multiple loops are adhesed together as seen on image 45-55 of series 3. The colon is largely decompressed but otherwise unremarkable. The patient is status post appendectomy. Vascular/Lymphatic: Aortic atherosclerosis. No enlarged abdominal or pelvic lymph nodes.  Reproductive: Uterus and bilateral adnexa are unremarkable. Other: No ascites or free air. Musculoskeletal: Superior endplate compression fracture of L1 is new since the prior CT scan but appears remote. Lumbar spondylosis appears worst at L2-3. No lytic or sclerotic lesion. IMPRESSION: The examination is positive for small bowel obstruction. Transition point is likely in the mid abdomen where multiple loops appear adhesed together adjacent to tubing for the patient's lap band. No evidence of bowel ischemia. Fatty infiltration of the liver. Atherosclerosis. Mild L1 superior endplate compression fracture is new since 2012 but appears remote. Electronically Signed   By: Inge Rise M.D.   On: 09/28/2017 12:29   Dg Abd Portable 1v  Result Date: 09/28/2017 CLINICAL DATA:  Encounter for nasogastric tube placement. EXAM: PORTABLE ABDOMEN - 1 VIEW COMPARISON:  CT, 09/28/2017 at 11:37 a.m. FINDINGS: The tip of the nasogastric tube projects in the upper abdomen, but lies above the lap band, consistent with the tip being in the distal esophagus above the GE junction. Dilated loops small bowel are noted consistent with a partial small bowel obstruction. IMPRESSION: 1. Tip of the nasal/orogastric tube lies in the distal esophagus just above the lap band. This will be the further inserted, approximately 15 cm. Electronically Signed   By: Lajean Manes M.D.   On: 09/28/2017 15:07     Assessment/Plan Active Problems:   Hyperlipidemia   Essential hypertension   SBO (small bowel obstruction) (HCC)  Small bowel obstruction -Probably secondary to adhesions.  Spoke to Dr. Jeanice Lim.  He recommends that patient be transferred to  [facility] for further evaluation and follow-up by general surgical team. -Continue NG tube.  N.p.o.  IV fluids.  IV analgesics and antiemetics -We will order small bowel protocol as per Dr. Dois Davenport recommendation  Leukocytosis -Probably reactive.  Repeat a.m. labs.  Monitor off antibiotics  Hypertension -Monitor blood pressure.  Use IV antihypertensives if needed  Hyperlipidemia -Hold statin for now  Depression -Hold home medication for now  DVT prophylaxis: Lovenox Code Status: Full Family Communication: Spoke to husband at bedside Disposition Plan: Depends on clinical outcome Consults called: General surgery Admission status: MedSurg  Severity of Illness: The appropriate patient status for this patient is INPATIENT. Inpatient status is judged to be reasonable and necessary in order to provide the required intensity of service to ensure the patient's safety. The patient's presenting symptoms, physical exam findings, and initial radiographic and laboratory data in the context of their chronic comorbidities is felt to place them at high risk for further clinical deterioration. Furthermore, it is not anticipated that the patient will be medically stable for discharge from the hospital within 2 midnights of admission. The following factors support the patient status of inpatient.   " The patient's presenting symptoms include abdominal pain and nausea. " The worrisome physical exam findings include abdominal tenderness and decreased bowel sounds. " The initial radiographic and laboratory data are worrisome because of small bowel obstruction. " The chronic co-morbidities include hypertension, hyperlipidemia.   * I certify that at the point of admission it is my clinical judgment that the patient will require inpatient hospital care spanning beyond 2 midnights from the point of admission due to high intensity of service, high risk for further deterioration and high frequency of  surveillance required.Aline August MD [facility] Pager [phone]  If 7PM-7AM, please contact night-coverage [email or web address] Password [email or web address]  09/28/2017, 3:15 PM

## 2017-09-28 NOTE — Progress Notes (Signed)
Dr. Johney Maine aware of recent Xray results for NGT adjustment. Instructed to go ahead with instillation of gastrografin. See new orders entered in Epic per MD.

## 2017-09-28 NOTE — ED Notes (Signed)
Pt taken to CT.

## 2017-09-28 NOTE — ED Notes (Signed)
Attempted Iv x2. Utilized [name] unable to access

## 2017-09-29 ENCOUNTER — Inpatient Hospital Stay (HOSPITAL_COMMUNITY): Payer: 59

## 2017-09-29 DIAGNOSIS — Z0189 Encounter for other specified special examinations: Secondary | ICD-10-CM

## 2017-09-29 LAB — BASIC METABOLIC PANEL
ANION GAP: 9 (ref 5–15)
BUN: 12 mg/dL (ref 6–20)
CHLORIDE: 105 mmol/L (ref 101–111)
CO2: 25 mmol/L (ref 22–32)
Calcium: 8.9 mg/dL (ref 8.9–10.3)
Creatinine, Ser: 0.68 mg/dL (ref 0.44–1.00)
Glucose, Bld: 102 mg/dL — ABNORMAL HIGH (ref 65–99)
POTASSIUM: 4.2 mmol/L (ref 3.5–5.1)
SODIUM: 139 mmol/L (ref 135–145)

## 2017-09-29 LAB — CBC
HEMATOCRIT: 37.1 % (ref 36.0–46.0)
HEMOGLOBIN: 12 g/dL (ref 12.0–15.0)
MCH: 27.7 pg (ref 26.0–34.0)
MCHC: 32.3 g/dL (ref 30.0–36.0)
MCV: 85.7 fL (ref 78.0–100.0)
PLATELETS: 358 10*3/uL (ref 150–400)
RBC: 4.33 MIL/uL (ref 3.87–5.11)
RDW: 17.3 % — AB (ref 11.5–15.5)
WBC: 10.7 10*3/uL — AB (ref 4.0–10.5)

## 2017-09-29 NOTE — Progress Notes (Signed)
Patient ID: Rebekah [name], female   DOB: 06-16-1961, 56 y.o.   MRN: [id number]    PROGRESS NOTE    NEFTALI THUROW  RDE:[id number] DOB: 06-18-1961 DOA: 09/28/2017  PCP: Colon Branch, MD   Brief Narrative:  56 y.o. female with medical history significant of hypertension, hyperlipidemia, depression, GERD, obesity status post lap band surgery 10 years ago with recent esophageal manometry done by GI 5 days ago presented with sudden onset of abdominal pain associated with nausea.  Assessment & Plan:   Active Problems:   SBO (small bowel obstruction) ([place]), partial - pt feeling better, NGT removed - diet advanced to clears  - appreciate surgery team following    Leukocytosis - reactive, in the setting of the above - improving - CBC in AM   DVT prophylaxis: Lovenox SQ Code Status: Full Family Communication: Patient at bedside  Disposition Plan: home in 24-48 hours   Consultants:   Surgery  Procedures:   None  Antimicrobials:   None  Subjective: P reports feeling better.  Objective: Vitals:   09/28/17 1615 09/28/17 1710 09/28/17 2058 09/29/17 0500  BP: 127/75 129/89 (!) 149/90 (!) 147/85  Pulse: 96 93 85 81  Resp: 14 16 18 18   Temp:  98 F (36.7 C) 98.4 F (36.9 C) 98.4 F (36.9 C)  TempSrc:  Oral Oral Oral  SpO2: 91% 94% 97% 94%  Weight:      Height:        Intake/Output Summary (Last 24 hours) at 09/29/2017 1105 Last data filed at 09/29/2017 1017 Gross per 24 hour  Intake 2223.33 ml  Output 1450 ml  Net 773.33 ml   Filed Weights   09/28/17 0753 09/28/17 0807  Weight: 86.2 kg (190 lb) 86.2 kg (190 lb)    Examination:  General exam: Appears calm and comfortable  Respiratory system: Clear to auscultation. Respiratory effort normal. Cardiovascular system: S1 & S2 heard, RRR. No JVD, murmurs, rubs, gallops or clicks. No pedal edema. Gastrointestinal system: Abdomen is nondistended, soft and nontender. No organomegaly or masses felt. Normal bowel sounds  heard. Central nervous system: Alert and oriented. No focal neurological deficits. Extremities: Symmetric 5 x 5 power. Skin: No rashes, lesions or ulcers Psychiatry: Judgement and insight appear normal. Mood & affect appropriate.    Data Reviewed: I have personally reviewed following labs and imaging studies  CBC: Recent Labs  Lab 09/28/17 0818 09/29/17 0520  WBC 18.2* 10.7*  HGB 15.3* 12.0  HCT 45.1 37.1  MCV 83.2 85.7  PLT 365 631   Basic Metabolic Panel: Recent Labs  Lab 09/28/17 1003 09/29/17 0520  NA 132* 139  K 4.2 4.2  CL 97* 105  CO2 24 25  GLUCOSE 126* 102*  BUN 13 12  CREATININE 0.77 0.68  CALCIUM 9.9 8.9   Liver Function Tests: Recent Labs  Lab 09/28/17 1003  AST 36  ALT 32  ALKPHOS 88  BILITOT 0.3  PROT 8.0  ALBUMIN 4.1   Recent Labs  Lab 09/28/17 1003  LIPASE 30   Urine analysis:    Component Value Date/Time   COLORURINE YELLOW 09/28/2017 1249   APPEARANCEUR CLEAR 09/28/2017 1249   LABSPEC 1.025 09/28/2017 1249   PHURINE 6.0 09/28/2017 1249   GLUCOSEU NEGATIVE 09/28/2017 1249   HGBUR NEGATIVE 09/28/2017 1249   BILIRUBINUR NEGATIVE 09/28/2017 1249   BILIRUBINUR Neg 07/04/2012 1158   KETONESUR NEGATIVE 09/28/2017 1249   PROTEINUR NEGATIVE 09/28/2017 1249   UROBILINOGEN 0.2 07/04/2012 1158   NITRITE NEGATIVE  09/28/2017 [place] 09/28/2017 1249    Radiology Studies: Ct Abdomen Pelvis W Contrast  Result Date: 09/28/2017 CLINICAL DATA:  Left side abdominal pain with nausea and vomiting since yesterday. EXAM: CT ABDOMEN AND PELVIS WITH CONTRAST TECHNIQUE: Multidetector CT imaging of the abdomen and pelvis was performed using the standard protocol following bolus administration of intravenous contrast. CONTRAST:  100 ml ISOVUE-300 IOPAMIDOL (ISOVUE-300) INJECTION 61% COMPARISON:  CT abdomen and pelvis 06/21/2011. FINDINGS: Lower chest: Mild dependent atelectasis is seen in the lung bases. No pleural or pericardial  effusion. Heart size is normal. Hepatobiliary: There is diffuse fatty infiltration of the liver without focal lesion. The patient is status post cholecystectomy. Biliary tree is unremarkable. Pancreas: Unremarkable. No pancreatic ductal dilatation or surrounding inflammatory changes. Spleen: Normal in size without focal abnormality. Adrenals/Urinary Tract: Left adrenal myelolipoma is noted. The right adrenal gland appears normal. The kidneys, ureters and urinary bladder appear normal. Stomach/Bowel: Lap band is in place. The stomach and proximal small bowel loops are distended. Small bowel loops measure up to 4 cm in diameter with air-fluid levels present. Transition point is difficult to localize but appears to be just to the left of midline adjacent to inflation tube for the patient's lap band where multiple loops are adhesed together as seen on image 45-55 of series 3. The colon is largely decompressed but otherwise unremarkable. The patient is status post appendectomy. Vascular/Lymphatic: Aortic atherosclerosis. No enlarged abdominal or pelvic lymph nodes. Reproductive: Uterus and bilateral adnexa are unremarkable. Other: No ascites or free air. Musculoskeletal: Superior endplate compression fracture of L1 is new since the prior CT scan but appears remote. Lumbar spondylosis appears worst at L2-3. No lytic or sclerotic lesion. IMPRESSION: The examination is positive for small bowel obstruction. Transition point is likely in the mid abdomen where multiple loops appear adhesed together adjacent to tubing for the patient's lap band. No evidence of bowel ischemia. Fatty infiltration of the liver. Atherosclerosis. Mild L1 superior endplate compression fracture is new since 2012 but appears remote. Electronically Signed   By: Inge Rise M.D.   On: 09/28/2017 12:29   Dg Abd Portable 1v-small Bowel Obstruction Protocol-initial, 8 Hr Delay  Result Date: 09/29/2017 CLINICAL DATA:  Small bowel obstruction  protocol. 8 hour postcontrast film. EXAM: PORTABLE ABDOMEN - 1 VIEW COMPARISON:  09/28/2017 FINDINGS: Previously administered contrast has passed largely into the colon, breaching is far as the mid descending colon. Moderate amount of gas does remain visible in the small and large intestine but there are no visible dilated loops on this supine image. Lap band remains evident. Nasogastric tube tip is only at the distal esophagus. IMPRESSION: At 8 hours, contrast has passed as far as the descending colon. This indicates that either the obstruction has resolved or it is low grade partial. Nasogastric tube tip only in the distal esophagus. Electronically Signed   By: Nelson Chimes M.D.   On: 09/29/2017 06:26   Dg Abd Portable 1v  Result Date: 09/28/2017 CLINICAL DATA:  NG tube adjustment. EXAM: PORTABLE ABDOMEN - 1 VIEW COMPARISON:  One-view abdomen at 3:50 p.m. FINDINGS: The NG tube is coiled in the distal esophagus, above the lap band. There is mild gaseous distention of the stomach and small bowel. Lung bases are clear. IMPRESSION: The NG tube is coiled in the distal esophagus, above the lap band. Electronically Signed   By: San Morelle M.D.   On: 09/28/2017 18:54   Dg Abd Portable 1v-small Bowel Protocol-position Verification  Result Date: 09/28/2017 CLINICAL DATA:  confirm nasogastric (NG) tube placement advanced EXAM: PORTABLE ABDOMEN - 1 VIEW COMPARISON:  None. FINDINGS: Nasogastric tube tip not visualized on the included field of view. It does not pass into the stomach. IMPRESSION: Nasogastric tube has not advanced. Tip is above the included field of view, above the stomach. Electronically Signed   By: Lajean Manes M.D.   On: 09/28/2017 16:10   Dg Abd Portable 1v  Result Date: 09/28/2017 CLINICAL DATA:  Encounter for nasogastric tube placement. EXAM: PORTABLE ABDOMEN - 1 VIEW COMPARISON:  CT, 09/28/2017 at 11:37 a.m. FINDINGS: The tip of the nasogastric tube projects in the upper abdomen,  but lies above the lap band, consistent with the tip being in the distal esophagus above the GE junction. Dilated loops small bowel are noted consistent with a partial small bowel obstruction. IMPRESSION: 1. Tip of the nasal/orogastric tube lies in the distal esophagus just above the lap band. This will be the further inserted, approximately 15 cm. Electronically Signed   By: Lajean Manes M.D.   On: 09/28/2017 15:07   Scheduled Meds: . chlorhexidine  15 mL Mouth Rinse BID  . dexamethasone  4 mg Intravenous Q12H  . enoxaparin (LOVENOX) injection  40 mg Subcutaneous Q24H  . estradiol  1 mg Oral Daily  . lip balm  1 application Topical BID  . mouth rinse  15 mL Mouth Rinse q12n4p   Continuous Infusions: . sodium chloride 100 mL/hr at 09/29/17 0515  . chlorproMAZINE (THORAZINE) IV    . lactated ringers    . methocarbamol (ROBAXIN)  IV    . ondansetron (ZOFRAN) IV       LOS: 1 day   Time spent: 25 minutes   Faye Ramsay, MD [facility] Pager [phone]  If 7PM-7AM, please contact night-coverage [email or web address] Password [email or web address] 09/29/2017, 11:05 AM

## 2017-09-29 NOTE — Progress Notes (Signed)
Assessment Partial SBO-contrast from SB protocol in colon already; bowel function present   Plan:  D/C ng tube.  Clear liquids.   LOS: 1 day        Chief Complaint/Subjective: Rebekah [name].  Passing gas, bowels moving.  Wants ng out.  Objective: Vital signs in last 24 hours: Temp:  [98 F (36.7 C)-98.4 F (36.9 C)] 98.4 F (36.9 C) (12/09 0500) Pulse Rate:  [81-99] 81 (12/09 0500) Resp:  [14-18] 18 (12/09 0500) BP: (127-163)/(73-93) 147/85 (12/09 0500) SpO2:  [91 %-99 %] 94 % (12/09 0500) Last BM Date: 09/27/17  Intake/Output from previous day: 12/08 0701 - 12/09 0700 In: 2223.3 [I.V.:2223.3] Out: 1450 [Urine:1450] Intake/Output this shift: No intake/output data recorded.  PE: General- In NAD.  Awake and alert. Abdomen-soft, not tender  Lab Results:  Recent Labs    09/28/17 0818 09/29/17 0520  WBC 18.2* 10.7*  HGB 15.3* 12.0  HCT 45.1 37.1  PLT 365 358   BMET Recent Labs    09/28/17 1003 09/29/17 0520  NA 132* 139  K 4.2 4.2  CL 97* 105  CO2 24 25  GLUCOSE 126* 102*  BUN 13 12  CREATININE 0.77 0.68  CALCIUM 9.9 8.9   PT/INR No results for input(s): LABPROT, INR in the last 72 hours. Comprehensive Metabolic Panel:    Component Value Date/Time   NA 139 09/29/2017 0520   NA 132 (L) 09/28/2017 1003   K 4.2 09/29/2017 0520   K 4.2 09/28/2017 1003   CL 105 09/29/2017 0520   CL 97 (L) 09/28/2017 1003   CO2 25 09/29/2017 0520   CO2 24 09/28/2017 1003   BUN 12 09/29/2017 0520   BUN 13 09/28/2017 1003   CREATININE 0.68 09/29/2017 0520   CREATININE 0.77 09/28/2017 1003   GLUCOSE 102 (H) 09/29/2017 0520   GLUCOSE 126 (H) 09/28/2017 1003   GLUCOSE 95 08/24/2010 1048   GLUCOSE 82 08/30/2006 0904   CALCIUM 8.9 09/29/2017 0520   CALCIUM 9.9 09/28/2017 1003   AST 36 09/28/2017 1003   AST 21 06/05/2017 0840   ALT 32 09/28/2017 1003   ALT 22 06/05/2017 0840   ALKPHOS 88 09/28/2017 1003   ALKPHOS 70 06/05/2017 0840   BILITOT 0.3 09/28/2017  1003   BILITOT 0.3 06/05/2017 0840   PROT 8.0 09/28/2017 1003   PROT 6.7 06/05/2017 0840   ALBUMIN 4.1 09/28/2017 1003   ALBUMIN 4.0 06/05/2017 0840     Studies/Results: Ct Abdomen Pelvis W Contrast  Result Date: 09/28/2017 CLINICAL DATA:  Left side abdominal pain with nausea and vomiting since yesterday. EXAM: CT ABDOMEN AND PELVIS WITH CONTRAST TECHNIQUE: Multidetector CT imaging of the abdomen and pelvis was performed using the standard protocol following bolus administration of intravenous contrast. CONTRAST:  100 ml ISOVUE-300 IOPAMIDOL (ISOVUE-300) INJECTION 61% COMPARISON:  CT abdomen and pelvis 06/21/2011. FINDINGS: Lower chest: Mild dependent atelectasis is seen in the lung bases. No pleural or pericardial effusion. Heart size is normal. Hepatobiliary: There is diffuse fatty infiltration of the liver without focal lesion. The patient is status post cholecystectomy. Biliary tree is unremarkable. Pancreas: Unremarkable. No pancreatic ductal dilatation or surrounding inflammatory changes. Spleen: Normal in size without focal abnormality. Adrenals/Urinary Tract: Left adrenal myelolipoma is noted. The right adrenal gland appears normal. The kidneys, ureters and urinary bladder appear normal. Stomach/Bowel: Lap band is in place. The stomach and proximal small bowel loops are distended. Small bowel loops measure up to 4 cm in diameter with air-fluid levels present.  Transition point is difficult to localize but appears to be just to the left of midline adjacent to inflation tube for the patient's lap band where multiple loops are adhesed together as seen on image 45-55 of series 3. The colon is largely decompressed but otherwise unremarkable. The patient is status post appendectomy. Vascular/Lymphatic: Aortic atherosclerosis. No enlarged abdominal or pelvic lymph nodes. Reproductive: Uterus and bilateral adnexa are unremarkable. Other: No ascites or free air. Musculoskeletal: Superior endplate  compression fracture of L1 is new since the prior CT scan but appears remote. Lumbar spondylosis appears worst at L2-3. No lytic or sclerotic lesion. IMPRESSION: The examination is positive for small bowel obstruction. Transition point is likely in the mid abdomen where multiple loops appear adhesed together adjacent to tubing for the patient's lap band. No evidence of bowel ischemia. Fatty infiltration of the liver. Atherosclerosis. Mild L1 superior endplate compression fracture is new since 2012 but appears remote. Electronically Signed   By: Inge Rise M.D.   On: 09/28/2017 12:29   Dg Abd Portable 1v-small Bowel Obstruction Protocol-initial, 8 Hr Delay  Result Date: 09/29/2017 CLINICAL DATA:  Small bowel obstruction protocol. 8 hour postcontrast film. EXAM: PORTABLE ABDOMEN - 1 VIEW COMPARISON:  09/28/2017 FINDINGS: Previously administered contrast has passed largely into the colon, breaching is far as the mid descending colon. Moderate amount of gas does remain visible in the small and large intestine but there are no visible dilated loops on this supine image. Lap band remains evident. Nasogastric tube tip is only at the distal esophagus. IMPRESSION: At 8 hours, contrast has passed as far as the descending colon. This indicates that either the obstruction has resolved or it is low grade partial. Nasogastric tube tip only in the distal esophagus. Electronically Signed   By: Nelson Chimes M.D.   On: 09/29/2017 06:26   Dg Abd Portable 1v  Result Date: 09/28/2017 CLINICAL DATA:  NG tube adjustment. EXAM: PORTABLE ABDOMEN - 1 VIEW COMPARISON:  One-view abdomen at 3:50 p.m. FINDINGS: The NG tube is coiled in the distal esophagus, above the lap band. There is mild gaseous distention of the stomach and small bowel. Lung bases are clear. IMPRESSION: The NG tube is coiled in the distal esophagus, above the lap band. Electronically Signed   By: San Morelle M.D.   On: 09/28/2017 18:54   Dg Abd  Portable 1v-small Bowel Protocol-position Verification  Result Date: 09/28/2017 CLINICAL DATA:  confirm nasogastric (NG) tube placement advanced EXAM: PORTABLE ABDOMEN - 1 VIEW COMPARISON:  None. FINDINGS: Nasogastric tube tip not visualized on the included field of view. It does not pass into the stomach. IMPRESSION: Nasogastric tube has not advanced. Tip is above the included field of view, above the stomach. Electronically Signed   By: Lajean Manes M.D.   On: 09/28/2017 16:10   Dg Abd Portable 1v  Result Date: 09/28/2017 CLINICAL DATA:  Encounter for nasogastric tube placement. EXAM: PORTABLE ABDOMEN - 1 VIEW COMPARISON:  CT, 09/28/2017 at 11:37 a.m. FINDINGS: The tip of the nasogastric tube projects in the upper abdomen, but lies above the lap band, consistent with the tip being in the distal esophagus above the GE junction. Dilated loops small bowel are noted consistent with a partial small bowel obstruction. IMPRESSION: 1. Tip of the nasal/orogastric tube lies in the distal esophagus just above the lap band. This will be the further inserted, approximately 15 cm. Electronically Signed   By: Lajean Manes M.D.   On: 09/28/2017 15:07  Anti-infectives: Anti-infectives (From admission, onward)   None       Jackolyn Confer 09/29/2017

## 2017-09-30 DIAGNOSIS — Z0189 Encounter for other specified special examinations: Secondary | ICD-10-CM

## 2017-09-30 LAB — BASIC METABOLIC PANEL
ANION GAP: 5 (ref 5–15)
BUN: 10 mg/dL (ref 6–20)
CALCIUM: 8.9 mg/dL (ref 8.9–10.3)
CO2: 26 mmol/L (ref 22–32)
Chloride: 109 mmol/L (ref 101–111)
Creatinine, Ser: 0.7 mg/dL (ref 0.44–1.00)
Glucose, Bld: 119 mg/dL — ABNORMAL HIGH (ref 65–99)
POTASSIUM: 3.9 mmol/L (ref 3.5–5.1)
Sodium: 140 mmol/L (ref 135–145)

## 2017-09-30 LAB — CBC
HEMATOCRIT: 34.4 % — AB (ref 36.0–46.0)
Hemoglobin: 11.3 g/dL — ABNORMAL LOW (ref 12.0–15.0)
MCH: 28 pg (ref 26.0–34.0)
MCHC: 32.8 g/dL (ref 30.0–36.0)
MCV: 85.4 fL (ref 78.0–100.0)
Platelets: 314 10*3/uL (ref 150–400)
RBC: 4.03 MIL/uL (ref 3.87–5.11)
RDW: 17.2 % — AB (ref 11.5–15.5)
WBC: 9.7 10*3/uL (ref 4.0–10.5)

## 2017-09-30 MED ORDER — HYDROCORTISONE 2.5 % RE CREA: 1.0000 "application " | TOPICAL_CREAM | Freq: Four times a day (QID) | RECTAL | 0 refills | Status: DC | PRN

## 2017-09-30 MED ORDER — HYDROCORTISONE 1 % EX CREA: 1.0000 "application " | TOPICAL_CREAM | Freq: Three times a day (TID) | CUTANEOUS | 0 refills | Status: DC | PRN

## 2017-09-30 NOTE — Progress Notes (Signed)
Assessment unchanged. Pt verbalized understanding of dc instructions through teach back including follow up care and when to call MD. No scripts. Discharged via wc to front entrance accompanied by NT.

## 2017-09-30 NOTE — Progress Notes (Signed)
    CC: Abdominal pain  Subjective: She is still on full liquids.  Soft diet is ordered.  She has had a small loose BM.  Objective: Vital signs in last 24 hours: Temp:  [97.8 F (36.6 C)-98.7 F (37.1 C)] 98 F (36.7 C) (12/10 0505) Pulse Rate:  [70-79] 70 (12/10 0505) Resp:  [18] 18 (12/10 0505) BP: (125-130)/(71-78) 125/76 (12/10 0505) SpO2:  [95 %-100 %] 95 % (12/10 0505) Last BM Date: 09/29/17 480 p.o. 2000 IV Voided x8 Loose stools x5. Afebrile vital signs are stable Labs are normal. No film today.  Film yesterday shows contrast in the colon.  Intake/Output from previous day: 12/09 0701 - 12/10 0700 In: 2419.6 [P.O.:480; I.V.:1939.6] Out: 0  Intake/Output this shift: Total I/O In: 900 [P.O.:600; I.V.:300] Out: -   General appearance: alert, cooperative and no distress GI: soft, non-tender; bowel sounds normal; no masses,  no organomegaly  Lab Results:  Recent Labs    09/29/17 0520 09/30/17 0453  WBC 10.7* 9.7  HGB 12.0 11.3*  HCT 37.1 34.4*  PLT 358 314    BMET Recent Labs    09/29/17 0520 09/30/17 0453  NA 139 140  K 4.2 3.9  CL 105 109  CO2 25 26  GLUCOSE 102* 119*  BUN 12 10  CREATININE 0.68 0.70  CALCIUM 8.9 8.9   PT/INR No results for input(s): LABPROT, INR in the last 72 hours.  Recent Labs  Lab 09/28/17 1003  AST 36  ALT 32  ALKPHOS 88  BILITOT 0.3  PROT 8.0  ALBUMIN 4.1     Lipase     Component Value Date/Time   LIPASE 30 09/28/2017 1003     Medications: . chlorhexidine  15 mL Mouth Rinse BID  . dexamethasone  4 mg Intravenous Q12H  . enoxaparin (LOVENOX) injection  40 mg Subcutaneous Q24H  . estradiol  1 mg Oral Daily  . lip balm  1 application Topical BID  . mouth rinse  15 mL Mouth Rinse q12n4p   . sodium chloride 75 mL/hr at 09/30/17 0729  . chlorproMAZINE (THORAZINE) IV    . lactated ringers    . methocarbamol (ROBAXIN)  IV    . ondansetron (ZOFRAN) IV     Assessment/Plan Partial SBO S/P  laparoscopic gastric band placement 2008 Dr. Martin/appendectomy/cholecystectomy  Hypertension Hyperlipidemia Depression Obstructive sleep apnea Body mass index is 32.6  FEN: IV fluids/soft diet started this a.m. ID: None: DVT: Lovenox: Foley: None: Follow-up: PCP   She is tolerating the full liquids well, her diet is being advanced to a soft diet.  She is ambulating well.  From our standpoint she can go on full liquids.  She would like to go tonight, her husband is a Agricultural consultant and goes on duty tomorrow.      LOS: 2 days    Rebekah [name] 09/30/2017 [phone]

## 2017-09-30 NOTE — Discharge Summary (Signed)
Physician Discharge Summary  Rebekah [name] MLY:[id number] DOB: 12/19/1960 DOA: 09/28/2017  PCP: Colon Branch, MD  Admit date: 09/28/2017 Discharge date: 09/30/2017  Recommendations for Outpatient Follow-up:  1. Pt will need to follow up with PCP in 2-3 weeks post discharge 2. Please obtain BMP to evaluate electrolytes and kidney function 3. Please also check CBC to evaluate Hg and Hct levels  Discharge Diagnoses:  Active Problems:   Hyperlipidemia   Essential hypertension   SBO (small bowel obstruction) (HCC)   Discharge Condition: Stable  Diet recommendation: Heart healthy diet discussed in details   History of present illness:   Brief Narrative:  56 y.o.femalewith medical history significant ofhypertension, hyperlipidemia, depression, GERD, obesity status post lap band surgery 10 years ago with recent esophageal manometry done by GI 5 days ago presented with sudden onset of abdominal pain associated with nausea.  Assessment & Plan:   Active Problems:   SBO (small bowel obstruction) ([place]), partial - pt feeling better, NGT removed - diet advanced and pt tolerating well, wants to go home - surgery cleared for discharge     Leukocytosis - reactive, in the setting of the above - improving  DVT prophylaxis: Lovenox SQ Code Status: Full Family Communication: Patient at bedside  Disposition Plan: home   Consultants:   Surgery  Procedures:   None  Antimicrobials:   None  Procedures/Studies: Ct Abdomen Pelvis W Contrast  Result Date: 09/28/2017 CLINICAL DATA:  Left side abdominal pain with nausea and vomiting since yesterday. EXAM: CT ABDOMEN AND PELVIS WITH CONTRAST TECHNIQUE: Multidetector CT imaging of the abdomen and pelvis was performed using the standard protocol following bolus administration of intravenous contrast. CONTRAST:  100 ml ISOVUE-300 IOPAMIDOL (ISOVUE-300) INJECTION 61% COMPARISON:  CT abdomen and pelvis 06/21/2011. FINDINGS: Lower chest:  Mild dependent atelectasis is seen in the lung bases. No pleural or pericardial effusion. Heart size is normal. Hepatobiliary: There is diffuse fatty infiltration of the liver without focal lesion. The patient is status post cholecystectomy. Biliary tree is unremarkable. Pancreas: Unremarkable. No pancreatic ductal dilatation or surrounding inflammatory changes. Spleen: Normal in size without focal abnormality. Adrenals/Urinary Tract: Left adrenal myelolipoma is noted. The right adrenal gland appears normal. The kidneys, ureters and urinary bladder appear normal. Stomach/Bowel: Lap band is in place. The stomach and proximal small bowel loops are distended. Small bowel loops measure up to 4 cm in diameter with air-fluid levels present. Transition point is difficult to localize but appears to be just to the left of midline adjacent to inflation tube for the patient's lap band where multiple loops are adhesed together as seen on image 45-55 of series 3. The colon is largely decompressed but otherwise unremarkable. The patient is status post appendectomy. Vascular/Lymphatic: Aortic atherosclerosis. No enlarged abdominal or pelvic lymph nodes. Reproductive: Uterus and bilateral adnexa are unremarkable. Other: No ascites or free air. Musculoskeletal: Superior endplate compression fracture of L1 is new since the prior CT scan but appears remote. Lumbar spondylosis appears worst at L2-3. No lytic or sclerotic lesion. IMPRESSION: The examination is positive for small bowel obstruction. Transition point is likely in the mid abdomen where multiple loops appear adhesed together adjacent to tubing for the patient's lap band. No evidence of bowel ischemia. Fatty infiltration of the liver. Atherosclerosis. Mild L1 superior endplate compression fracture is new since 2012 but appears remote. Electronically Signed   By: Inge Rise M.D.   On: 09/28/2017 12:29   Dg Abd Portable 1v-small Bowel Obstruction Protocol-initial, 8 Hr  Delay  Result Date: 09/29/2017 CLINICAL DATA:  Small bowel obstruction protocol. 8 hour postcontrast film. EXAM: PORTABLE ABDOMEN - 1 VIEW COMPARISON:  09/28/2017 FINDINGS: Previously administered contrast has passed largely into the colon, breaching is far as the mid descending colon. Moderate amount of gas does remain visible in the small and large intestine but there are no visible dilated loops on this supine image. Lap band remains evident. Nasogastric tube tip is only at the distal esophagus. IMPRESSION: At 8 hours, contrast has passed as far as the descending colon. This indicates that either the obstruction has resolved or it is low grade partial. Nasogastric tube tip only in the distal esophagus. Electronically Signed   By: Nelson Chimes M.D.   On: 09/29/2017 06:26   Dg Abd Portable 1v  Result Date: 09/28/2017 CLINICAL DATA:  NG tube adjustment. EXAM: PORTABLE ABDOMEN - 1 VIEW COMPARISON:  One-view abdomen at 3:50 p.m. FINDINGS: The NG tube is coiled in the distal esophagus, above the lap band. There is mild gaseous distention of the stomach and small bowel. Lung bases are clear. IMPRESSION: The NG tube is coiled in the distal esophagus, above the lap band. Electronically Signed   By: San Morelle M.D.   On: 09/28/2017 18:54   Dg Abd Portable 1v-small Bowel Protocol-position Verification  Result Date: 09/28/2017 CLINICAL DATA:  confirm nasogastric (NG) tube placement advanced EXAM: PORTABLE ABDOMEN - 1 VIEW COMPARISON:  None. FINDINGS: Nasogastric tube tip not visualized on the included field of view. It does not pass into the stomach. IMPRESSION: Nasogastric tube has not advanced. Tip is above the included field of view, above the stomach. Electronically Signed   By: Lajean Manes M.D.   On: 09/28/2017 16:10   Dg Abd Portable 1v  Result Date: 09/28/2017 CLINICAL DATA:  Encounter for nasogastric tube placement. EXAM: PORTABLE ABDOMEN - 1 VIEW COMPARISON:  CT, 09/28/2017 at 11:37 a.m.  FINDINGS: The tip of the nasogastric tube projects in the upper abdomen, but lies above the lap band, consistent with the tip being in the distal esophagus above the GE junction. Dilated loops small bowel are noted consistent with a partial small bowel obstruction. IMPRESSION: 1. Tip of the nasal/orogastric tube lies in the distal esophagus just above the lap band. This will be the further inserted, approximately 15 cm. Electronically Signed   By: Lajean Manes M.D.   On: 09/28/2017 15:07     Discharge Exam: Vitals:   09/29/17 2004 09/30/17 0505  BP: 130/78 125/76  Pulse: 78 70  Resp: 18 18  Temp: 97.8 F (36.6 C) 98 F (36.7 C)  SpO2: 97% 95%   Vitals:   09/29/17 0500 09/29/17 1444 09/29/17 2004 09/30/17 0505  BP: (!) 147/85 126/71 130/78 125/76  Pulse: 81 79 78 70  Resp: 18 18 18 18   Temp: 98.4 F (36.9 C) 98.7 F (37.1 C) 97.8 F (36.6 C) 98 F (36.7 C)  TempSrc: Oral Oral Oral Oral  SpO2: 94% 100% 97% 95%  Weight:      Height:        General: Pt is alert, follows commands appropriately, not in acute distress Cardiovascular: Regular rate and rhythm, S1/S2 +, no murmurs, no rubs, no gallops Respiratory: Clear to auscultation bilaterally, no wheezing, no crackles, no rhonchi Abdominal: Soft, non tender, non distended, bowel sounds +, no guarding   Discharge Instructions   Allergies as of 09/30/2017      Reactions   Penicillins Hives, Rash   Has patient had a PCN  reaction causing immediate rash, facial/tongue/throat swelling, SOB or lightheadedness with hypotension: Yes Has patient had a PCN reaction causing severe rash involving mucus membranes or skin necrosis: Yes Has patient had a PCN reaction that required hospitalization: No Has patient had a PCN reaction occurring within the last 10 years: No If all of the above answers are "NO", then may proceed with Cephalosporin use.   Erythromycin Nausea Only   Gastrointestinal issues.      Medication List    TAKE  these medications   aspirin 81 MG tablet Take 1 tablet (81 mg total) by mouth daily.   DEPLIN 7.5 7.5-90.314 MG Caps Take 1 capsule by mouth daily.   DIVIGEL 1 MG/GM Gel Generic drug:  Estradiol Take 1 mg by mouth daily.   FINACEA 15 % Foam Generic drug:  Azelaic Acid Apply 1 application topically 2 (two) times daily as needed.   FISH OIL PO Take 2 tablets by mouth daily. chewables   hydrocortisone 2.5 % rectal cream Commonly known as:  ANUSOL-HC Apply 1 application topically 4 (four) times daily as needed for hemorrhoids.   hydrocortisone cream 1 % Apply 1 application topically 3 (three) times daily as needed for itching (minor skin irritation).   lisinopril 10 MG tablet Commonly known as:  PRINIVIL,ZESTRIL Take 1 tablet (10 mg total) daily by mouth.   multivitamin tablet Take 1 tablet by mouth daily.   nortriptyline 75 MG capsule Commonly known as:  PAMELOR Take 1 capsule (75 mg total) by mouth daily.   omeprazole 40 MG capsule Commonly known as:  PRILOSEC Take 1 capsule (40 mg total) by mouth daily.   PROBIOTIC-10 Chew Chew 1 tablet by mouth daily.   progesterone 100 MG capsule Commonly known as:  PROMETRIUM Take 100 mg by mouth daily.   ranitidine 150 MG tablet Commonly known as:  ZANTAC Take 150 mg by mouth 2 (two) times daily.   rosuvastatin 10 MG tablet Commonly known as:  CRESTOR Take 1 tablet (10 mg total) by mouth at bedtime.      Follow-up Bremer, MD Follow up.   Specialty:  Internal Medicine Contact information: [address] [place] [address] [phone]        Theodis Blaze, MD Follow up.   Specialty:  Internal Medicine Why:  call my cell phone with any questions [phone] Contact information: [address] [place] [address] [phone]            The results of significant diagnostics from this hospitalization (including imaging, microbiology, ancillary  and laboratory) are listed below for reference.     Microbiology: No results found for this or any previous visit (from the past 240 hour(s)).   Labs: Basic Metabolic Panel: Recent Labs  Lab 09/28/17 1003 09/29/17 0520 09/30/17 0453  NA 132* 139 140  K 4.2 4.2 3.9  CL 97* 105 109  CO2 24 25 26   GLUCOSE 126* 102* 119*  BUN 13 12 10   CREATININE 0.77 0.68 0.70  CALCIUM 9.9 8.9 8.9   Liver Function Tests: Recent Labs  Lab 09/28/17 1003  AST 36  ALT 32  ALKPHOS 88  BILITOT 0.3  PROT 8.0  ALBUMIN 4.1   Recent Labs  Lab 09/28/17 1003  LIPASE 30   CBC: Recent Labs  Lab 09/28/17 0818 09/29/17 0520 09/30/17 0453  WBC 18.2* 10.7* 9.7  HGB 15.3* 12.0 11.3*  HCT 45.1 37.1 34.4*  MCV  83.2 85.7 85.4  PLT 365 358 314    SIGNED: Time coordinating discharge: 60 minutes  Faye Ramsay, MD  [facility] 09/30/2017, 12:23 PM Pager [phone]  If 7PM-7AM, please contact night-coverage [email or web address] Password [email or web address]

## 2017-09-30 NOTE — Discharge Instructions (Signed)
Small Bowel Obstruction °A small bowel obstruction means that something is blocking the small bowel. The small bowel is also called the small intestine. It is the long tube that connects the stomach to the colon. An obstruction will stop food and fluids from passing through the small bowel. Treatment depends on what is causing the problem and how bad the problem is. °Follow these instructions at home: °· Get a lot of rest. °· Follow your diet as told by your doctor. You may need to: °? Only drink clear liquids until you start to get better. °? Avoid solid foods as told by your doctor. °· Take over-the-counter and prescription medicines only as told by your doctor. °· Keep all follow-up visits as told by your doctor. This is important. °Contact a doctor if: °· You have a fever. °· You have chills. °Get help right away if: °· You have pain or cramps that get worse. °· You throw up (vomit) blood. °· You have a feeling of being sick to your stomach (nausea) that does not go away. °· You cannot stop throwing up. °· You cannot drink fluids. °· You feel confused. °· You feel dry or thirsty (dehydrated). °· Your belly gets more bloated. °· You feel weak or you pass out (faint). °This information is not intended to replace advice given to you by your health care provider. Make sure you discuss any questions you have with your health care provider. °Document Released: 11/15/2004 Document Revised: 06/04/2016 Document Reviewed: 12/02/2014 °Elsevier Interactive Patient Education © 2018 [employer] ° °

## 2017-10-02 ENCOUNTER — Telehealth: Payer: Self-pay

## 2017-10-02 LAB — HIV ANTIBODY (ROUTINE TESTING W REFLEX): HIV Screen 4th Generation wRfx: NONREACTIVE

## 2017-10-02 NOTE — Telephone Encounter (Signed)
10/02/17  TCM Hospital Follow Up  Transition Care Management Follow-up Telephone Call  ADMISSION DATE: 09/28/17  DISCHARGE DATE: 09/30/17  Note: Patient has orders for BMP and CBC on hospital follow up visit.  How have you been since you were released from the hospital? Patient states she has has fatigue and anxiousness since discharge.   Do you understand why you were in the hospital? Yes per patient.   Do you understand the discharge instrcutions? Yes per patient    Items Reviewed:  Medications reviewed: Yes   Allergies reviewed: Yes   Dietary changes reviewed:Heart healthy soft diet per patient and discharge order.   Referrals reviewed: Appointment scheduled with provider for follow up on 10/16/17 @ 10:40 am.   Functional Questionnaire:   Activities of Daily Living (ADLs): Patient states she can perform all ADL'S independently.  Any patient concerns? Given medications for hemrrhoids but she does not understand why.    Confirmed importance and date/time of follow-up visits scheduled: Yes    Confirmed with patient if condition begins to worsen call PCP or go to the ER. Yes    Patient was given the office nuagred to call back wimber and encourth questions or concerns.Yes

## 2017-10-16 ENCOUNTER — Encounter: Payer: Self-pay | Admitting: Internal Medicine

## 2017-10-16 ENCOUNTER — Ambulatory Visit (INDEPENDENT_AMBULATORY_CARE_PROVIDER_SITE_OTHER): Payer: 59 | Admitting: Internal Medicine

## 2017-10-16 VITALS — BP 119/80 | HR 89 | Temp 98.6°F | Resp 14 | Ht 64.0 in | Wt 197.2 lb

## 2017-10-16 DIAGNOSIS — K56609 Unspecified intestinal obstruction, unspecified as to partial versus complete obstruction: Secondary | ICD-10-CM

## 2017-10-16 DIAGNOSIS — K219 Gastro-esophageal reflux disease without esophagitis: Secondary | ICD-10-CM | POA: Diagnosis not present

## 2017-10-16 DIAGNOSIS — S32010A Wedge compression fracture of first lumbar vertebra, initial encounter for closed fracture: Secondary | ICD-10-CM | POA: Diagnosis not present

## 2017-10-16 LAB — CBC WITH DIFFERENTIAL/PLATELET
BASOS PCT: 0.6 % (ref 0.0–3.0)
Basophils Absolute: 0.1 10*3/uL (ref 0.0–0.1)
EOS PCT: 2.4 % (ref 0.0–5.0)
Eosinophils Absolute: 0.2 10*3/uL (ref 0.0–0.7)
HCT: 39.5 % (ref 36.0–46.0)
Hemoglobin: 12.9 g/dL (ref 12.0–15.0)
LYMPHS ABS: 3.1 10*3/uL (ref 0.7–4.0)
Lymphocytes Relative: 35 % (ref 12.0–46.0)
MCHC: 32.7 g/dL (ref 30.0–36.0)
MCV: 86.2 fl (ref 78.0–100.0)
MONOS PCT: 6.1 % (ref 3.0–12.0)
Monocytes Absolute: 0.5 10*3/uL (ref 0.1–1.0)
NEUTROS PCT: 55.9 % (ref 43.0–77.0)
Neutro Abs: 4.9 10*3/uL (ref 1.4–7.7)
Platelets: 325 10*3/uL (ref 150.0–400.0)
RBC: 4.58 Mil/uL (ref 3.87–5.11)
RDW: 17.6 % — ABNORMAL HIGH (ref 11.5–15.5)
WBC: 8.7 10*3/uL (ref 4.0–10.5)

## 2017-10-16 LAB — BASIC METABOLIC PANEL
BUN: 13 mg/dL (ref 6–23)
CHLORIDE: 105 meq/L (ref 96–112)
CO2: 26 meq/L (ref 19–32)
Calcium: 9.1 mg/dL (ref 8.4–10.5)
Creatinine, Ser: 0.81 mg/dL (ref 0.40–1.20)
GFR: 77.98 mL/min (ref >60.00)
GLUCOSE: 78 mg/dL (ref 70–99)
POTASSIUM: 3.9 meq/L (ref 3.5–5.1)
SODIUM: 139 meq/L (ref 135–145)

## 2017-10-16 NOTE — Progress Notes (Signed)
Subjective:    Patient ID: Rebekah [name], female    DOB: February 09, 1961, 56 y.o.   MRN: [id number]  DOS:  10/16/2017 Type of visit - description : TCM 14 Interval history: Admitted to the hospital and discharged 09/30/2017. Admitted with abdominal pain and nausea Eventually diagnosed with SBO,  better with conservative treatment including a NGT. Diet was advanced and she was discharged home. Was recommended to check a BMP and CBC CT reviewed, other than the GI findings she had a remote L1 superior endplate compression fracture  Review of Systems Since se left the hospital is feeling better.  Appetite is good, good p.o. tolerance, BMs are back to normal, no blood in the stools. Denies fever or chills.  Patient only has "twinge" at the left upper abdomen, not new. She also has a problem with dysphagia, records reviewed.   Past Medical History:  Diagnosis Date  . Allergy   . Depression   . GERD (gastroesophageal reflux disease)   . Hyperlipidemia   . Hypertension   . IBS (irritable bowel syndrome)   . Obesity    Lap band surgery 12-08 Dr Hassell Done  . OSA (obstructive sleep apnea)    using a CPAP      Past Surgical History:  Procedure Laterality Date  . APPENDECTOMY    . BREAST REDUCTION SURGERY    . CHOLECYSTECTOMY    . CLOSED REDUCTION FINGER WITH PERCUTANEOUS PINNING Left 09/14/2014   Procedure: CLOSED REDUCTION PERCUTANEOUS PINNING ;  Surgeon: Leanora Cover, MD;  Location: [facility];  Service: Orthopedics;  Laterality: Left;  . ESOPHAGEAL MANOMETRY N/A 09/23/2017   Procedure: ESOPHAGEAL MANOMETRY (EM);  Surgeon: Mauri Pole, MD;  Location: [facility];  Service: Endoscopy;  Laterality: N/A;  . Lap Band Adjustment  09/01/2015   Dr. Redmond Pulling  . LAPAROSCOPIC GASTRIC BANDING  09/2007   Lap band surgery 12-08 Dr Hassell Done  . NASAL SINUS SURGERY    . TONSILLECTOMY     x2    Social History   Socioeconomic History  . Marital status: Married    Spouse name:  Not on file  . Number of children: 0  . Years of education: Not on file  . Highest education level: Not on file  Social Needs  . Financial resource strain: Not on file  . Food insecurity - worry: Not on file  . Food insecurity - inability: Not on file  . Transportation needs - medical: Not on file  . Transportation needs - non-medical: Not on file  Occupational History  . Occupation: Airline pilot, works from home  Tobacco Use  . Smoking status: Never Smoker  . Smokeless tobacco: Never Used  Substance and Sexual Activity  . Alcohol use: Yes    Comment: socially   . Drug use: No  . Sexual activity: Yes    Partners: Male    Birth control/protection: None  Other Topics Concern  . Not on file  Social History Narrative   Lives w/ husband      Allergies as of 10/16/2017      Reactions   Penicillins Hives, Rash   Has patient had a PCN reaction causing immediate rash, facial/tongue/throat swelling, SOB or lightheadedness with hypotension: Yes Has patient had a PCN reaction causing severe rash involving mucus membranes or skin necrosis: Yes Has patient had a PCN reaction that required hospitalization: No Has patient had a PCN reaction occurring within the last 10 years: No If all of the above answers are "NO",  then may proceed with Cephalosporin use.   Erythromycin Nausea Only   Gastrointestinal issues.      Medication List        Accurate as of 10/16/17  5:27 PM. Always use your most recent med list.          aspirin 81 MG tablet Take 1 tablet (81 mg total) by mouth daily.   DEPLIN 7.5 7.5-90.314 MG Caps Take 1 capsule by mouth daily.   DIVIGEL 1 MG/GM Gel Generic drug:  Estradiol Take 1 mg by mouth daily.   FINACEA 15 % Foam Generic drug:  Azelaic Acid Apply 1 application topically 2 (two) times daily as needed.   FISH OIL PO Take 2 tablets by mouth daily. chewables   lisinopril 10 MG tablet Commonly known as:  PRINIVIL,ZESTRIL Take 1 tablet (10 mg total) daily by  mouth.   multivitamin tablet Take 1 tablet by mouth daily.   nortriptyline 75 MG capsule Commonly known as:  PAMELOR Take 1 capsule (75 mg total) by mouth daily.   omeprazole 40 MG capsule Commonly known as:  PRILOSEC Take 1 capsule (40 mg total) by mouth daily.   PROBIOTIC-10 Chew Chew 1 tablet by mouth daily.   progesterone 100 MG capsule Commonly known as:  PROMETRIUM Take 100 mg by mouth daily.   ranitidine 150 MG tablet Commonly known as:  ZANTAC Take 150 mg by mouth 2 (two) times daily.   rosuvastatin 10 MG tablet Commonly known as:  CRESTOR Take 1 tablet (10 mg total) by mouth at bedtime.          Objective:   Physical Exam BP 119/80 (BP Location: Left Arm, Patient Position: Sitting, Cuff Size: Small)   Pulse 89   Temp 98.6 F (37 C) (Oral)   Resp 14   Ht 5\' 4"  (1.626 m)   Wt 197 lb 4 oz (89.5 kg)   SpO2 99%   BMI 33.86 kg/m  General:   Well developed, well nourished . NAD.  HEENT:  Normocephalic . Face symmetric, atraumatic Lungs:  CTA B Normal respiratory effort, no intercostal retractions, no accessory muscle use. Heart: RRR,  no murmur.  no pretibial edema bilaterally  Abdomen:  Not distended, soft, slightly TTP at the left upper abdomen without mass or rebound.  Good bowel sounds Skin: Not pale. Not jaundice Neurologic:  alert & oriented X3.  Speech normal, gait appropriate for age and unassisted Psych--  Cognition and judgment appear intact.  Cooperative with normal attention span and concentration.  Behavior appropriate. No anxious or depressed appearing.     Assessment & Plan:   Assessment  Prediabetes HTN Hyperlipidemia: lipitor aches (2018) Anxiety, depression (chronic sx, occ suicidal ideas, h/o self cutting). Dr Casimiro Needle GERD Obesity, lap band surgery 2008 OSA: not using CPAP as off 05-2017 Acne (doxy) ++FH CAD: F, M , B MI age 46, G-parents   PLAN: SBO: Admitted to the hospital, treated conservatively, will check a BMP  and CBC as rec by the hospital team.  She seems to be doing better. Unclear if SBO was related to lap band. Plans to see general surgery for hospital f/u . GERD: Was seen by GI 08/06/2017 d/t GERD-regurgitation; subsequently had a EGD , colonoscopy and a esophageal manometry.  Based on the test was recommended to discuss possibly complete deflation orl removal  of the lap band by surgery.  Appointment pending.  Currently doing okay, in the last few weeks had only one episode of dysphagia.   Remote  vertebral fracture: Per CT 09-2017, an incidental finding, will discuss issue with her next physical. RTC 4 months

## 2017-10-16 NOTE — Assessment & Plan Note (Addendum)
SBO: Admitted to the hospital, treated conservatively, will check a BMP and CBC as rec by the hospital team.  She seems to be doing better. Unclear if SBO was related to lap band. Plans to see general surgery for hospital f/u . GERD: Was seen by GI 08/06/2017 d/t GERD-regurgitation; subsequently had a EGD , colonoscopy and a esophageal manometry.  Based on the test was recommended to discuss possibly complete deflation orl removal  of the lap band by surgery.  Appointment pending.  Currently doing okay, in the last few weeks had only one episode of dysphagia.   Remote  vertebral fracture: Per CT 09-2017, an incidental finding, will discuss issue with her next physical. RTC 4 months

## 2017-10-16 NOTE — Patient Instructions (Signed)
GO TO THE LAB : Get the blood work     GO TO THE FRONT DESK Schedule your next appointment for a  checkup in 4-5 months   

## 2017-10-16 NOTE — Progress Notes (Signed)
Pre visit review using our clinic review tool, if applicable. No additional management support is needed unless otherwise documented below in the visit note. 

## 2017-10-18 ENCOUNTER — Encounter: Payer: Self-pay | Admitting: Gastroenterology

## 2017-10-22 DIAGNOSIS — K56609 Unspecified intestinal obstruction, unspecified as to partial versus complete obstruction: Secondary | ICD-10-CM

## 2017-10-22 HISTORY — DX: Unspecified intestinal obstruction, unspecified as to partial versus complete obstruction: K56.609

## 2017-11-05 ENCOUNTER — Other Ambulatory Visit: Payer: Self-pay | Admitting: Surgery

## 2017-11-05 DIAGNOSIS — Z9884 Bariatric surgery status: Secondary | ICD-10-CM

## 2017-11-08 ENCOUNTER — Ambulatory Visit
Admission: RE | Admit: 2017-11-08 | Discharge: 2017-11-08 | Disposition: A | Discharge status: Home / Self Care | Payer: 59 | Source: Ambulatory Visit | Attending: Surgery | Admitting: Surgery

## 2017-11-08 DIAGNOSIS — Z9884 Bariatric surgery status: Secondary | ICD-10-CM

## 2017-11-18 ENCOUNTER — Encounter: Payer: Self-pay | Admitting: Neurology

## 2017-11-20 ENCOUNTER — Encounter: Payer: Self-pay | Admitting: Neurology

## 2017-11-25 ENCOUNTER — Encounter: Payer: Self-pay | Admitting: Neurology

## 2017-11-25 ENCOUNTER — Ambulatory Visit (INDEPENDENT_AMBULATORY_CARE_PROVIDER_SITE_OTHER): Payer: 59 | Admitting: Neurology

## 2017-11-25 VITALS — BP 115/79 | HR 84 | Ht 64.0 in | Wt 202.0 lb

## 2017-11-25 DIAGNOSIS — G4733 Obstructive sleep apnea (adult) (pediatric): Secondary | ICD-10-CM | POA: Diagnosis not present

## 2017-11-25 DIAGNOSIS — Z9289 Personal history of other medical treatment: Secondary | ICD-10-CM

## 2017-11-25 DIAGNOSIS — Z9989 Dependence on other enabling machines and devices: Secondary | ICD-10-CM

## 2017-11-25 NOTE — Progress Notes (Signed)
Subjective:    Patient ID: Rebekah [name] is a 57 y.o. female.  HPI     Interim history:   Rebekah [name] is a 57 year old right-handed woman with an underlying medical history of hypertension, hyperlipidemia, allergies, obesity with status post weight loss surgery nearly 10 years ago, reflux disease and depression, who presents for follow-up consultation of her obstructive sleep apnea, after recent home sleep testing and starting AutoPap therapy. The patient is unaccompanied today. I first met her on 07/08/2017 at the request of her primary care physician, at which time she reported a prior diagnosis of obstructive sleep apnea. She had a CPAP machine but she stopped using CPAP. She had achieved weight loss after her bariatric surgery. She reported having gained some weight back. She was also not resting well, had recurrence of snoring. I suggested we proceed with sleep study testing. Her insurance denied an attended sleep study. She had a home sleep test on 08/28/2017 which showed an AHI of 5.8 per hour, average oxygen saturation of 94%, nadir of 75%.  Today, 11/25/2017: I reviewed her AutoPap compliance data from 10/20/2017 through 11/18/2017 which is a total of 30 days, during which time she used her AutoPap 21 days with percent used days greater than 4 hours at 63%, indicating suboptimal compliance with an average usage of 7 hours and 29 minutes when she is on treatment. Residual AHI at goal at 0.9 per hour, pressure for the 95th percentile at 11.7 cm, leak on the higher end with the 95th percentile at 22 L/m, pressure range of 5-12 cm. She reports that she generally starts off okay but her mask makes abnormal sounds and she also does not tend to use her AutoPap when her husband is on call and she does not like to use it when she is sleeping alone as she cannot hear her surroundings. She reports that she actually does feel better rested and sleeps better when she is able to use her CPAP. In the early  morning hours she hears a gurgling sound, air leaking from the mask which wakes her up. Other than that, she tolerated the mask on the pressure setting. She is using what sounds like a dreamwear full facemask. Of note, she was hospitalized for SBO in 12/18, doing better. Sees GI and has seen her bariatric surgeon. She also had a recent upper and lower GI endoscopy.  Previously:  07/08/2017: (She) was previously diagnosed with obstructive sleep apnea several years ago and placed on CPAP. A sleep study report was reviewed from 05/13/2005, interpreted by Dr. Baird Lyons at the time. Sleep efficiency was 88%, sleep latency 46 minutes, REM latency 270 minutes, average oxygen saturation 96%, nadir was 77%. EKG showed occasional PVCs. Total AHI was 12.9 per hour. I reviewed your office note from 06/05/2017. She reports that she came back for a CPAP titration. Study results from her titration study are not available for my review today. She had a CPAP machine but has not used it in years. She reports that after she lost weight after her lap band surgery in 2008 she did not feel the need to keep using her CPAP machine. She lost about 70 pounds but gained about 40 back. Since her weight gain in the past years she has started snoring again and does not feel rested. She has intermittent restless leg symptoms. She is not aware of any family history of OSA. Her father has interstitial lung disease is understand. It may be a hereditary form.  Her Epworth sleepiness score is 10 out of 24 today, her fatigue score is 53 out of 63. She lives with her husband, she has no children. She works for an [employer] is a nonsmoker, drinks alcohol infrequently, about once a month and caffeine in the form of soda, about 3 cups per day, typically no coffee. She has 2 dogs and 4 cats. One dog sleeps at the foot and. She suffers from reflux disease and has to sleep fairly upright. She also takes a PPI and Pepcid. Bedtime is around  9:30 and wake up time around 6:30. She had a tonsillectomy at age 51 months and then again at 57 years old. She had nasal surgery including polypectomy is understand and septum surgery some 25 years ago.  Her Past Medical History Is Significant For: Past Medical History:  Diagnosis Date  . Allergy   . Depression   . GERD (gastroesophageal reflux disease)   . Hyperlipidemia   . Hypertension   . IBS (irritable bowel syndrome)   . Obesity    Lap band surgery 12-08 Dr Hassell Done  . OSA (obstructive sleep apnea)    using a CPAP      Her Past Surgical History Is Significant For: Past Surgical History:  Procedure Laterality Date  . APPENDECTOMY    . BREAST REDUCTION SURGERY    . CHOLECYSTECTOMY    . CLOSED REDUCTION FINGER WITH PERCUTANEOUS PINNING Left 09/14/2014   Procedure: CLOSED REDUCTION PERCUTANEOUS PINNING ;  Surgeon: Leanora Cover, MD;  Location: [facility];  Service: Orthopedics;  Laterality: Left;  . ESOPHAGEAL MANOMETRY N/A 09/23/2017   Procedure: ESOPHAGEAL MANOMETRY (EM);  Surgeon: Mauri Pole, MD;  Location: [facility];  Service: Endoscopy;  Laterality: N/A;  . Lap Band Adjustment  09/01/2015   Dr. Redmond Pulling  . LAPAROSCOPIC GASTRIC BANDING  09/2007   Lap band surgery 12-08 Dr Hassell Done  . NASAL SINUS SURGERY    . TONSILLECTOMY     x2    Her Family History Is Significant For: Family History  Problem Relation Age of Onset  . Coronary artery disease Father        CABG, smoker .age onset 61s  . Prostate cancer Father   . Pulmonary fibrosis Father        and others  . Breast cancer Mother        age 15  . Heart disease Mother        CABG, smoker .age onset 30s  . Diverticulitis Mother   . Heart attack Brother        age 63  . Diabetes Neg Hx   . Colon cancer Neg Hx   . Stomach cancer Neg Hx   . Esophageal cancer Neg Hx   . Rectal cancer Neg Hx     Her Social History Is Significant For: Social History   Socioeconomic History  . Marital  status: Married    Spouse name: None  . Number of children: 0  . Years of education: None  . Highest education level: None  Social Needs  . Financial resource strain: None  . Food insecurity - worry: None  . Food insecurity - inability: None  . Transportation needs - medical: None  . Transportation needs - non-medical: None  Occupational History  . Occupation: Airline pilot, works from home  Tobacco Use  . Smoking status: Never Smoker  . Smokeless tobacco: Never Used  Substance and Sexual Activity  . Alcohol use: Yes    Comment: socially   .  Drug use: No  . Sexual activity: Yes    Partners: Male    Birth control/protection: None  Other Topics Concern  . None  Social History Narrative   Lives w/ husband    Her Allergies Are:  Allergies  Allergen Reactions  . Penicillins Hives and Rash    Has patient had a PCN reaction causing immediate rash, facial/tongue/throat swelling, SOB or lightheadedness with hypotension: Yes Has patient had a PCN reaction causing severe rash involving mucus membranes or skin necrosis: Yes Has patient had a PCN reaction that required hospitalization: No Has patient had a PCN reaction occurring within the last 10 years: No If all of the above answers are "NO", then may proceed with Cephalosporin use.   . Erythromycin Nausea Only    Gastrointestinal issues.  :   Her Current Medications Are:  Outpatient Encounter Medications as of 11/25/2017  Medication Sig  . aspirin 81 MG tablet Take 1 tablet (81 mg total) by mouth daily.  . Azelaic Acid (FINACEA) 15 % FOAM Apply 1 application topically 2 (two) times daily as needed.   Marland Kitchen DIVIGEL 1 MG/GM GEL Take 1 mg by mouth daily.   Marland Kitchen L-Methylfolate-Algae (DEPLIN 7.5) 7.5-90.314 MG CAPS Take 1 capsule by mouth daily.   Marland Kitchen lisinopril (PRINIVIL,ZESTRIL) 10 MG tablet Take 1 tablet (10 mg total) daily by mouth.  . Multiple Vitamin (MULTIVITAMIN) tablet Take 1 tablet by mouth daily.  . nortriptyline (PAMELOR) 75 MG capsule  Take 1 capsule (75 mg total) by mouth daily.  Marland Kitchen omeprazole (PRILOSEC) 40 MG capsule Take 1 capsule (40 mg total) by mouth daily.  . Probiotic Product (PROBIOTIC-10) CHEW Chew 1 tablet by mouth daily.   . progesterone (PROMETRIUM) 100 MG capsule Take 100 mg by mouth daily.  . ranitidine (ZANTAC) 150 MG tablet Take 150 mg by mouth 2 (two) times daily.  . rosuvastatin (CRESTOR) 10 MG tablet Take 1 tablet (10 mg total) by mouth at bedtime.  . [DISCONTINUED] Omega-3 Fatty Acids (FISH OIL PO) Take 2 tablets by mouth daily. chewables   Facility-Administered Encounter Medications as of 11/25/2017  Medication  . 0.9 %  sodium chloride infusion  :  Review of Systems:  Out of a complete 14 point review of systems, all are reviewed and negative with the exception of these symptoms as listed below: Review of Systems  Neurological:       Patient states that she will do ok with her mask for about the first couple of hours but then it will start to "bubble and do weird things," so she takes it off. She also notes that every third day her husband is not home b/c he is a Airline pilot and she doesn't wear it at all b/c she is alone and can't hear anything.     Objective:  Neurological Exam  Physical Exam Physical Examination:   Vitals:   11/25/17 1445  BP: 115/79  Pulse: 84    General Examination: The patient is a very pleasant 57 y.o. female in no acute distress. She appears well-developed and well-nourished and well groomed.   HEENT: Normocephalic, atraumatic, pupils are equal, round and reactive to light and accommodation. Extraocular tracking is good without limitation to gaze excursion or nystagmus noted. Normal smooth pursuit is noted. Hearing is grossly intact. Face is symmetric with normal facial animation and normal facial sensation. Speech is clear with no dysarthria noted. There is no hypophonia. There is no lip, neck/head, jaw or voice tremor. Neck is supple with full  range of passive and  active motion. Oropharynx exam reveals: moderate mouth dryness, adequate dental hygiene and mild airway crowding, due to smaller airway entry and redundant soft palate. Mallampati is class III. Tongue protrudes centrally and palate elevates symmetrically. Tonsils are absent.   Chest: Clear to auscultation without wheezing, rhonchi or crackles noted.  Heart: S1+S2+0, regular and normal without murmurs, rubs or gallops noted.   Abdomen: Soft, non-tender and non-distended with normal bowel sounds appreciated on auscultation.  Extremities: There is no edema noted in the distal lower extremities bilaterally.   Skin: Warm and dry without trophic changes noted. There are no varicose veins.  Musculoskeletal: exam reveals no obvious joint deformities, tenderness or joint swelling or erythema.   Neurologically:  Mental status: The patient is awake, alert and oriented in all 4 spheres. Her immediate and remote memory, attention, language skills and fund of knowledge are appropriate. There is no evidence of aphasia, agnosia, apraxia or anomia. Speech is clear with normal prosody and enunciation. Thought process is linear. Mood is normal and affect is normal.  Cranial nerves II - XII are as described above under HEENT exam.  Motor exam: Normal bulk, strength and tone is noted. There is no drift, tremor or rebound. Romberg is negative. Fine motor skills and coordination: grossly intact.  Cerebellar testing: No dysmetria or intention tremor. There is no truncal or gait ataxia.  Sensory exam: intact to light touch in the upper and lower extremities.  Gait, station and balance: She stands easily. No veering to one side is noted. No leaning to one side is noted. Posture is age-appropriate and stance is narrow based. Gait shows normal stride length and normal pace. No problems turning are noted. Tandem walk is unremarkable.   Assessment and Plan:  In summary, Mayuri O Rasmusson is a very pleasant 57 year old  female with an underlying medical history of hypertension, hyperlipidemia, allergies, obesity with status post weight loss surgery nearly 10 years ago, reflux disease and depression, who presents for follow-up consultation of her obstructive sleep apnea. She has a prior diagnosis of OSA before her weight loss surgery. She reported recurrence of daytime tiredness and snoring. She had a home sleep test that showed mild/borderline obstructive sleep apnea but she has been able to use AutoPap for this with good results. Unfortunately, she does not always use it consistently as her husband is on-call every third night and she does not like to use it when he is not in the house. She does not hear her environment when she is sleeping with her CPAP and does not feel safe. Nevertheless, she has benefited from using AutoPap therapy and is quite pleased with how she is doing. She is commended for her treatment adherence despite recent hospitalization for small bowel obstruction. At this juncture, she is advised to continue to use her AutoPap machine and follow-up routinely in one year. I answered all her questions today and she was in agreement.  I spent 25 minutes in total face-to-face time with the patient, more than 50% of which was spent in counseling and coordination of care, reviewing test results, reviewing medication and discussing or reviewing the diagnosis of OSA, its prognosis and treatment options. Pertinent laboratory and imaging test results that were available during this visit with the patient were reviewed by me and considered in my medical decision making (see chart for details).

## 2017-11-25 NOTE — Patient Instructions (Signed)
Please continue using your autoPAP regularly. While your insurance may require that you use PAP at least 4 hours each night on 70% of the nights, I recommend, that you not skip any nights and use it throughout the night if you can. Getting used to PAP and staying with the treatment long term does take time and patience and discipline. Untreated obstructive sleep apnea when it is moderate to severe can have an adverse impact on cardiovascular health and raise her risk for heart disease, arrhythmias, hypertension, congestive heart failure, stroke and diabetes. Untreated obstructive sleep apnea causes sleep disruption, nonrestorative sleep, and sleep deprivation. This can have an impact on your day to day functioning and cause daytime sleepiness and impairment of cognitive function, memory loss, mood disturbance, and problems focussing. Using PAP regularly can improve these symptoms.  Do the best you can. Your mild sleep apnea is under good control.   We can see you in one year routinely.

## 2017-12-11 ENCOUNTER — Other Ambulatory Visit: Payer: Self-pay | Admitting: Internal Medicine

## 2017-12-19 ENCOUNTER — Ambulatory Visit (INDEPENDENT_AMBULATORY_CARE_PROVIDER_SITE_OTHER): Payer: 59 | Admitting: Gastroenterology

## 2017-12-19 ENCOUNTER — Encounter: Payer: Self-pay | Admitting: Gastroenterology

## 2017-12-19 VITALS — BP 112/68 | HR 74 | Ht 64.0 in | Wt 200.5 lb

## 2017-12-19 DIAGNOSIS — K219 Gastro-esophageal reflux disease without esophagitis: Secondary | ICD-10-CM

## 2017-12-19 NOTE — Progress Notes (Signed)
    History of Present Illness: This is a 57 year old female returning for follow-up of GERD.  Her reflux symptoms are under very good control following release of her lap band which was causing partial obstructive symptoms.  She was subsequently hospitalized for partial small bowel obstruction that resolved with conservative management. UGI series ordered by Dr. Kaylyn Lim is below.   UGI series 10/2017: 1. Gastric lap band in place. No obstruction, but conspicuous narrowing of the lumen at the lap band despite absence of fluid from the band at this time. 2. Patulous and dilated thoracic esophagus with decreased esophageal motility and frequent tertiary contractions. 3. Prompt gastric emptying. Normal duodenum and visible proximal small bowel.   Current Medications, Allergies, Past Medical History, Past Surgical History, Family History and Social History were reviewed in [employer] record.  Physical Exam: General: Well developed, well nourished, no acute distress Head: Normocephalic and atraumatic Eyes:  sclerae anicteric, EOMI Ears: Normal auditory acuity Mouth: No deformity or lesions Lungs: Clear throughout to auscultation Heart: Regular rate and rhythm; no murmurs, rubs or bruits Abdomen: Soft, non tender and non distended. No masses, hepatosplenomegaly or hernias noted. Normal Bowel sounds Rectal: not done Musculoskeletal: Symmetrical with no gross deformities  Pulses:  Normal pulses noted Extremities: No clubbing, cyanosis, edema or deformities noted Neurological: Alert oriented x 4, grossly nonfocal Psychological:  Alert and cooperative. Normal mood and affect  Assessment and Recommendations:  1.  GERD.  Continue omeprazole 40 mg every morning and ranitidine 150 mg twice daily.  Continue to follow antireflux measures.   2. Status post lap band.   Partial obstructive symptoms have resolved following removal of the fluid from the lap band.  UGI series  remains abnormal.  Follow-up with Dr. Kaylyn Lim.

## 2017-12-19 NOTE — Patient Instructions (Signed)
Follow up in one year.   Thank you for choosing me and [facility].  Pricilla Riffle. Dagoberto Ligas., MD., [facility]

## 2017-12-31 ENCOUNTER — Other Ambulatory Visit: Payer: Self-pay | Admitting: Internal Medicine

## 2018-02-14 ENCOUNTER — Ambulatory Visit: Payer: 59 | Admitting: Internal Medicine

## 2018-03-04 ENCOUNTER — Ambulatory Visit (INDEPENDENT_AMBULATORY_CARE_PROVIDER_SITE_OTHER): Payer: 59 | Admitting: Internal Medicine

## 2018-03-04 ENCOUNTER — Encounter: Payer: Self-pay | Admitting: Internal Medicine

## 2018-03-04 VITALS — BP 124/66 | HR 71 | Temp 98.2°F | Resp 16 | Ht 64.0 in | Wt 199.2 lb

## 2018-03-04 DIAGNOSIS — Z789 Other specified health status: Secondary | ICD-10-CM

## 2018-03-04 DIAGNOSIS — R739 Hyperglycemia, unspecified: Secondary | ICD-10-CM

## 2018-03-04 DIAGNOSIS — I10 Essential (primary) hypertension: Secondary | ICD-10-CM | POA: Diagnosis not present

## 2018-03-04 LAB — BASIC METABOLIC PANEL
BUN: 10 mg/dL (ref 6–23)
CHLORIDE: 103 meq/L (ref 96–112)
CO2: 27 mEq/L (ref 19–32)
CREATININE: 0.83 mg/dL (ref 0.40–1.20)
Calcium: 9.4 mg/dL (ref 8.4–10.5)
GFR: 75.71 mL/min (ref >60.00)
GLUCOSE: 74 mg/dL (ref 70–99)
POTASSIUM: 4.5 meq/L (ref 3.5–5.1)
Sodium: 140 mEq/L (ref 135–145)

## 2018-03-04 LAB — HEMOGLOBIN A1C: HEMOGLOBIN A1C: 6.2 % (ref 4.6–6.5)

## 2018-03-04 NOTE — Progress Notes (Signed)
Subjective:    Patient ID: Rebekah [name], female    DOB: November 19, 1960, 57 y.o.   MRN: [id number]  DOS:  03/04/2018 Type of visit - description : Follow-up Interval history:  HTN: Good med compliance, no recent ambulatory BPs Concerned about measles, booster? Obesity: Still an issue, thinking about changing her diet, admits that drinking regular sodas are  part of the problem.   Review of Systems No GERD symptoms at all   Past Medical History:  Diagnosis Date  . Allergy   . Bowel obstruction ([place]) 2019  . Depression   . GERD (gastroesophageal reflux disease)   . Hyperlipidemia   . Hypertension   . IBS (irritable bowel syndrome)   . Obesity    Lap band surgery 12-08 Dr Hassell Done  . OSA (obstructive sleep apnea)    using a CPAP      Past Surgical History:  Procedure Laterality Date  . APPENDECTOMY    . BREAST REDUCTION SURGERY    . CHOLECYSTECTOMY    . CLOSED REDUCTION FINGER WITH PERCUTANEOUS PINNING Left 09/14/2014   Procedure: CLOSED REDUCTION PERCUTANEOUS PINNING ;  Surgeon: Leanora Cover, MD;  Location: [place];  Service: Orthopedics;  Laterality: Left;  . ESOPHAGEAL MANOMETRY N/A 09/23/2017   Procedure: ESOPHAGEAL MANOMETRY (EM);  Surgeon: Mauri Pole, MD;  Location: [facility];  Service: Endoscopy;  Laterality: N/A;  . Lap Band Adjustment  09/01/2015   Dr. Redmond Pulling  . LAPAROSCOPIC GASTRIC BANDING  09/2007   Lap band surgery 12-08 Dr Hassell Done  . NASAL SINUS SURGERY    . TONSILLECTOMY     x2    Social History   Socioeconomic History  . Marital status: Married    Spouse name: Not on file  . Number of children: 0  . Years of education: Not on file  . Highest education level: Not on file  Occupational History  . Occupation: Airline pilot, works from home  Social Needs  . Financial resource strain: Not on file  . Food insecurity:    Worry: Not on file    Inability: Not on file  . Transportation needs:    Medical: Not on file    Non-medical:  Not on file  Tobacco Use  . Smoking status: Never Smoker  . Smokeless tobacco: Never Used  Substance and Sexual Activity  . Alcohol use: Yes    Comment: socially   . Drug use: No  . Sexual activity: Yes    Partners: Male    Birth control/protection: None  Lifestyle  . Physical activity:    Days per week: Not on file    Minutes per session: Not on file  . Stress: Not on file  Relationships  . Social connections:    Talks on phone: Not on file    Gets together: Not on file    Attends religious service: Not on file    Active member of club or organization: Not on file    Attends meetings of clubs or organizations: Not on file    Relationship status: Not on file  . Intimate partner violence:    Fear of current or ex partner: Not on file    Emotionally abused: Not on file    Physically abused: Not on file    Forced sexual activity: Not on file  Other Topics Concern  . Not on file  Social History Narrative   Lives w/ husband      Allergies as of 03/04/2018  Reactions   Penicillins Hives, Rash   Has patient had a PCN reaction causing immediate rash, facial/tongue/throat swelling, SOB or lightheadedness with hypotension: Yes Has patient had a PCN reaction causing severe rash involving mucus membranes or skin necrosis: Yes Has patient had a PCN reaction that required hospitalization: No Has patient had a PCN reaction occurring within the last 10 years: No If all of the above answers are "NO", then may proceed with Cephalosporin use.   Erythromycin Nausea Only   Gastrointestinal issues.      Medication List        Accurate as of 03/04/18 11:59 PM. Always use your most recent med list.          aspirin 81 MG tablet Take 1 tablet (81 mg total) by mouth daily.   DEPLIN 7.5 7.5-90.314 MG Caps Take 1 capsule by mouth daily.   DIVIGEL 1 MG/GM Gel Generic drug:  Estradiol Take 1 mg by mouth daily.   FINACEA 15 % Foam Generic drug:  Azelaic Acid Apply 1 application  topically 2 (two) times daily as needed.   lisinopril 10 MG tablet Commonly known as:  PRINIVIL,ZESTRIL Take 1 tablet (10 mg total) daily by mouth.   multivitamin tablet Take 1 tablet by mouth daily.   nortriptyline 75 MG capsule Commonly known as:  PAMELOR Take 1 capsule (75 mg total) by mouth daily.   omeprazole 40 MG capsule Commonly known as:  PRILOSEC Take 1 capsule (40 mg total) by mouth daily.   PROBIOTIC-10 Chew Chew 1 tablet by mouth daily.   progesterone 100 MG capsule Commonly known as:  PROMETRIUM Take 100 mg by mouth daily.   ranitidine 150 MG tablet Commonly known as:  ZANTAC Take 150 mg by mouth 2 (two) times daily.   rosuvastatin 10 MG tablet Commonly known as:  CRESTOR Take 1 tablet (10 mg total) by mouth at bedtime.          Objective:   Physical Exam BP 124/66 (BP Location: Left Arm, Patient Position: Sitting, Cuff Size: Small)   Pulse 71   Temp 98.2 F (36.8 C) (Oral)   Resp 16   Ht '5\' 4"'  (1.626 m)   Wt 199 lb 4 oz (90.4 kg)   SpO2 95%   BMI 34.20 kg/m  General:   Well developed, obese appearing. NAD.  HEENT:  Normocephalic . Face symmetric, atraumatic Lungs:  CTA B Normal respiratory effort, no intercostal retractions, no accessory muscle use. Heart: RRR,  no murmur.  No pretibial edema bilaterally  Skin: Not pale. Not jaundice Neurologic:  alert & oriented X3.  Speech normal, gait appropriate for age and unassisted Psych--  Cognition and judgment appear intact.  Cooperative with normal attention span and concentration.  Behavior appropriate. No anxious or depressed appearing.      Assessment & Plan:   Assessment  Prediabetes HTN Hyperlipidemia: lipitor aches (2018) Anxiety, depression (chronic sx, occ suicidal ideas, h/o self cutting). Dr Casimiro Needle GERD Obesity, lap band surgery 2008 OSA: not using CPAP as off 05-2017 Acne (doxy) ++FH CAD: F, M , B MI age 64, G-parents  SBO 09-2017  PLAN: HTN: Seems controlled on  Zestril, check a BMP Prediabetes: Check A1c Obesity: Currently LAP-BAND has no fluid in it, interestingly she stopped having regurgitation.  We had a long conversation about diet, her main problem is consumption of regular sodas.  Counseled Check MMR per pt request RTC 4 months

## 2018-03-04 NOTE — Patient Instructions (Signed)
GO TO THE LAB : Get the blood work     GO TO THE FRONT DESK Schedule your next appointment for a  physical exam in about 4 months.  Fasting.

## 2018-03-04 NOTE — Progress Notes (Signed)
Pre visit review using our clinic review tool, if applicable. No additional management support is needed unless otherwise documented below in the visit note. 

## 2018-03-05 LAB — MEASLES/MUMPS/RUBELLA IMMUNITY
Mumps IgG: 104 AU/mL
Rubella: 2.52 index
Rubeola IgG: 106 AU/mL

## 2018-03-05 NOTE — Assessment & Plan Note (Signed)
HTN: Seems controlled on Zestril, check a BMP Prediabetes: Check A1c Obesity: Currently LAP-BAND has no fluid in it, interestingly she stopped having regurgitation.  We had a long conversation about diet, her main problem is consumption of regular sodas.  Counseled Check MMR per pt request RTC 4 months

## 2018-03-31 ENCOUNTER — Other Ambulatory Visit: Payer: Self-pay | Admitting: Internal Medicine

## 2018-05-14 ENCOUNTER — Other Ambulatory Visit: Payer: Self-pay | Admitting: Internal Medicine

## 2018-05-16 ENCOUNTER — Encounter: Payer: Self-pay | Admitting: Internal Medicine

## 2018-06-24 ENCOUNTER — Other Ambulatory Visit: Payer: Self-pay

## 2018-06-24 MED ORDER — OMEPRAZOLE 40 MG PO CPDR: 40.0000 mg | DELAYED_RELEASE_CAPSULE | Freq: Every day | ORAL | 1 refills | Status: DC

## 2018-07-10 ENCOUNTER — Encounter: Payer: Self-pay | Admitting: Internal Medicine

## 2018-07-10 ENCOUNTER — Encounter (INDEPENDENT_AMBULATORY_CARE_PROVIDER_SITE_OTHER): Payer: 59

## 2018-07-10 ENCOUNTER — Ambulatory Visit (INDEPENDENT_AMBULATORY_CARE_PROVIDER_SITE_OTHER): Payer: 59 | Admitting: Internal Medicine

## 2018-07-10 VITALS — BP 128/84 | HR 74 | Temp 98.4°F | Resp 16 | Ht 64.0 in | Wt 203.2 lb

## 2018-07-10 DIAGNOSIS — R739 Hyperglycemia, unspecified: Secondary | ICD-10-CM | POA: Diagnosis not present

## 2018-07-10 DIAGNOSIS — Z0000 Encounter for general adult medical examination without abnormal findings: Secondary | ICD-10-CM | POA: Diagnosis not present

## 2018-07-10 DIAGNOSIS — R946 Abnormal results of thyroid function studies: Secondary | ICD-10-CM

## 2018-07-10 DIAGNOSIS — Z23 Encounter for immunization: Secondary | ICD-10-CM | POA: Diagnosis not present

## 2018-07-10 LAB — ALT: ALT: 24 U/L (ref 0–35)

## 2018-07-10 LAB — TSH: TSH: 4.65 u[IU]/mL — ABNORMAL HIGH (ref 0.35–4.50)

## 2018-07-10 LAB — LIPID PANEL
Cholesterol: 117 mg/dL (ref 0–200)
HDL: 48 mg/dL (ref >39.00)
LDL Cholesterol: 54 mg/dL (ref 0–99)
NONHDL: 69.14
Total CHOL/HDL Ratio: 2
Triglycerides: 77 mg/dL (ref 0.0–149.0)
VLDL: 15.4 mg/dL (ref 0.0–40.0)

## 2018-07-10 LAB — AST: AST: 19 U/L (ref 0–37)

## 2018-07-10 LAB — HEMOGLOBIN A1C: Hgb A1c MFr Bld: 6.3 % (ref 4.6–6.5)

## 2018-07-10 NOTE — Patient Instructions (Signed)
GO TO THE LAB : Get the blood work     GO TO THE FRONT DESK Schedule your next appointment for a checkup in 6 to 8 months  Please come back in 2 to 4 months for your second dose of Shingrix

## 2018-07-10 NOTE — Progress Notes (Signed)
Subjective:    Patient ID: Rebekah [name], female    DOB: 02-08-1961, 57 y.o.   MRN: [id number]  DOS:  07/10/2018 Type of visit - description : cpx Interval history: No major concerns except that she is gaining weight.  Wt Readings from Last 3 Encounters:  07/10/18 203 lb 4 oz (92.2 kg)  03/04/18 199 lb 4 oz (90.4 kg)  12/19/17 200 lb 8 oz (90.9 kg)   Review of Systems  Other than above, a 14 point review of systems is negative      Past Medical History:  Diagnosis Date  . Allergy   . Bowel obstruction ([place]) 2019  . Depression   . GERD (gastroesophageal reflux disease)   . Hyperlipidemia   . Hypertension   . IBS (irritable bowel syndrome)   . Obesity    Lap band surgery 12-08 Dr Hassell Done  . OSA (obstructive sleep apnea)    using a CPAP      Past Surgical History:  Procedure Laterality Date  . APPENDECTOMY    . BREAST REDUCTION SURGERY    . CHOLECYSTECTOMY    . CLOSED REDUCTION FINGER WITH PERCUTANEOUS PINNING Left 09/14/2014   Procedure: CLOSED REDUCTION PERCUTANEOUS PINNING ;  Surgeon: Leanora Cover, MD;  Location: [facility];  Service: Orthopedics;  Laterality: Left;  . ESOPHAGEAL MANOMETRY N/A 09/23/2017   Procedure: ESOPHAGEAL MANOMETRY (EM);  Surgeon: Mauri Pole, MD;  Location: [facility];  Service: Endoscopy;  Laterality: N/A;  . Lap Band Adjustment  09/01/2015   Dr. Redmond Pulling  . LAPAROSCOPIC GASTRIC BANDING  09/2007   Lap band surgery 12-08 Dr Hassell Done  . NASAL SINUS SURGERY    . TONSILLECTOMY     x2    Social History   Socioeconomic History  . Marital status: Married    Spouse name: Not on file  . Number of children: 0  . Years of education: Not on file  . Highest education level: Not on file  Occupational History  . Occupation: Airline pilot, works from home  Social Needs  . Financial resource strain: Not on file  . Food insecurity:    Worry: Not on file    Inability: Not on file  . Transportation needs:    Medical: Not on file    Non-medical: Not on file  Tobacco Use  . Smoking status: Never Smoker  . Smokeless tobacco: Never Used  Substance and Sexual Activity  . Alcohol use: Yes    Comment: socially   . Drug use: No  . Sexual activity: Yes    Partners: Male    Birth control/protection: None  Lifestyle  . Physical activity:    Days per week: Not on file    Minutes per session: Not on file  . Stress: Not on file  Relationships  . Social connections:    Talks on phone: Not on file    Gets together: Not on file    Attends religious service: Not on file    Active member of club or organization: Not on file    Attends meetings of clubs or organizations: Not on file    Relationship status: Not on file  . Intimate partner violence:    Fear of current or ex partner: Not on file    Emotionally abused: Not on file    Physically abused: Not on file    Forced sexual activity: Not on file  Other Topics Concern  . Not on file  Social History Narrative  Lives w/ husband     Family History  Problem Relation Age of Onset  . Coronary artery disease Father        CABG, smoker .age onset 2s  . Prostate cancer Father   . Pulmonary fibrosis Father        and others  . Breast cancer Mother        age 21  . Heart disease Mother        CABG, smoker .age onset 38s  . Diverticulitis Mother   . Heart attack Brother        age 61  . Diabetes Neg Hx   . Colon cancer Neg Hx   . Stomach cancer Neg Hx   . Esophageal cancer Neg Hx   . Rectal cancer Neg Hx     Allergies as of 07/10/2018      Reactions   Penicillins Hives, Rash   Has patient had a PCN reaction causing immediate rash, facial/tongue/throat swelling, SOB or lightheadedness with hypotension: Yes Has patient had a PCN reaction causing severe rash involving mucus membranes or skin necrosis: Yes Has patient had a PCN reaction that required hospitalization: No Has patient had a PCN reaction occurring within the last 10 years: No If all of the above  answers are "NO", then may proceed with Cephalosporin use.   Erythromycin Nausea Only   Gastrointestinal issues.      Medication List        Accurate as of 07/10/18 11:59 PM. Always use your most recent med list.          aspirin 81 MG tablet Take 1 tablet (81 mg total) by mouth daily.   DEPLIN 7.5 7.5-90.314 MG Caps Take 1 capsule by mouth daily.   DIVIGEL 1 MG/GM Gel Generic drug:  Estradiol Take 1 mg by mouth daily.   FINACEA 15 % Foam Generic drug:  Azelaic Acid Apply 1 application topically 2 (two) times daily as needed.   lisinopril 10 MG tablet Commonly known as:  PRINIVIL,ZESTRIL Take 1 tablet (10 mg total) by mouth daily.   multivitamin tablet Take 1 tablet by mouth daily.   nortriptyline 75 MG capsule Commonly known as:  PAMELOR Take 1 capsule (75 mg total) by mouth daily.   omeprazole 40 MG capsule Commonly known as:  PRILOSEC Take 1 capsule (40 mg total) by mouth daily.   PROBIOTIC-10 Chew Chew 1 tablet by mouth daily.   progesterone 100 MG capsule Commonly known as:  PROMETRIUM Take 100 mg by mouth daily.   ranitidine 150 MG tablet Commonly known as:  ZANTAC Take 150 mg by mouth 2 (two) times daily.   rosuvastatin 10 MG tablet Commonly known as:  CRESTOR Take 1 tablet (10 mg total) by mouth at bedtime.          Objective:   Physical Exam BP 128/84 (BP Location: Left Arm, Patient Position: Sitting, Cuff Size: Normal)   Pulse 74   Temp 98.4 F (36.9 C) (Oral)   Resp 16   Ht 5\' 4"  (1.626 m)   Wt 203 lb 4 oz (92.2 kg)   SpO2 98%   BMI 34.89 kg/m  General: Well developed, NAD, see BMI.  Neck: No  thyromegaly  HEENT:  Normocephalic . Face symmetric, atraumatic Lungs:  CTA B Normal respiratory effort, no intercostal retractions, no accessory muscle use. Heart: RRR,  no murmur.  No pretibial edema bilaterally  Abdomen:  Not distended, soft, mild diffuse tenderness (at baseline). No rebound or rigidity.  Skin: Exposed areas  without rash. Not pale. Not jaundice Neurologic:  alert & oriented X3.  Speech normal, gait appropriate for age and unassisted Strength symmetric and appropriate for age.  Psych: Cognition and judgment appear intact.  Cooperative with normal attention span and concentration.  Behavior appropriate. No anxious or depressed appearing.     Assessment & Plan:   Assessment  Prediabetes HTN Hyperlipidemia: lipitor aches (2018) Anxiety, depression (chronic sx, occ suicidal ideas, h/o self cutting). Dr Casimiro Needle GERD Obesity, lap band surgery 2008 OSA: not using CPAP as off 05-2017 Acne (doxy) ++FH CAD: F, M , B MI age 39, G-parents  SBO 09-2017  PLAN: Here for CPX Chronic medical problems seem stable, continue the same medications, checking labs. RTC 6-8 m

## 2018-07-10 NOTE — Progress Notes (Signed)
Pre visit review using our clinic review tool, if applicable. No additional management support is needed unless otherwise documented below in the visit note. 

## 2018-07-10 NOTE — Assessment & Plan Note (Addendum)
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

## 2018-07-11 ENCOUNTER — Encounter: Payer: Self-pay | Admitting: Internal Medicine

## 2018-07-11 NOTE — Assessment & Plan Note (Signed)
Here for CPX Chronic medical problems seem stable, continue the same medications, checking labs. RTC 6-8 m

## 2018-07-11 NOTE — Addendum Note (Signed)
Addended byDamita Dunnings D on: 07/11/2018 01:27 PM   Modules accepted: Orders

## 2018-07-21 ENCOUNTER — Encounter (INDEPENDENT_AMBULATORY_CARE_PROVIDER_SITE_OTHER): Payer: Self-pay | Admitting: Family Medicine

## 2018-07-21 ENCOUNTER — Ambulatory Visit (INDEPENDENT_AMBULATORY_CARE_PROVIDER_SITE_OTHER): Payer: 59 | Admitting: Family Medicine

## 2018-07-21 VITALS — BP 107/72 | HR 71 | Temp 97.6°F | Ht 64.0 in | Wt 198.0 lb

## 2018-07-21 DIAGNOSIS — F3289 Other specified depressive episodes: Secondary | ICD-10-CM

## 2018-07-21 DIAGNOSIS — Z0289 Encounter for other administrative examinations: Secondary | ICD-10-CM

## 2018-07-21 DIAGNOSIS — R7303 Prediabetes: Secondary | ICD-10-CM

## 2018-07-21 DIAGNOSIS — I10 Essential (primary) hypertension: Secondary | ICD-10-CM

## 2018-07-21 DIAGNOSIS — R0602 Shortness of breath: Secondary | ICD-10-CM

## 2018-07-21 DIAGNOSIS — R739 Hyperglycemia, unspecified: Secondary | ICD-10-CM | POA: Insufficient documentation

## 2018-07-21 DIAGNOSIS — R5383 Other fatigue: Secondary | ICD-10-CM

## 2018-07-21 DIAGNOSIS — E669 Obesity, unspecified: Secondary | ICD-10-CM

## 2018-07-21 DIAGNOSIS — Z9189 Other specified personal risk factors, not elsewhere classified: Secondary | ICD-10-CM | POA: Diagnosis not present

## 2018-07-21 DIAGNOSIS — Z6834 Body mass index (BMI) 34.0-34.9, adult: Secondary | ICD-10-CM

## 2018-07-21 NOTE — Progress Notes (Signed)
.  Office: [phone]  /  Fax: [phone]   HPI:   Chief Complaint: Rebekah Rebekah [name] (MR# [id number]) is a 57 y.o. female who presents on 07/21/2018 for obesity evaluation and treatment. Current BMI is Body mass index is 33.99 kg/m.Marland Kitchen Rebekah Rebekah [name] has struggled with obesity for years and has been unsuccessful in either losing weight or maintaining long term weight loss. Rebekah Rebekah [name] attended our information session and states she is currently in the action stage of change and ready to dedicate time achieving and maintaining a healthier weight.   Rebekah Rebekah [name] has history of lap band surgery.  Rebekah Rebekah [name] states her family eats meals together she thinks her family will eat healthier with  her she struggles with family and or coworkers weight loss sabotage her desired weight loss is 58 lbs she has been heavy most of  her life she started gaining weight at age 54 her heaviest weight ever Rebekah [name] 232 lbs she has significant food cravings issues  she snacks frequently in the evenings she is trying to eat vegetarian she is trying to eat vegan she is frequently drinking liquids with calories she frequently makes poor food choices she frequently eats larger portions than normal  she struggles with emotional eating    Rebekah Rebekah [name] Rebekah Rebekah [name] feels her energy is lower than it should be. This has worsened with weight gain and has not worsened recently. Rebekah [name] admits to daytime somnolence and  admits to waking up still tired. Patient is at risk for obstructive sleep apnea. Patent has a history of symptoms of daytime Rebekah Rebekah [name]. Patient generally gets 8 hours of sleep per night, and states they generally have generally restful sleep. Snoring is present. Apneic episodes are present. Epworth Sleepiness Score is 4.  Dyspnea on exertion Rebekah Rebekah [name] notes increasing shortness of breath with exercising and seems to be worsening over time with weight gain. She notes getting out of breath sooner with activity than she used to. This has not gotten  worse recently. EKG-Normal sinus rhythm. Rebekah Rebekah [name] denies orthopnea.  Pre-Diabetes Rebekah Rebekah [name] has a diagnosis of pre-diabetes based on her elevated Hgb A1c and Rebekah [name] informed this puts her at greater risk of developing diabetes. She has had pre-diabetes for at least 11 years. She is not on medications currently and continues to work on diet and exercise to decrease risk of diabetes. She denies nausea or hypoglycemia.  At risk for diabetes Rebekah Rebekah [name] is at higher than average risk for developing diabetes due to her obesity and pre-diabetes. She currently denies polyuria or polydipsia.  Hypertension Rebekah Rebekah [name] is a 57 y.o. female with hypertension. [name]'s blood pressure is very well controlled and she denies chest pain. She has been on lisinopril for approximately 20 years. She is working weight loss to help control her blood pressure with the goal of decreasing her risk of heart attack and stroke.   Depression with emotional eating behaviors Rebekah Rebekah [name] sees a Psychiatrist, and she notes a lot of family triggers. Rebekah Rebekah [name] struggles with emotional eating and using food for comfort to the extent that it is negatively impacting her health. She often snacks when she is not hungry. Rebekah Rebekah [name] sometimes feels she is out of control and then feels guilty that she made poor food choices. She has been working on behavior modification techniques to help reduce her emotional eating and has been somewhat successful. She shows no sign of suicidal or homicidal ideations.  Depression screen Rebekah Rebekah [name] 07/21/2018 06/05/2017 09/23/2015  Decreased Interest 3 1 0  Down, Depressed, Hopeless 3 1 0  PHQ - 2 Score 6 2 0  Altered sleeping 3 0 -  Tired, decreased energy 3 0 -  Change in appetite 3 0 -  Feeling bad or failure about yourself  3 0 -  Trouble concentrating 1 0 -  Moving slowly or fidgety/restless - 0 -  Suicidal thoughts 3 0 -  PHQ-9 Score 22 2 -  Difficult doing work/chores Very difficult - -    Depression Screen Rebekah Rebekah [name]  Depression screen PHQ Rebekah [name] 07/21/2018  Decreased Interest 3  Down, Depressed, Hopeless 3  PHQ - 2 Score 6  Altered sleeping 3  Tired, decreased energy 3  Change in appetite 3  Feeling bad or failure about yourself  3  Trouble concentrating 1  Moving slowly or fidgety/restless -  Suicidal thoughts 3  PHQ-9 Score 22  Difficult doing work/chores Very difficult    ALLERGIES: Allergies  Allergen Reactions  . Penicillins Hives and Rash    Has patient had a PCN reaction causing immediate rash, facial/tongue/throat swelling, SOB or lightheadedness with hypotension: Yes Has patient had a PCN reaction causing severe rash involving mucus membranes or skin necrosis: Yes Has patient had a PCN reaction that required hospitalization: No Has patient had a PCN reaction occurring within the last 10 years: No If all of the above answers are "NO", then may proceed with Cephalosporin use.   . Erythromycin Nausea Only    Gastrointestinal issues.    MEDICATIONS: Current Outpatient Medications on File Prior to Visit  Medication Sig Dispense Refill  . aspirin 81 MG tablet Take 1 tablet (81 mg total) by mouth daily. 90 tablet 3  . Azelaic Acid (FINACEA) 15 % FOAM Apply 1 application topically 2 (two) times daily as needed.     Marland Kitchen DIVIGEL 1 MG/GM GEL Take 1 mg by mouth daily.     Marland Kitchen L-Methylfolate-Algae (DEPLIN 7.5) 7.5-90.314 MG CAPS Take 1 capsule by mouth daily.     Marland Kitchen lisinopril (PRINIVIL,ZESTRIL) 10 MG tablet Take 1 tablet (10 mg total) by mouth daily. 90 tablet 1  . Multiple Vitamin (MULTIVITAMIN) tablet Take 1 tablet by mouth daily. 90 tablet 1  . nortriptyline (PAMELOR) 75 MG capsule Take 1 capsule (75 mg total) by mouth daily. 90 capsule 3  . omeprazole (PRILOSEC) 40 MG capsule Take 1 capsule (40 mg total) by mouth daily. 90 capsule 1  . Probiotic Product (PROBIOTIC-10) CHEW Chew 1 tablet by mouth daily.     . progesterone (PROMETRIUM) 100 MG capsule Take 100 mg by  mouth daily.    . ranitidine (ZANTAC) 150 MG tablet Take 150 mg by mouth 2 (two) times daily.    . rosuvastatin (CRESTOR) 10 MG tablet Take 1 tablet (10 mg total) by mouth at bedtime. 90 tablet 1   No current facility-administered medications on file prior to visit.     PAST MEDICAL HISTORY: Past Medical History:  Diagnosis Date  . Allergy   . Back pain   . Bowel obstruction ([place]) 2019  . Depression   . Gall bladder stones   . GERD (gastroesophageal reflux disease)   . Hyperlipidemia   . Hypertension   . IBS (irritable bowel syndrome)   . IBS (irritable bowel syndrome)   . Joint pain   . Obesity    Lap band surgery 12-08 Dr Hassell Done  . OSA (obstructive sleep apnea)    using a CPAP    . Pre-diabetes     PAST SURGICAL HISTORY:  Past Surgical History:  Procedure Laterality Date  . APPENDECTOMY    . BREAST REDUCTION SURGERY    . CHOLECYSTECTOMY    . CLOSED REDUCTION FINGER WITH PERCUTANEOUS PINNING Left 09/14/2014   Procedure: CLOSED REDUCTION PERCUTANEOUS PINNING ;  Surgeon: Leanora Cover, MD;  Location: [facility];  Service: Orthopedics;  Laterality: Left;  . ESOPHAGEAL MANOMETRY N/A 09/23/2017   Procedure: ESOPHAGEAL MANOMETRY (EM);  Surgeon: Mauri Pole, MD;  Location: [facility];  Service: Endoscopy;  Laterality: N/A;  . Lap Band Adjustment  09/01/2015   Dr. Redmond Pulling  . LAPAROSCOPIC GASTRIC BANDING  09/2007   Lap band surgery 12-08 Dr Hassell Done  . NASAL SINUS SURGERY    . TONSILLECTOMY     x2    SOCIAL HISTORY: Social History   Tobacco Use  . Smoking status: Never Smoker  . Smokeless tobacco: Never Used  Substance Use Topics  . Alcohol use: Yes    Comment: socially   . Drug use: No    FAMILY HISTORY: Family History  Problem Relation Age of Onset  . Coronary artery disease Father        CABG, smoker .age onset 1s  . Prostate cancer Father   . Pulmonary fibrosis Father        and others  . High blood pressure Father   . High  Cholesterol Father   . Depression Father   . Obesity Father   . Breast cancer Mother        age 21  . Heart disease Mother        CABG, smoker .age onset 9s  . Diverticulitis Mother   . High blood pressure Mother   . High Cholesterol Mother   . Thyroid disease Mother   . Depression Mother   . Obesity Mother   . Heart attack Brother        age 47  . Diabetes Neg Hx   . Colon cancer Neg Hx   . Stomach cancer Neg Hx   . Esophageal cancer Neg Hx   . Rectal cancer Neg Hx     ROS: Review of Systems  Constitutional: Positive for malaise/Rebekah Rebekah [name]. Negative for weight loss.  Eyes:       + Wear glasses or contacts  Respiratory: Positive for shortness of breath (with exertion).   Cardiovascular: Negative for chest pain and orthopnea.  Gastrointestinal: Negative for nausea.  Genitourinary: Negative for frequency.  Musculoskeletal: Positive for back pain.       + Muscle or joint pain + Muscle stiffness  Endo/Heme/Allergies: Negative for polydipsia.       Negative hypoglycemia  Psychiatric/Behavioral: Positive for depression. Negative for suicidal ideas.    PHYSICAL EXAM: Blood pressure 107/72, pulse 71, temperature 97.6 F (36.4 C), temperature source Oral, height 5\' 4"  (1.626 m), weight 198 lb (89.8 kg), SpO2 100 %. Body mass index is 33.99 kg/m. Physical Exam  Constitutional: She is oriented to person, place, and time. She appears well-developed and well-nourished.  HENT:  Head: Normocephalic and atraumatic.  Nose: Nose normal.  Eyes: EOM are normal. No scleral icterus.  Neck: Normal range of motion. Neck supple. No thyromegaly present.  Cardiovascular: Normal rate and regular rhythm.  Pulmonary/Chest: Effort normal. No respiratory distress.  Abdominal: Soft. There is no tenderness.  + Obesity  Musculoskeletal:  Range of Motion normal in all 4 extremities Trace edema noted in bilateral lower extremities  Neurological: She is alert and oriented to person, place, and  time. Coordination normal.  Skin: Skin is warm and dry.  Psychiatric: She has a normal mood and affect. Her behavior is normal.  Vitals reviewed.   RECENT LABS AND TESTS: BMET    Component Value Date/Time   NA 140 03/04/2018 0852   K 4.5 03/04/2018 0852   CL 103 03/04/2018 0852   CO2 27 03/04/2018 0852   GLUCOSE 74 03/04/2018 0852   GLUCOSE 95 08/24/2010 1048   BUN 10 03/04/2018 0852   CREATININE 0.83 03/04/2018 0852   CALCIUM 9.4 03/04/2018 0852   GFRNONAA >60 09/30/2017 0453   GFRAA >60 09/30/2017 0453   Lab Results  Component Value Date   HGBA1C 6.3 07/10/2018   No results found for: INSULIN CBC    Component Value Date/Time   WBC 8.7 10/16/2017 1140   RBC 4.58 10/16/2017 1140   HGB 12.9 10/16/2017 1140   HCT 39.5 10/16/2017 1140   PLT 325.0 10/16/2017 1140   MCV 86.2 10/16/2017 1140   MCH 28.0 09/30/2017 0453   MCHC 32.7 10/16/2017 1140   RDW 17.6 (H) 10/16/2017 1140   LYMPHSABS 3.1 10/16/2017 1140   MONOABS 0.5 10/16/2017 1140   EOSABS 0.2 10/16/2017 1140   BASOSABS 0.1 10/16/2017 1140   Iron/TIBC/Ferritin/ %Sat    Component Value Date/Time   IRON 86 01/07/2015 0902   FERRITIN 19.1 01/07/2015 0902   IRONPCTSAT 23.2 07/30/2008 0856   Lipid Panel     Component Value Date/Time   CHOL 117 07/10/2018 0819   TRIG 77.0 07/10/2018 0819   TRIG 99 08/30/2006 0904   HDL 48.00 07/10/2018 0819   CHOLHDL 2 07/10/2018 0819   VLDL 15.4 07/10/2018 0819   LDLCALC 54 07/10/2018 0819   Hepatic Function Panel     Component Value Date/Time   PROT 8.0 09/28/2017 1003   ALBUMIN 4.1 09/28/2017 1003   AST 19 07/10/2018 0819   ALT 24 07/10/2018 0819   ALKPHOS 88 09/28/2017 1003   BILITOT 0.3 09/28/2017 1003   BILIDIR 0.0 05/15/2012 1453      Component Value Date/Time   TSH 4.65 (H) 07/10/2018 0819   Vitamin D No recent labs  ECG  shows NSR with a rate of 75 BPM INDIRECT CALORIMETER done today shows a VO2 of 200 and a REE of 1392. Her calculated basal  metabolic rate is 8527 thus her basal metabolic rate is worse than expected.    ASSESSMENT AND PLAN: Other Rebekah Rebekah [name] - Plan: EKG 12-Lead, CBC With Differential, VITAMIN D 25 Hydroxy (Vit-D Deficiency, Fractures), Vitamin B12, Folate, T3, T4, free, TSH  Shortness of breath on exertion - Plan: CBC With Differential  Prediabetes - Plan: Insulin, random  Essential hypertension - Plan: Comprehensive metabolic panel  Other depression - with emotional eating  At risk for diabetes mellitus  Class 1 obesity with serious comorbidity and body mass index (BMI) of 34.0 to 34.9 in adult, unspecified obesity type  PLAN:  Rebekah Rebekah [name] Carly Rebekah [name] informed that her Rebekah Rebekah [name] may be related to obesity, depression or many other causes. Labs will be ordered, and in the meanwhile Rebekah Rebekah [name] has agreed to work on diet, exercise and weight loss to help with Rebekah Rebekah [name]. Proper sleep hygiene Rebekah [name] discussed including the need for 7-8 hours of quality sleep each night. A sleep study Rebekah [name] not ordered based on symptoms and Epworth score.  Dyspnea on exertion Rebekah Rebekah [name]'s shortness of breath appears to be obesity related and exercise induced. She has agreed to work on weight loss and gradually increase exercise to treat her exercise induced shortness of  breath. If Rebekah Rebekah [name] follows our instructions and loses weight without improvement of her shortness of breath, we will plan to refer to pulmonology. We will monitor this condition regularly. Rebekah Rebekah [name] agrees to this plan.  Pre-Diabetes Rebekah Rebekah [name] will continue to work on weight loss, exercise, and decreasing simple carbohydrates in her diet to help decrease the risk of diabetes. We dicussed metformin including benefits and risks. She Rebekah [name] informed that eating too many simple carbohydrates or too many calories at one sitting increases the likelihood of GI side effects. Alsie declined metformin for now and a prescription Rebekah [name] not written today. We will check labs today and Rebekah Rebekah [name] agrees to follow up with our  clinic in 2 weeks as directed to monitor her progress.  Diabetes risk counselling Lucciana Rebekah [name] given extended (15 minutes) diabetes prevention counseling today. She is 57 y.o. female and has risk factors for diabetes including obesity and pre-diabetes. We discussed intensive lifestyle modifications today with an emphasis on weight loss as well as increasing exercise and decreasing simple carbohydrates in her diet.  Hypertension We discussed sodium restriction, working on healthy weight loss, and a regular exercise program as the means to achieve improved blood pressure control. Lillia agreed with this plan and agreed to follow up as directed. We will continue to monitor her blood pressure as well as her progress with the above lifestyle modifications. She will continue her medications as prescribed and will watch for signs of hypotension as she continues her lifestyle modifications. We will check labs today and Merrianne agrees to follow up with our clinic in 2 weeks.  Depression with Emotional Eating Behaviors We discussed behavior modification techniques today to help Rasa deal with her emotional eating and depression. We will refer to Dr. Mallie Mussel, our bariatric psychologist, and Taneisha agrees to follow up with our clinic in 2 weeks.  Depression Screen Rosalba had a strongly positive depression screening. Depression is commonly associated with obesity and often results in emotional eating behaviors. We will monitor this closely and work on CBT to help improve the non-hunger eating patterns. Referral to Psychology may be required if no improvement is seen as she continues in our clinic.  Obesity Bijou is currently in the action stage of change and her goal is to continue with weight loss efforts She has agreed to follow the Category 2 plan Sheenah has been instructed to work up to a goal of 150 minutes of combined cardio and strengthening exercise per week for weight loss and overall health benefits. We  discussed the following Behavioral Modification Strategies today: increasing lean protein intake, increasing vegetables, work on meal planning and easy cooking plans, and planning for success  Daje has agreed to follow up with our clinic in 2 weeks. She Rebekah [name] informed of the importance of frequent follow up visits to maximize her success with intensive lifestyle modifications for her multiple health conditions. She Rebekah [name] informed we would discuss her lab results at her next visit unless there is a critical issue that needs to be addressed sooner. Simisola agreed to keep her next visit at the agreed upon time to discuss these results.    OBESITY BEHAVIORAL INTERVENTION VISIT  Today's visit Rebekah [name] # 1   Starting weight: 198 lbs Starting date: 07/21/18 Today's weight : 198 lbs  Today's date: 07/21/2018 Total lbs lost to date: 0    ASK: We discussed the diagnosis of obesity with Isaiah Serge today and Kiondra agreed to give [name] permission to discuss obesity behavioral modification therapy today.  ASSESS:  Viki has the diagnosis of obesity and her BMI today is 33.97 Cleda is in the action stage of change   ADVISE: Rhemi Rebekah [name] educated on the multiple health risks of obesity as well as the benefit of weight loss to improve her health. She Rebekah [name] advised of the need for long term treatment and the importance of lifestyle modifications to improve her current health and to decrease her risk of future health problems.  AGREE: Multiple dietary modification options and treatment options were discussed and  Iasia agreed to follow the recommendations documented in the above note.  ARRANGE: Marybel Rebekah [name] educated on the importance of frequent visits to treat obesity as outlined per CMS and USPSTF guidelines and agreed to schedule her next follow up appointment today.   I, Trixie Dredge, am acting as transcriptionist for Ilene Qua, MD    I have reviewed the above documentation for accuracy and  completeness, and I agree with the above. - Ilene Qua, MD

## 2018-07-22 LAB — CBC WITH DIFFERENTIAL
Basophils Absolute: 0.1 10*3/uL (ref 0.0–0.2)
Basos: 1 %
EOS (ABSOLUTE): 0.2 10*3/uL (ref 0.0–0.4)
EOS: 2 %
HEMATOCRIT: 39.1 % (ref 34.0–46.6)
Hemoglobin: 12.8 g/dL (ref 11.1–15.9)
IMMATURE GRANULOCYTES: 0 %
Immature Grans (Abs): 0 10*3/uL (ref 0.0–0.1)
LYMPHS ABS: 3.3 10*3/uL — AB (ref 0.7–3.1)
Lymphs: 37 %
MCH: 27.8 pg (ref 26.6–33.0)
MCHC: 32.7 g/dL (ref 31.5–35.7)
MCV: 85 fL (ref 79–97)
MONOS ABS: 0.5 10*3/uL (ref 0.1–0.9)
Monocytes: 6 %
NEUTROS PCT: 54 %
Neutrophils Absolute: 4.9 10*3/uL (ref 1.4–7.0)
RBC: 4.6 x10E6/uL (ref 3.77–5.28)
RDW: 14.7 % (ref 12.3–15.4)
WBC: 9 10*3/uL (ref 3.4–10.8)

## 2018-07-22 LAB — COMPREHENSIVE METABOLIC PANEL
A/G RATIO: 1.5 (ref 1.2–2.2)
ALBUMIN: 4.5 g/dL (ref 3.5–5.5)
ALK PHOS: 87 IU/L (ref 39–117)
ALT: 24 IU/L (ref 0–32)
AST: 24 IU/L (ref 0–40)
BILIRUBIN TOTAL: 0.3 mg/dL (ref 0.0–1.2)
BUN / CREAT RATIO: 12 (ref 9–23)
BUN: 10 mg/dL (ref 6–24)
CHLORIDE: 102 mmol/L (ref 96–106)
CO2: 23 mmol/L (ref 20–29)
Calcium: 9.7 mg/dL (ref 8.7–10.2)
Creatinine, Ser: 0.83 mg/dL (ref 0.57–1.00)
GFR calc non Af Amer: 80 mL/min/{1.73_m2} (ref >59)
GFR, EST AFRICAN AMERICAN: 92 mL/min/{1.73_m2} (ref >59)
GLOBULIN, TOTAL: 3 g/dL (ref 1.5–4.5)
GLUCOSE: 88 mg/dL (ref 65–99)
Potassium: 4.6 mmol/L (ref 3.5–5.2)
SODIUM: 141 mmol/L (ref 134–144)
TOTAL PROTEIN: 7.5 g/dL (ref 6.0–8.5)

## 2018-07-22 LAB — T3: T3, Total: 128 ng/dL (ref 71–180)

## 2018-07-22 LAB — T4, FREE: Free T4: 0.85 ng/dL (ref 0.82–1.77)

## 2018-07-22 LAB — VITAMIN D 25 HYDROXY (VIT D DEFICIENCY, FRACTURES): Vit D, 25-Hydroxy: 31.4 ng/mL (ref 30.0–100.0)

## 2018-07-22 LAB — VITAMIN B12: Vitamin B-12: 650 pg/mL (ref 232–1245)

## 2018-07-22 LAB — FOLATE

## 2018-07-22 LAB — TSH: TSH: 3.13 u[IU]/mL (ref 0.450–4.500)

## 2018-07-22 LAB — INSULIN, RANDOM: INSULIN: 38.3 u[IU]/mL — ABNORMAL HIGH (ref 2.6–24.9)
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
## 2018-08-05 ENCOUNTER — Ambulatory Visit (INDEPENDENT_AMBULATORY_CARE_PROVIDER_SITE_OTHER): Payer: 59 | Admitting: Family Medicine

## 2018-08-05 ENCOUNTER — Ambulatory Visit (INDEPENDENT_AMBULATORY_CARE_PROVIDER_SITE_OTHER): Payer: 59 | Admitting: Psychology

## 2018-08-05 VITALS — BP 127/87 | HR 72 | Temp 97.8°F | Ht 64.0 in | Wt 191.0 lb

## 2018-08-05 DIAGNOSIS — R7303 Prediabetes: Secondary | ICD-10-CM

## 2018-08-05 DIAGNOSIS — Z9189 Other specified personal risk factors, not elsewhere classified: Secondary | ICD-10-CM

## 2018-08-05 DIAGNOSIS — E559 Vitamin D deficiency, unspecified: Secondary | ICD-10-CM | POA: Diagnosis not present

## 2018-08-05 DIAGNOSIS — E669 Obesity, unspecified: Secondary | ICD-10-CM | POA: Diagnosis not present

## 2018-08-05 DIAGNOSIS — Z6832 Body mass index (BMI) 32.0-32.9, adult: Secondary | ICD-10-CM

## 2018-08-05 DIAGNOSIS — F3289 Other specified depressive episodes: Secondary | ICD-10-CM | POA: Diagnosis not present

## 2018-08-05 DIAGNOSIS — F332 Major depressive disorder, recurrent severe without psychotic features: Secondary | ICD-10-CM | POA: Diagnosis not present

## 2018-08-05 MED ORDER — VITAMIN D (ERGOCALCIFEROL) 1.25 MG (50000 UNIT) PO CAPS: 50000.0000 [IU] | ORAL_CAPSULE | ORAL | 0 refills | Status: DC

## 2018-08-05 MED ORDER — METFORMIN HCL 500 MG PO TABS: 500.0000 mg | ORAL_TABLET | Freq: Every day | ORAL | 0 refills | Status: DC

## 2018-08-07 ENCOUNTER — Encounter (INDEPENDENT_AMBULATORY_CARE_PROVIDER_SITE_OTHER): Payer: Self-pay | Admitting: Family Medicine

## 2018-08-07 NOTE — Progress Notes (Signed)
Office: [phone]  /  Fax: [phone]   HPI:   Chief Complaint: OBESITY Rebekah [name] is here to discuss her progress with her obesity treatment plan. She is on the Category 2 plan and is following her eating plan approximately 87 % of the time. She states she is exercising 0 minutes 0 times per week. Rebekah [name] notes hunger most of the day, 3 hours after breakfast, lunch and dinner. She liked the food on th plan. Her weight is 191 lb (86.6 kg) today and has had a weight loss of 7 pounds over a period of 2 weeks since her last visit. She has lost 7 lbs since starting treatment with [name].  Vitamin D Deficiency Rebekah [name] has a diagnosis of vitamin D deficiency. She is not currently taking Vit D. She notes fatigue and denies nausea, vomiting or muscle weakness.  Pre-Diabetes Rebekah [name] has a diagnosis of pre-diabetes based on her elevated Hgb A1c and was informed this puts her at greater risk of developing diabetes. Hgb A1c at 6.3 and insulin at 38.3, side effects were discussed. She is not taking metformin currently and continues to work on diet and exercise to decrease risk of diabetes. She denies nausea or hypoglycemia.  At risk for diabetes Rebekah [name] is at higher than average risk for developing diabetes due to her obesity and pre-diabetes. She currently denies polyuria or polydipsia.  Depression with emotional eating behaviors Rebekah [name] voices some quilt with her father dying. She does verbalize that she previously had concern about self-harm but vehemently denies any self-harm behavior or plan to self-harm. Rebekah [name] struggles with emotional eating and using food for comfort to the extent that it is negatively impacting her health. She often snacks when she is not hungry. Rebekah [name] sometimes feels she is out of control and then feels guilty that she made poor food choices. She has been working on behavior modification techniques to help reduce her emotional eating and has been somewhat successful. She shows no sign of  suicidal or homicidal ideations.  Depression screen [facility] 2/9 08/05/2018 07/21/2018 06/05/2017 09/23/2015  Decreased Interest 2 3 1  0  Down, Depressed, Hopeless 1 3 1  0  PHQ - 2 Score 3 6 2  0  Altered sleeping 2 3 0 -  Tired, decreased energy 2 3 0 -  Change in appetite 1 3 0 -  Feeling bad or failure about yourself  2 3 0 -  Trouble concentrating 2 1 0 -  Moving slowly or fidgety/restless 2 - 0 -  Suicidal thoughts 0 3 0 -  PHQ-9 Score 14 22 2  -  Difficult doing work/chores - Very difficult - -    ALLERGIES: Allergies  Allergen Reactions  . Penicillins Hives and Rash    Has patient had a PCN reaction causing immediate rash, facial/tongue/throat swelling, SOB or lightheadedness with hypotension: Yes Has patient had a PCN reaction causing severe rash involving mucus membranes or skin necrosis: Yes Has patient had a PCN reaction that required hospitalization: No Has patient had a PCN reaction occurring within the last 10 years: No If all of the above answers are "NO", then may proceed with Cephalosporin use.   . Erythromycin Nausea Only    Gastrointestinal issues.    MEDICATIONS: Current Outpatient Medications on File Prior to Visit  Medication Sig Dispense Refill  . aspirin 81 MG tablet Take 1 tablet (81 mg total) by mouth daily. 90 tablet 3  . Azelaic Acid (FINACEA) 15 % FOAM Apply 1 application topically 2 (two) times daily as  needed.     Marland Kitchen DIVIGEL 1 MG/GM GEL Take 1 mg by mouth daily.     Marland Kitchen L-Methylfolate-Algae (DEPLIN 7.5) 7.5-90.314 MG CAPS Take 1 capsule by mouth daily.     Marland Kitchen lisinopril (PRINIVIL,ZESTRIL) 10 MG tablet Take 1 tablet (10 mg total) by mouth daily. 90 tablet 1  . Multiple Vitamin (MULTIVITAMIN) tablet Take 1 tablet by mouth daily. 90 tablet 1  . nortriptyline (PAMELOR) 75 MG capsule Take 1 capsule (75 mg total) by mouth daily. 90 capsule 3  . omeprazole (PRILOSEC) 40 MG capsule Take 1 capsule (40 mg total) by mouth daily. 90 capsule 1  . Probiotic Product  (PROBIOTIC-10) CHEW Chew 1 tablet by mouth daily.     . progesterone (PROMETRIUM) 100 MG capsule Take 100 mg by mouth daily.    . ranitidine (ZANTAC) 150 MG tablet Take 150 mg by mouth 2 (two) times daily.    . rosuvastatin (CRESTOR) 10 MG tablet Take 1 tablet (10 mg total) by mouth at bedtime. 90 tablet 1   No current facility-administered medications on file prior to visit.     PAST MEDICAL HISTORY: Past Medical History:  Diagnosis Date  . Allergy   . Back pain   . Bowel obstruction ([place]) 2019  . Depression   . Gall bladder stones   . GERD (gastroesophageal reflux disease)   . Hyperlipidemia   . Hypertension   . IBS (irritable bowel syndrome)   . IBS (irritable bowel syndrome)   . Joint pain   . Obesity    Lap band surgery 12-08 Dr Hassell Done  . OSA (obstructive sleep apnea)    using a CPAP    . Pre-diabetes     PAST SURGICAL HISTORY: Past Surgical History:  Procedure Laterality Date  . APPENDECTOMY    . BREAST REDUCTION SURGERY    . CHOLECYSTECTOMY    . CLOSED REDUCTION FINGER WITH PERCUTANEOUS PINNING Left 09/14/2014   Procedure: CLOSED REDUCTION PERCUTANEOUS PINNING ;  Surgeon: Leanora Cover, MD;  Location: [place];  Service: Orthopedics;  Laterality: Left;  . ESOPHAGEAL MANOMETRY N/A 09/23/2017   Procedure: ESOPHAGEAL MANOMETRY (EM);  Surgeon: Mauri Pole, MD;  Location: [facility];  Service: Endoscopy;  Laterality: N/A;  . Lap Band Adjustment  09/01/2015   Dr. Redmond Pulling  . LAPAROSCOPIC GASTRIC BANDING  09/2007   Lap band surgery 12-08 Dr Hassell Done  . NASAL SINUS SURGERY    . TONSILLECTOMY     x2    SOCIAL HISTORY: Social History   Tobacco Use  . Smoking status: Never Smoker  . Smokeless tobacco: Never Used  Substance Use Topics  . Alcohol use: Yes    Comment: socially   . Drug use: No    FAMILY HISTORY: Family History  Problem Relation Age of Onset  . Coronary artery disease Father        CABG, smoker .age onset 65s  . Prostate  cancer Father   . Pulmonary fibrosis Father        and others  . High blood pressure Father   . High Cholesterol Father   . Depression Father   . Obesity Father   . Breast cancer Mother        age 38  . Heart disease Mother        CABG, smoker .age onset 31s  . Diverticulitis Mother   . High blood pressure Mother   . High Cholesterol Mother   . Thyroid disease Mother   . Depression  Mother   . Obesity Mother   . Heart attack Brother        age 38  . Diabetes Neg Hx   . Colon cancer Neg Hx   . Stomach cancer Neg Hx   . Esophageal cancer Neg Hx   . Rectal cancer Neg Hx     ROS: Review of Systems  Constitutional: Positive for malaise/fatigue and weight loss.  Gastrointestinal: Negative for nausea and vomiting.  Genitourinary: Negative for frequency.  Musculoskeletal:       Negative muscle weakness  Endo/Heme/Allergies: Negative for polydipsia.       Negative hypoglycemia  Psychiatric/Behavioral: Positive for depression. Negative for suicidal ideas.    PHYSICAL EXAM: Blood pressure 127/87, pulse 72, temperature 97.8 F (36.6 C), temperature source Oral, height 5\' 4"  (1.626 m), weight 191 lb (86.6 kg), SpO2 99 %. Body mass index is 32.79 kg/m. Physical Exam  Constitutional: She is oriented to person, place, and time. She appears well-developed and well-nourished.  Cardiovascular: Normal rate.  Pulmonary/Chest: Effort normal.  Musculoskeletal: Normal range of motion.  Neurological: She is oriented to person, place, and time.  Skin: Skin is warm and dry.  Psychiatric: She has a normal mood and affect. Her behavior is normal.  Vitals reviewed.   RECENT LABS AND TESTS: BMET    Component Value Date/Time   NA 141 07/21/2018 1143   K 4.6 07/21/2018 1143   CL 102 07/21/2018 1143   CO2 23 07/21/2018 1143   GLUCOSE 88 07/21/2018 1143   GLUCOSE 74 03/04/2018 0852   GLUCOSE 95 08/24/2010 1048   BUN 10 07/21/2018 1143   CREATININE 0.83 07/21/2018 1143   CALCIUM 9.7  07/21/2018 1143   GFRNONAA 80 07/21/2018 1143   GFRAA 92 07/21/2018 1143   Lab Results  Component Value Date   HGBA1C 6.3 07/10/2018   HGBA1C 6.2 03/04/2018   HGBA1C 6.1 06/05/2017   HGBA1C 6.0 06/14/2016   HGBA1C 6.0 09/23/2015   Lab Results  Component Value Date   INSULIN 38.3 (H) 07/21/2018   CBC    Component Value Date/Time   WBC 9.0 07/21/2018 1143   WBC 8.7 10/16/2017 1140   RBC 4.60 07/21/2018 1143   RBC 4.58 10/16/2017 1140   HGB 12.8 07/21/2018 1143   HCT 39.1 07/21/2018 1143   PLT 325.0 10/16/2017 1140   MCV 85 07/21/2018 1143   MCH 27.8 07/21/2018 1143   MCH 28.0 09/30/2017 0453   MCHC 32.7 07/21/2018 1143   MCHC 32.7 10/16/2017 1140   RDW 14.7 07/21/2018 1143   LYMPHSABS 3.3 (H) 07/21/2018 1143   MONOABS 0.5 10/16/2017 1140   EOSABS 0.2 07/21/2018 1143   BASOSABS 0.1 07/21/2018 1143   Iron/TIBC/Ferritin/ %Sat    Component Value Date/Time   IRON 86 01/07/2015 0902   FERRITIN 19.1 01/07/2015 0902   IRONPCTSAT 23.2 07/30/2008 0856   Lipid Panel     Component Value Date/Time   CHOL 117 07/10/2018 0819   TRIG 77.0 07/10/2018 0819   TRIG 99 08/30/2006 0904   HDL 48.00 07/10/2018 0819   CHOLHDL 2 07/10/2018 0819   VLDL 15.4 07/10/2018 0819   LDLCALC 54 07/10/2018 0819   Hepatic Function Panel     Component Value Date/Time   PROT 7.5 07/21/2018 1143   ALBUMIN 4.5 07/21/2018 1143   AST 24 07/21/2018 1143   ALT 24 07/21/2018 1143   ALKPHOS 87 07/21/2018 1143   BILITOT 0.3 07/21/2018 1143   BILIDIR 0.0 05/15/2012 1453  Component Value Date/Time   TSH 3.130 07/21/2018 1143   TSH 4.65 (H) 07/10/2018 0819   TSH 3.02 06/05/2017 0840   Results for AURIAH, HOLLINGS (MRN [id number]) as of 08/07/2018 07:27  Ref. Range 07/21/2018 11:43  Vitamin D, 25-Hydroxy Latest Ref Range: 30.0 - 100.0 ng/mL 31.4   ASSESSMENT AND PLAN: Vitamin D deficiency - Plan: Vitamin D, Ergocalciferol, (DRISDOL) 50000 units CAPS capsule  Prediabetes - Plan: metFORMIN  (GLUCOPHAGE) 500 MG tablet  At risk for diabetes mellitus  Other depression - with emotional eating  Class 1 obesity with serious comorbidity and body mass index (BMI) of 32.0 to 32.9 in adult, unspecified obesity type  PLAN:  Vitamin D Deficiency Georgene was informed that low vitamin D levels contributes to fatigue and are associated with obesity, breast, and colon cancer. Teryl agrees to start prescription Vit D @50 ,000 IU every week #4 with no refills. She will follow up for routine testing of vitamin D, at least 2-3 times per year. She was informed of the risk of over-replacement of vitamin D and agrees to not increase her dose unless she discusses this with [name] first. Leonilda agrees to follow up with our clinic in 2 weeks.  Pre-Diabetes Demita will continue to work on weight loss, exercise, and decreasing simple carbohydrates in her diet to help decrease the risk of diabetes. We dicussed metformin including benefits and risks. She was informed that eating too many simple carbohydrates or too many calories at one sitting increases the likelihood of GI side effects. Ravynn agrees to start metformin 500 mg PO q AM #30 with no refills. Maranda agrees to follow up with our clinic in 2 weeks as directed to monitor her progress.  Diabetes risk counselling Tamrah was given extended (30 minutes) diabetes prevention counseling today. She is 57 y.o. female and has risk factors for diabetes including obesity and pre-diabetes. We discussed intensive lifestyle modifications today with an emphasis on weight loss as well as increasing exercise and decreasing simple carbohydrates in her diet.  Depression with Emotional Eating Behaviors We discussed behavior modification techniques today to help Shalese deal with her emotional eating and depression. Skya is to continue to see Dr. Mallie Mussel, our bariatric psychologist, but was referred out for therapy which she states she is not ready for. We will follow up at next  appointment. Brynlie agrees to follow up with our clinic in 2 weeks.  Obesity Sadiyah is currently in the action stage of change. As such, her goal is to continue with weight loss efforts She has agreed to follow the Category 2 plan Lasundra has been instructed to work up to a goal of 150 minutes of combined cardio and strengthening exercise per week for weight loss and overall health benefits. We discussed the following Behavioral Modification Strategies today: increasing lean protein intake, increasing vegetables, work on meal planning and easy cooking plans, better snacking choices, and planning for success   Desarai has agreed to follow up with our clinic in 2 weeks. She was informed of the importance of frequent follow up visits to maximize her success with intensive lifestyle modifications for her multiple health conditions.   OBESITY BEHAVIORAL INTERVENTION VISIT  Today's visit was # 2   Starting weight: 198 lbs Starting date: 07/21/18 Today's weight : 191 lbs  Today's date: 08/05/2018 Total lbs lost to date: 7    ASK: We discussed the diagnosis of obesity with Isaiah Serge today and Tyreisha agreed to give [name] permission to discuss obesity behavioral  modification therapy today.  ASSESS: Idil has the diagnosis of obesity and her BMI today is 32.77 Darielle is in the action stage of change   ADVISE: Alexyss was educated on the multiple health risks of obesity as well as the benefit of weight loss to improve her health. She was advised of the need for long term treatment and the importance of lifestyle modifications to improve her current health and to decrease her risk of future health problems.  AGREE: Multiple dietary modification options and treatment options were discussed and  Shakiara agreed to follow the recommendations documented in the above note.  ARRANGE: Ladana was educated on the importance of frequent visits to treat obesity as outlined per CMS and USPSTF guidelines and agreed  to schedule her next follow up appointment today.  I, Trixie Dredge, am acting as transcriptionist for Ilene Qua, MD  I have reviewed the above documentation for accuracy and completeness, and I agree with the above. - Ilene Qua, MD

## 2018-08-18 NOTE — Progress Notes (Signed)
Office: [phone]  /  Fax: [phone]   Date: August 20, 2018 Time Seen: 4:00pm Duration: 32 minutes Provider: Glennie [name], Psy.D. Type of Session: Individual Therapy   HPI: Rebekah [name] was referred by Dr. Ilene [name] due to depression with emotional eating behaviors. Rebekah was seen for an initial appointment by this provider on August 05, 2018. Per the note for the initial visit with Dr. Ilene [name] on July 21, 2018, "Rebekah [name], Rebekah [name], and Rebekah [name] been [name] on behavior modification techniques to help reduce heremotional eating and has been somewhat successful.Sheshows no sign of suicidal or homicidal ideations." Rebekah [name]'s Food and Mood (modified PHQ-9) score was22. In addition, per the note for the initial visit with Dr. Ilene [name] on July 21, 2018, Rebekah [name] has been heavy most of her life and Rebekah started gaining weight at the age of 51. Her heaviest weight ever was 232 pounds. During her initial appointment with Dr. Adair [name], Rebekah [name] reported experiencing the following: significant food craving issues; snacking frequently in the evenings; trying to eat vegetarian; trying to eat vegan; frequently drinking liquids with calories; frequently making poor food choices; frequently eating larger portions than normal; and struggling with emotional eating. During the initial appointment with this provider, Rebekah [name] explained that during her first appointment with Dr. Adair [name], Rebekah described not having much support. Rebekah stated, "My father says if you are fat, you are stupid." In addition, Rebekah [name] indicated her father has approximately two months to live. Rebekah [name] stated her last episode  of emotional eating was last Thursday. Rebekah added, "Night is my weak point." Rebekah [name] stated Rebekah ended up consuming two diet popsicles and a "big handful of nuts." Furthermore, Rebekah shared, "I love to cook." Rebekah added that Rebekah brought a recipe book for soups to share with the dietician today during her appointment after meeting with this provider. Rebekah [name] reported a history of binge eating. Rebekah noted the last time was approximately one month ago. Rebekah explained her husband, a Agricultural consultant, was gone and Rebekah ate something "every 10 minutes." In addition, Rebekah [name] stated binge and emotional eating started at the age 31. Rebekah could not identify any significant events at that age. However, Rebekah noted Rebekah could have "anything" Rebekah wanted when Rebekah saw her grandparents. Regarding purging, Rebekah [name] stated it started in her 73s and the last time was approximately 10 years ago. Rebekah denied engagement in any other compensatory strategies. 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  Furthermore, Rebekah [name] was asked to complete a questionnaire assessing various behaviors related to emotional eating. Rebekah [name] endorsed the following: overeat when you are celebrating, experience food cravings on a regular basis, eat certain foods when you are anxious, stressed, depressed, or your feelings are hurt, use food to help you cope with emotional situations, find food is comforting to you, overeat when you are angry or upset, overeat when you are worried about something, overeat frequently when you are bored or lonely, overeat when you are alone, but eat much less when you are with other people and eat as a reward.  Session Content: Session focused on the following treatment goal: decrease emotional eating. The session was initiated with the administration of the PHQ-9 and GAD-7, as well as a brief check-in. This provider  also completed a risk assessment. Rebekah [name] denied suicidal and homicidal ideation, plan, and intent since the last appointment with  this provider. Rebekah indicated Rebekah still has her safety plan. Rebekah [name] discussed her birthday trip and noted, "It was nice." Rebekah was observed smiling and laughing as Rebekah recalled the details of her trip. Rebekah noted Rebekah and her husband plan to take another trip in December. Regarding eating, Rebekah indicated it is going "pretty well." Rebekah reported Rebekah ate out during her vacation and enjoyed a slice of birthday cake. Regarding emotional versus physical hunger, Rebekah [name] acknowledged engaging in emotional eating at night after 7:30 PM. Moreover, Rebekah indicated Rebekah started her Metformin prescription. Rebekah indicated her husband is gravitating towards a more plant-based diet; therefore, Rebekah wishes to explore options so that Rebekah does not have to cook two meals. As such, this provider recommended Rebekah discuss this further with Dr. Adair [name] at their appointment tomorrow morning. Rebekah [name] agreed. Rebekah [name] of session focused on triggers for emotional eating. Psychoeducation regarding triggers for emotional eating was provided. [name] was provided a handout, and encouraged to utilize the handout between now and the next appointment to increase awareness of triggers and frequency. Rebekah [name] agreed. Overall, Rebekah [name] was receptive to today's appointment as evidenced by her openness to sharing, responsiveness to feedback, and willingness to explore hunger patterns as well as triggers for emotional eating.  Mental Status Examination: Rebekah [name] arrived early for the appointment. Rebekah presented as appropriately dressed and groomed. Rebekah [name] appeared her stated age and demonstrated adequate orientation to time, place, person, and purpose of the appointment. Rebekah also demonstrated appropriate eye contact. No psychomotor abnormalities or behavioral peculiarities noted. Her mood was euthymic with congruent affect. Her thought processes were logical, linear, and goal-directed. No hallucinations, delusions, bizarre thinking or behavior reported or observed. Judgment,  insight, and impulse control appeared to be grossly intact. There was no evidence of paraphasias (i.e., errors in speech, gross mispronunciations, and word substitutions), repetition deficits, or disturbances in volume or prosody (i.e., rhythm and intonation). There was no evidence of attention or memory impairments. Magdelena denied current suicidal and homicidal ideation, intent or plan.  Structured Assessment Results: The Patient Health Questionnaire-9 (PHQ-9) is a self-report measure that assesses symptoms and severity of depression over the course of the last two weeks. Beatris obtained a score of eight suggesting mild depression. Carey finds the endorsed symptoms to be somewhat difficult. Depression screen PHQ 2/9 08/20/2018  Decreased Interest 1  Down, Depressed, Hopeless 1  PHQ - 2 Score 2  Altered sleeping 1  Tired, decreased energy 2  Change in appetite 1  Feeling bad or failure about yourself  1  Trouble concentrating 1  Moving slowly or fidgety/restless 0  Suicidal thoughts 0  PHQ-9 Score 8  Difficult doing work/chores -   The Generalized Anxiety Disorder-7 (GAD-7) is a brief self-report measure that assesses symptoms of anxiety over the course of the last two weeks. Payzlee obtained a score of seven suggesting mild anxiety. GAD 7 : Generalized Anxiety Score 08/20/2018  Nervous, Anxious, on Edge 1  Control/stop worrying 1  Worry too much - different things 1  Trouble relaxing 1  Restless 0  Easily annoyed or irritable 2  Afraid - awful might happen 1  Total GAD 7 Score 7  Anxiety Difficulty Somewhat difficult   Interventions: Xzandria was administered the PHQ-9 and GAD-7 for symptom monitoring. Content from the last session was reviewed and this provider assessed for safety. Throughout today's session, empathic reflections and validation were  provided. Psychoeducation regarding triggers for emotional eating was provided.  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 Tayia has demonstrated progress in her goal of decreasing emotional eating as evidenced by increased awareness of physical versus emotional hunger. In addition, Rebekah was receptive to identifying her triggers for emotional eating.  Plan: Inara continues to appear able and willing to participate as evidenced by engagement in reciprocal conversation, and asking questions for clarification as appropriate. The next appointment will be scheduled in two weeks. The next session will focus on reviewing triggers for emotional eating and the introduction of thought defusion.

## 2018-08-20 ENCOUNTER — Ambulatory Visit (INDEPENDENT_AMBULATORY_CARE_PROVIDER_SITE_OTHER): Payer: 59 | Admitting: Psychology

## 2018-08-20 DIAGNOSIS — F332 Major depressive disorder, recurrent severe without psychotic features: Secondary | ICD-10-CM | POA: Diagnosis not present

## 2018-08-21 ENCOUNTER — Ambulatory Visit (INDEPENDENT_AMBULATORY_CARE_PROVIDER_SITE_OTHER): Payer: 59 | Admitting: Family Medicine

## 2018-08-21 VITALS — BP 120/81 | HR 75 | Temp 98.4°F | Ht 64.0 in | Wt 187.0 lb

## 2018-08-21 DIAGNOSIS — R7303 Prediabetes: Secondary | ICD-10-CM

## 2018-08-21 DIAGNOSIS — Z6832 Body mass index (BMI) 32.0-32.9, adult: Secondary | ICD-10-CM | POA: Diagnosis not present

## 2018-08-21 DIAGNOSIS — I10 Essential (primary) hypertension: Secondary | ICD-10-CM | POA: Diagnosis not present

## 2018-08-21 DIAGNOSIS — E669 Obesity, unspecified: Secondary | ICD-10-CM | POA: Diagnosis not present

## 2018-08-21 NOTE — Progress Notes (Signed)
Office: [phone]  /  Fax: [phone]   HPI:   Chief Complaint: OBESITY Rebekah [name] is here to discuss her progress with her obesity treatment plan. She is on the  follow the Category 2 plan and is following her eating plan approximately 80 % of the time. She states she is exercising 0 minutes 0 times per week. Trella recently returned from a trip and did some hiking. She tried to make good choices in indulging for her birthday. She is getting hungry at night after she relaxes for the night.   Her weight is 187 lb (84.8 kg) today and has had a weight loss of 4 pounds over a period of 2 weeks since her last visit. She has lost 11 lbs since starting treatment with [name].  Pre-Diabetes Rebekah [name] has a diagnosis of prediabetes based on her elevated HgA1c and was informed this puts her at greater risk of developing diabetes. She is taking metformin currently and continues to work on diet and exercise to decrease risk of diabetes. She denies nausea, cravings, or hypoglycemia.  Hypertension Rebekah [name] is a 57 y.o. female with hypertension.  Rebekah [name] denies chest pain/pressure, headaches, or shortness of breath on exertion. She is working weight loss to help control her blood pressure with the goal of decreasing her risk of heart attack and stroke. Tammys blood pressure is currently controlled.     ALLERGIES: Allergies  Allergen Reactions  . Penicillins Hives and Rash    Has patient had a PCN reaction causing immediate rash, facial/tongue/throat swelling, SOB or lightheadedness with hypotension: Yes Has patient had a PCN reaction causing severe rash involving mucus membranes or skin necrosis: Yes Has patient had a PCN reaction that required hospitalization: No Has patient had a PCN reaction occurring within the last 10 years: No If all of the above answers are "NO", then may proceed with Cephalosporin use.   . Erythromycin Nausea Only    Gastrointestinal issues.    MEDICATIONS: Current  Outpatient Medications on File Prior to Visit  Medication Sig Dispense Refill  . aspirin 81 MG tablet Take 1 tablet (81 mg total) by mouth daily. 90 tablet 3  . Azelaic Acid (FINACEA) 15 % FOAM Apply 1 application topically 2 (two) times daily as needed.     Marland Kitchen DIVIGEL 1 MG/GM GEL Take 1 mg by mouth daily.     Marland Kitchen L-Methylfolate-Algae (DEPLIN 7.5) 7.5-90.314 MG CAPS Take 1 capsule by mouth daily.     Marland Kitchen lisinopril (PRINIVIL,ZESTRIL) 10 MG tablet Take 1 tablet (10 mg total) by mouth daily. 90 tablet 1  . metFORMIN (GLUCOPHAGE) 500 MG tablet Take 1 tablet (500 mg total) by mouth daily with breakfast. 30 tablet 0  . Multiple Vitamin (MULTIVITAMIN) tablet Take 1 tablet by mouth daily. 90 tablet 1  . nortriptyline (PAMELOR) 75 MG capsule Take 1 capsule (75 mg total) by mouth daily. 90 capsule 3  . omeprazole (PRILOSEC) 40 MG capsule Take 1 capsule (40 mg total) by mouth daily. 90 capsule 1  . Probiotic Product (PROBIOTIC-10) CHEW Chew 1 tablet by mouth daily.     . progesterone (PROMETRIUM) 100 MG capsule Take 100 mg by mouth daily.    . ranitidine (ZANTAC) 150 MG tablet Take 150 mg by mouth 2 (two) times daily.    . rosuvastatin (CRESTOR) 10 MG tablet Take 1 tablet (10 mg total) by mouth at bedtime. 90 tablet 1  . Vitamin D, Ergocalciferol, (DRISDOL) 50000 units CAPS capsule Take 1 capsule (50,000  Units total) by mouth every 7 (seven) days. 4 capsule 0   No current facility-administered medications on file prior to visit.     PAST MEDICAL HISTORY: Past Medical History:  Diagnosis Date  . Allergy   . Back pain   . Bowel obstruction ([place]) 2019  . Depression   . Gall bladder stones   . GERD (gastroesophageal reflux disease)   . Hyperlipidemia   . Hypertension   . IBS (irritable bowel syndrome)   . IBS (irritable bowel syndrome)   . Joint pain   . Obesity    Lap band surgery 12-08 Dr Hassell Done  . OSA (obstructive sleep apnea)    using a CPAP    . Pre-diabetes     PAST SURGICAL  HISTORY: Past Surgical History:  Procedure Laterality Date  . APPENDECTOMY    . BREAST REDUCTION SURGERY    . CHOLECYSTECTOMY    . CLOSED REDUCTION FINGER WITH PERCUTANEOUS PINNING Left 09/14/2014   Procedure: CLOSED REDUCTION PERCUTANEOUS PINNING ;  Surgeon: Leanora Cover, MD;  Location: [facility];  Service: Orthopedics;  Laterality: Left;  . ESOPHAGEAL MANOMETRY N/A 09/23/2017   Procedure: ESOPHAGEAL MANOMETRY (EM);  Surgeon: Mauri Pole, MD;  Location: [facility];  Service: Endoscopy;  Laterality: N/A;  . Lap Band Adjustment  09/01/2015   Dr. Redmond Pulling  . LAPAROSCOPIC GASTRIC BANDING  09/2007   Lap band surgery 12-08 Dr Hassell Done  . NASAL SINUS SURGERY    . TONSILLECTOMY     x2    SOCIAL HISTORY: Social History   Tobacco Use  . Smoking status: Never Smoker  . Smokeless tobacco: Never Used  Substance Use Topics  . Alcohol use: Yes    Comment: socially   . Drug use: No    FAMILY HISTORY: Family History  Problem Relation Age of Onset  . Coronary artery disease Father        CABG, smoker .age onset 18s  . Prostate cancer Father   . Pulmonary fibrosis Father        and others  . High blood pressure Father   . High Cholesterol Father   . Depression Father   . Obesity Father   . Breast cancer Mother        age 61  . Heart disease Mother        CABG, smoker .age onset 63s  . Diverticulitis Mother   . High blood pressure Mother   . High Cholesterol Mother   . Thyroid disease Mother   . Depression Mother   . Obesity Mother   . Heart attack Brother        age 40  . Diabetes Neg Hx   . Colon cancer Neg Hx   . Stomach cancer Neg Hx   . Esophageal cancer Neg Hx   . Rectal cancer Neg Hx     ROS: Review of Systems  Constitutional: Positive for malaise/fatigue.  Respiratory: Negative for shortness of breath.   Cardiovascular: Negative for chest pain.       Negative for chest pressure  Gastrointestinal: Negative for nausea.  Neurological:  Negative for headaches.  Endo/Heme/Allergies:       Negative for hypoglycemia    PHYSICAL EXAM: Blood pressure 120/81, pulse 75, temperature 98.4 F (36.9 C), temperature source Oral, height 5\' 4"  (1.626 m), weight 187 lb (84.8 kg), SpO2 97 %. Body mass index is 32.1 kg/m. Physical Exam  Constitutional: She is oriented to person, place, and time. She appears well-developed.  HENT:  Head: Normocephalic.  Neck: Normal range of motion.  Cardiovascular: Normal rate.  Pulmonary/Chest: Effort normal.  Musculoskeletal: Normal range of motion.  Neurological: She is alert and oriented to person, place, and time.  Skin: Skin is warm and dry.  Psychiatric: She has a normal mood and affect. Her behavior is normal.  Vitals reviewed.   RECENT LABS AND TESTS: BMET    Component Value Date/Time   NA 141 07/21/2018 1143   K 4.6 07/21/2018 1143   CL 102 07/21/2018 1143   CO2 23 07/21/2018 1143   GLUCOSE 88 07/21/2018 1143   GLUCOSE 74 03/04/2018 0852   GLUCOSE 95 08/24/2010 1048   BUN 10 07/21/2018 1143   CREATININE 0.83 07/21/2018 1143   CALCIUM 9.7 07/21/2018 1143   GFRNONAA 80 07/21/2018 1143   GFRAA 92 07/21/2018 1143   Lab Results  Component Value Date   HGBA1C 6.3 07/10/2018   HGBA1C 6.2 03/04/2018   HGBA1C 6.1 06/05/2017   HGBA1C 6.0 06/14/2016   HGBA1C 6.0 09/23/2015   Lab Results  Component Value Date   INSULIN 38.3 (H) 07/21/2018   CBC    Component Value Date/Time   WBC 9.0 07/21/2018 1143   WBC 8.7 10/16/2017 1140   RBC 4.60 07/21/2018 1143   RBC 4.58 10/16/2017 1140   HGB 12.8 07/21/2018 1143   HCT 39.1 07/21/2018 1143   PLT 325.0 10/16/2017 1140   MCV 85 07/21/2018 1143   MCH 27.8 07/21/2018 1143   MCH 28.0 09/30/2017 0453   MCHC 32.7 07/21/2018 1143   MCHC 32.7 10/16/2017 1140   RDW 14.7 07/21/2018 1143   LYMPHSABS 3.3 (H) 07/21/2018 1143   MONOABS 0.5 10/16/2017 1140   EOSABS 0.2 07/21/2018 1143   BASOSABS 0.1 07/21/2018 1143   Iron/TIBC/Ferritin/  %Sat    Component Value Date/Time   IRON 86 01/07/2015 0902   FERRITIN 19.1 01/07/2015 0902   IRONPCTSAT 23.2 07/30/2008 0856   Lipid Panel     Component Value Date/Time   CHOL 117 07/10/2018 0819   TRIG 77.0 07/10/2018 0819   TRIG 99 08/30/2006 0904   HDL 48.00 07/10/2018 0819   CHOLHDL 2 07/10/2018 0819   VLDL 15.4 07/10/2018 0819   LDLCALC 54 07/10/2018 0819   Hepatic Function Panel     Component Value Date/Time   PROT 7.5 07/21/2018 1143   ALBUMIN 4.5 07/21/2018 1143   AST 24 07/21/2018 1143   ALT 24 07/21/2018 1143   ALKPHOS 87 07/21/2018 1143   BILITOT 0.3 07/21/2018 1143   BILIDIR 0.0 05/15/2012 1453      Component Value Date/Time   TSH 3.130 07/21/2018 1143   TSH 4.65 (H) 07/10/2018 0819   TSH 3.02 06/05/2017 0840    ASSESSMENT AND PLAN: Prediabetes  Essential hypertension  Class 1 obesity with serious comorbidity and body mass index (BMI) of 32.0 to 32.9 in adult, unspecified obesity type  PLAN: Pre-Diabetes Avonna will continue to work on weight loss, exercise, and decreasing simple carbohydrates in her diet to help decrease the risk of diabetes. We dicussed metformin including benefits and risks. She was informed that eating too many simple carbohydrates or too many calories at one sitting increases the likelihood of GI side effects. [name] agrees to continue Metformin as prescribed. Isma agreed to follow up with [name] as directed to monitor her progress.  Hypertension We discussed sodium restriction, working on healthy weight loss, and a regular exercise program as the means to achieve improved blood pressure control. Erial agreed  with this plan and agreed to follow up as directed. We will continue to monitor her blood pressure as well as her progress with the above lifestyle modifications. She will continue her medications as prescribed and will watch for signs of hypotension as she continues her lifestyle modifications.  I spent > than 50% of the 15 minute  visit on counseling as documented in the note.  Obesity Rebekah [name] is currently in the action stage of change. As such, her goal is to continue with weight loss efforts She has agreed to keep a food journal with 300-400 calories and 30+ g of protein at supper and Category 2 plan.  Rebekah [name] has been instructed to work up to a goal of 150 minutes of combined cardio and strengthening exercise per week for weight loss and overall health benefits. We discussed the following Behavioral Modification Strategies today: increasing lean protein intake, increasing vegetables, planning for success, and work on meal planning and easy cooking plans.    Rebekah [name] has agreed to follow up with our clinic in 2 weeks. She was informed of the importance of frequent follow up visits to maximize her success with intensive lifestyle modifications for her multiple health conditions.   OBESITY BEHAVIORAL INTERVENTION VISIT  Today's visit was # 3   Starting weight: 198 lb Starting date: 07/21/18 Today's weight :187 lb Today's date: 08/21/2018 Total lbs lost to date: 11 lb    ASK: We discussed the diagnosis of obesity with Rebekah [name] today and Rebekah [name] agreed to give [name] permission to discuss obesity behavioral modification therapy today.  ASSESS: Rebekah [name] has the diagnosis of obesity and her BMI today is 32.08 Rebekah [name] is in the action stage of change   ADVISE: Rebekah [name] was educated on the multiple health risks of obesity as well as the benefit of weight loss to improve her health. She was advised of the need for long term treatment and the importance of lifestyle modifications to improve her current health and to decrease her risk of future health problems.  AGREE: Multiple dietary modification options and treatment options were discussed and  Rebekah [name] agreed to follow the recommendations documented in the above note.  ARRANGE: Zakari was educated on the importance of frequent visits to treat obesity as outlined per CMS and  USPSTF guidelines and agreed to schedule her next follow up appointment today.  I, Renee Ramus, am acting as transcriptionist for Ilene Qua, MD   I have reviewed the above documentation for accuracy and completeness, and I agree with the above. - Ilene Qua, MD

## 2018-08-22 LAB — HM MAMMOGRAPHY

## 2018-08-25 ENCOUNTER — Other Ambulatory Visit (INDEPENDENT_AMBULATORY_CARE_PROVIDER_SITE_OTHER): Payer: Self-pay | Admitting: Family Medicine

## 2018-08-25 DIAGNOSIS — R7303 Prediabetes: Secondary | ICD-10-CM

## 2018-08-25 DIAGNOSIS — E559 Vitamin D deficiency, unspecified: Secondary | ICD-10-CM

## 2018-08-26 ENCOUNTER — Encounter: Payer: Self-pay | Admitting: Internal Medicine

## 2018-08-26 MED ORDER — METFORMIN HCL 500 MG PO TABS
500.0000 mg | ORAL_TABLET | Freq: Every day | ORAL | 0 refills | Status: DC
Start: 2018-08-26 — End: 2018-09-23

## 2018-09-04 ENCOUNTER — Other Ambulatory Visit: Payer: Self-pay | Admitting: Obstetrics and Gynecology

## 2018-09-04 ENCOUNTER — Encounter: Payer: Self-pay | Admitting: Internal Medicine

## 2018-09-04 DIAGNOSIS — Z780 Asymptomatic menopausal state: Secondary | ICD-10-CM

## 2018-09-04 DIAGNOSIS — E2839 Other primary ovarian failure: Secondary | ICD-10-CM

## 2018-09-04 DIAGNOSIS — J84112 Idiopathic pulmonary fibrosis: Secondary | ICD-10-CM

## 2018-09-08 ENCOUNTER — Ambulatory Visit
Admission: RE | Admit: 2018-09-08 | Discharge: 2018-09-08 | Disposition: A | Discharge status: Home / Self Care | Payer: 59 | Source: Ambulatory Visit | Attending: Obstetrics and Gynecology | Admitting: Obstetrics and Gynecology

## 2018-09-08 DIAGNOSIS — E2839 Other primary ovarian failure: Secondary | ICD-10-CM

## 2018-09-08 DIAGNOSIS — Z780 Asymptomatic menopausal state: Secondary | ICD-10-CM

## 2018-09-09 NOTE — Progress Notes (Unsigned)
Office: [phone]  /  Fax: [phone]   Date: September 10, 2018   Time Seen:*** Duration:*** Provider: Glennie Isle, Psy.D. Type of Session: Individual Therapy   HPI: Rebekah [name] referred by Dr. Brent Bulla to depression with emotional eating behaviors. She was seen for an initial appointment by this provider on August 05, 2018. Per the note for the initial visit withDr. Conrad Mountain [name] July 21, 2018,"[name] a Psychiatrist, and she notes a lot of family triggers. Tammystruggleswith emotional eating and using food for comfort to the extent that it is negatively impacting herhealth. Sheoften snacks when sheis not hungry. Tammysometimes feels sheis out of control and then feels guilty that shemade poor food choices. Rebekah [name] been working on behavior modification techniques to help reduce heremotional eating and has been somewhat successful.Sheshows no sign of suicidal or homicidal ideations."Rebekah [name] Food and Mood (modified PHQ-9) score was22. In addition, per the note for the initial visit withDr. Conrad Johnson [name] July 21, 2018,Rebekah [name] has been heavy most of her life and she started gaining weight at the age 17. Her heaviest weight ever was 232 pounds. During her initial appointment with Dr. Adair Patter, Rebekah [name] reported experiencing the following:significant food craving issues; snacking frequently in the evenings; trying to eat vegetarian; trying to eat vegan; frequently drinking liquids with calories; frequently making poor food choices; frequently eating larger portions than normal; and struggling with emotional eating.During the initial appointment with this provider, Rebekah [name] explained that during her first appointment with Rebekah [name] described not having much support. She stated, "My father says if you are fat, you are stupid." In addition, Rebekah [name] indicated her father has approximately two months to live. Rebekah [name] stated her last episode of  emotional eating was last Thursday. She added, "Night is my weak point." Rebekah [name] stated she ended up consuming two diet popsicles and a "big handful of nuts."Furthermore, she shared, "I love to cook." She added that she brought a recipe book for soups to share with the dietician today during her appointment after meeting with this provider. Rebekah [name] reported a history of binge eating. She noted the last time was approximately onemonthago. She explained her husband, a Agricultural consultant, was gone and she ate something "every 10 minutes." In addition, Rebekah [name] stated binge and emotional eating started at the age 57. She could not identify any significant eventsat that age. However, she noted she could have "anything" she wanted when she saw her grandparents. Regarding purging, Rebekah [name] stated it started in her 105s and the last time was approximately 10 years ago. She denied engagement in any other compensatory strategies. 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 Furthermore, Rebekah [name] asked to complete a questionnaire assessing various behaviors related to emotional eating. Rebekah [name] the following: overeat when you are celebrating, experience food cravings on a regular basis, eat certain foods when you are anxious, stressed, depressed, or your feelings are hurt, use food to help you cope with emotional situations, find food is comforting to you, overeat when you are angry or upset, overeat when you are worried about something, overeat frequently when you are bored or lonely, overeat when you are alone, but eat much less when you are with other people and eat as a reward.  Session Content: Session focused on the following treatment goal: decrease emotional eating. The session was initiated with the administration of the PHQ-9 and GAD-7, as well as a brief check-in.  This provider also completed a risk assessment.    Rebekah [name] was receptive to today's session as evidenced by ***.  Mental  Status Examination:  Rebekah [name] arrived on time for the appointment. She presented as appropriately dressed and groomed. Rebekah [name] appeared her stated age and demonstrated adequate orientation to time, place, person, and purpose of the appointment. She also demonstrated appropriate eye contact. No psychomotor abnormalities or behavioral peculiarities noted. Her mood was {gbmood:21757} with congruent affect. Her thought processes were logical, linear, and goal-directed. No hallucinations, delusions, bizarre thinking or behavior reported or observed. Judgment, insight, and impulse control appeared to be grossly intact. There was no evidence of paraphasias (i.e., errors in speech, gross mispronunciations, and word substitutions), repetition deficits, or disturbances in volume or prosody (i.e., rhythm and intonation). There was no evidence of attention or memory impairments. Rebekah [name] denied current suicidal and homicidal ideation, intent or plan.  Structured Assessment Results: The Patient Health Questionnaire-9 (PHQ-9) is a self-report measure that assesses symptoms and severity of depression over the course of the last two weeks. Rebekah [name] obtained a score of *** suggesting {GBPHQ9SEVERITY:21752}. Rebekah [name] finds the endorsed symptoms to be {gbphq9difficulty:21754}.  The Generalized Anxiety Disorder-7 (GAD-7) is a brief self-report measure that assesses symptoms of anxiety over the course of the last two weeks. Rebekah [name] obtained a score of *** suggesting {gbgad7severity:21753}.  Interventions: Rebekah [name] was administered the PHQ-9 and GAD-7 for symptom monitoring. Content from the last session was reviewed, and a risk assessment was completed. Throughout today's session, empathic reflections and validation were provided. Psychoeducation regarding *** was provided and *** [insert other interventions].   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 Jalilah has demonstrated progress in her goal of decreasing emotional eating as evidenced by ***  Plan: Crystol continues to appear able and willing to participate as evidenced by engagement in reciprocal conversation, and asking questions for clarification as appropriate.*** The next appointment will be scheduled in {gbweeks:21758}. The next session will focus on reviewing learned skills, and working towards the established treatment goal.***

## 2018-09-10 ENCOUNTER — Ambulatory Visit (INDEPENDENT_AMBULATORY_CARE_PROVIDER_SITE_OTHER): Payer: 59 | Admitting: Family Medicine

## 2018-09-10 ENCOUNTER — Encounter (INDEPENDENT_AMBULATORY_CARE_PROVIDER_SITE_OTHER): Payer: Self-pay

## 2018-09-10 ENCOUNTER — Ambulatory Visit (INDEPENDENT_AMBULATORY_CARE_PROVIDER_SITE_OTHER): Payer: Self-pay | Admitting: Psychology

## 2018-09-10 VITALS — BP 106/73 | HR 80 | Temp 98.1°F | Ht 64.0 in | Wt 182.0 lb

## 2018-09-10 DIAGNOSIS — E559 Vitamin D deficiency, unspecified: Secondary | ICD-10-CM

## 2018-09-10 DIAGNOSIS — Z9189 Other specified personal risk factors, not elsewhere classified: Secondary | ICD-10-CM | POA: Diagnosis not present

## 2018-09-10 DIAGNOSIS — R7303 Prediabetes: Secondary | ICD-10-CM | POA: Diagnosis not present

## 2018-09-10 DIAGNOSIS — E669 Obesity, unspecified: Secondary | ICD-10-CM | POA: Diagnosis not present

## 2018-09-10 DIAGNOSIS — Z6831 Body mass index (BMI) 31.0-31.9, adult: Secondary | ICD-10-CM | POA: Diagnosis not present

## 2018-09-10 MED ORDER — VITAMIN D (ERGOCALCIFEROL) 1.25 MG (50000 UNIT) PO CAPS: 50000.0000 [IU] | ORAL_CAPSULE | ORAL | 0 refills | Status: DC

## 2018-09-15 NOTE — Progress Notes (Signed)
Office: [phone]  /  Fax: [phone]   HPI:   Chief Complaint: OBESITY Rebekah [name] is here to discuss her progress with her obesity treatment plan. She is on the  keep a food journal with 300-400 calories and 30+g of protein daily and follow the Category 2 plan and is following her eating plan approximately 95 % of the time. She states she is exercising 0 minutes 0 times per week. Autie voices concerns over Thanksgiving indulgences. She plans to get takeout from [employer]. Her husband has started eating some meats. She is hungry around 10/11 am.  Her weight is 182 lb (82.6 kg) today and has had a weight loss of 5 pounds over a period of 3 weeks since her last visit. She has lost 16 lbs since starting treatment with [name].  Vitamin D deficiency Rebekah [name] has a diagnosis of vitamin D deficiency. She is currently taking vit D and denies nausea, vomiting or muscle weakness. She reports fatigue. Recent DEXA scan showed positive for osteopenia.   Ref. Range 07/21/2018 11:43  Vitamin D, 25-Hydroxy Latest Ref Range: 30.0 - 100.0 ng/mL 31.4   Pre-Diabetes Rebekah [name] has a diagnosis of prediabetes based on her elevated HgA1c and was informed this puts her at greater risk of developing diabetes. She is taking metformin currently and continues to work on diet and exercise to decrease risk of diabetes. She denies nausea or hypoglycemia. She reports having carbohydrate cravings.   At risk for osteopenia and osteoporosis Rebekah [name] is at higher risk of osteopenia and osteoporosis due to vitamin D deficiency.   ALLERGIES: Allergies  Allergen Reactions  . Penicillins Hives and Rash    Has patient had a PCN reaction causing immediate rash, facial/tongue/throat swelling, SOB or lightheadedness with hypotension: Yes Has patient had a PCN reaction causing severe rash involving mucus membranes or skin necrosis: Yes Has patient had a PCN reaction that required hospitalization: No Has patient had a PCN reaction occurring within the  last 10 years: No If all of the above answers are "NO", then may proceed with Cephalosporin use.   . Erythromycin Nausea Only    Gastrointestinal issues.    MEDICATIONS: Current Outpatient Medications on File Prior to Visit  Medication Sig Dispense Refill  . aspirin 81 MG tablet Take 1 tablet (81 mg total) by mouth daily. 90 tablet 3  . Azelaic Acid (FINACEA) 15 % FOAM Apply 1 application topically 2 (two) times daily as needed.     Marland Kitchen DIVIGEL 1 MG/GM GEL Take 1 mg by mouth daily.     Marland Kitchen L-Methylfolate-Algae (DEPLIN 7.5) 7.5-90.314 MG CAPS Take 1 capsule by mouth daily.     Marland Kitchen lisinopril (PRINIVIL,ZESTRIL) 10 MG tablet Take 1 tablet (10 mg total) by mouth daily. 90 tablet 1  . metFORMIN (GLUCOPHAGE) 500 MG tablet Take 1 tablet (500 mg total) by mouth daily with breakfast. 30 tablet 0  . Multiple Vitamin (MULTIVITAMIN) tablet Take 1 tablet by mouth daily. 90 tablet 1  . nortriptyline (PAMELOR) 75 MG capsule Take 1 capsule (75 mg total) by mouth daily. 90 capsule 3  . omeprazole (PRILOSEC) 40 MG capsule Take 1 capsule (40 mg total) by mouth daily. 90 capsule 1  . Probiotic Product (PROBIOTIC-10) CHEW Chew 1 tablet by mouth daily.     . progesterone (PROMETRIUM) 100 MG capsule Take 100 mg by mouth daily.    . ranitidine (ZANTAC) 150 MG tablet Take 150 mg by mouth 2 (two) times daily.    . rosuvastatin (CRESTOR) 10 MG  tablet Take 1 tablet (10 mg total) by mouth at bedtime. 90 tablet 1   No current facility-administered medications on file prior to visit.     PAST MEDICAL HISTORY: Past Medical History:  Diagnosis Date  . Allergy   . Back pain   . Bowel obstruction ([place]) 2019  . Depression   . Gall bladder stones   . GERD (gastroesophageal reflux disease)   . Hyperlipidemia   . Hypertension   . IBS (irritable bowel syndrome)   . IBS (irritable bowel syndrome)   . Joint pain   . Obesity    Lap band surgery 12-08 Dr Hassell Done  . OSA (obstructive sleep apnea)    using a CPAP    .  Pre-diabetes     PAST SURGICAL HISTORY: Past Surgical History:  Procedure Laterality Date  . APPENDECTOMY    . BREAST REDUCTION SURGERY    . CHOLECYSTECTOMY    . CLOSED REDUCTION FINGER WITH PERCUTANEOUS PINNING Left 09/14/2014   Procedure: CLOSED REDUCTION PERCUTANEOUS PINNING ;  Surgeon: Leanora Cover, MD;  Location: [facility];  Service: Orthopedics;  Laterality: Left;  . ESOPHAGEAL MANOMETRY N/A 09/23/2017   Procedure: ESOPHAGEAL MANOMETRY (EM);  Surgeon: Mauri Pole, MD;  Location: [facility];  Service: Endoscopy;  Laterality: N/A;  . Lap Band Adjustment  09/01/2015   Dr. Redmond Pulling  . LAPAROSCOPIC GASTRIC BANDING  09/2007   Lap band surgery 12-08 Dr Hassell Done  . NASAL SINUS SURGERY    . TONSILLECTOMY     x2    SOCIAL HISTORY: Social History   Tobacco Use  . Smoking status: Never Smoker  . Smokeless tobacco: Never Used  Substance Use Topics  . Alcohol use: Yes    Comment: socially   . Drug use: No    FAMILY HISTORY: Family History  Problem Relation Age of Onset  . Coronary artery disease Father        CABG, smoker .age onset 52s  . Prostate cancer Father   . Pulmonary fibrosis Father        and others  . High blood pressure Father   . High Cholesterol Father   . Depression Father   . Obesity Father   . Breast cancer Mother        age 53  . Heart disease Mother        CABG, smoker .age onset 11s  . Diverticulitis Mother   . High blood pressure Mother   . High Cholesterol Mother   . Thyroid disease Mother   . Depression Mother   . Obesity Mother   . Heart attack Brother        age 11  . Diabetes Neg Hx   . Colon cancer Neg Hx   . Stomach cancer Neg Hx   . Esophageal cancer Neg Hx   . Rectal cancer Neg Hx     ROS: Review of Systems  Constitutional: Positive for malaise/fatigue and weight loss.  Gastrointestinal: Negative for nausea and vomiting.  Musculoskeletal:       Negative for muscle weakness  Endo/Heme/Allergies:        Negative for hypoglycemia    PHYSICAL EXAM: Blood pressure 106/73, pulse 80, temperature 98.1 F (36.7 C), temperature source Oral, height 5\' 4"  (1.626 m), weight 182 lb (82.6 kg), SpO2 98 %. Body mass index is 31.24 kg/m. Physical Exam  Constitutional: She is oriented to person, place, and time. She appears well-developed and well-nourished.  HENT:  Head: Normocephalic.  Neck: Normal  range of motion.  Cardiovascular: Normal rate.  Pulmonary/Chest: Effort normal.  Musculoskeletal: Normal range of motion.  Neurological: She is alert and oriented to person, place, and time.  Skin: Skin is warm and dry.  Psychiatric: She has a normal mood and affect. Her behavior is normal.  Vitals reviewed.   RECENT LABS AND TESTS: BMET    Component Value Date/Time   NA 141 07/21/2018 1143   K 4.6 07/21/2018 1143   CL 102 07/21/2018 1143   CO2 23 07/21/2018 1143   GLUCOSE 88 07/21/2018 1143   GLUCOSE 74 03/04/2018 0852   GLUCOSE 95 08/24/2010 1048   BUN 10 07/21/2018 1143   CREATININE 0.83 07/21/2018 1143   CALCIUM 9.7 07/21/2018 1143   GFRNONAA 80 07/21/2018 1143   GFRAA 92 07/21/2018 1143   Lab Results  Component Value Date   HGBA1C 6.3 07/10/2018   HGBA1C 6.2 03/04/2018   HGBA1C 6.1 06/05/2017   HGBA1C 6.0 06/14/2016   HGBA1C 6.0 09/23/2015   Lab Results  Component Value Date   INSULIN 38.3 (H) 07/21/2018   CBC    Component Value Date/Time   WBC 9.0 07/21/2018 1143   WBC 8.7 10/16/2017 1140   RBC 4.60 07/21/2018 1143   RBC 4.58 10/16/2017 1140   HGB 12.8 07/21/2018 1143   HCT 39.1 07/21/2018 1143   PLT 325.0 10/16/2017 1140   MCV 85 07/21/2018 1143   MCH 27.8 07/21/2018 1143   MCH 28.0 09/30/2017 0453   MCHC 32.7 07/21/2018 1143   MCHC 32.7 10/16/2017 1140   RDW 14.7 07/21/2018 1143   LYMPHSABS 3.3 (H) 07/21/2018 1143   MONOABS 0.5 10/16/2017 1140   EOSABS 0.2 07/21/2018 1143   BASOSABS 0.1 07/21/2018 1143   Iron/TIBC/Ferritin/ %Sat    Component Value  Date/Time   IRON 86 01/07/2015 0902   FERRITIN 19.1 01/07/2015 0902   IRONPCTSAT 23.2 07/30/2008 0856   Lipid Panel     Component Value Date/Time   CHOL 117 07/10/2018 0819   TRIG 77.0 07/10/2018 0819   TRIG 99 08/30/2006 0904   HDL 48.00 07/10/2018 0819   CHOLHDL 2 07/10/2018 0819   VLDL 15.4 07/10/2018 0819   LDLCALC 54 07/10/2018 0819   Hepatic Function Panel     Component Value Date/Time   PROT 7.5 07/21/2018 1143   ALBUMIN 4.5 07/21/2018 1143   AST 24 07/21/2018 1143   ALT 24 07/21/2018 1143   ALKPHOS 87 07/21/2018 1143   BILITOT 0.3 07/21/2018 1143   BILIDIR 0.0 05/15/2012 1453      Component Value Date/Time   TSH 3.130 07/21/2018 1143   TSH 4.65 (H) 07/10/2018 0819   TSH 3.02 06/05/2017 0840    Ref. Range 07/21/2018 11:43  Vitamin D, 25-Hydroxy Latest Ref Range: 30.0 - 100.0 ng/mL 31.4    ASSESSMENT AND PLAN: Vitamin D deficiency - Plan: Vitamin D, Ergocalciferol, (DRISDOL) 1.25 MG (50000 UT) CAPS capsule  Prediabetes  At risk for osteoporosis  Class 1 obesity with serious comorbidity and body mass index (BMI) of 31.0 to 31.9 in adult, unspecified obesity type  PLAN: Vitamin D Deficiency Rebekah [name] was informed that low vitamin D levels contributes to fatigue and are associated with obesity, breast, and colon cancer. She agrees to continue to take prescription Vit D @50 ,000 IU every week #4 with no refills and will follow up for routine testing of vitamin D, at least 2-3 times per year. She was informed of the risk of over-replacement of vitamin D and agrees to not  increase her dose unless she discusses this with [name] first. Agrees to follow up with our clinic as directed.   Pre-Diabetes Rebekah [name] will continue to work on weight loss, exercise, and decreasing simple carbohydrates in her diet to help decrease the risk of diabetes. We dicussed metformin including benefits and risks. She was informed that eating too many simple carbohydrates or too many calories at one  sitting increases the likelihood of GI side effects. Rebekah [name] agrees to continue Metformin as prescribed.  Rebekah [name] agreed to follow up with [name] as directed to monitor her progress. We will repeat labs in early January.   At risk for osteopenia and osteoporosis Rebekah [name] was given extended  (15 minutes) osteoporosis prevention counseling today. Rebekah [name] is at risk for osteopenia and osteoporosis due to her vitamin D deficiency. She was encouraged to take her vitamin D and follow her higher calcium diet and increase strengthening exercise to help strengthen her bones and decrease her risk of osteopenia and osteoporosis.  Obesity Rebekah [name] is currently in the action stage of change. As such, her goal is to continue with weight loss efforts She has agreed to follow the Category 2 plan or pescatarian plan.  Rebekah [name] has been instructed to work up to a goal of 150 minutes of combined cardio and strengthening exercise per week for weight loss and overall health benefits. We discussed the following Behavioral Modification Strategies today: increasing lean protein intake, increasing vegetables, planning for success, and work on meal planning and easy cooking plans   Rebekah [name] has agreed to follow up with our clinic in 2 weeks. She was informed of the importance of frequent follow up visits to maximize her success with intensive lifestyle modifications for her multiple health conditions.   OBESITY BEHAVIORAL INTERVENTION VISIT  Today's visit was # 4   Starting weight: 198 lb Starting date: 07/21/18 Today's weight : Weight: 182 lb (82.6 kg)  Today's date:09/10/18 Total lbs lost to date: 16 lb    ASK: We discussed the diagnosis of obesity with Rebekah [name] today and Rebekah [name] agreed to give [name] permission to discuss obesity behavioral modification therapy today.  ASSESS: Rebekah [name] has the diagnosis of obesity and her BMI today is 31.22 Rebekah [name] is in the action stage of change   ADVISE: Rebekah [name] was educated on the multiple  health risks of obesity as well as the benefit of weight loss to improve her health. She was advised of the need for long term treatment and the importance of lifestyle modifications to improve her current health and to decrease her risk of future health problems.  AGREE: Multiple dietary modification options and treatment options were discussed and  Rebekah [name] agreed to follow the recommendations documented in the above note.  ARRANGE: Rebekah [name] was educated on the importance of frequent visits to treat obesity as outlined per CMS and USPSTF guidelines and agreed to schedule her next follow up appointment today.  I, Renee Ramus, am acting as transcriptionist for Ilene Qua, MD   I have reviewed the above documentation for accuracy and completeness, and I agree with the above. - Ilene Qua, MD

## 2018-09-17 ENCOUNTER — Other Ambulatory Visit: Payer: Self-pay | Admitting: Internal Medicine

## 2018-09-17 ENCOUNTER — Ambulatory Visit: Payer: 59

## 2018-09-23 ENCOUNTER — Other Ambulatory Visit (INDEPENDENT_AMBULATORY_CARE_PROVIDER_SITE_OTHER): Payer: Self-pay | Admitting: Family Medicine

## 2018-09-23 ENCOUNTER — Ambulatory Visit (INDEPENDENT_AMBULATORY_CARE_PROVIDER_SITE_OTHER): Payer: 59

## 2018-09-23 DIAGNOSIS — R7303 Prediabetes: Secondary | ICD-10-CM

## 2018-09-23 DIAGNOSIS — Z23 Encounter for immunization: Secondary | ICD-10-CM | POA: Diagnosis not present

## 2018-09-25 MED ORDER — METFORMIN HCL 500 MG PO TABS: 500.0000 mg | ORAL_TABLET | Freq: Every day | ORAL | 0 refills | Status: DC

## 2018-10-02 ENCOUNTER — Ambulatory Visit (INDEPENDENT_AMBULATORY_CARE_PROVIDER_SITE_OTHER): Payer: 59 | Admitting: Family Medicine

## 2018-10-02 ENCOUNTER — Ambulatory Visit (INDEPENDENT_AMBULATORY_CARE_PROVIDER_SITE_OTHER): Payer: 59 | Admitting: Psychology

## 2018-10-02 ENCOUNTER — Encounter (INDEPENDENT_AMBULATORY_CARE_PROVIDER_SITE_OTHER): Payer: Self-pay | Admitting: Family Medicine

## 2018-10-02 VITALS — BP 100/67 | HR 76 | Temp 97.8°F | Ht 64.0 in | Wt 179.0 lb

## 2018-10-02 DIAGNOSIS — Z9189 Other specified personal risk factors, not elsewhere classified: Secondary | ICD-10-CM

## 2018-10-02 DIAGNOSIS — E66811 Obesity, class 1: Secondary | ICD-10-CM

## 2018-10-02 DIAGNOSIS — F332 Major depressive disorder, recurrent severe without psychotic features: Secondary | ICD-10-CM

## 2018-10-02 DIAGNOSIS — E559 Vitamin D deficiency, unspecified: Secondary | ICD-10-CM | POA: Diagnosis not present

## 2018-10-02 DIAGNOSIS — E669 Obesity, unspecified: Secondary | ICD-10-CM

## 2018-10-02 DIAGNOSIS — Z6830 Body mass index (BMI) 30.0-30.9, adult: Secondary | ICD-10-CM | POA: Diagnosis not present

## 2018-10-02 DIAGNOSIS — R7303 Prediabetes: Secondary | ICD-10-CM | POA: Diagnosis not present

## 2018-10-02 MED ORDER — METFORMIN HCL 500 MG PO TABS: 500.0000 mg | ORAL_TABLET | Freq: Every day | ORAL | 0 refills | Status: DC

## 2018-10-02 MED ORDER — VITAMIN D (ERGOCALCIFEROL) 1.25 MG (50000 UNIT) PO CAPS: 50000.0000 [IU] | ORAL_CAPSULE | ORAL | 0 refills | Status: DC

## 2018-10-02 NOTE — Progress Notes (Addendum)
Office: [phone]  /  Fax: [phone]   Date: October 02, 2018  Time Seen: 8:00am Duration: 30 minutes Provider: Glennie Isle, Psy.D. Type of Session: Individual Therapy   HPI: Tammywas referred by Dr. Brent Bulla to depression with emotional eating behaviors. She was seen for an initial appointment by this provider on August 05, 2018. Per the note for the initial visit withDr. Conrad [place] July 21, 2018,"[name] a Psychiatrist, and she notes a lot of family triggers. Tammystruggleswith emotional eating and using food for comfort to the extent that it is negatively impacting herhealth. Sheoften snacks when sheis not hungry. Tammysometimes feels sheis out of control and then feels guilty that shemade poor food choices. Nunzio Cory been working on behavior modification techniques to help reduce heremotional eating and has been somewhat successful.Sheshows no sign of suicidal or homicidal ideations."Beaulah's Food and Mood (modified PHQ-9) score was22. In addition, per the note for the initial visit withDr. Conrad [place] July 21, 2018,Rebekah [name] has been heavy most of her life and she started gaining weight at the age 27. Her heaviest weight ever was 232 pounds. During her initial appointment with Dr. Adair Patter, Mela reported experiencing the following:significant food craving issues; snacking frequently in the evenings; trying to eat vegetarian; trying to eat vegan; frequently drinking liquids with calories; frequently making poor food choices; frequently eating larger portions than normal; and struggling with emotional eating.During the initial appointment with this provider, Rebekah [name] explained that during her first appointment with Dr. Denice Paradise described not having much support. She stated, "My father says if you are fat, you are stupid." In addition, Rebekah [name] indicated her father has approximately two months to live. Rebekah [name] stated her last  episode of emotional eating was last Thursday. She added, "Night is my weak point." Rebekah [name] stated she ended up consuming two diet popsicles and a "big handful of nuts."Furthermore, she shared, "I love to cook." She added that she brought a recipe book for soups to share with the dietician today during her appointment after meeting with this provider. Rebekah [name] reported a history of binge eating. She noted the last time was approximately onemonthago. She explained her husband, a Agricultural consultant, was gone and she ate something "every 10 minutes." In addition, Rebekah [name] stated binge and emotional eating started at the age 44. She could not identify any significant eventsat that age. However, she noted she could have "anything" she wanted when she saw her grandparents. Regarding purging, Rebekah [name] stated it started in her 41s and the last time was approximately 10 years ago. She denied engagement in any other compensatory strategies. 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 Tammyendorsed the following: overeat when you are celebrating, experience food cravings on a regular basis, eat certain foods when you are anxious, stressed, depressed, or your feelings are hurt, use food to help you cope with emotional situations, find food is comforting to you, overeat when you are angry or upset, overeat when you are worried about something, overeat frequently when you are bored or lonely, overeat when you are alone, but eat much less when you are with other people and eat as a reward.  Session Content: Session focused on the following treatment goal: decrease emotional eating. The session was initiated with the administration of the PHQ-9 and GAD-7, as well as a brief check-in. A brief risk assessment was completed and Rebekah [name] denied experiencing suicidal or homicidal ideation, plan and intent since the last  appointment  with this provider. Rebekah [name] reported she recently went on vacation and described engaging in various pleasurable activities. As such, psychoeducation regarding the connection between thoughts, feelings, and behaviors was provided. Rebekah [name] was provided a handout explaining the aforementioned. In addition, psychoeducation regarding pleasurable activities, including its impact on emotional eating was provided. Rebekah [name] was provided with a handout with various options of pleasurable activities, and was encouraged to engage in different activities between now and the next appointment with this provider. Rebekah [name] agreed. Regarding emotional eating, [name] denied experiencing episodes aside from experiencing cravings for sweets at night. Thus, this provider and Brandilyn discussed using snack calories for sweets in the evenings. Regarding triggers for emotional eating, Rebekah [name] observed stress tends to be the prominent trigger; therefore, this provider discussed how pleasurable activities can be utilized for the aforementioned. For the upcoming holidays, this provider and Rebekah [name] discussed some behavioral strategies to engage in during holiday meals. Rebekah [name] was provided with a handout with the hunger and satisfaction scale to use in conjunction with the behavioral strategies. Rebekah [name] was receptive to today's session as evidenced by openness to sharing, responsiveness to feedback, and willingness to engage in pleasurable activities and implement discussed behavioral strategies..  Mental Status Examination: Rebekah [name] arrived on time for the appointment. She presented as appropriately dressed and groomed. Rebekah [name] appeared her stated age and demonstrated adequate orientation to time, place, person, and purpose of the appointment. She also demonstrated appropriate eye contact. No psychomotor abnormalities or behavioral peculiarities noted. Her mood was euthymic with congruent affect. Her thought processes were logical, linear, and  goal-directed. No hallucinations, delusions, bizarre thinking or behavior reported or observed. Judgment, insight, and impulse control appeared to be grossly intact. There was no evidence of paraphasias (i.e., errors in speech, gross mispronunciations, and word substitutions), repetition deficits, or disturbances in volume or prosody (i.e., rhythm and intonation). There was no evidence of attention or memory impairments. Latrease denied current suicidal and homicidal ideation, intent or plan.  Structured Assessment Results: The Patient Health Questionnaire-9 (PHQ-9) is a self-report measure that assesses symptoms and severity of depression over the course of the last two weeks. Shallen obtained a score of 5 suggesting mild depression. Navi finds the endorsed symptoms to be somewhat difficult. Depression screen PHQ 2/9 10/02/2018  Decreased Interest 1  Down, Depressed, Hopeless 0  PHQ - 2 Score 1  Altered sleeping 1  Tired, decreased energy 1  Change in appetite 0  Feeling bad or failure about yourself  1  Trouble concentrating 1  Moving slowly or fidgety/restless 0  Suicidal thoughts 0  PHQ-9 Score 5  Difficult doing work/chores -   The Generalized Anxiety Disorder-7 (GAD-7) is a brief self-report measure that assesses symptoms of anxiety over the course of the last two weeks. Vanshika obtained a score of 6 suggesting mild anxiety. GAD 7 : Generalized Anxiety Score 10/02/2018  Nervous, Anxious, on Edge 1  Control/stop worrying 1  Worry too much - different things 1  Trouble relaxing 1  Restless 0  Easily annoyed or irritable 1  Afraid - awful might happen 1  Total GAD 7 Score 6  Anxiety Difficulty Somewhat difficult   Interventions: Jimi was administered the PHQ-9 and GAD-7 for symptom monitoring. Content from the last session was reviewed. Throughout today's session, empathic reflections and validation were provided. A brief risk assessment was completed. Psychoeducation regarding the  connection between thoughts, feelings, and behaviors as well as pleasurable activities was provided. This provider also discussed behavioral strategies for the upcoming holidays as  it relates to food.  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 Jimmy has demonstrated progress in her goal as evidenced by increased awareness of hunger patterns and triggers for emotional eating. Quintasha also demonstrates willingness to engage in learned skills. Additionally, Sherronda described a reduction in emotional eating, as she only experiences cravings at night.  Plan: Josclyn continues to appear able and willing to participate as evidenced by engagement in reciprocal conversation, and asking questions for clarification as appropriate. Due to the upcoming holidays and this provider being out of the office, the next appointment will be scheduled in 3-4 weeks. Jordayn verbally acknowledged understanding of the aforementioned, and noted, "That will be fine. Things have been good." The next session will focus on reviewing learned skills, and working towards the established treatment goal.

## 2018-10-02 NOTE — Progress Notes (Signed)
Office: [phone]  /  Fax: [phone]   HPI:   Chief Complaint: OBESITY Rebekah [name] is here to discuss her progress with her obesity treatment plan. She is on the Category 2 plan or follow the Pescatarian eating plan and is following her eating plan approximately 80 % of the time. She states she is bike riding 3 miles 4 days per week. Rebekah [name] had an enjoyable, slightly indulgent Thanksgiving, but hasn't had trouble getting back on track after that. She had a 5 day vacation after that. She notes increase in cravings at night.  Her weight is 179 lb (81.2 kg) today and has had a weight loss of 3 pounds over a period of 3 weeks since her last visit. She has lost 19 lbs since starting treatment with [name].  Vitamin D Deficiency Rebekah [name] has a diagnosis of vitamin D deficiency. She is currently taking prescription Vit D. She notes fatigue and denies nausea, vomiting or muscle weakness.  At risk for osteopenia and osteoporosis Rebekah [name] is at higher risk of osteopenia and osteoporosis due to vitamin D deficiency.   Pre-Diabetes Rebekah [name] has a diagnosis of pre-diabetes based on her elevated Hgb A1c and was informed this puts her at greater risk of developing diabetes. She notes carbohydrate cravings and denies side effects of metformin. She continues to work on diet and exercise to decrease risk of diabetes. She denies hypoglycemia.  ALLERGIES: Allergies  Allergen Reactions  . Penicillins Hives and Rash    Has patient had a PCN reaction causing immediate rash, facial/tongue/throat swelling, SOB or lightheadedness with hypotension: Yes Has patient had a PCN reaction causing severe rash involving mucus membranes or skin necrosis: Yes Has patient had a PCN reaction that required hospitalization: No Has patient had a PCN reaction occurring within the last 10 years: No If all of the above answers are "NO", then may proceed with Cephalosporin use.   . Erythromycin Nausea Only    Gastrointestinal issues.     MEDICATIONS: Current Outpatient Medications on File Prior to Visit  Medication Sig Dispense Refill  . aspirin 81 MG tablet Take 1 tablet (81 mg total) by mouth daily. 90 tablet 3  . Azelaic Acid (FINACEA) 15 % FOAM Apply 1 application topically 2 (two) times daily as needed.     Marland Kitchen DIVIGEL 1 MG/GM GEL Take 1 mg by mouth daily.     Marland Kitchen L-Methylfolate-Algae (DEPLIN 7.5) 7.5-90.314 MG CAPS Take 1 capsule by mouth daily.     Marland Kitchen lisinopril (PRINIVIL,ZESTRIL) 10 MG tablet Take 1 tablet (10 mg total) by mouth daily. 90 tablet 1  . Multiple Vitamin (MULTIVITAMIN) tablet Take 1 tablet by mouth daily. 90 tablet 1  . nortriptyline (PAMELOR) 75 MG capsule Take 1 capsule (75 mg total) by mouth daily. 90 capsule 3  . omeprazole (PRILOSEC) 40 MG capsule Take 1 capsule (40 mg total) by mouth daily. 90 capsule 1  . Probiotic Product (PROBIOTIC-10) CHEW Chew 1 tablet by mouth daily.     . progesterone (PROMETRIUM) 100 MG capsule Take 100 mg by mouth daily.    . ranitidine (ZANTAC) 150 MG tablet Take 150 mg by mouth 2 (two) times daily.    . rosuvastatin (CRESTOR) 10 MG tablet Take 1 tablet (10 mg total) by mouth at bedtime. 90 tablet 1   No current facility-administered medications on file prior to visit.     PAST MEDICAL HISTORY: Past Medical History:  Diagnosis Date  . Allergy   . Back pain   . Bowel obstruction (  [place]) 2019  . Depression   . Gall bladder stones   . GERD (gastroesophageal reflux disease)   . Hyperlipidemia   . Hypertension   . IBS (irritable bowel syndrome)   . IBS (irritable bowel syndrome)   . Joint pain   . Obesity    Lap band surgery 12-08 Dr Hassell Done  . OSA (obstructive sleep apnea)    using a CPAP    . Pre-diabetes     PAST SURGICAL HISTORY: Past Surgical History:  Procedure Laterality Date  . APPENDECTOMY    . BREAST REDUCTION SURGERY    . CHOLECYSTECTOMY    . CLOSED REDUCTION FINGER WITH PERCUTANEOUS PINNING Left 09/14/2014   Procedure: CLOSED REDUCTION  PERCUTANEOUS PINNING ;  Surgeon: Leanora Cover, MD;  Location: [facility];  Service: Orthopedics;  Laterality: Left;  . ESOPHAGEAL MANOMETRY N/A 09/23/2017   Procedure: ESOPHAGEAL MANOMETRY (EM);  Surgeon: Mauri Pole, MD;  Location: [facility];  Service: Endoscopy;  Laterality: N/A;  . Lap Band Adjustment  09/01/2015   Dr. Redmond Pulling  . LAPAROSCOPIC GASTRIC BANDING  09/2007   Lap band surgery 12-08 Dr Hassell Done  . NASAL SINUS SURGERY    . TONSILLECTOMY     x2    SOCIAL HISTORY: Social History   Tobacco Use  . Smoking status: Never Smoker  . Smokeless tobacco: Never Used  Substance Use Topics  . Alcohol use: Yes    Comment: socially   . Drug use: No    FAMILY HISTORY: Family History  Problem Relation Age of Onset  . Coronary artery disease Father        CABG, smoker .age onset 59s  . Prostate cancer Father   . Pulmonary fibrosis Father        and others  . High blood pressure Father   . High Cholesterol Father   . Depression Father   . Obesity Father   . Breast cancer Mother        age 67  . Heart disease Mother        CABG, smoker .age onset 37s  . Diverticulitis Mother   . High blood pressure Mother   . High Cholesterol Mother   . Thyroid disease Mother   . Depression Mother   . Obesity Mother   . Heart attack Brother        age 56  . Diabetes Neg Hx   . Colon cancer Neg Hx   . Stomach cancer Neg Hx   . Esophageal cancer Neg Hx   . Rectal cancer Neg Hx     ROS: Review of Systems  Constitutional: Positive for malaise/fatigue and weight loss.  Gastrointestinal: Negative for nausea and vomiting.  Musculoskeletal:       Negative muscle weakness  Endo/Heme/Allergies:       Negative hypoglycemia    PHYSICAL EXAM: Blood pressure 100/67, pulse 76, temperature 97.8 F (36.6 C), temperature source Oral, height 5\' 4"  (1.626 m), weight 179 lb (81.2 kg), SpO2 98 %. Body mass index is 30.73 kg/m. Physical Exam Vitals signs reviewed.   Constitutional:      Appearance: Normal appearance. She is obese.  Cardiovascular:     Rate and Rhythm: Normal rate.  Pulmonary:     Effort: Pulmonary effort is normal.  Musculoskeletal: Normal range of motion.  Skin:    General: Skin is warm and dry.  Neurological:     Mental Status: She is alert and oriented to person, place, and time.  Psychiatric:  Mood and Affect: Mood normal.        Behavior: Behavior normal.     RECENT LABS AND TESTS: BMET    Component Value Date/Time   NA 141 07/21/2018 1143   K 4.6 07/21/2018 1143   CL 102 07/21/2018 1143   CO2 23 07/21/2018 1143   GLUCOSE 88 07/21/2018 1143   GLUCOSE 74 03/04/2018 0852   GLUCOSE 95 08/24/2010 1048   BUN 10 07/21/2018 1143   CREATININE 0.83 07/21/2018 1143   CALCIUM 9.7 07/21/2018 1143   GFRNONAA 80 07/21/2018 1143   GFRAA 92 07/21/2018 1143   Lab Results  Component Value Date   HGBA1C 6.3 07/10/2018   HGBA1C 6.2 03/04/2018   HGBA1C 6.1 06/05/2017   HGBA1C 6.0 06/14/2016   HGBA1C 6.0 09/23/2015   Lab Results  Component Value Date   INSULIN 38.3 (H) 07/21/2018   CBC    Component Value Date/Time   WBC 9.0 07/21/2018 1143   WBC 8.7 10/16/2017 1140   RBC 4.60 07/21/2018 1143   RBC 4.58 10/16/2017 1140   HGB 12.8 07/21/2018 1143   HCT 39.1 07/21/2018 1143   PLT 325.0 10/16/2017 1140   MCV 85 07/21/2018 1143   MCH 27.8 07/21/2018 1143   MCH 28.0 09/30/2017 0453   MCHC 32.7 07/21/2018 1143   MCHC 32.7 10/16/2017 1140   RDW 14.7 07/21/2018 1143   LYMPHSABS 3.3 (H) 07/21/2018 1143   MONOABS 0.5 10/16/2017 1140   EOSABS 0.2 07/21/2018 1143   BASOSABS 0.1 07/21/2018 1143   Iron/TIBC/Ferritin/ %Sat    Component Value Date/Time   IRON 86 01/07/2015 0902   FERRITIN 19.1 01/07/2015 0902   IRONPCTSAT 23.2 07/30/2008 0856   Lipid Panel     Component Value Date/Time   CHOL 117 07/10/2018 0819   TRIG 77.0 07/10/2018 0819   TRIG 99 08/30/2006 0904   HDL 48.00 07/10/2018 0819   CHOLHDL 2  07/10/2018 0819   VLDL 15.4 07/10/2018 0819   LDLCALC 54 07/10/2018 0819   Hepatic Function Panel     Component Value Date/Time   PROT 7.5 07/21/2018 1143   ALBUMIN 4.5 07/21/2018 1143   AST 24 07/21/2018 1143   ALT 24 07/21/2018 1143   ALKPHOS 87 07/21/2018 1143   BILITOT 0.3 07/21/2018 1143   BILIDIR 0.0 05/15/2012 1453      Component Value Date/Time   TSH 3.130 07/21/2018 1143   TSH 4.65 (H) 07/10/2018 0819   TSH 3.02 06/05/2017 0840  Results for HAN, LYSNE (MRN [id number]) as of 10/02/2018 14:18  Ref. Range 07/21/2018 11:43  Vitamin D, 25-Hydroxy Latest Ref Range: 30.0 - 100.0 ng/mL 31.4    ASSESSMENT AND PLAN: Vitamin D deficiency - Plan: Vitamin D, Ergocalciferol, (DRISDOL) 1.25 MG (50000 UT) CAPS capsule  Prediabetes - Plan: metFORMIN (GLUCOPHAGE) 500 MG tablet  At risk for osteoporosis  Class 1 obesity with serious comorbidity and body mass index (BMI) of 30.0 to 30.9 in adult, unspecified obesity type  PLAN:  Vitamin D Deficiency Gisele was informed that low vitamin D levels contributes to fatigue and are associated with obesity, breast, and colon cancer. Ima agrees to continue taking prescription Vit D @50 ,000 IU every week #12 and we will refill for 1 month. She will follow up for routine testing of vitamin D, at least 2-3 times per year. She was informed of the risk of over-replacement of vitamin D and agrees to not increase her dose unless she discusses this with [name] first. Kirsty agrees to follow  up with our clinic in 3 weeks.  At risk for osteopenia and osteoporosis Rebekah [name] was given extended (15 minutes) osteoporosis prevention counseling today. Lecretia is at risk for osteopenia and osteoporsis due to her vitamin D deficiency. She was encouraged to take her vitamin D and follow her higher calcium diet and increase strengthening exercise to help strengthen her bones and decrease her risk of osteopenia and osteoporosis.  Pre-Diabetes Rebekah [name] will continue to work  on weight loss, exercise, and decreasing simple carbohydrates in her diet to help decrease the risk of diabetes. We dicussed metformin including benefits and risks. She was informed that eating too many simple carbohydrates or too many calories at one sitting increases the likelihood of GI side effects. Rebekah [name] agrees to continue taking metformin 500 mg PO q AM #90 and we will refill for 1 month. Rebekah [name] agrees to follow up with our clinic in 3 weeks as directed to monitor her progress.  Obesity Rebekah [name] is currently in the action stage of change. As such, her goal is to continue with weight loss efforts She has agreed to follow the Category 2 plan or follow the Pescatarian eating plan Rebekah [name] has been instructed to work up to a goal of 150 minutes of combined cardio and strengthening exercise per week for weight loss and overall health benefits. We discussed the following Behavioral Modification Strategies today: increasing lean protein intake, work on meal planning and easy cooking plans, dealing with family or coworker sabotage, holiday eating strategies, better snacking choices, and planning for success    Rebekah [name] has agreed to follow up with our clinic in 3 weeks. She was informed of the importance of frequent follow up visits to maximize her success with intensive lifestyle modifications for her multiple health conditions.   OBESITY BEHAVIORAL INTERVENTION VISIT  Today's visit was # 5   Starting weight: 198 lbs Starting date: 07/21/18 Today's weight : 179 lbs Today's date: 10/02/2018 Total lbs lost to date: 51    ASK: We discussed the diagnosis of obesity with Rebekah [name] today and Gayathri agreed to give [name] permission to discuss obesity behavioral modification therapy today.  ASSESS: Rebekah [name] has the diagnosis of obesity and her BMI today is 30.71 Rebekah [name] is in the action stage of change   ADVISE: Rebekah [name] was educated on the multiple health risks of obesity as well as the benefit of weight loss  to improve her health. She was advised of the need for long term treatment and the importance of lifestyle modifications to improve her current health and to decrease her risk of future health problems.  AGREE: Multiple dietary modification options and treatment options were discussed and  Rebekah [name] agreed to follow the recommendations documented in the above note.  ARRANGE: Rebekah [name] was educated on the importance of frequent visits to treat obesity as outlined per CMS and USPSTF guidelines and agreed to schedule her next follow up appointment today.  I, Trixie Dredge, am acting as transcriptionist for Ilene Qua, MD  I have reviewed the above documentation for accuracy and completeness, and I agree with the above. - Ilene Qua, MD

## 2018-10-26 IMAGING — CT CT ABD-PELV W/ CM
2 of 5 series · 15 of 46 positions shown, 17 images · IV contrast (Omni 300)
Comparison: CT abdomen and pelvis 06/21/2011.

CLINICAL DATA: Left side abdominal pain with nausea and vomiting
since yesterday.

EXAM:
CT ABDOMEN AND PELVIS WITH CONTRAST
TECHNIQUE: Multidetector CT imaging of the abdomen and pelvis was performed
using the standard protocol following bolus administration of
intravenous contrast.
CONTRAST:  100 ml DITCBJ-7HH IOPAMIDOL (DITCBJ-7HH) INJECTION 61%

[Series 3: a/p w/ 5mm · axial · 0.75mm/px · z∈[+835,+1275]mm · 12 of 100 slices shown, 14 images]
[im 6/100  soft-tissue]
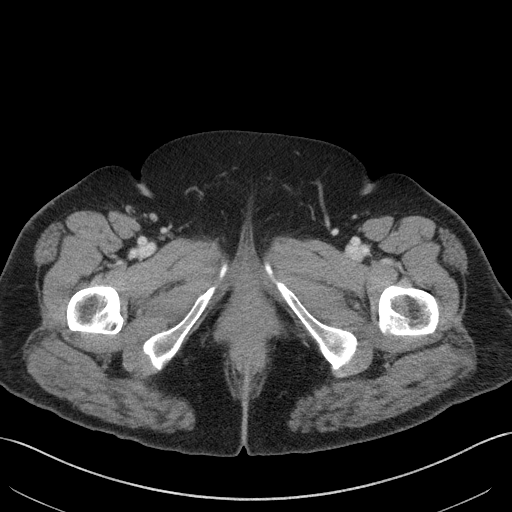
[im 6/100  bone]
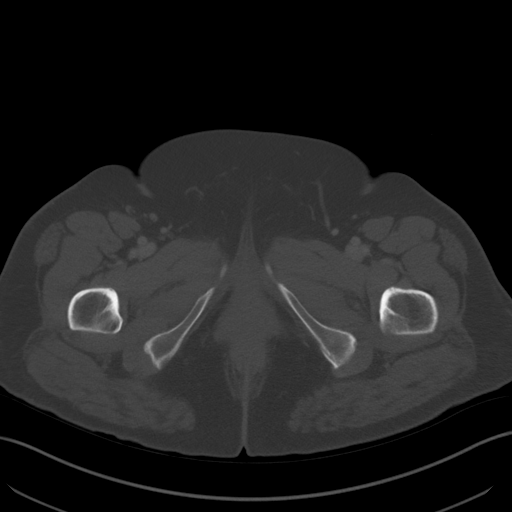
[im 17/100  soft-tissue]
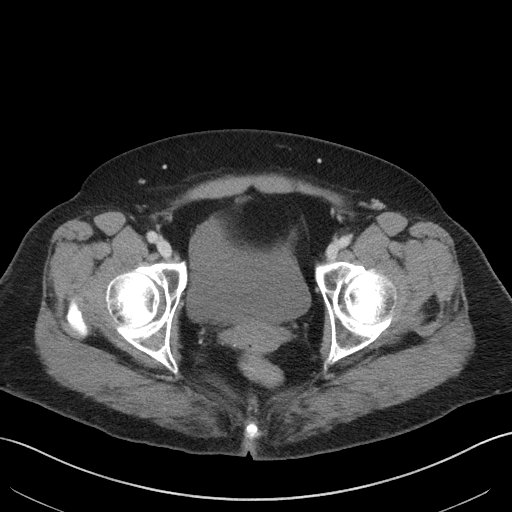
[im 23/100  soft-tissue]
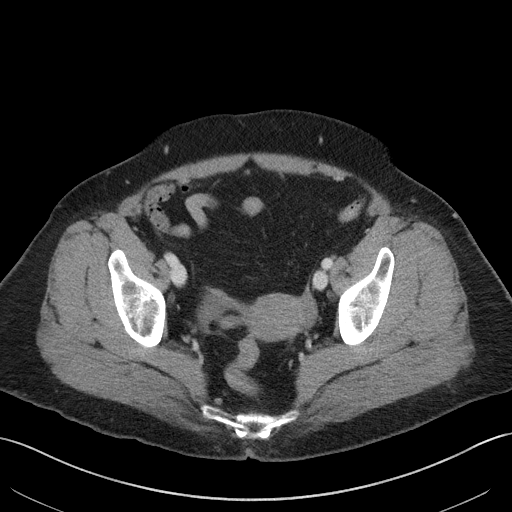
[im 28/100  soft-tissue]
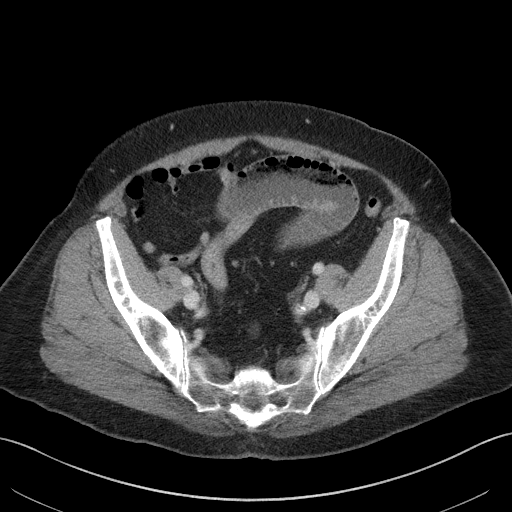
[im 39/100  soft-tissue]
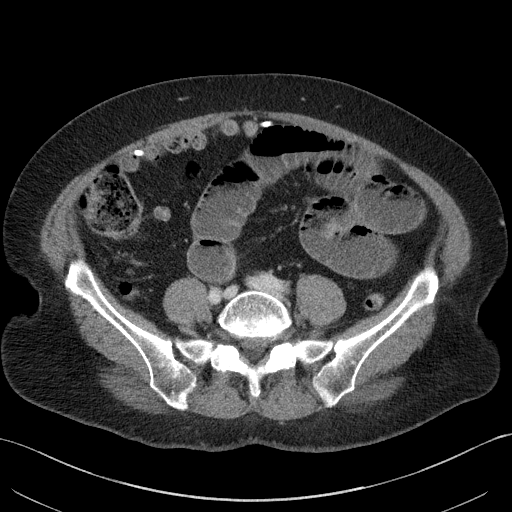
[im 45/100  soft-tissue]
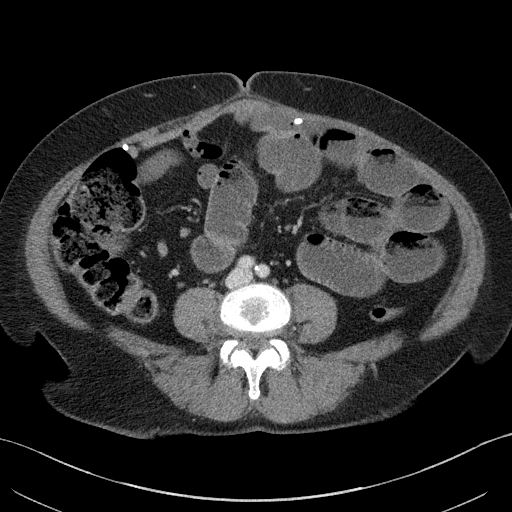
[im 56/100  soft-tissue]
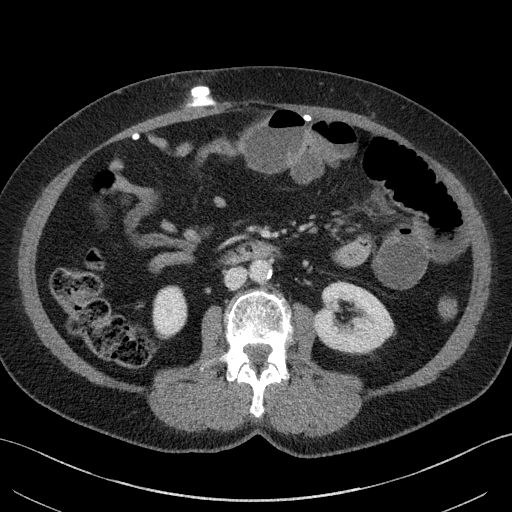
[im 61/100  soft-tissue]
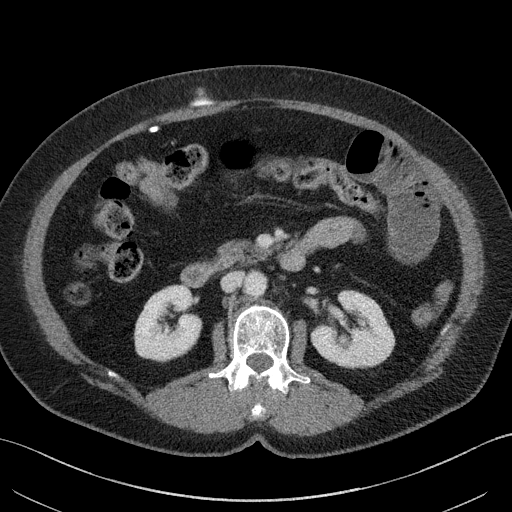
[im 72/100  soft-tissue]
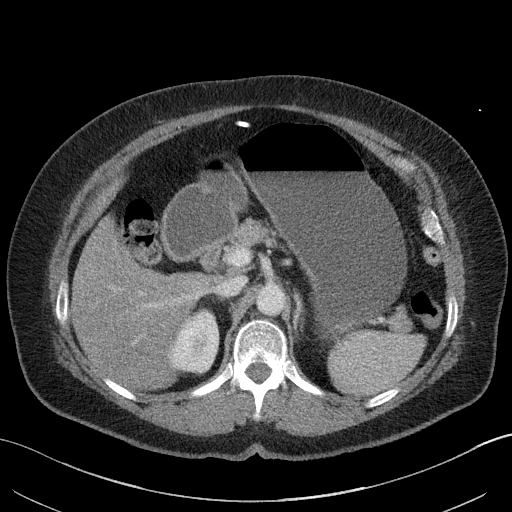
[im 72/100  bone]
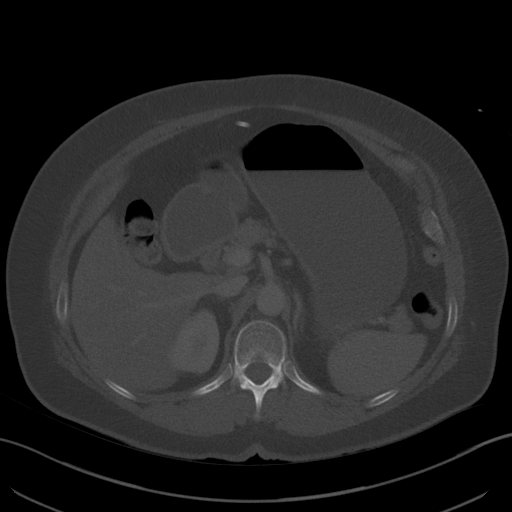
[im 78/100  soft-tissue]
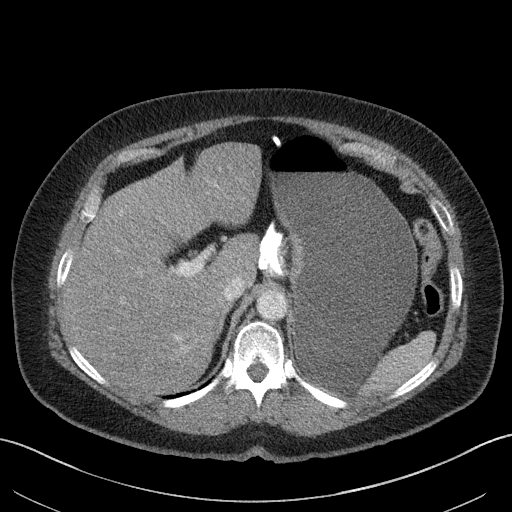
[im 83/100  soft-tissue]
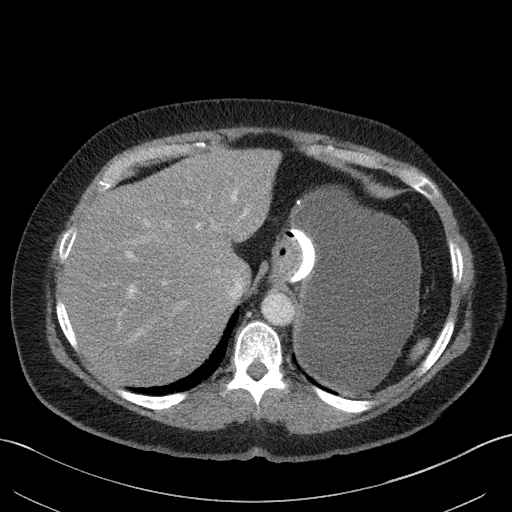
[im 94/100  soft-tissue]
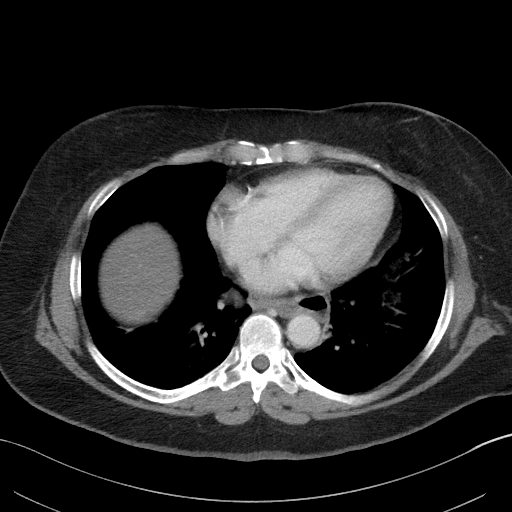

[Series 6: a/p w/ cor · coronal · 0.75mm/px · 3 of 151 slices shown]
[im 51/151  soft-tissue]
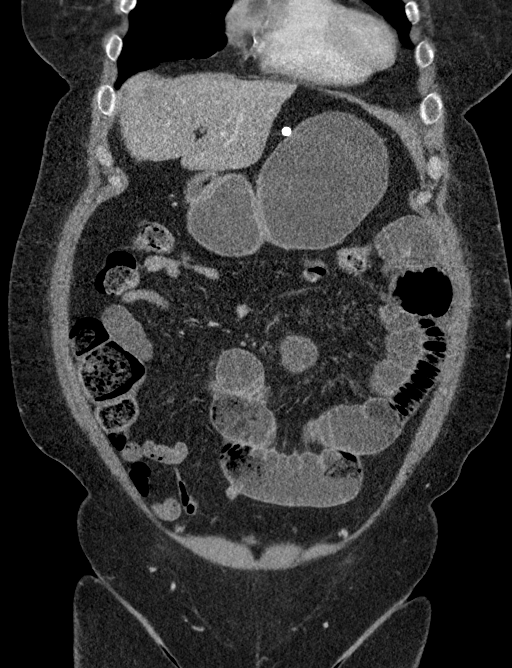
[im 67/151  soft-tissue]
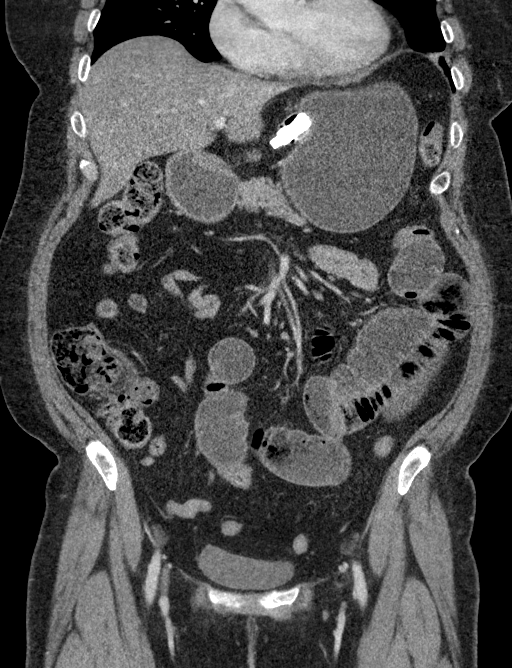
[im 84/151  soft-tissue]
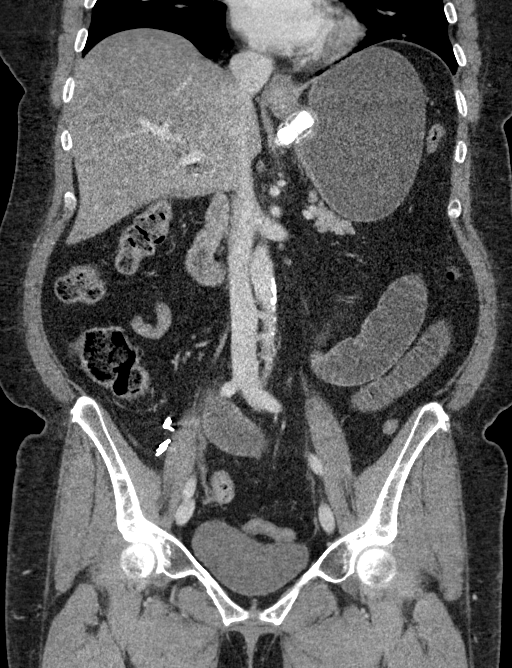

[15 of 46 positions shown; findings below may reference images not displayed]

FINDINGS: Lower chest: Mild dependent atelectasis is seen in the lung bases.
No pleural or pericardial effusion. Heart size is normal.

Hepatobiliary: There is diffuse fatty infiltration of the liver
without focal lesion. The patient is status post cholecystectomy.
Biliary tree is unremarkable.

Pancreas: Unremarkable. No pancreatic ductal dilatation or
surrounding inflammatory changes.

Spleen: Normal in size without focal abnormality.

Adrenals/Urinary Tract: Left adrenal myelolipoma is noted. The right
adrenal gland appears normal. The kidneys, ureters and urinary
bladder appear normal.

Stomach/Bowel: Lap band is in place. The stomach and proximal small
bowel loops are distended. Small bowel loops measure up to 4 cm in
diameter with air-fluid levels present. Transition point is
difficult to localize but appears to be just to the left of midline
adjacent to inflation tube for the patient's lap band where multiple
loops are adhesed together as seen on image 45-55 of series 3. The
colon is largely decompressed but otherwise unremarkable. The
patient is status post appendectomy.

Vascular/Lymphatic: Aortic atherosclerosis. No enlarged abdominal or
pelvic lymph nodes.

Reproductive: Uterus and bilateral adnexa are unremarkable.

Other: No ascites or free air.

Musculoskeletal: Superior endplate compression fracture of L1 is new
since the prior CT scan but appears remote. Lumbar spondylosis
appears worst at L2-3. No lytic or sclerotic lesion.
IMPRESSION: The examination is positive for small bowel obstruction. Transition
point is likely in the mid abdomen where multiple loops appear
adhesed together adjacent to tubing for the patient's lap band. No
evidence of bowel ischemia.

Fatty infiltration of the liver.

Atherosclerosis.

Mild L1 superior endplate compression fracture is new since 8618 but
appears remote.

## 2018-10-26 IMAGING — DX DG ABD PORTABLE 1V
1 series · 1 of 1 positions shown · non-contrast
Comparison: None.

CLINICAL DATA: confirm nasogastric (NG) tube placement advanced

EXAM:
PORTABLE ABDOMEN - 1 VIEW

[abdomen kub]
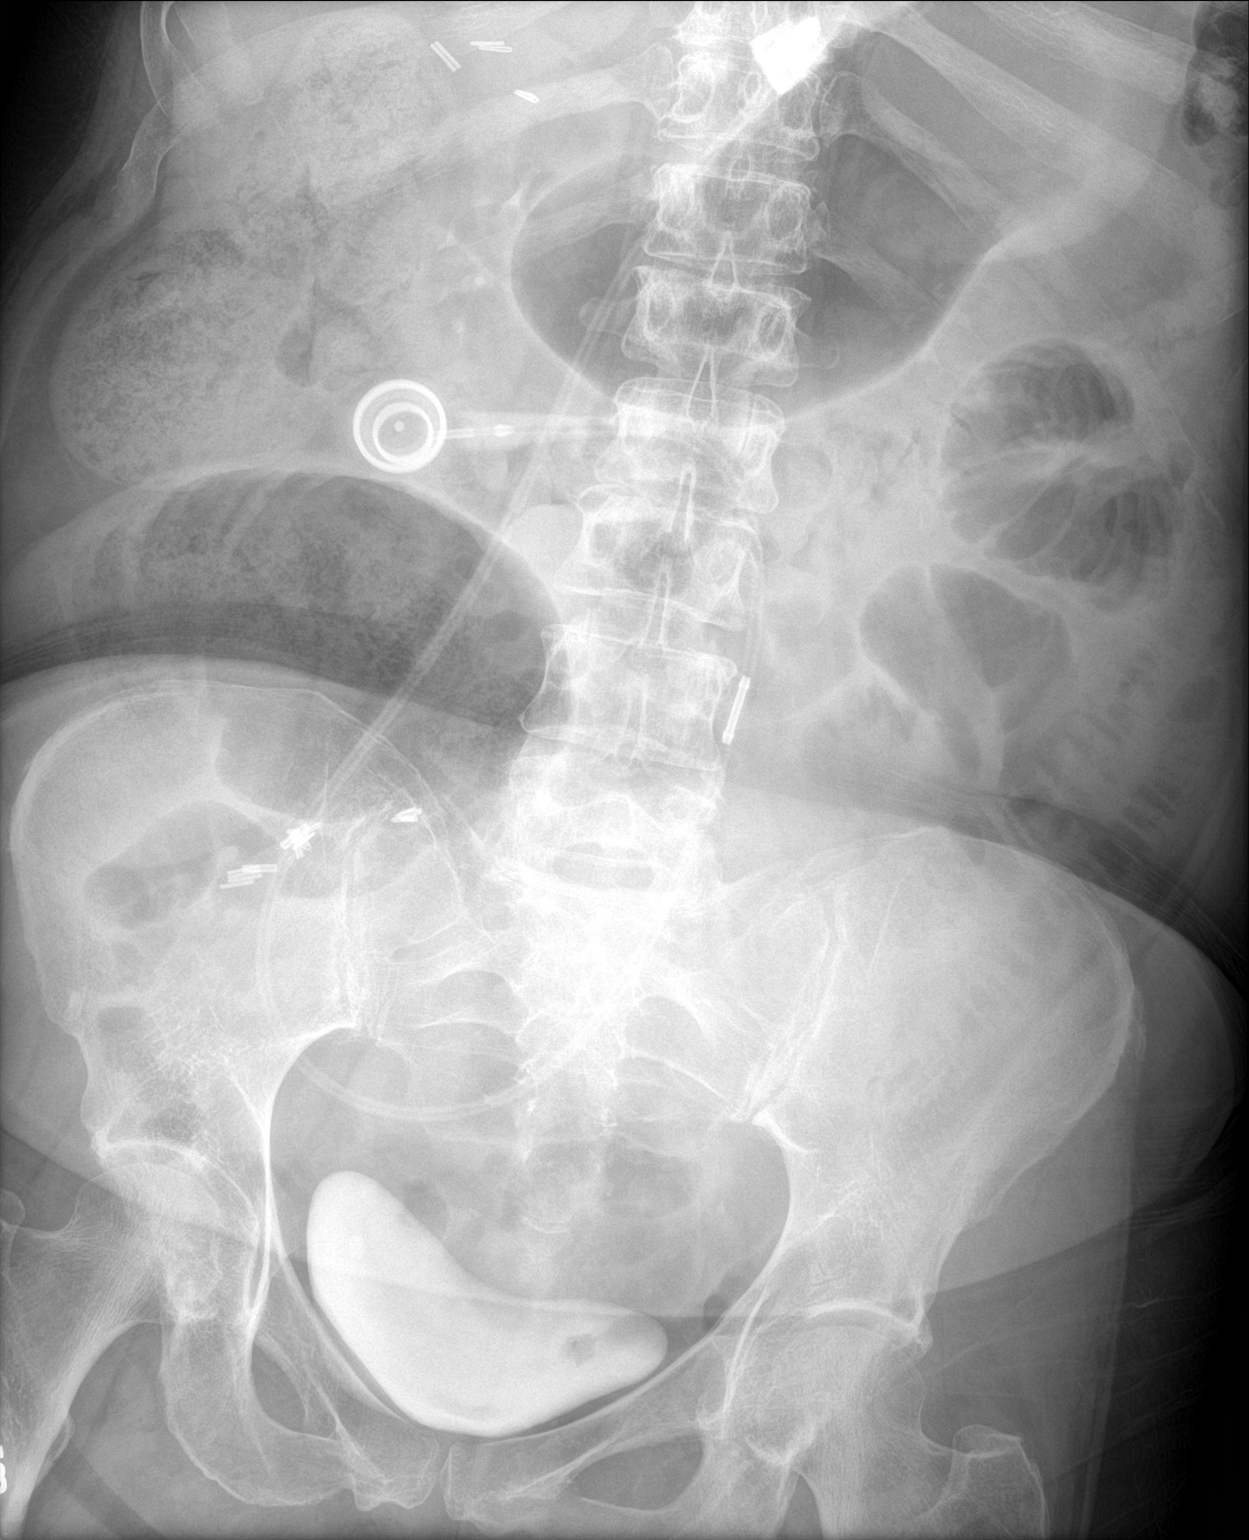

[1 of 1 positions shown; findings below may reference images not displayed]

FINDINGS: Nasogastric tube tip not visualized on the included field of view.
It does not pass into the stomach.
IMPRESSION: Nasogastric tube has not advanced. Tip is above the included field
of view, above the stomach.

## 2018-10-29 NOTE — Progress Notes (Signed)
Office: [phone]  /  Fax: [phone]    Date: October 30, 2018   Time Seen: 8:32am Duration: 30 minutes Provider: Glennie Isle, Psy.D. Type of Session: Individual Therapy  Type of Contact: Face-to-face  HPI: Tammywas referred by Dr. Brent Bulla to depression with emotional eating behaviors.She wasseen for an initial appointment by this provider on August 05, 2018. Per the note for the initial visit withDr. Conrad [place] July 21, 2018,"[name] a Psychiatrist, and she notes a lot of family triggers. Tammystruggleswith emotional eating and using food for comfort to the extent that it is negatively impacting herhealth. Sheoften snacks when sheis not hungry. Tammysometimes feels sheis out of control and then feels guilty that shemade poor food choices. Nunzio Cory been working on behavior modification techniques to help reduce heremotional eating and has been somewhat successful.Sheshows no sign of suicidal or homicidal ideations."Leandra's Food and Mood (modified PHQ-9) score was22.In addition, per the note for the initial visit withDr. Conrad [place] July 21, 2018,Taleigh has been heavy most of her life and she started gaining weight at the age 55. Her heaviest weight ever was 232 pounds. During her initial appointment with Dr. Adair Patter, Crystie reported experiencing the following:significant food craving issues; snacking frequently in the evenings; trying to eat vegetarian; trying to eat vegan; frequently drinking liquids with calories; frequently making poor food choices; frequently eating larger portions than normal; and struggling with emotional eating.Duringthe initial appointment with this provider, Maybree explained that during her first appointment with Dr. Denice Paradise described not having much support. She stated, "My father says if you are fat, you are stupid." In addition, Ralph indicated her father has approximately two months to  live. Tyara stated her last episode of emotional eating was last Thursday. She added, "Night is my weak point." Maresa stated she ended up consuming two diet popsicles and a "big handful of nuts."Furthermore, she shared, "I love to cook." She added that she brought a recipe book for soups to share with the dietician today during her appointment after meeting with this provider.Sherra reported a history of binge eating. She noted the last time was approximately on[place]thago. She explained her husband, a Agricultural consultant, was gone and she ate something "every 10 minutes." In addition, Dorlene stated binge and emotional eating started at the age 19. She could not identify any significant eventsat that age. However, she noted she could have "anything" she wanted when she saw her grandparents. Regarding purging, Euleta stated it started in her 53s and the last time was approximately 10 years ago. She denied engagement in any other compensatory strategies. Shayle stated she was diagnosed with Bulimia in her 68s.Furthermore,Tammywas asked to complete a questionnaire assessing various behaviors related to emotional eating. Tammyendorsed the following: overeat when you are celebrating, experience food cravings on a regular basis, eat certain foods when you are anxious, stressed, depressed, or your feelings are hurt, use food to help you cope with emotional situations, find food is comforting to you, overeat when you are angry or upset, overeat when you are worried about something, overeat frequently when you are bored or lonely, overeat when you are alone, but eat much less when you are with other people and eat as a reward. During today's appointment, Kathlyn reported, "For two weeks there for Christmas, I did whatever I wanted to. I ate whatever I wanted to." She noted she maintained her weight.   Session Content: Session focused on the following treatment goal: decrease emotional eating. The session was initiated with the  administration of the  PHQ-9 and GAD-7, as well as a brief check-in. Aris shared about recent events, including the decline of her father's health. She noted, "It's really hard. He's dying." During the holidays, Sefora discussed deviating from the prescribed meal plan; however, she noted "getting back on track." In addition, she noted making "better choices" during the holidays despite deviations. She also shared about an increase in socialization. Moreover, a risk assessment was completed based on Tamar's history. Brya denied experiencing suicidal ideation, plan, and intent since the last appointment with this provider. She also denied current suicidal ideation, plan, and intent. Furthermore, psychoeducation regarding thought defusion, including its impact on emotional eating was provided. Amahia was led through a thought defusion exercise, and a handout with various exercises was provided. Tayva was encouraged to engage in the thought defusion exercises between now and the next appointment with this provider. Evan agreed. She was also led through an exercise for which she used the thought, "I feel guilty." At the onset of the exercise, she described feeling "negative," but as the exercise progressed, she noted feeling as though "okay, that's just a thought." This provider discussed termination planning, including the option for a referral for longer-term therapeutic services. More specifically, due to ongoing stressors related to her father's health, this provider recommended a referral for longer-term therapeutic services to focus on the aforementioned. Lashana noted she met with her psychiatrist in December and he also reportedly recommended she meet with someone for grief therapy located in his office. Maddisyn noted a plan to think about the options. Ameriah was receptive to today's session as evidenced by openness to sharing, responsiveness to feedback, and engagement in the thought defusion exercise.   Mental  Status Examination: Genice arrived on time for the appointment. She presented as appropriately dressed and groomed. Jevaeh appeared her stated age and demonstrated adequate orientation to time, place, person, and purpose of the appointment. She also demonstrated appropriate eye contact. No psychomotor abnormalities or behavioral peculiarities noted. Her mood was euthymic with congruent affect. Her thought processes were logical, linear, and goal-directed. No hallucinations, delusions, bizarre thinking or behavior reported or observed. Judgment, insight, and impulse control appeared to be grossly intact. There was no evidence of paraphasias (i.e., errors in speech, gross mispronunciations, and word substitutions), repetition deficits, or disturbances in volume or prosody (i.e., rhythm and intonation). There was no evidence of attention or memory impairments. Pernie denied current suicidal and homicidal ideation, intent or plan.  Structured Assessment Results: The Patient Health Questionnaire-9 (PHQ-9) is a self-report measure that assesses symptoms and severity of depression over the course of the last two weeks. Charisma obtained a score of 6 suggesting mild depression. Declyn finds the endorsed symptoms to be somewhat difficult. Depression screen PHQ 2/9 10/30/2018  Decreased Interest 1  Down, Depressed, Hopeless 0  PHQ - 2 Score 1  Altered sleeping 0  Tired, decreased energy 1  Change in appetite 2  Feeling bad or failure about yourself  1  Trouble concentrating 1  Moving slowly or fidgety/restless 0  Suicidal thoughts 0  PHQ-9 Score 6  Difficult doing work/chores -   The Generalized Anxiety Disorder-7 (GAD-7) is a brief self-report measure that assesses symptoms of anxiety over the course of the last two weeks. Verlie obtained a score of 8 suggesting mild anxiety. GAD 7 : Generalized Anxiety Score 10/30/2018  Nervous, Anxious, on Edge 1  Control/stop worrying 1  Worry too much - different things 1    Trouble relaxing 1  Restless 0  Easily annoyed or irritable 3  Afraid - awful might happen 1  Total GAD 7 Score 8  Anxiety Difficulty Somewhat difficult   Interventions:  Administration of PHQ-9 and GAD-7 for symptom monitoring Empathic reflections and validation Risk assessment Processing thoughts and feelings Psychoeducation regarding thought defusion Thought defusion exercise Termination planning Discussed option for a referral for longer-term therapeutic services Brief chart review  DSM-5 Diagnosis: 296.33 (F33.2) Major Depressive Disorder, Recurrent Episode, Severe, With Anxious Distress, Severe  Treatment Goal & Progress: During the initial appointment with this provider, the following treatment goal was established: decrease emotional eating. Synthia has demonstrated progress in her goal as evidenced by increased awareness of hunger patterns and triggers for emotional eating. Lileigh also continues to demonstrate willingness to engage in learned skills. Additionally, Adanna described returning back to her meal plan following the holidays, and described making better choices during the holiday season.   Plan: Turquoise continues to appear able and willing to participate as evidenced by engagement in reciprocal conversation, and asking questions for clarification as appropriate. The next appointment will be scheduled in two weeks. The next session will focus further on thought defusion.

## 2018-10-30 ENCOUNTER — Ambulatory Visit (INDEPENDENT_AMBULATORY_CARE_PROVIDER_SITE_OTHER): Payer: 59 | Admitting: Family Medicine

## 2018-10-30 ENCOUNTER — Ambulatory Visit (INDEPENDENT_AMBULATORY_CARE_PROVIDER_SITE_OTHER): Payer: 59 | Admitting: Psychology

## 2018-10-30 ENCOUNTER — Encounter (INDEPENDENT_AMBULATORY_CARE_PROVIDER_SITE_OTHER): Payer: Self-pay | Admitting: Family Medicine

## 2018-10-30 VITALS — BP 104/71 | HR 79 | Temp 98.5°F | Ht 64.0 in | Wt 179.0 lb

## 2018-10-30 DIAGNOSIS — I10 Essential (primary) hypertension: Secondary | ICD-10-CM

## 2018-10-30 DIAGNOSIS — E669 Obesity, unspecified: Secondary | ICD-10-CM | POA: Diagnosis not present

## 2018-10-30 DIAGNOSIS — E559 Vitamin D deficiency, unspecified: Secondary | ICD-10-CM

## 2018-10-30 DIAGNOSIS — R7303 Prediabetes: Secondary | ICD-10-CM | POA: Diagnosis not present

## 2018-10-30 DIAGNOSIS — Z9189 Other specified personal risk factors, not elsewhere classified: Secondary | ICD-10-CM

## 2018-10-30 DIAGNOSIS — R7989 Other specified abnormal findings of blood chemistry: Secondary | ICD-10-CM

## 2018-10-30 DIAGNOSIS — F332 Major depressive disorder, recurrent severe without psychotic features: Secondary | ICD-10-CM | POA: Diagnosis not present

## 2018-10-30 DIAGNOSIS — Z6830 Body mass index (BMI) 30.0-30.9, adult: Secondary | ICD-10-CM | POA: Diagnosis not present

## 2018-10-31 NOTE — Progress Notes (Signed)
Office: [phone]  /  Fax: [phone]   HPI:   Chief Complaint: OBESITY Rebekah [name] is here to discuss her progress with her obesity treatment plan. She is on the Category 2 plan or follow the Pescatarian eating plan and is following her eating plan approximately 80 % of the time. She states she is exercising 0 minutes 0 times per week. Rebekah [name] spent the holidays with family at home (father is in hospice). She reports she didn't follow the plan for approximately 2 weeks, and she got back on track after Christmas.  Her weight is 179 lb (81.2 kg) today and has not lost weight since her last visit. She has lost 19 lbs since starting treatment with [name].  Vitamin D Deficiency Rebekah [name] has a diagnosis of vitamin D deficiency. She is currently taking prescription Vit D. She notes fatigue and denies nausea, vomiting or muscle weakness.  Hypertension Rebekah [name] is a 58 y.o. female with hypertension. Rebekah [name]'s blood pressure is well controlled and low (numerous readings). She denies chest pain, chest pressure, headaches, lightheadedness, or dizziness. She is working weight loss to help control her blood pressure with the goal of decreasing her risk of heart attack and stroke.   Pre-Diabetes Rebekah [name] has a diagnosis of pre-diabetes based on her elevated Hgb A1c and was informed this puts her at greater risk of developing diabetes. She denies carbohydrate cravings or GI side effects of metformin. She continues to work on diet and exercise to decrease risk of diabetes. She denies hypoglycemia.  At risk for diabetes Rebekah [name] is at higher than average risk for developing diabetes due to her obesity and pre-diabetes. She currently denies polyuria or polydipsia.  Abnormal TSH Rebekah [name]'s TSH was previously elevated. She is not on levothyroxine. She denies hot or cold intolerance or palpitations, but does admit to ongoing fatigue.  ASSESSMENT AND PLAN:  Vitamin D deficiency - Plan: VITAMIN D 25 Hydroxy (Vit-D  Deficiency, Fractures), Vitamin B12  Essential hypertension - Plan: Lipid Panel With LDL/HDL Ratio  Prediabetes - Plan: Comprehensive metabolic panel, Hemoglobin A1c, Insulin, random, Lipid Panel With LDL/HDL Ratio  Abnormal TSH - Plan: TSH  At risk for diabetes mellitus  Class 1 obesity with serious comorbidity and body mass index (BMI) of 30.0 to 30.9 in adult, unspecified obesity type  PLAN:  Vitamin D Deficiency Rebekah [name] was informed that low vitamin D levels contributes to fatigue and are associated with obesity, breast, and colon cancer. Rebekah [name] agrees to continue taking prescription Vit D @50 ,000 IU every week and will follow up for routine testing of vitamin D, at least 2-3 times per year. She was informed of the risk of over-replacement of vitamin D and agrees to not increase her dose unless she discusses this with [name] first. We will check Vit D level today. [name] agrees to follow up with our clinic in 2 weeks.  Hypertension We discussed sodium restriction, working on healthy weight loss, and a regular exercise program as the means to achieve improved blood pressure control. Rebekah [name] agreed with this plan and agreed to follow up as directed. We will continue to monitor her blood pressure as well as her progress with the above lifestyle modifications. Rebekah [name] will stop lisinopril and will watch for signs of hypotension as she continues her lifestyle modifications. We will follow up on blood pressure at next appointment. We will check CMP and FLP today. Shalese agrees to follow up with our clinic in 2 weeks.  Pre-Diabetes Rebekah [name] will continue to work on weight loss,  exercise, and decreasing simple carbohydrates in her diet to help decrease the risk of diabetes. We dicussed metformin including benefits and risks. She was informed that eating too many simple carbohydrates or too many calories at one sitting increases the likelihood of GI side effects. Rebekah [name] agrees to continue taking metformin and we will  check Vit B12, Hgb A1c, and insulin today. Rebekah [name] agrees to follow up with our clinic in 2 weeks as directed to monitor her progress.  Diabetes risk counselling Rebekah [name] was given extended (15 minutes) diabetes prevention counseling today. She is 58 y.o. female and has risk factors for diabetes including obesity and pre-diabetes. We discussed intensive lifestyle modifications today with an emphasis on weight loss as well as increasing exercise and decreasing simple carbohydrates in her diet.  Abnormal TSH Rebekah [name] was informed of the importance of good thyroid control to help with weight loss efforts. She was also informed that supertheraputic thyroid levels are dangerous and will not improve weight loss results. We will check TSH today. Rebekah [name] agrees to follow up with our clinic in 2 weeks.  Obesity Rebekah [name] is currently in the action stage of change. As such, her goal is to continue with weight loss efforts She has agreed to follow the Category 2 plan Rebekah [name] has been instructed to work up to a goal of 150 minutes of combined cardio and strengthening exercise per week for weight loss and overall health benefits. We discussed the following Behavioral Modification Strategies today: increasing lean protein intake, increasing vegetables, decrease eating out, work on meal planning and easy cooking plans and avoiding temptations Rebekah [name] plans to start walking on the treadmill at home in the morning.  Rebekah [name] has agreed to follow up with our clinic in 2 weeks. She was informed of the importance of frequent follow up visits to maximize her success with intensive lifestyle modifications for her multiple health conditions.  ALLERGIES: Allergies  Allergen Reactions  . Penicillins Hives and Rash    Has patient had a PCN reaction causing immediate rash, facial/tongue/throat swelling, SOB or lightheadedness with hypotension: Yes Has patient had a PCN reaction causing severe rash involving mucus membranes or skin necrosis:  Yes Has patient had a PCN reaction that required hospitalization: No Has patient had a PCN reaction occurring within the last 10 years: No If all of the above answers are "NO", then may proceed with Cephalosporin use.   . Erythromycin Nausea Only    Gastrointestinal issues.    MEDICATIONS: Current Outpatient Medications on File Prior to Visit  Medication Sig Dispense Refill  . aspirin 81 MG tablet Take 1 tablet (81 mg total) by mouth daily. 90 tablet 3  . Azelaic Acid (FINACEA) 15 % FOAM Apply 1 application topically 2 (two) times daily as needed.     Marland Kitchen DIVIGEL 1 MG/GM GEL Take 1 mg by mouth daily.     Marland Kitchen L-Methylfolate-Algae (DEPLIN 7.5) 7.5-90.314 MG CAPS Take 1 capsule by mouth daily.     . metFORMIN (GLUCOPHAGE) 500 MG tablet Take 1 tablet (500 mg total) by mouth daily with breakfast. 90 tablet 0  . Multiple Vitamin (MULTIVITAMIN) tablet Take 1 tablet by mouth daily. 90 tablet 1  . nortriptyline (PAMELOR) 75 MG capsule Take 1 capsule (75 mg total) by mouth daily. 90 capsule 3  . omeprazole (PRILOSEC) 40 MG capsule Take 1 capsule (40 mg total) by mouth daily. 90 capsule 1  . Probiotic Product (PROBIOTIC-10) CHEW Chew 1 tablet by mouth daily.     . progesterone ([place])  100 MG capsule Take 100 mg by mouth daily.    . ranitidine (ZANTAC) 150 MG tablet Take 150 mg by mouth 2 (two) times daily.    . rosuvastatin (CRESTOR) 10 MG tablet Take 1 tablet (10 mg total) by mouth at bedtime. 90 tablet 1  . Vitamin D, Ergocalciferol, (DRISDOL) 1.25 MG (50000 UT) CAPS capsule Take 1 capsule (50,000 Units total) by mouth every 7 (seven) days. 12 capsule 0   No current facility-administered medications on file prior to visit.     PAST MEDICAL HISTORY: Past Medical History:  Diagnosis Date  . Allergy   . Back pain   . Bowel obstruction ([place]) 2019  . Depression   . Gall bladder stones   . GERD (gastroesophageal reflux disease)   . Hyperlipidemia   . Hypertension   . IBS (irritable bowel  syndrome)   . IBS (irritable bowel syndrome)   . Joint pain   . Obesity    Lap band surgery 12-08 Dr Hassell Done  . OSA (obstructive sleep apnea)    using a CPAP    . Pre-diabetes     PAST SURGICAL HISTORY: Past Surgical History:  Procedure Laterality Date  . APPENDECTOMY    . BREAST REDUCTION SURGERY    . CHOLECYSTECTOMY    . CLOSED REDUCTION FINGER WITH PERCUTANEOUS PINNING Left 09/14/2014   Procedure: CLOSED REDUCTION PERCUTANEOUS PINNING ;  Surgeon: Leanora Cover, MD;  Location: [facility];  Service: Orthopedics;  Laterality: Left;  . ESOPHAGEAL MANOMETRY N/A 09/23/2017   Procedure: ESOPHAGEAL MANOMETRY (EM);  Surgeon: Mauri Pole, MD;  Location: [facility];  Service: Endoscopy;  Laterality: N/A;  . Lap Band Adjustment  09/01/2015   Dr. Redmond Pulling  . LAPAROSCOPIC GASTRIC BANDING  09/2007   Lap band surgery 12-08 Dr Hassell Done  . NASAL SINUS SURGERY    . TONSILLECTOMY     x2    SOCIAL HISTORY: Social History   Tobacco Use  . Smoking status: Never Smoker  . Smokeless tobacco: Never Used  Substance Use Topics  . Alcohol use: Yes    Comment: socially   . Drug use: No    FAMILY HISTORY: Family History  Problem Relation Age of Onset  . Coronary artery disease Father        CABG, smoker .age onset 43s  . Prostate cancer Father   . Pulmonary fibrosis Father        and others  . High blood pressure Father   . High Cholesterol Father   . Depression Father   . Obesity Father   . Breast cancer Mother        age 83  . Heart disease Mother        CABG, smoker .age onset 79s  . Diverticulitis Mother   . High blood pressure Mother   . High Cholesterol Mother   . Thyroid disease Mother   . Depression Mother   . Obesity Mother   . Heart attack Brother        age 84  . Diabetes Neg Hx   . Colon cancer Neg Hx   . Stomach cancer Neg Hx   . Esophageal cancer Neg Hx   . Rectal cancer Neg Hx     ROS: Review of Systems  Constitutional: Positive for  malaise/fatigue. Negative for weight loss.  Cardiovascular: Negative for chest pain and palpitations.       Negative chest pressure  Gastrointestinal: Negative for nausea and vomiting.  Genitourinary: Negative for frequency.  Musculoskeletal:       Negative muscle weakness  Neurological: Negative for dizziness and headaches.       Negative lightheadedness  Endo/Heme/Allergies: Negative for polydipsia.       Negative hypoglycemia Negative hot/cold intolerance    PHYSICAL EXAM: Blood pressure 104/71, pulse 79, temperature 98.5 F (36.9 C), temperature source Oral, height 5\' 4"  (1.626 m), weight 179 lb (81.2 kg), SpO2 97 %. Body mass index is 30.73 kg/m. Physical Exam Vitals signs reviewed.  Constitutional:      Appearance: Normal appearance. She is obese.  Cardiovascular:     Rate and Rhythm: Normal rate.     Pulses: Normal pulses.  Pulmonary:     Effort: Pulmonary effort is normal.     Breath sounds: Normal breath sounds.  Musculoskeletal: Normal range of motion.  Skin:    General: Skin is warm and dry.  Neurological:     Mental Status: She is alert and oriented to person, place, and time.  Psychiatric:        Mood and Affect: Mood normal.        Behavior: Behavior normal.     RECENT LABS AND TESTS: BMET    Component Value Date/Time   NA 141 07/21/2018 1143   K 4.6 07/21/2018 1143   CL 102 07/21/2018 1143   CO2 23 07/21/2018 1143   GLUCOSE 88 07/21/2018 1143   GLUCOSE 74 03/04/2018 0852   GLUCOSE 95 08/24/2010 1048   BUN 10 07/21/2018 1143   CREATININE 0.83 07/21/2018 1143   CALCIUM 9.7 07/21/2018 1143   GFRNONAA 80 07/21/2018 1143   GFRAA 92 07/21/2018 1143   Lab Results  Component Value Date   HGBA1C 6.3 07/10/2018   HGBA1C 6.2 03/04/2018   HGBA1C 6.1 06/05/2017   HGBA1C 6.0 06/14/2016   HGBA1C 6.0 09/23/2015   Lab Results  Component Value Date   INSULIN 38.3 (H) 07/21/2018   CBC    Component Value Date/Time   WBC 9.0 07/21/2018 1143   WBC 8.7  10/16/2017 1140   RBC 4.60 07/21/2018 1143   RBC 4.58 10/16/2017 1140   HGB 12.8 07/21/2018 1143   HCT 39.1 07/21/2018 1143   PLT 325.0 10/16/2017 1140   MCV 85 07/21/2018 1143   MCH 27.8 07/21/2018 1143   MCH 28.0 09/30/2017 0453   MCHC 32.7 07/21/2018 1143   MCHC 32.7 10/16/2017 1140   RDW 14.7 07/21/2018 1143   LYMPHSABS 3.3 (H) 07/21/2018 1143   MONOABS 0.5 10/16/2017 1140   EOSABS 0.2 07/21/2018 1143   BASOSABS 0.1 07/21/2018 1143   Iron/TIBC/Ferritin/ %Sat    Component Value Date/Time   IRON 86 01/07/2015 0902   FERRITIN 19.1 01/07/2015 0902   IRONPCTSAT 23.2 07/30/2008 0856   Lipid Panel     Component Value Date/Time   CHOL 117 07/10/2018 0819   TRIG 77.0 07/10/2018 0819   TRIG 99 08/30/2006 0904   HDL 48.00 07/10/2018 0819   CHOLHDL 2 07/10/2018 0819   VLDL 15.4 07/10/2018 0819   LDLCALC 54 07/10/2018 0819   Hepatic Function Panel     Component Value Date/Time   PROT 7.5 07/21/2018 1143   ALBUMIN 4.5 07/21/2018 1143   AST 24 07/21/2018 1143   ALT 24 07/21/2018 1143   ALKPHOS 87 07/21/2018 1143   BILITOT 0.3 07/21/2018 1143   BILIDIR 0.0 05/15/2012 1453      Component Value Date/Time   TSH 3.130 07/21/2018 1143   TSH 4.65 (H) 07/10/2018 0819   TSH 3.02  06/05/2017 0840      OBESITY BEHAVIORAL INTERVENTION VISIT  Today's visit was # 6   Starting weight: 198 lbs Starting date: 07/21/18 Today's weight : 179 lbs  Today's date: 10/30/2018 Total lbs lost to date: 35    ASK: We discussed the diagnosis of obesity with Isaiah Serge today and Kaija agreed to give [name] permission to discuss obesity behavioral modification therapy today.  ASSESS: Kalia has the diagnosis of obesity and her BMI today is 30.71 Shanequia is in the action stage of change   ADVISE: Marlisha was educated on the multiple health risks of obesity as well as the benefit of weight loss to improve her health. She was advised of the need for long term treatment and the importance of  lifestyle modifications to improve her current health and to decrease her risk of future health problems.  AGREE: Multiple dietary modification options and treatment options were discussed and  Jasmaine agreed to follow the recommendations documented in the above note.  ARRANGE: Catalyna was educated on the importance of frequent visits to treat obesity as outlined per CMS and USPSTF guidelines and agreed to schedule her next follow up appointment today.  I, Trixie Dredge, am acting as transcriptionist for Ilene Qua, MD  I have reviewed the above documentation for accuracy and completeness, and I agree with the above. - Ilene Qua, MD

## 2018-11-02 LAB — COMPREHENSIVE METABOLIC PANEL
ALBUMIN: 5.1 g/dL (ref 3.5–5.5)
ALT: 20 IU/L (ref 0–32)
AST: 24 IU/L (ref 0–40)
Albumin/Globulin Ratio: 1.7 (ref 1.2–2.2)
Alkaline Phosphatase: 80 IU/L (ref 39–117)
BUN/Creatinine Ratio: 13 (ref 9–23)
BUN: 14 mg/dL (ref 6–24)
Bilirubin Total: 0.3 mg/dL (ref 0.0–1.2)
CO2: 21 mmol/L (ref 20–29)
Calcium: 10.4 mg/dL — ABNORMAL HIGH (ref 8.7–10.2)
Chloride: 97 mmol/L (ref 96–106)
Creatinine, Ser: 1.04 mg/dL — ABNORMAL HIGH (ref 0.57–1.00)
GFR calc Af Amer: 69 mL/min/{1.73_m2} (ref >59)
GFR calc non Af Amer: 60 mL/min/{1.73_m2} (ref >59)
Globulin, Total: 3 g/dL (ref 1.5–4.5)
Glucose: 86 mg/dL (ref 65–99)
Potassium: 4.9 mmol/L (ref 3.5–5.2)
SODIUM: 137 mmol/L (ref 134–144)
TOTAL PROTEIN: 8.1 g/dL (ref 6.0–8.5)

## 2018-11-02 LAB — HEMOGLOBIN A1C
Est. average glucose Bld gHb Est-mCnc: 120 mg/dL
Hgb A1c MFr Bld: 5.8 % — ABNORMAL HIGH (ref 4.8–5.6)

## 2018-11-02 LAB — LIPID PANEL WITH LDL/HDL RATIO
Cholesterol, Total: 160 mg/dL (ref 100–199)
HDL: 63 mg/dL (ref >39)
LDL Calculated: 80 mg/dL (ref 0–99)
LDl/HDL Ratio: 1.3 ratio (ref 0.0–3.2)
Triglycerides: 87 mg/dL (ref 0–149)
VLDL CHOLESTEROL CAL: 17 mg/dL (ref 5–40)

## 2018-11-02 LAB — TSH: TSH: 4.58 u[IU]/mL — ABNORMAL HIGH (ref 0.450–4.500)

## 2018-11-02 LAB — VITAMIN D 25 HYDROXY (VIT D DEFICIENCY, FRACTURES): Vit D, 25-Hydroxy: 45.7 ng/mL (ref 30.0–100.0)

## 2018-11-02 LAB — INSULIN, RANDOM: INSULIN: 25.1 u[IU]/mL — ABNORMAL HIGH (ref 2.6–24.9)

## 2018-11-02 LAB — VITAMIN B12: Vitamin B-12: 576 pg/mL (ref 232–1245)

## 2018-11-11 NOTE — Progress Notes (Signed)
Office: [phone]  /  Fax: [phone]    Date: November 20, 2018   Time Seen: 8:55am Duration: 33 minutes Provider: Glennie Isle, Psy.D. Type of Session: Individual Therapy  Type of Contact: Face-to-face  Session Content: Rebekah [name] is a 58 y.o. female presenting for a follow-up appointment to address the previously established treatment goal of decreasing emotional eating. The session was initiated with the administration of the PHQ-9 and GAD-7, as well as a brief check-in. A risk assessment was completed, and Lorie denied suicidal and homicidal ideation, plan, and intent since the last appointment with this provider. Rebekah [name] shared her father passed away shortly after the last appointment with this provider. She disclosed consuming fried chicken and 400 calories worth of sorbet last night. Rebekah [name] noted that during her appointment with Dr. Adair Patter today, she was informed she maintained her weight. Session focused on processing thoughts and feelings associated with her father's passing. Rebekah [name] discussed that last night she consumed sorbet as she had a "stressful day at work" and was also grieving. Despite episodes of emotional eating, Rebekah [name] shared she focused on portion control and was mindful of her protein intake. She further discussed having "good moments and bad moments" since the passing of her father. Additionally, during today's appointment, Rebekah [name] shared memories about her father and also noted her mother has been very supportive. The option of a referral for grief therapy was further discussed; however, Rebekah [name] declined the referral at this time and noted, "I am okay."  Remainder of session focused on thought defusion. This provider briefly reviewed thought defusion as it relates to emotional eating.  [name] was led through an additional exercise today, "Leaves on a Stream." Following the exercise, [name] indicated it was "very soothing."  She was provided a handout for the exercise, and she expressed  willingness to engage in the thought defusion exercise to assist with triggers for emotional eating as well as to assist with grieving. [name] was receptive to today's session as evidenced by openness to sharing, responsiveness to feedback, and engagement in the thought defusion exercise. At the conclusion of today's appointment, [name] reported, "I feel pretty good."   Mental Status Examination: Rebekah [name] arrived early for the appointment. She presented as appropriately dressed and groomed. Rebekah [name] appeared her stated age and demonstrated adequate orientation to time, place, person, and purpose of the appointment. She also demonstrated appropriate eye contact. No psychomotor abnormalities or behavioral peculiarities noted. Her mood was euthymic with congruent affect. Her thought processes were logical, linear, and goal-directed. No hallucinations, delusions, bizarre thinking or behavior reported or observed. Judgment, insight, and impulse control appeared to be grossly intact. There was no evidence of paraphasias (i.e., errors in speech, gross mispronunciations, and word substitutions), repetition deficits, or disturbances in volume or prosody (i.e., rhythm and intonation). There was no evidence of attention or memory impairments. Rebekah [name] denied current suicidal and homicidal ideation, plan and intent.   Structured Assessment Results: The Patient Health Questionnaire-9 (PHQ-9) is a self-report measure that assesses symptoms and severity of depression over the course of the last two weeks. Rebekah [name] obtained a score of 5 suggesting mild depression. [name] finds the endorsed symptoms to be somewhat difficult. Depression screen PHQ 2/9 11/20/2018  Decreased Interest 1  Down, Depressed, Hopeless 1  PHQ - 2 Score 2  Altered sleeping 0  Tired, decreased energy 1  Change in appetite 1  Feeling bad or failure about yourself  0  Trouble concentrating 1  Moving slowly or fidgety/restless 0  Suicidal thoughts 0  PHQ-9 Score 5   Difficult doing work/chores -   The Generalized Anxiety Disorder-7 (GAD-7) is a brief self-report measure that assesses symptoms of anxiety over the course of the last two weeks. Rebekah [name] obtained a score of 8 suggesting mild anxiety. GAD 7 : Generalized Anxiety Score 11/20/2018  Nervous, Anxious, on Edge 1  Control/stop worrying 1  Worry too much - different things 1  Trouble relaxing 1  Restless 1  Easily annoyed or irritable 3  Afraid - awful might happen 0  Total GAD 7 Score 8  Anxiety Difficulty Somewhat difficult   Interventions:  Administration of PHQ-9 and GAD-7 for symptom monitoring Review of content from the previous session Empathic reflections and validation Risk assessment Processing thoughts and feelings Thought defusion exercise Discussed option for a referral for longer-term therapeutic services Positive reinforcement Brief chart review  DSM-5 Diagnosis: 296.33 (F33.2) Major Depressive Disorder, Recurrent Episode, Severe, With Anxious Distress, Severe  Treatment Goal & Progress: During the initial appointment with this provider, the following treatment goal was established: decrease emotional eating. [name] has demonstrated progress in her goal as evidenced by increased awareness of hunger patterns and triggers for emotional eating. Despite recent episodes of emotional eating, Rebekah [name] demonstrated awareness of what triggered the episodes. She also discussed being mindful of portion control and protein intake. Moreover, Rebekah [name] demonstrated willingness to practice thought defusion to assist with triggers for emotional eating as well as grieving.  Plan: Rebekah [name] continues to appear able and willing to participate as evidenced by engagement in reciprocal conversation, and asking questions for clarification as appropriate. The next appointment will be scheduled in two weeks. The next session will focus on reviewing learned skills, and possible termination.

## 2018-11-20 ENCOUNTER — Ambulatory Visit (INDEPENDENT_AMBULATORY_CARE_PROVIDER_SITE_OTHER): Payer: 59 | Admitting: Family Medicine

## 2018-11-20 ENCOUNTER — Encounter (INDEPENDENT_AMBULATORY_CARE_PROVIDER_SITE_OTHER): Payer: Self-pay | Admitting: Family Medicine

## 2018-11-20 ENCOUNTER — Ambulatory Visit (INDEPENDENT_AMBULATORY_CARE_PROVIDER_SITE_OTHER): Payer: 59 | Admitting: Psychology

## 2018-11-20 VITALS — BP 132/88 | HR 73 | Temp 98.1°F | Ht 64.0 in | Wt 179.0 lb

## 2018-11-20 DIAGNOSIS — E7849 Other hyperlipidemia: Secondary | ICD-10-CM | POA: Diagnosis not present

## 2018-11-20 DIAGNOSIS — F332 Major depressive disorder, recurrent severe without psychotic features: Secondary | ICD-10-CM | POA: Diagnosis not present

## 2018-11-20 DIAGNOSIS — E559 Vitamin D deficiency, unspecified: Secondary | ICD-10-CM | POA: Diagnosis not present

## 2018-11-20 DIAGNOSIS — E669 Obesity, unspecified: Secondary | ICD-10-CM

## 2018-11-20 DIAGNOSIS — Z6830 Body mass index (BMI) 30.0-30.9, adult: Secondary | ICD-10-CM

## 2018-11-20 DIAGNOSIS — R7989 Other specified abnormal findings of blood chemistry: Secondary | ICD-10-CM

## 2018-11-23 NOTE — Progress Notes (Signed)
Office: [phone]  /  Fax: [phone]   HPI:   Chief Complaint: OBESITY Rebekah [name] is here to discuss her progress with her obesity treatment plan. She is on the Category 2 plan and is following her eating plan approximately 50 % of the time. She states she is walking for 20 minutes 2 times per week. Rebekah [name]'s father passed away within the past two weeks and she ate quite a bit of fried chicken because that was in the house. She has gotten back on track with the meal plan. She has a few appointments in the next few weeks.  Her weight is 179 lb (81.2 kg) today and has not lost weight since her last visit. She has lost 19 lbs since starting treatment with [name].  Vitamin D Deficiency Rebekah [name] has a diagnosis of vitamin D deficiency. She is currently taking prescription Vit D. She notes fatigue and denies nausea, vomiting or muscle weakness.  Hyperlipidemia Rebekah [name] has hyperlipidemia and has been trying to improve her cholesterol levels with intensive lifestyle modification including a low saturated fat diet, exercise and weight loss. She is on Crestor and denies any chest pain, claudication or myalgias.  Elevated TSH Rebekah [name] has a diagnosis of elevated TSH of 4.580. She is on levothyroxine. She denies hot or cold intolerance or palpitations, but does admit to ongoing fatigue.  ASSESSMENT AND PLAN:  Vitamin D deficiency  Other hyperlipidemia  Elevated TSH  Class 1 obesity with serious comorbidity and body mass index (BMI) of 30.0 to 30.9 in adult, unspecified obesity type  PLAN:  Vitamin D Deficiency Rebekah [name] was informed that low vitamin D levels contributes to fatigue and are associated with obesity, breast, and colon cancer. Rebekah [name] agrees to continue taking prescription Vit D @50 ,000 IU every week, no refill needed. She will follow up for routine testing of vitamin D, at least 2-3 times per year. She was informed of the risk of over-replacement of vitamin D and agrees to not increase her dose  unless she discusses this with [name] first. Rebekah [name] agrees to follow up with our clinic in 2 weeks.  Hyperlipidemia Rebekah [name] was informed of the American Heart Association Guidelines emphasizing intensive lifestyle modifications as the first line treatment for hyperlipidemia. We discussed many lifestyle modifications today in depth, and Rebekah [name] will continue to work on decreasing saturated fats such as fatty red meat, butter and many fried foods. Rebekah [name] agrees to continue taking Crestor, and she will also increase vegetables and lean protein in her diet and continue to work on exercise and weight loss efforts. Rebekah [name] agrees to follow up with our clinic in 2 weeks.  Elevated TSH Rebekah [name] was informed of the importance of good thyroid control to help with weight loss efforts. She was also informed that supertheraputic thyroid levels are dangerous and will not improve weight loss results. We will repeat TSH in the next 2 visits. Rebekah [name] agrees to follow up with our clinic in 2 weeks.  I spent > than 50% of the 25 minute visit on counseling as documented in the note.  Obesity Rebekah [name] is currently in the action stage of change. As such, her goal is to continue with weight loss efforts She has agreed to follow the Category 2 plan Rebekah [name] has been instructed to work up to a goal of 150 minutes of combined cardio and strengthening exercise per week for weight loss and overall health benefits. We discussed the following Behavioral Modification Strategies today: increasing lean protein intake, increasing vegetables, work on meal planning and  easy cooking plans, emotional eating strategies, and planning for success Rebekah [name] is going to add resistance training in form of cutting down trees and splitting wood.  Rebekah [name] has agreed to follow up with our clinic in 2 weeks. She was informed of the importance of frequent follow up visits to maximize her success with intensive lifestyle modifications for her multiple health  conditions.  ALLERGIES: Allergies  Allergen Reactions  . Penicillins Hives and Rash    Has patient had a PCN reaction causing immediate rash, facial/tongue/throat swelling, SOB or lightheadedness with hypotension: Yes Has patient had a PCN reaction causing severe rash involving mucus membranes or skin necrosis: Yes Has patient had a PCN reaction that required hospitalization: No Has patient had a PCN reaction occurring within the last 10 years: No If all of the above answers are "NO", then may proceed with Cephalosporin use.   . Erythromycin Nausea Only    Gastrointestinal issues.    MEDICATIONS: Current Outpatient Medications on File Prior to Visit  Medication Sig Dispense Refill  . aspirin 81 MG tablet Take 1 tablet (81 mg total) by mouth daily. 90 tablet 3  . Azelaic Acid (FINACEA) 15 % FOAM Apply 1 application topically 2 (two) times daily as needed.     Marland Kitchen DIVIGEL 1 MG/GM GEL Take 1 mg by mouth daily.     Marland Kitchen L-Methylfolate-Algae (DEPLIN 7.5) 7.5-90.314 MG CAPS Take 1 capsule by mouth daily.     . metFORMIN (GLUCOPHAGE) 500 MG tablet Take 1 tablet (500 mg total) by mouth daily with breakfast. 90 tablet 0  . Multiple Vitamin (MULTIVITAMIN) tablet Take 1 tablet by mouth daily. 90 tablet 1  . nortriptyline (PAMELOR) 75 MG capsule Take 1 capsule (75 mg total) by mouth daily. 90 capsule 3  . omeprazole (PRILOSEC) 40 MG capsule Take 1 capsule (40 mg total) by mouth daily. 90 capsule 1  . Probiotic Product (PROBIOTIC-10) CHEW Chew 1 tablet by mouth daily.     . progesterone (PROMETRIUM) 100 MG capsule Take 100 mg by mouth daily.    . ranitidine (ZANTAC) 150 MG tablet Take 150 mg by mouth 2 (two) times daily.    . rosuvastatin (CRESTOR) 10 MG tablet Take 1 tablet (10 mg total) by mouth at bedtime. 90 tablet 1  . Vitamin D, Ergocalciferol, (DRISDOL) 1.25 MG (50000 UT) CAPS capsule Take 1 capsule (50,000 Units total) by mouth every 7 (seven) days. 12 capsule 0   No current  facility-administered medications on file prior to visit.     PAST MEDICAL HISTORY: Past Medical History:  Diagnosis Date  . Allergy   . Back pain   . Bowel obstruction ([place]) 2019  . Depression   . Gall bladder stones   . GERD (gastroesophageal reflux disease)   . Hyperlipidemia   . Hypertension   . IBS (irritable bowel syndrome)   . IBS (irritable bowel syndrome)   . Joint pain   . Obesity    Lap band surgery 12-08 Dr Hassell Done  . OSA (obstructive sleep apnea)    using a CPAP    . Pre-diabetes     PAST SURGICAL HISTORY: Past Surgical History:  Procedure Laterality Date  . APPENDECTOMY    . BREAST REDUCTION SURGERY    . CHOLECYSTECTOMY    . CLOSED REDUCTION FINGER WITH PERCUTANEOUS PINNING Left 09/14/2014   Procedure: CLOSED REDUCTION PERCUTANEOUS PINNING ;  Surgeon: Leanora Cover, MD;  Location: [facility];  Service: Orthopedics;  Laterality: Left;  . ESOPHAGEAL MANOMETRY  N/A 09/23/2017   Procedure: ESOPHAGEAL MANOMETRY (EM);  Surgeon: Mauri Pole, MD;  Location: [facility];  Service: Endoscopy;  Laterality: N/A;  . Lap Band Adjustment  09/01/2015   Dr. Redmond Pulling  . LAPAROSCOPIC GASTRIC BANDING  09/2007   Lap band surgery 12-08 Dr Hassell Done  . NASAL SINUS SURGERY    . TONSILLECTOMY     x2    SOCIAL HISTORY: Social History   Tobacco Use  . Smoking status: Never Smoker  . Smokeless tobacco: Never Used  Substance Use Topics  . Alcohol use: Yes    Comment: socially   . Drug use: No    FAMILY HISTORY: Family History  Problem Relation Age of Onset  . Coronary artery disease Father        CABG, smoker .age onset 22s  . Prostate cancer Father   . Pulmonary fibrosis Father        and others  . High blood pressure Father   . High Cholesterol Father   . Depression Father   . Obesity Father   . Breast cancer Mother        age 53  . Heart disease Mother        CABG, smoker .age onset 64s  . Diverticulitis Mother   . High blood pressure Mother    . High Cholesterol Mother   . Thyroid disease Mother   . Depression Mother   . Obesity Mother   . Heart attack Brother        age 97  . Diabetes Neg Hx   . Colon cancer Neg Hx   . Stomach cancer Neg Hx   . Esophageal cancer Neg Hx   . Rectal cancer Neg Hx     ROS: Review of Systems  Constitutional: Positive for malaise/fatigue. Negative for weight loss.  Cardiovascular: Negative for chest pain, palpitations and claudication.  Gastrointestinal: Negative for nausea and vomiting.  Musculoskeletal: Negative for myalgias.       Negative muscle weakness  Endo/Heme/Allergies:       Negative hot/cold intolerance    PHYSICAL EXAM: Blood pressure 132/88, pulse 73, temperature 98.1 F (36.7 C), temperature source Oral, height 5\' 4"  (1.626 m), weight 179 lb (81.2 kg), SpO2 98 %. Body mass index is 30.73 kg/m. Physical Exam Vitals signs reviewed.  Constitutional:      Appearance: Normal appearance. She is obese.  Cardiovascular:     Rate and Rhythm: Normal rate.     Pulses: Normal pulses.  Pulmonary:     Effort: Pulmonary effort is normal.     Breath sounds: Normal breath sounds.  Musculoskeletal: Normal range of motion.  Skin:    General: Skin is warm and dry.  Neurological:     Mental Status: She is alert and oriented to person, place, and time.  Psychiatric:        Mood and Affect: Mood normal.        Behavior: Behavior normal.     RECENT LABS AND TESTS: BMET    Component Value Date/Time   NA 137 10/30/2018 1011   K 4.9 10/30/2018 1011   CL 97 10/30/2018 1011   CO2 21 10/30/2018 1011   GLUCOSE 86 10/30/2018 1011   GLUCOSE 74 03/04/2018 0852   GLUCOSE 95 08/24/2010 1048   BUN 14 10/30/2018 1011   CREATININE 1.04 (H) 10/30/2018 1011   CALCIUM 10.4 (H) 10/30/2018 1011   GFRNONAA 60 10/30/2018 1011   GFRAA 69 10/30/2018 1011   Lab Results  Component  Value Date   HGBA1C 5.8 (H) 10/30/2018   HGBA1C 6.3 07/10/2018   HGBA1C 6.2 03/04/2018   HGBA1C 6.1  06/05/2017   HGBA1C 6.0 06/14/2016   Lab Results  Component Value Date   INSULIN 25.1 (H) 10/30/2018   INSULIN 38.3 (H) 07/21/2018   CBC    Component Value Date/Time   WBC 9.0 07/21/2018 1143   WBC 8.7 10/16/2017 1140   RBC 4.60 07/21/2018 1143   RBC 4.58 10/16/2017 1140   HGB 12.8 07/21/2018 1143   HCT 39.1 07/21/2018 1143   PLT 325.0 10/16/2017 1140   MCV 85 07/21/2018 1143   MCH 27.8 07/21/2018 1143   MCH 28.0 09/30/2017 0453   MCHC 32.7 07/21/2018 1143   MCHC 32.7 10/16/2017 1140   RDW 14.7 07/21/2018 1143   LYMPHSABS 3.3 (H) 07/21/2018 1143   MONOABS 0.5 10/16/2017 1140   EOSABS 0.2 07/21/2018 1143   BASOSABS 0.1 07/21/2018 1143   Iron/TIBC/Ferritin/ %Sat    Component Value Date/Time   IRON 86 01/07/2015 0902   FERRITIN 19.1 01/07/2015 0902   IRONPCTSAT 23.2 07/30/2008 0856   Lipid Panel     Component Value Date/Time   CHOL 160 10/30/2018 1011   TRIG 87 10/30/2018 1011   TRIG 99 08/30/2006 0904   HDL 63 10/30/2018 1011   CHOLHDL 2 07/10/2018 0819   VLDL 15.4 07/10/2018 0819   LDLCALC 80 10/30/2018 1011   Hepatic Function Panel     Component Value Date/Time   PROT 8.1 10/30/2018 1011   ALBUMIN 5.1 10/30/2018 1011   AST 24 10/30/2018 1011   ALT 20 10/30/2018 1011   ALKPHOS 80 10/30/2018 1011   BILITOT 0.3 10/30/2018 1011   BILIDIR 0.0 05/15/2012 1453      Component Value Date/Time   TSH 4.580 (H) 10/30/2018 1011   TSH 3.130 07/21/2018 1143   TSH 4.65 (H) 07/10/2018 0819      OBESITY BEHAVIORAL INTERVENTION VISIT  Today's visit was # 7   Starting weight: 198 lbs Starting date: 07/21/18 Today's weight : 179 lbs Today's date: 11/20/2018 Total lbs lost to date: 69    ASK: We discussed the diagnosis of obesity with Rebekah [name] today and Rebekah [name] agreed to give [name] permission to discuss obesity behavioral modification therapy today.  ASSESS: Rebekah [name] has the diagnosis of obesity and her BMI today is 30.71 Rebekah [name] is in the action stage of change    ADVISE: Rebekah [name] was educated on the multiple health risks of obesity as well as the benefit of weight loss to improve her health. She was advised of the need for long term treatment and the importance of lifestyle modifications to improve her current health and to decrease her risk of future health problems.  AGREE: Multiple dietary modification options and treatment options were discussed and  Rebekah [name] agreed to follow the recommendations documented in the above note.  ARRANGE: Rebekah [name] was educated on the importance of frequent visits to treat obesity as outlined per CMS and USPSTF guidelines and agreed to schedule her next follow up appointment today.  I, Trixie Dredge, am acting as transcriptionist for Ilene Qua, MD  I have reviewed the above documentation for accuracy and completeness, and I agree with the above. - Ilene Qua, MD

## 2018-11-26 ENCOUNTER — Ambulatory Visit: Payer: 59 | Admitting: Neurology

## 2018-12-03 ENCOUNTER — Other Ambulatory Visit: Payer: Self-pay

## 2018-12-03 MED ORDER — OMEPRAZOLE 40 MG PO CPDR: 40.0000 mg | DELAYED_RELEASE_CAPSULE | Freq: Every day | ORAL | 3 refills | Status: DC

## 2018-12-04 NOTE — Progress Notes (Signed)
Office: [phone]  /  Fax: [phone]    Date: December 08, 2018   Time Seen: 9:30am Duration: 32 minutes Provider: Glennie Isle, Psy.D. Type of Session: Individual Therapy  Type of Contact: Face-to-face  Session Content: Rebekah [name] is a 58 y.o. female presenting for a follow-up appointment to address the previously established treatment goal of decreasing emotional eating. The session was initiated with the administration of the PHQ-9 and GAD-7, as well as a brief check-in. Rebekah [name] disclosed that following her last appointment with this provider, she had to euthanize her dog. She discussed experiencing difficulty grieving, including thoughts such as "I'm not that person" and "I should be able to handle it." This provider assisted Rebekah [name] in processing associated thoughts and feelings. She further expressed difficulty with concentration and restful sleep. Rebekah [name] was also led through a thought defusion exercise for which she utilized the thought, "I am lost."  Rebekah [name] discussed secondary to her grief, she has been engaging in emotional eating in the form of consuming chocolate. Nevertheless, Rebekah [name] discussed focusing on her protein intake, and discussed a plan to speak with Dr. Adair Patter about the aforementioned further during their appointment today.  Moreover, this provider discussed engaging in pleasurable activities to help cope with grief and also when when experiencing triggers for emotional eating. Rebekah [name] was receptive and disclosed a plan to read. Due to the recent losses Rebekah [name] has experienced, this provider further discussed the option of a referral for longer-term therapeutic services to focus on grief. Rebekah [name] was receptive and expressed a plan to speak with hospice to initiate grief therapy. She was also receptive to meeting with this provider for an additional appointment as she gets established with hospice. Rebekah [name] was receptive to today's session as evidenced by openness to sharing, responsiveness to  feedback, and engagement in the thought defusion exercise.  Mental Status Examination: Rebekah [name] arrived on time for the appointment. She presented as appropriately dressed and groomed. Rebekah [name] appeared her stated age and demonstrated adequate orientation to time, place, person, and purpose of the appointment. She also demonstrated appropriate eye contact. No psychomotor abnormalities or behavioral peculiarities noted. Her mood was euthymic with congruent affect; however, she was observed becoming tearful when discussing her dog. Her thought processes were logical, linear, and goal-directed. No hallucinations, delusions, bizarre thinking or behavior reported or observed. Judgment, insight, and impulse control appeared to be grossly intact. There was no evidence of paraphasias (i.e., errors in speech, gross mispronunciations, and word substitutions), repetition deficits, or disturbances in volume or prosody (i.e., rhythm and intonation). There was no evidence of attention or memory impairments. Rebekah [name] denied current suicidal and homicidal ideation, plan and intent.   Structured Assessment Results: The Patient Health Questionnaire-9 (PHQ-9) is a self-report measure that assesses symptoms and severity of depression over the course of the last two weeks. Rebekah [name] obtained a score of 13 suggesting moderate depression. Rebekah [name] finds the endorsed symptoms to be somewhat difficult. Depression screen PHQ 2/9 12/08/2018  Decreased Interest 1  Down, Depressed, Hopeless 1  PHQ - 2 Score 2  Altered sleeping 2  Tired, decreased energy 2  Change in appetite 2  Feeling bad or failure about yourself  1  Trouble concentrating 2  Moving slowly or fidgety/restless 2  Suicidal thoughts 0  PHQ-9 Score 13  Difficult doing work/chores -   The Generalized Anxiety Disorder-7 (GAD-7) is a brief self-report measure that assesses symptoms of anxiety over the course of the last two weeks. Rebekah [name] obtained a score of 12 suggesting moderate  anxiety.  GAD 7 : Generalized Anxiety Score 12/08/2018  Nervous, Anxious, on Edge 1  Control/stop worrying 1  Worry too much - different things 2  Trouble relaxing 2  Restless 2  Easily annoyed or irritable 3  Afraid - awful might happen 1  Total GAD 7 Score 12  Anxiety Difficulty Very difficult   Interventions:  Administration of PHQ-9 and GAD-7 for symptom monitoring Review of content from the previous session Empathic reflections and validation Processing thoughts and feelings Thought defusion exercise Discussed option for a referral for longer-term therapeutic services Brief chart review  DSM-5 Diagnosis: 296.33 (F33.2) Major Depressive Disorder, Recurrent Episode, Severe, With Anxious Distress, Severe  Treatment Goal & Progress: During the initial appointment with this provider, the following treatment goal was established: decrease emotional eating. Rebekah [name] has demonstrated progress in her goal as evidenced by increased awareness of hunger patterns and triggers for emotional eating. Despite Rebekah [name] recently engaging in emotional eating, there has been an overall reduction in emotional eating since onset of treatment. Rebekah [name] also continues to demonstrate willingness to engage in learned skills to assist with coping.  Plan: Rebekah [name] continues to appear able and willing to participate as evidenced by engagement in reciprocal conversation, and asking questions for clarification as appropriate. Due to Rebekah [name]'s recent loss, today's appointment did not focus on termination. As such, the next appointment will be scheduled in two weeks. The next session will focus on reviewing learned skills, and termination. This provider will also check-in on the status of Rebekah [name] establishing care for grief therapy via hospice.

## 2018-12-08 ENCOUNTER — Ambulatory Visit (INDEPENDENT_AMBULATORY_CARE_PROVIDER_SITE_OTHER): Payer: 59 | Admitting: Psychology

## 2018-12-08 ENCOUNTER — Encounter (INDEPENDENT_AMBULATORY_CARE_PROVIDER_SITE_OTHER): Payer: Self-pay | Admitting: Family Medicine

## 2018-12-08 ENCOUNTER — Ambulatory Visit (INDEPENDENT_AMBULATORY_CARE_PROVIDER_SITE_OTHER): Payer: 59 | Admitting: Family Medicine

## 2018-12-08 VITALS — BP 144/85 | HR 70 | Temp 97.5°F | Ht 64.0 in | Wt 179.0 lb

## 2018-12-08 DIAGNOSIS — R7303 Prediabetes: Secondary | ICD-10-CM | POA: Diagnosis not present

## 2018-12-08 DIAGNOSIS — E669 Obesity, unspecified: Secondary | ICD-10-CM | POA: Diagnosis not present

## 2018-12-08 DIAGNOSIS — F332 Major depressive disorder, recurrent severe without psychotic features: Secondary | ICD-10-CM | POA: Diagnosis not present

## 2018-12-08 DIAGNOSIS — E559 Vitamin D deficiency, unspecified: Secondary | ICD-10-CM | POA: Diagnosis not present

## 2018-12-08 DIAGNOSIS — Z6830 Body mass index (BMI) 30.0-30.9, adult: Secondary | ICD-10-CM

## 2018-12-08 NOTE — Progress Notes (Signed)
Office: [phone]  /  Fax: [phone]   HPI:   Chief Complaint: OBESITY Rebekah [name] is here to discuss her progress with her obesity treatment plan. She is on the Category 2 plan and is following her eating plan approximately 50 % of the time. She states she is walking for 20 minutes 2-3 times per week. Rebekah [name] had heightened emotional stress secondary to having to put her dog down. She struggled with emotional snacking on chocolate.  Her weight is 179 lb (81.2 kg) today and has not lost weight since her last visit. She has lost 19 lbs since starting treatment with [name].  Vitamin D Deficiency Rebekah [name] has a diagnosis of vitamin D deficiency. She is currently taking prescription Vit D. She notes fatigue and denies nausea, vomiting or muscle weakness.  Pre-Diabetes Rebekah [name] has a diagnosis of pre-diabetes based on her elevated Hgb A1c and was informed this puts her at greater risk of developing diabetes. She notes carbohydrate cravings secondary to emotional eating. She denies side effects of metformin. She continues to work on diet and exercise to decrease risk of diabetes. She denies hypoglycemia.  ASSESSMENT AND PLAN:  Vitamin D deficiency  Prediabetes  Class 1 obesity with serious comorbidity and body mass index (BMI) of 30.0 to 30.9 in adult, unspecified obesity type  PLAN:  Vitamin D Deficiency [name] was informed that low vitamin D levels contributes to fatigue and are associated with obesity, breast, and colon cancer. Rebekah [name] agrees to continue taking prescription Vit D @50 ,000 IU every week and will follow up for routine testing of vitamin D, at least 2-3 times per year. She was informed of the risk of over-replacement of vitamin D and agrees to not increase her dose unless she discusses this with [name] first. Rebekah [name] agrees to follow up with our clinic in 2 weeks.  Pre-Diabetes Rebekah [name] will continue to work on weight loss, exercise, and decreasing simple carbohydrates in her diet to help  decrease the risk of diabetes. We dicussed metformin including benefits and risks. She was informed that eating too many simple carbohydrates or too many calories at one sitting increases the likelihood of GI side effects. Rebekah [name] agrees to continue taking metformin, and she agrees to follow up with our clinic in 2 weeks as directed to monitor her progress.  I spent > than 50% of the 15 minute visit on counseling as documented in the note.  Obesity Rebekah [name] is currently in the action stage of change. As such, her goal is to continue with weight loss efforts She has agreed to follow the Category 2 plan Rebekah [name] has been instructed to work up to a goal of 150 minutes of combined cardio and strengthening exercise per week for weight loss and overall health benefits. We discussed the following Behavioral Modification Strategies today: increasing lean protein intake, increasing vegetables, work on meal planning and easy cooking plans, better snacking choices, and planning for success Rebekah [name] wants to refocus on Category 2 and decrease snacking.  Rebekah [name] has agreed to follow up with our clinic in 2 weeks. She was informed of the importance of frequent follow up visits to maximize her success with intensive lifestyle modifications for her multiple health conditions.  ALLERGIES: Allergies  Allergen Reactions  . Penicillins Hives and Rash    Has patient had a PCN reaction causing immediate rash, facial/tongue/throat swelling, SOB or lightheadedness with hypotension: Yes Has patient had a PCN reaction causing severe rash involving mucus membranes or skin necrosis: Yes Has patient had a PCN reaction  that required hospitalization: No Has patient had a PCN reaction occurring within the last 10 years: No If all of the above answers are "NO", then may proceed with Cephalosporin use.   . Erythromycin Nausea Only    Gastrointestinal issues.    MEDICATIONS: Current Outpatient Medications on File Prior to Visit    Medication Sig Dispense Refill  . aspirin 81 MG tablet Take 1 tablet (81 mg total) by mouth daily. 90 tablet 3  . Azelaic Acid (FINACEA) 15 % FOAM Apply 1 application topically 2 (two) times daily as needed.     Marland Kitchen DIVIGEL 1 MG/GM GEL Take 1 mg by mouth daily.     Marland Kitchen L-Methylfolate-Algae (DEPLIN 7.5) 7.5-90.314 MG CAPS Take 1 capsule by mouth daily.     . metFORMIN (GLUCOPHAGE) 500 MG tablet Take 1 tablet (500 mg total) by mouth daily with breakfast. 90 tablet 0  . Multiple Vitamin (MULTIVITAMIN) tablet Take 1 tablet by mouth daily. 90 tablet 1  . nortriptyline (PAMELOR) 75 MG capsule Take 1 capsule (75 mg total) by mouth daily. 90 capsule 3  . omeprazole (PRILOSEC) 40 MG capsule Take 1 capsule (40 mg total) by mouth daily. 90 capsule 3  . Probiotic Product (PROBIOTIC-10) CHEW Chew 1 tablet by mouth daily.     . progesterone (PROMETRIUM) 100 MG capsule Take 100 mg by mouth daily.    . ranitidine (ZANTAC) 150 MG tablet Take 150 mg by mouth 2 (two) times daily.    . rosuvastatin (CRESTOR) 10 MG tablet Take 1 tablet (10 mg total) by mouth at bedtime. 90 tablet 1  . Vitamin D, Ergocalciferol, (DRISDOL) 1.25 MG (50000 UT) CAPS capsule Take 1 capsule (50,000 Units total) by mouth every 7 (seven) days. 12 capsule 0   No current facility-administered medications on file prior to visit.     PAST MEDICAL HISTORY: Past Medical History:  Diagnosis Date  . Allergy   . Back pain   . Bowel obstruction ([place]) 2019  . Depression   . Gall bladder stones   . GERD (gastroesophageal reflux disease)   . Hyperlipidemia   . Hypertension   . IBS (irritable bowel syndrome)   . IBS (irritable bowel syndrome)   . Joint pain   . Obesity    Lap band surgery 12-08 Dr Hassell Done  . OSA (obstructive sleep apnea)    using a CPAP    . Pre-diabetes     PAST SURGICAL HISTORY: Past Surgical History:  Procedure Laterality Date  . APPENDECTOMY    . BREAST REDUCTION SURGERY    . CHOLECYSTECTOMY    . CLOSED REDUCTION  FINGER WITH PERCUTANEOUS PINNING Left 09/14/2014   Procedure: CLOSED REDUCTION PERCUTANEOUS PINNING ;  Surgeon: Leanora Cover, MD;  Location: [facility];  Service: Orthopedics;  Laterality: Left;  . ESOPHAGEAL MANOMETRY N/A 09/23/2017   Procedure: ESOPHAGEAL MANOMETRY (EM);  Surgeon: Mauri Pole, MD;  Location: [facility];  Service: Endoscopy;  Laterality: N/A;  . Lap Band Adjustment  09/01/2015   Dr. Redmond Pulling  . LAPAROSCOPIC GASTRIC BANDING  09/2007   Lap band surgery 12-08 Dr Hassell Done  . NASAL SINUS SURGERY    . TONSILLECTOMY     x2    SOCIAL HISTORY: Social History   Tobacco Use  . Smoking status: Never Smoker  . Smokeless tobacco: Never Used  Substance Use Topics  . Alcohol use: Yes    Comment: socially   . Drug use: No    FAMILY HISTORY: Family History  Problem Relation Age of Onset  . Coronary artery disease Father        CABG, smoker .age onset 44s  . Prostate cancer Father   . Pulmonary fibrosis Father        and others  . High blood pressure Father   . High Cholesterol Father   . Depression Father   . Obesity Father   . Breast cancer Mother        age 60  . Heart disease Mother        CABG, smoker .age onset 32s  . Diverticulitis Mother   . High blood pressure Mother   . High Cholesterol Mother   . Thyroid disease Mother   . Depression Mother   . Obesity Mother   . Heart attack Brother        age 50  . Diabetes Neg Hx   . Colon cancer Neg Hx   . Stomach cancer Neg Hx   . Esophageal cancer Neg Hx   . Rectal cancer Neg Hx     ROS: Review of Systems  Constitutional: Positive for malaise/fatigue. Negative for weight loss.  Gastrointestinal: Negative for nausea and vomiting.  Musculoskeletal:       Negative muscle weakness    PHYSICAL EXAM: Blood pressure (!) 144/85, pulse 70, temperature (!) 97.5 F (36.4 C), temperature source Oral, height 5\' 4"  (1.626 m), weight 179 lb (81.2 kg), SpO2 97 %. Body mass index is 30.73  kg/m. Physical Exam Vitals signs reviewed.  Constitutional:      Appearance: Normal appearance. She is obese.  Cardiovascular:     Rate and Rhythm: Normal rate.     Pulses: Normal pulses.  Pulmonary:     Effort: Pulmonary effort is normal.     Breath sounds: Normal breath sounds.  Musculoskeletal: Normal range of motion.  Skin:    General: Skin is warm and dry.  Neurological:     Mental Status: She is alert and oriented to person, place, and time.  Psychiatric:        Mood and Affect: Mood normal.        Behavior: Behavior normal.     RECENT LABS AND TESTS: BMET    Component Value Date/Time   NA 137 10/30/2018 1011   K 4.9 10/30/2018 1011   CL 97 10/30/2018 1011   CO2 21 10/30/2018 1011   GLUCOSE 86 10/30/2018 1011   GLUCOSE 74 03/04/2018 0852   GLUCOSE 95 08/24/2010 1048   BUN 14 10/30/2018 1011   CREATININE 1.04 (H) 10/30/2018 1011   CALCIUM 10.4 (H) 10/30/2018 1011   GFRNONAA 60 10/30/2018 1011   GFRAA 69 10/30/2018 1011   Lab Results  Component Value Date   HGBA1C 5.8 (H) 10/30/2018   HGBA1C 6.3 07/10/2018   HGBA1C 6.2 03/04/2018   HGBA1C 6.1 06/05/2017   HGBA1C 6.0 06/14/2016   Lab Results  Component Value Date   INSULIN 25.1 (H) 10/30/2018   INSULIN 38.3 (H) 07/21/2018   CBC    Component Value Date/Time   WBC 9.0 07/21/2018 1143   WBC 8.7 10/16/2017 1140   RBC 4.60 07/21/2018 1143   RBC 4.58 10/16/2017 1140   HGB 12.8 07/21/2018 1143   HCT 39.1 07/21/2018 1143   PLT 325.0 10/16/2017 1140   MCV 85 07/21/2018 1143   MCH 27.8 07/21/2018 1143   MCH 28.0 09/30/2017 0453   MCHC 32.7 07/21/2018 1143   MCHC 32.7 10/16/2017 1140   RDW 14.7 07/21/2018 1143  LYMPHSABS 3.3 (H) 07/21/2018 1143   MONOABS 0.5 10/16/2017 1140   EOSABS 0.2 07/21/2018 1143   BASOSABS 0.1 07/21/2018 1143   Iron/TIBC/Ferritin/ %Sat    Component Value Date/Time   IRON 86 01/07/2015 0902   FERRITIN 19.1 01/07/2015 0902   IRONPCTSAT 23.2 07/30/2008 0856   Lipid Panel      Component Value Date/Time   CHOL 160 10/30/2018 1011   TRIG 87 10/30/2018 1011   TRIG 99 08/30/2006 0904   HDL 63 10/30/2018 1011   CHOLHDL 2 07/10/2018 0819   VLDL 15.4 07/10/2018 0819   LDLCALC 80 10/30/2018 1011   Hepatic Function Panel     Component Value Date/Time   PROT 8.1 10/30/2018 1011   ALBUMIN 5.1 10/30/2018 1011   AST 24 10/30/2018 1011   ALT 20 10/30/2018 1011   ALKPHOS 80 10/30/2018 1011   BILITOT 0.3 10/30/2018 1011   BILIDIR 0.0 05/15/2012 1453      Component Value Date/Time   TSH 4.580 (H) 10/30/2018 1011   TSH 3.130 07/21/2018 1143   TSH 4.65 (H) 07/10/2018 0819      OBESITY BEHAVIORAL INTERVENTION VISIT  Today's visit was # 8   Starting weight: 198 lbs Starting date: 07/21/18 Today's weight : 179 lbs  Today's date: 12/08/2018 Total lbs lost to date: 56    ASK: We discussed the diagnosis of obesity with Isaiah Serge today and Samatha agreed to give [name] permission to discuss obesity behavioral modification therapy today.  ASSESS: Charron has the diagnosis of obesity and her BMI today is 30.71 Holliday is in the action stage of change   ADVISE: Shaquera was educated on the multiple health risks of obesity as well as the benefit of weight loss to improve her health. She was advised of the need for long term treatment and the importance of lifestyle modifications to improve her current health and to decrease her risk of future health problems.  AGREE: Multiple dietary modification options and treatment options were discussed and  Dorianne agreed to follow the recommendations documented in the above note.  ARRANGE: Keyira was educated on the importance of frequent visits to treat obesity as outlined per CMS and USPSTF guidelines and agreed to schedule her next follow up appointment today.  I, Trixie Dredge, am acting as transcriptionist for Ilene Qua, MD  I have reviewed the above documentation for accuracy and completeness, and I agree with the  above. - Ilene Qua, MD

## 2018-12-24 ENCOUNTER — Ambulatory Visit (INDEPENDENT_AMBULATORY_CARE_PROVIDER_SITE_OTHER): Payer: Self-pay | Admitting: Family Medicine

## 2018-12-24 ENCOUNTER — Ambulatory Visit (INDEPENDENT_AMBULATORY_CARE_PROVIDER_SITE_OTHER): Payer: Self-pay | Admitting: Psychology

## 2019-01-08 NOTE — Progress Notes (Unsigned)
  Office: [phone]  /  Fax: [phone]    Date: 01/12/2019   Time Seen:*** Duration:*** Provider: Glennie Isle, Psy.D. Type of Session: Individual Therapy  Type of Contact: Face-to-face  Session Content: Rebekah [name] is a 58 y.o. female presenting for a follow-up appointment to address the previously established treatment goal of decreasing emotional eating. The session was initiated with the administration of the PHQ-9 and GAD-7, as well as a brief check-in. *** Rebekah [name] was receptive to today's session as evidenced by openness to sharing, responsiveness to feedback, and ***.  Mental Status Examination: Rebekah [name] arrived on time for the appointment. She presented as appropriately dressed and groomed. Rebekah [name] appeared her stated age and demonstrated adequate orientation to time, place, person, and purpose of the appointment. She also demonstrated appropriate eye contact. No psychomotor abnormalities or behavioral peculiarities noted. Her mood was {gbmood:21757} with congruent affect. Her thought processes were logical, linear, and goal-directed. No hallucinations, delusions, bizarre thinking or behavior reported or observed. Judgment, insight, and impulse control appeared to be grossly intact. There was no evidence of paraphasias (i.e., errors in speech, gross mispronunciations, and word substitutions), repetition deficits, or disturbances in volume or prosody (i.e., rhythm and intonation). There was no evidence of attention or memory impairments. Rebekah [name] denied current suicidal and homicidal ideation, plan and intent.   Structured Assessment Results: The Patient Health Questionnaire-9 (PHQ-9) is a self-report measure that assesses symptoms and severity of depression over the course of the last two weeks. Rebekah [name] obtained a score of *** suggesting {GBPHQ9SEVERITY:21752}. Rebekah [name] finds the endorsed symptoms to be {gbphq9difficulty:21754}.  The Generalized Anxiety Disorder-7 (GAD-7) is a brief self-report measure  that assesses symptoms of anxiety over the course of the last two weeks. Rebekah [name] obtained a score of *** suggesting {gbgad7severity:21753}.  Interventions:  {Interventions:22172}  DSM-5 Diagnosis: 296.33 (F33.2) Major Depressive Disorder, Recurrent Episode, Severe, With Anxious Distress, Severe  Treatment Goal & Progress: During the initial appointment with this provider, the following treatment goal was established: decrease emotional eating. Rebekah [name] has demonstrated progress in her goal as evidenced by ***  Plan: Rebekah [name] continues to appear able and willing to participate as evidenced by engagement in reciprocal conversation, and asking questions for clarification as appropriate.*** The next appointment will be scheduled in {gbweeks:21758}. The next session will focus on reviewing learned skills, and working towards the established treatment goal.***

## 2019-01-12 ENCOUNTER — Ambulatory Visit (INDEPENDENT_AMBULATORY_CARE_PROVIDER_SITE_OTHER): Payer: Self-pay | Admitting: Psychology

## 2019-01-12 ENCOUNTER — Encounter (INDEPENDENT_AMBULATORY_CARE_PROVIDER_SITE_OTHER): Payer: Self-pay

## 2019-01-12 ENCOUNTER — Ambulatory Visit (INDEPENDENT_AMBULATORY_CARE_PROVIDER_SITE_OTHER): Payer: Self-pay | Admitting: Family Medicine

## 2019-01-13 ENCOUNTER — Ambulatory Visit: Payer: 59 | Admitting: Neurology

## 2019-01-15 ENCOUNTER — Encounter (INDEPENDENT_AMBULATORY_CARE_PROVIDER_SITE_OTHER): Payer: Self-pay

## 2019-01-18 ENCOUNTER — Other Ambulatory Visit (INDEPENDENT_AMBULATORY_CARE_PROVIDER_SITE_OTHER): Payer: Self-pay | Admitting: Family Medicine

## 2019-01-18 DIAGNOSIS — R7303 Prediabetes: Secondary | ICD-10-CM

## 2019-01-22 ENCOUNTER — Ambulatory Visit (INDEPENDENT_AMBULATORY_CARE_PROVIDER_SITE_OTHER): Payer: 59 | Admitting: Family Medicine

## 2019-01-22 ENCOUNTER — Other Ambulatory Visit: Payer: Self-pay

## 2019-01-22 ENCOUNTER — Encounter (INDEPENDENT_AMBULATORY_CARE_PROVIDER_SITE_OTHER): Payer: Self-pay | Admitting: Family Medicine

## 2019-01-22 DIAGNOSIS — E669 Obesity, unspecified: Secondary | ICD-10-CM | POA: Diagnosis not present

## 2019-01-22 DIAGNOSIS — Z6830 Body mass index (BMI) 30.0-30.9, adult: Secondary | ICD-10-CM | POA: Diagnosis not present

## 2019-01-22 DIAGNOSIS — E559 Vitamin D deficiency, unspecified: Secondary | ICD-10-CM | POA: Diagnosis not present

## 2019-01-22 DIAGNOSIS — R7303 Prediabetes: Secondary | ICD-10-CM

## 2019-01-22 MED ORDER — VITAMIN D (ERGOCALCIFEROL) 1.25 MG (50000 UNIT) PO CAPS: 50000.0000 [IU] | ORAL_CAPSULE | ORAL | 0 refills | Status: DC

## 2019-01-22 MED ORDER — METFORMIN HCL 500 MG PO TABS: 500.0000 mg | ORAL_TABLET | Freq: Every day | ORAL | 0 refills | Status: DC

## 2019-01-25 NOTE — Progress Notes (Signed)
Office: [phone]  /  Fax: [phone] TeleHealth Visit:  [name] has verbally consented to this TeleHealth visit today. The patient is located at home, the provider is located at the [facility] office. The participants in this visit include the listed provider and patient and provider's assistant. The visit was conducted today via webex.  HPI:   Chief Complaint: OBESITY Rebekah [name] is here to discuss her progress with her obesity treatment plan. She is on the Category 2 plan and is following her eating plan approximately 0 % of the time. She states she is doing yard work. [name] has had a doo deal of stress the past few weeks with COVID-19 crisis. She has had to work at home (not new) but has not been following the plan. She has been eating more chocolate. She denies increase in hunger with yard work.  We were unable to weigh the patient today for this TeleHealth visit. She feels as if she has maintained her weight since her last visit. She has lost 19 lbs since starting treatment with [name].  Pre-Diabetes [name] has a diagnosis of pre-diabetes based on her elevated Hgb A1c and was informed this puts her at greater risk of developing diabetes. She denies GI side effects of metformin and notes improved hunger control. She continues to work on diet and exercise to decrease risk of diabetes. She denies hypoglycemia.  Vitamin D Deficiency [name] has a diagnosis of vitamin D deficiency. She is currently taking prescription Vit D. She notes fatigue and denies nausea, vomiting or muscle weakness.  ASSESSMENT AND PLAN:  Prediabetes - Plan: metFORMIN (GLUCOPHAGE) 500 MG tablet  Vitamin D deficiency - Plan: Vitamin D, Ergocalciferol, (DRISDOL) 1.25 MG (50000 UT) CAPS capsule  Class 1 obesity with serious comorbidity and body mass index (BMI) of 30.0 to 30.9 in adult, unspecified obesity type  PLAN:  Pre-Diabetes [name] will continue to work on weight loss, exercise, and decreasing  simple carbohydrates in her diet to help decrease the risk of diabetes. We dicussed metformin including benefits and risks. She was informed that eating too many simple carbohydrates or too many calories at one sitting increases the likelihood of GI side effects. [name] agrees to continue taking metformin 500 mg PO daily #90 with no refills. [name] agrees to follow up with our clinic in 2 weeks as directed to monitor her progress.  Vitamin D Deficiency Kennedi was informed that low vitamin D levels contributes to fatigue and are associated with obesity, breast, and colon cancer. Benita agrees to continue taking prescription Vit D @50 ,000 IU every week #4 and we will refill for 1 month. She will follow up for routine testing of vitamin D, at least 2-3 times per year. She was informed of the risk of over-replacement of vitamin D and agrees to not increase her dose unless she discusses this with [name] first. [name] agrees to follow up with our clinic in 2 weeks.  Obesity [name] is currently in the action stage of change. As such, her goal is to continue with weight loss efforts She has agreed to follow the Category 2 plan [name] has been instructed to work up to a goal of 150 minutes of combined cardio and strengthening exercise per week for weight loss and overall health benefits. We discussed the following Behavioral Modification Strategies today: increasing lean protein intake, increasing vegetables and work on meal planning and easy cooking plans, and planning for success   [name] has agreed to follow up with our  clinic in 2 weeks. She was informed of the importance of frequent follow up visits to maximize her success with intensive lifestyle modifications for her multiple health conditions.  ALLERGIES: Allergies  Allergen Reactions  . Penicillins Hives and Rash    Has patient had a PCN reaction causing immediate rash, facial/tongue/throat swelling, SOB or lightheadedness with hypotension: Yes Has patient  had a PCN reaction causing severe rash involving mucus membranes or skin necrosis: Yes Has patient had a PCN reaction that required hospitalization: No Has patient had a PCN reaction occurring within the last 10 years: No If all of the above answers are "NO", then may proceed with Cephalosporin use.   . Erythromycin Nausea Only    Gastrointestinal issues.    MEDICATIONS: Current Outpatient Medications on File Prior to Visit  Medication Sig Dispense Refill  . aspirin 81 MG tablet Take 1 tablet (81 mg total) by mouth daily. 90 tablet 3  . Azelaic Acid (FINACEA) 15 % FOAM Apply 1 application topically 2 (two) times daily as needed.     Marland Kitchen DIVIGEL 1 MG/GM GEL Take 1 mg by mouth daily.     Marland Kitchen L-Methylfolate-Algae (DEPLIN 7.5) 7.5-90.314 MG CAPS Take 1 capsule by mouth daily.     . Multiple Vitamin (MULTIVITAMIN) tablet Take 1 tablet by mouth daily. 90 tablet 1  . nortriptyline (PAMELOR) 75 MG capsule Take 1 capsule (75 mg total) by mouth daily. 90 capsule 3  . omeprazole (PRILOSEC) 40 MG capsule Take 1 capsule (40 mg total) by mouth daily. 90 capsule 3  . Probiotic Product (PROBIOTIC-10) CHEW Chew 1 tablet by mouth daily.     . progesterone (PROMETRIUM) 100 MG capsule Take 100 mg by mouth daily.    . ranitidine (ZANTAC) 150 MG tablet Take 150 mg by mouth 2 (two) times daily.    . rosuvastatin (CRESTOR) 10 MG tablet Take 1 tablet (10 mg total) by mouth at bedtime. 90 tablet 1   No current facility-administered medications on file prior to visit.     PAST MEDICAL HISTORY: Past Medical History:  Diagnosis Date  . Allergy   . Back pain   . Bowel obstruction ([place]) 2019  . Depression   . Gall bladder stones   . GERD (gastroesophageal reflux disease)   . Hyperlipidemia   . Hypertension   . IBS (irritable bowel syndrome)   . IBS (irritable bowel syndrome)   . Joint pain   . Obesity    Lap band surgery 12-08 Dr Hassell Done  . OSA (obstructive sleep apnea)    using a CPAP    . Pre-diabetes      PAST SURGICAL HISTORY: Past Surgical History:  Procedure Laterality Date  . APPENDECTOMY    . BREAST REDUCTION SURGERY    . CHOLECYSTECTOMY    . CLOSED REDUCTION FINGER WITH PERCUTANEOUS PINNING Left 09/14/2014   Procedure: CLOSED REDUCTION PERCUTANEOUS PINNING ;  Surgeon: Leanora Cover, MD;  Location: [facility];  Service: Orthopedics;  Laterality: Left;  . ESOPHAGEAL MANOMETRY N/A 09/23/2017   Procedure: ESOPHAGEAL MANOMETRY (EM);  Surgeon: Mauri Pole, MD;  Location: [facility];  Service: Endoscopy;  Laterality: N/A;  . Lap Band Adjustment  09/01/2015   Dr. Redmond Pulling  . LAPAROSCOPIC GASTRIC BANDING  09/2007   Lap band surgery 12-08 Dr Hassell Done  . NASAL SINUS SURGERY    . TONSILLECTOMY     x2    SOCIAL HISTORY: Social History   Tobacco Use  . Smoking status: Never Smoker  .  Smokeless tobacco: Never Used  Substance Use Topics  . Alcohol use: Yes    Comment: socially   . Drug use: No    FAMILY HISTORY: Family History  Problem Relation Age of Onset  . Coronary artery disease Father        CABG, smoker .age onset 32s  . Prostate cancer Father   . Pulmonary fibrosis Father        and others  . High blood pressure Father   . High Cholesterol Father   . Depression Father   . Obesity Father   . Breast cancer Mother        age 53  . Heart disease Mother        CABG, smoker .age onset 48s  . Diverticulitis Mother   . High blood pressure Mother   . High Cholesterol Mother   . Thyroid disease Mother   . Depression Mother   . Obesity Mother   . Heart attack Brother        age 46  . Diabetes Neg Hx   . Colon cancer Neg Hx   . Stomach cancer Neg Hx   . Esophageal cancer Neg Hx   . Rectal cancer Neg Hx     ROS: Review of Systems  Constitutional: Positive for malaise/fatigue. Negative for weight loss.  Gastrointestinal: Negative for nausea and vomiting.  Musculoskeletal:       Negative muscle weakness  Endo/Heme/Allergies:       Negative  hypoglycemia    PHYSICAL EXAM: Pt in no acute distress  RECENT LABS AND TESTS: BMET    Component Value Date/Time   NA 137 10/30/2018 1011   K 4.9 10/30/2018 1011   CL 97 10/30/2018 1011   CO2 21 10/30/2018 1011   GLUCOSE 86 10/30/2018 1011   GLUCOSE 74 03/04/2018 0852   GLUCOSE 95 08/24/2010 1048   BUN 14 10/30/2018 1011   CREATININE 1.04 (H) 10/30/2018 1011   CALCIUM 10.4 (H) 10/30/2018 1011   GFRNONAA 60 10/30/2018 1011   GFRAA 69 10/30/2018 1011   Lab Results  Component Value Date   HGBA1C 5.8 (H) 10/30/2018   HGBA1C 6.3 07/10/2018   HGBA1C 6.2 03/04/2018   HGBA1C 6.1 06/05/2017   HGBA1C 6.0 06/14/2016   Lab Results  Component Value Date   INSULIN 25.1 (H) 10/30/2018   INSULIN 38.3 (H) 07/21/2018   CBC    Component Value Date/Time   WBC 9.0 07/21/2018 1143   WBC 8.7 10/16/2017 1140   RBC 4.60 07/21/2018 1143   RBC 4.58 10/16/2017 1140   HGB 12.8 07/21/2018 1143   HCT 39.1 07/21/2018 1143   PLT 325.0 10/16/2017 1140   MCV 85 07/21/2018 1143   MCH 27.8 07/21/2018 1143   MCH 28.0 09/30/2017 0453   MCHC 32.7 07/21/2018 1143   MCHC 32.7 10/16/2017 1140   RDW 14.7 07/21/2018 1143   LYMPHSABS 3.3 (H) 07/21/2018 1143   MONOABS 0.5 10/16/2017 1140   EOSABS 0.2 07/21/2018 1143   BASOSABS 0.1 07/21/2018 1143   Iron/TIBC/Ferritin/ %Sat    Component Value Date/Time   IRON 86 01/07/2015 0902   FERRITIN 19.1 01/07/2015 0902   IRONPCTSAT 23.2 07/30/2008 0856   Lipid Panel     Component Value Date/Time   CHOL 160 10/30/2018 1011   TRIG 87 10/30/2018 1011   TRIG 99 08/30/2006 0904   HDL 63 10/30/2018 1011   CHOLHDL 2 07/10/2018 0819   VLDL 15.4 07/10/2018 0819   LDLCALC 80 10/30/2018 1011  Hepatic Function Panel     Component Value Date/Time   PROT 8.1 10/30/2018 1011   ALBUMIN 5.1 10/30/2018 1011   AST 24 10/30/2018 1011   ALT 20 10/30/2018 1011   ALKPHOS 80 10/30/2018 1011   BILITOT 0.3 10/30/2018 1011   BILIDIR 0.0 05/15/2012 1453       Component Value Date/Time   TSH 4.580 (H) 10/30/2018 1011   TSH 3.130 07/21/2018 1143   TSH 4.65 (H) 07/10/2018 0819      I, Trixie Dredge, am acting as transcriptionist for Ilene Qua, MD  I have reviewed the above documentation for accuracy and completeness, and I agree with the above. - Ilene Qua, MD

## 2019-01-27 ENCOUNTER — Telehealth (INDEPENDENT_AMBULATORY_CARE_PROVIDER_SITE_OTHER): Payer: Self-pay | Admitting: Psychology

## 2019-01-27 NOTE — Telephone Encounter (Signed)
  Office: [phone]  /  Fax: [phone]  Date of Call: January 27, 2019 Time of Call: 2:50pm Duration of Call: 6 minutes Provider: Glennie Isle, PsyD  CONTENT: This provider called Rebekah [name] to check-in and schedule an appointment, as the last appointment was canceled due to COVID-19. Rebekah [name] shared she is "feeling pretty good." She indicated she would be unable to resume appointments with this provider at this time due to her "high deductible." Rebekah [name] shared a plan to utilize EAP services in June when the benefit is available to her, and was receptive to still exploring her options for therapeutic services via hospice. A brief risk assessment was completed. Rebekah [name] denied current suicidal and homicidal ideation, plan, and intent, as well as since the last appointment with this provider.   PLAN: Per Rebekah [name]'s request, services with this provider have been terminated. She noted a plan to initiate therapeutic services in the future if needed via EAP or hospice.

## 2019-02-05 ENCOUNTER — Ambulatory Visit (INDEPENDENT_AMBULATORY_CARE_PROVIDER_SITE_OTHER): Payer: Self-pay | Admitting: Family Medicine

## 2019-02-12 ENCOUNTER — Other Ambulatory Visit (INDEPENDENT_AMBULATORY_CARE_PROVIDER_SITE_OTHER): Payer: Self-pay | Admitting: Family Medicine

## 2019-02-12 DIAGNOSIS — E559 Vitamin D deficiency, unspecified: Secondary | ICD-10-CM

## 2019-02-16 ENCOUNTER — Other Ambulatory Visit: Payer: Self-pay

## 2019-02-16 ENCOUNTER — Ambulatory Visit (INDEPENDENT_AMBULATORY_CARE_PROVIDER_SITE_OTHER): Payer: 59 | Admitting: Family Medicine

## 2019-02-16 ENCOUNTER — Encounter (INDEPENDENT_AMBULATORY_CARE_PROVIDER_SITE_OTHER): Payer: Self-pay | Admitting: Family Medicine

## 2019-02-16 DIAGNOSIS — E559 Vitamin D deficiency, unspecified: Secondary | ICD-10-CM | POA: Diagnosis not present

## 2019-02-16 DIAGNOSIS — E669 Obesity, unspecified: Secondary | ICD-10-CM | POA: Diagnosis not present

## 2019-02-16 DIAGNOSIS — Z6830 Body mass index (BMI) 30.0-30.9, adult: Secondary | ICD-10-CM | POA: Diagnosis not present

## 2019-02-16 DIAGNOSIS — R7303 Prediabetes: Secondary | ICD-10-CM | POA: Diagnosis not present

## 2019-02-17 NOTE — Progress Notes (Signed)
Office: [phone]  /  Fax: [phone] TeleHealth Visit:  Rebekah [name] has verbally consented to this TeleHealth visit today. The patient is located at home, the provider is located at the [facility] [employer] [facility] office. The participants in this visit include the listed provider and patient. The visit was conducted today via webex.  HPI:   Chief Complaint: OBESITY Rebekah [name] is here to discuss her progress with her obesity treatment plan. She is on the Category 2 plan and is following her eating plan approximately 20 % of the time. She states she is doing yard work. Rebekah [name] struggled to follow the meal plan in the past few weeks. She is eating more starches, snacks, and chocolate throughout the day. Her husband has been eating more snack indulgences.  We were unable to weigh the patient today for this TeleHealth visit. She feels as if she has gained 4 lbs since her last visit. She has lost 19 lbs since starting treatment with [name].  Vitamin D Deficiency Rebekah [name] has a diagnosis of vitamin D deficiency. She is currently taking prescription Vit D. She notes fatigue and denies nausea, vomiting or muscle weakness.  Pre-Diabetes Rebekah [name] has a diagnosis of pre-diabetes based on her elevated Hgb A1c of 5.8 and insulin of 25.1, last labs were early January. She was informed this puts her at greater risk of developing diabetes. She is taking metformin currently and continues to work on diet and exercise to decrease risk of diabetes.   ASSESSMENT AND PLAN:  Vitamin D deficiency - Plan: Vitamin D, Ergocalciferol, (DRISDOL) 1.25 MG (50000 UT) CAPS capsule  Prediabetes  Class 1 obesity with serious comorbidity and body mass index (BMI) of 30.0 to 30.9 in adult, unspecified obesity type  PLAN:  Vitamin D Deficiency Rebekah [name] was informed that low vitamin D levels contributes to fatigue and are associated with obesity, breast, and colon cancer. Rebekah [name] agrees to continue taking prescription Vit D @50 ,000 IU  every week #12 with no refills. She will follow up for routine testing of vitamin D, at least 2-3 times per year. She was informed of the risk of over-replacement of vitamin D and agrees to not increase her dose unless she discusses this with [name] first. Rebekah [name] agrees to follow up with our clinic in 2 weeks.  Pre-Diabetes Rebekah [name] will continue to work on weight loss, exercise, and decreasing simple carbohydrates in her diet to help decrease the risk of diabetes. We dicussed metformin including benefits and risks. She was informed that eating too many simple carbohydrates or too many calories at one sitting increases the likelihood of GI side effects. Rebekah [name] agrees to continue taking metformin, and we will repeat labs at next appointment. Rebekah [name] agrees to follow up with our clinic in 2 weeks as directed to monitor her progress.  Obesity Rebekah [name] is currently in the action stage of change. As such, her goal is to continue with weight loss efforts She has agreed to follow the Category 2 plan Rebekah [name] has been instructed to work up to a goal of 150 minutes of combined cardio and strengthening exercise per week for weight loss and overall health benefits. We discussed the following Behavioral Modification Strategies today: increasing lean protein intake, increasing vegetables and work on meal planning and easy cooking plans, and planning for success   Rebekah [name] has agreed to follow up with our clinic in 2 weeks. She was informed of the importance of frequent follow up visits to maximize her success with intensive lifestyle modifications for her multiple  health conditions.  ALLERGIES: Allergies  Allergen Reactions  . Penicillins Hives and Rash    Has patient had a PCN reaction causing immediate rash, facial/tongue/throat swelling, SOB or lightheadedness with hypotension: Yes Has patient had a PCN reaction causing severe rash involving mucus membranes or skin necrosis: Yes Has patient had a PCN reaction that required  hospitalization: No Has patient had a PCN reaction occurring within the last 10 years: No If all of the above answers are "NO", then may proceed with Cephalosporin use.   . Erythromycin Nausea Only    Gastrointestinal issues.    MEDICATIONS: Current Outpatient Medications on File Prior to Visit  Medication Sig Dispense Refill  . aspirin 81 MG tablet Take 1 tablet (81 mg total) by mouth daily. 90 tablet 3  . Azelaic Acid (FINACEA) 15 % FOAM Apply 1 application topically 2 (two) times daily as needed.     Marland Kitchen DIVIGEL 1 MG/GM GEL Take 1 mg by mouth daily.     Marland Kitchen L-Methylfolate-Algae (DEPLIN 7.5) 7.5-90.314 MG CAPS Take 1 capsule by mouth daily.     . metFORMIN (GLUCOPHAGE) 500 MG tablet Take 1 tablet (500 mg total) by mouth daily with breakfast. 90 tablet 0  . Multiple Vitamin (MULTIVITAMIN) tablet Take 1 tablet by mouth daily. 90 tablet 1  . nortriptyline (PAMELOR) 75 MG capsule Take 1 capsule (75 mg total) by mouth daily. 90 capsule 3  . omeprazole (PRILOSEC) 40 MG capsule Take 1 capsule (40 mg total) by mouth daily. 90 capsule 3  . Probiotic Product (PROBIOTIC-10) CHEW Chew 1 tablet by mouth daily.     . progesterone (PROMETRIUM) 100 MG capsule Take 100 mg by mouth daily.    . ranitidine (ZANTAC) 150 MG tablet Take 150 mg by mouth 2 (two) times daily.    . rosuvastatin (CRESTOR) 10 MG tablet Take 1 tablet (10 mg total) by mouth at bedtime. 90 tablet 1  . Vitamin D, Ergocalciferol, (DRISDOL) 1.25 MG (50000 UT) CAPS capsule Take 1 capsule (50,000 Units total) by mouth every 7 (seven) days. 4 capsule 0   No current facility-administered medications on file prior to visit.     PAST MEDICAL HISTORY: Past Medical History:  Diagnosis Date  . Allergy   . Back pain   . Bowel obstruction ([place]) 2019  . Depression   . Gall bladder stones   . GERD (gastroesophageal reflux disease)   . Hyperlipidemia   . Hypertension   . IBS (irritable bowel syndrome)   . IBS (irritable bowel syndrome)   .  Joint pain   . Obesity    Lap band surgery 12-08 Dr Hassell Done  . OSA (obstructive sleep apnea)    using a CPAP    . Pre-diabetes     PAST SURGICAL HISTORY: Past Surgical History:  Procedure Laterality Date  . APPENDECTOMY    . BREAST REDUCTION SURGERY    . CHOLECYSTECTOMY    . CLOSED REDUCTION FINGER WITH PERCUTANEOUS PINNING Left 09/14/2014   Procedure: CLOSED REDUCTION PERCUTANEOUS PINNING ;  Surgeon: Leanora Cover, MD;  Location: [facility];  Service: Orthopedics;  Laterality: Left;  . ESOPHAGEAL MANOMETRY N/A 09/23/2017   Procedure: ESOPHAGEAL MANOMETRY (EM);  Surgeon: Mauri Pole, MD;  Location: [facility];  Service: Endoscopy;  Laterality: N/A;  . Lap Band Adjustment  09/01/2015   Dr. Redmond Pulling  . LAPAROSCOPIC GASTRIC BANDING  09/2007   Lap band surgery 12-08 Dr Hassell Done  . NASAL SINUS SURGERY    . TONSILLECTOMY  x2    SOCIAL HISTORY: Social History   Tobacco Use  . Smoking status: Never Smoker  . Smokeless tobacco: Never Used  Substance Use Topics  . Alcohol use: Yes    Comment: socially   . Drug use: No    FAMILY HISTORY: Family History  Problem Relation Age of Onset  . Coronary artery disease Father        CABG, smoker .age onset 19s  . Prostate cancer Father   . Pulmonary fibrosis Father        and others  . High blood pressure Father   . High Cholesterol Father   . Depression Father   . Obesity Father   . Breast cancer Mother        age 27  . Heart disease Mother        CABG, smoker .age onset 63s  . Diverticulitis Mother   . High blood pressure Mother   . High Cholesterol Mother   . Thyroid disease Mother   . Depression Mother   . Obesity Mother   . Heart attack Brother        age 40  . Diabetes Neg Hx   . Colon cancer Neg Hx   . Stomach cancer Neg Hx   . Esophageal cancer Neg Hx   . Rectal cancer Neg Hx     ROS: Review of Systems  Constitutional: Positive for malaise/fatigue. Negative for weight loss.   Gastrointestinal: Negative for nausea and vomiting.  Musculoskeletal:       Negative muscle weakness    PHYSICAL EXAM: Pt in no acute distress  RECENT LABS AND TESTS: BMET    Component Value Date/Time   NA 137 10/30/2018 1011   K 4.9 10/30/2018 1011   CL 97 10/30/2018 1011   CO2 21 10/30/2018 1011   GLUCOSE 86 10/30/2018 1011   GLUCOSE 74 03/04/2018 0852   GLUCOSE 95 08/24/2010 1048   BUN 14 10/30/2018 1011   CREATININE 1.04 (H) 10/30/2018 1011   CALCIUM 10.4 (H) 10/30/2018 1011   GFRNONAA 60 10/30/2018 1011   GFRAA 69 10/30/2018 1011   Lab Results  Component Value Date   HGBA1C 5.8 (H) 10/30/2018   HGBA1C 6.3 07/10/2018   HGBA1C 6.2 03/04/2018   HGBA1C 6.1 06/05/2017   HGBA1C 6.0 06/14/2016   Lab Results  Component Value Date   INSULIN 25.1 (H) 10/30/2018   INSULIN 38.3 (H) 07/21/2018   CBC    Component Value Date/Time   WBC 9.0 07/21/2018 1143   WBC 8.7 10/16/2017 1140   RBC 4.60 07/21/2018 1143   RBC 4.58 10/16/2017 1140   HGB 12.8 07/21/2018 1143   HCT 39.1 07/21/2018 1143   PLT 325.0 10/16/2017 1140   MCV 85 07/21/2018 1143   MCH 27.8 07/21/2018 1143   MCH 28.0 09/30/2017 0453   MCHC 32.7 07/21/2018 1143   MCHC 32.7 10/16/2017 1140   RDW 14.7 07/21/2018 1143   LYMPHSABS 3.3 (H) 07/21/2018 1143   MONOABS 0.5 10/16/2017 1140   EOSABS 0.2 07/21/2018 1143   BASOSABS 0.1 07/21/2018 1143   Iron/TIBC/Ferritin/ %Sat    Component Value Date/Time   IRON 86 01/07/2015 0902   FERRITIN 19.1 01/07/2015 0902   IRONPCTSAT 23.2 07/30/2008 0856   Lipid Panel     Component Value Date/Time   CHOL 160 10/30/2018 1011   TRIG 87 10/30/2018 1011   TRIG 99 08/30/2006 0904   HDL 63 10/30/2018 1011   CHOLHDL 2 07/10/2018 0819   VLDL  15.4 07/10/2018 0819   LDLCALC 80 10/30/2018 1011   Hepatic Function Panel     Component Value Date/Time   PROT 8.1 10/30/2018 1011   ALBUMIN 5.1 10/30/2018 1011   AST 24 10/30/2018 1011   ALT 20 10/30/2018 1011   ALKPHOS 80  10/30/2018 1011   BILITOT 0.3 10/30/2018 1011   BILIDIR 0.0 05/15/2012 1453      Component Value Date/Time   TSH 4.580 (H) 10/30/2018 1011   TSH 3.130 07/21/2018 1143   TSH 4.65 (H) 07/10/2018 0819      I, Trixie Dredge, am acting as transcriptionist for Ilene Qua, MD  I have reviewed the above documentation for accuracy and completeness, and I agree with the above. - Ilene Qua, MD

## 2019-02-19 MED ORDER — VITAMIN D (ERGOCALCIFEROL) 1.25 MG (50000 UNIT) PO CAPS: 50000.0000 [IU] | ORAL_CAPSULE | ORAL | 0 refills | Status: DC

## 2019-03-02 ENCOUNTER — Ambulatory Visit (INDEPENDENT_AMBULATORY_CARE_PROVIDER_SITE_OTHER): Payer: Self-pay | Admitting: Family Medicine

## 2019-03-11 ENCOUNTER — Ambulatory Visit (INDEPENDENT_AMBULATORY_CARE_PROVIDER_SITE_OTHER): Payer: 59 | Admitting: Family Medicine

## 2019-03-12 ENCOUNTER — Ambulatory Visit (INDEPENDENT_AMBULATORY_CARE_PROVIDER_SITE_OTHER): Payer: 59 | Admitting: Family Medicine

## 2019-03-12 ENCOUNTER — Encounter (INDEPENDENT_AMBULATORY_CARE_PROVIDER_SITE_OTHER): Payer: Self-pay | Admitting: Family Medicine

## 2019-03-12 ENCOUNTER — Other Ambulatory Visit: Payer: Self-pay

## 2019-03-12 DIAGNOSIS — R7303 Prediabetes: Secondary | ICD-10-CM

## 2019-03-12 DIAGNOSIS — Z6830 Body mass index (BMI) 30.0-30.9, adult: Secondary | ICD-10-CM | POA: Diagnosis not present

## 2019-03-12 DIAGNOSIS — E559 Vitamin D deficiency, unspecified: Secondary | ICD-10-CM

## 2019-03-12 DIAGNOSIS — E669 Obesity, unspecified: Secondary | ICD-10-CM

## 2019-03-16 NOTE — Progress Notes (Signed)
Office: [phone]  /  Fax: [phone] TeleHealth Visit:  CHE BELOW has verbally consented to this TeleHealth visit today. The patient is located at home, the provider is located at the [facility] office. The participants in this visit include the listed provider and patient. The visit was conducted today via webex.  HPI:   Chief Complaint: OBESITY Rebekah [name] is here to discuss her progress with her obesity treatment plan. She is on the Category 2 plan and is following her eating plan approximately 25 % of the time. She states she is doing Haematologist. Rebekah [name] had a difficult time following the plan for the past two weeks. She is struggling to get motivated again. She has tried to get motivated again this week. She is trying to pre-plan food for a few days so she can grab it easily.  We were unable to weigh the patient today for this TeleHealth visit. She feels as if she has gained 1.5 lbs since her last visit. She has lost 19 lbs since starting treatment with [name].  Vitamin D Deficiency Rebekah [name] has a diagnosis of vitamin D deficiency. She is currently taking prescription Vit D. She notes fatigue and denies nausea, vomiting or muscle weakness.  Pre-Diabetes Rebekah [name] has a diagnosis of pre-diabetes based on her elevated Hgb A1c at last labs of 5.8 in early January. She was informed this puts her at greater risk of developing diabetes. She denies GI side effects of metformin and continues to work on diet and exercise to decrease risk of diabetes. She denies hypoglycemia.  ASSESSMENT AND PLAN:  Vitamin D deficiency  Prediabetes  Class 1 obesity with serious comorbidity and body mass index (BMI) of 30.0 to 30.9 in adult, unspecified obesity type  PLAN:  Vitamin D Deficiency Rebekah [name] was informed that low vitamin D levels contributes to fatigue and are associated with obesity, breast, and colon cancer. Audrielle agrees to continue taking prescription Vit D @50 ,000 IU every week, no refill  needed. She will follow up for routine testing of vitamin D, at least 2-3 times per year. She was informed of the risk of over-replacement of vitamin D and agrees to not increase her dose unless she discusses this with [name] first. Reed agrees to follow up with our clinic in 2 weeks.  Pre-Diabetes Rebekah [name] will continue to work on weight loss, exercise, and decreasing simple carbohydrates in her diet to help decrease the risk of diabetes. We dicussed metformin including benefits and risks. She was informed that eating too many simple carbohydrates or too many calories at one sitting increases the likelihood of GI side effects. Jozalynn agrees to continue taking metformin, no refill needed. Rebekah [name] agrees to follow up with our clinic in 2 weeks as directed to monitor her progress.  Obesity Rebekah [name] is currently in the action stage of change. As such, her goal is to continue with weight loss efforts She has agreed to follow the Category 2 plan Rebekah [name] has been instructed to work up to a goal of 150 minutes of combined cardio and strengthening exercise per week for weight loss and overall health benefits. We discussed the following Behavioral Modification Strategies today: increasing lean protein intake, increasing vegetables and work on meal planning and easy cooking plans, keeping healthy foods in the home, and planning for success   Rebekah [name] has agreed to follow up with our clinic in 2 weeks. She was informed of the importance of frequent follow up visits to maximize her success with intensive lifestyle modifications for  her multiple health conditions.  ALLERGIES: Allergies  Allergen Reactions  . Penicillins Hives and Rash    Has patient had a PCN reaction causing immediate rash, facial/tongue/throat swelling, SOB or lightheadedness with hypotension: Yes Has patient had a PCN reaction causing severe rash involving mucus membranes or skin necrosis: Yes Has patient had a PCN reaction that required hospitalization:  No Has patient had a PCN reaction occurring within the last 10 years: No If all of the above answers are "NO", then may proceed with Cephalosporin use.   . Erythromycin Nausea Only    Gastrointestinal issues.    MEDICATIONS: Current Outpatient Medications on File Prior to Visit  Medication Sig Dispense Refill  . aspirin 81 MG tablet Take 1 tablet (81 mg total) by mouth daily. 90 tablet 3  . Azelaic Acid (FINACEA) 15 % FOAM Apply 1 application topically 2 (two) times daily as needed.     Marland Kitchen DIVIGEL 1 MG/GM GEL Take 1 mg by mouth daily.     Marland Kitchen L-Methylfolate-Algae (DEPLIN 7.5) 7.5-90.314 MG CAPS Take 1 capsule by mouth daily.     . metFORMIN (GLUCOPHAGE) 500 MG tablet Take 1 tablet (500 mg total) by mouth daily with breakfast. 90 tablet 0  . Multiple Vitamin (MULTIVITAMIN) tablet Take 1 tablet by mouth daily. 90 tablet 1  . nortriptyline (PAMELOR) 75 MG capsule Take 1 capsule (75 mg total) by mouth daily. 90 capsule 3  . omeprazole (PRILOSEC) 40 MG capsule Take 1 capsule (40 mg total) by mouth daily. 90 capsule 3  . Probiotic Product (PROBIOTIC-10) CHEW Chew 1 tablet by mouth daily.     . progesterone (PROMETRIUM) 100 MG capsule Take 100 mg by mouth daily.    . ranitidine (ZANTAC) 150 MG tablet Take 150 mg by mouth 2 (two) times daily.    . rosuvastatin (CRESTOR) 10 MG tablet Take 1 tablet (10 mg total) by mouth at bedtime. 90 tablet 1  . Vitamin D, Ergocalciferol, (DRISDOL) 1.25 MG (50000 UT) CAPS capsule Take 1 capsule (50,000 Units total) by mouth every 7 (seven) days. 12 capsule 0   No current facility-administered medications on file prior to visit.     PAST MEDICAL HISTORY: Past Medical History:  Diagnosis Date  . Allergy   . Back pain   . Bowel obstruction ([place]) 2019  . Depression   . Gall bladder stones   . GERD (gastroesophageal reflux disease)   . Hyperlipidemia   . Hypertension   . IBS (irritable bowel syndrome)   . IBS (irritable bowel syndrome)   . Joint pain   .  Obesity    Lap band surgery 12-08 Dr Hassell Done  . OSA (obstructive sleep apnea)    using a CPAP    . Pre-diabetes     PAST SURGICAL HISTORY: Past Surgical History:  Procedure Laterality Date  . APPENDECTOMY    . BREAST REDUCTION SURGERY    . CHOLECYSTECTOMY    . CLOSED REDUCTION FINGER WITH PERCUTANEOUS PINNING Left 09/14/2014   Procedure: CLOSED REDUCTION PERCUTANEOUS PINNING ;  Surgeon: Leanora Cover, MD;  Location: [facility];  Service: Orthopedics;  Laterality: Left;  . ESOPHAGEAL MANOMETRY N/A 09/23/2017   Procedure: ESOPHAGEAL MANOMETRY (EM);  Surgeon: Mauri Pole, MD;  Location: [facility];  Service: Endoscopy;  Laterality: N/A;  . Lap Band Adjustment  09/01/2015   Dr. Redmond Pulling  . LAPAROSCOPIC GASTRIC BANDING  09/2007   Lap band surgery 12-08 Dr Hassell Done  . NASAL SINUS SURGERY    .  TONSILLECTOMY     x2    SOCIAL HISTORY: Social History   Tobacco Use  . Smoking status: Never Smoker  . Smokeless tobacco: Never Used  Substance Use Topics  . Alcohol use: Yes    Comment: socially   . Drug use: No    FAMILY HISTORY: Family History  Problem Relation Age of Onset  . Coronary artery disease Father        CABG, smoker .age onset 30s  . Prostate cancer Father   . Pulmonary fibrosis Father        and others  . High blood pressure Father   . High Cholesterol Father   . Depression Father   . Obesity Father   . Breast cancer Mother        age 55  . Heart disease Mother        CABG, smoker .age onset 2s  . Diverticulitis Mother   . High blood pressure Mother   . High Cholesterol Mother   . Thyroid disease Mother   . Depression Mother   . Obesity Mother   . Heart attack Brother        age 24  . Diabetes Neg Hx   . Colon cancer Neg Hx   . Stomach cancer Neg Hx   . Esophageal cancer Neg Hx   . Rectal cancer Neg Hx     ROS: Review of Systems  Constitutional: Positive for malaise/fatigue. Negative for weight loss.  Gastrointestinal: Negative  for nausea and vomiting.  Musculoskeletal:       Negative muscle weakness  Endo/Heme/Allergies:       Negative hypoglycemia    PHYSICAL EXAM: Pt in no acute distress  RECENT LABS AND TESTS: BMET    Component Value Date/Time   NA 137 10/30/2018 1011   K 4.9 10/30/2018 1011   CL 97 10/30/2018 1011   CO2 21 10/30/2018 1011   GLUCOSE 86 10/30/2018 1011   GLUCOSE 74 03/04/2018 0852   GLUCOSE 95 08/24/2010 1048   BUN 14 10/30/2018 1011   CREATININE 1.04 (H) 10/30/2018 1011   CALCIUM 10.4 (H) 10/30/2018 1011   GFRNONAA 60 10/30/2018 1011   GFRAA 69 10/30/2018 1011   Lab Results  Component Value Date   HGBA1C 5.8 (H) 10/30/2018   HGBA1C 6.3 07/10/2018   HGBA1C 6.2 03/04/2018   HGBA1C 6.1 06/05/2017   HGBA1C 6.0 06/14/2016   Lab Results  Component Value Date   INSULIN 25.1 (H) 10/30/2018   INSULIN 38.3 (H) 07/21/2018   CBC    Component Value Date/Time   WBC 9.0 07/21/2018 1143   WBC 8.7 10/16/2017 1140   RBC 4.60 07/21/2018 1143   RBC 4.58 10/16/2017 1140   HGB 12.8 07/21/2018 1143   HCT 39.1 07/21/2018 1143   PLT 325.0 10/16/2017 1140   MCV 85 07/21/2018 1143   MCH 27.8 07/21/2018 1143   MCH 28.0 09/30/2017 0453   MCHC 32.7 07/21/2018 1143   MCHC 32.7 10/16/2017 1140   RDW 14.7 07/21/2018 1143   LYMPHSABS 3.3 (H) 07/21/2018 1143   MONOABS 0.5 10/16/2017 1140   EOSABS 0.2 07/21/2018 1143   BASOSABS 0.1 07/21/2018 1143   Iron/TIBC/Ferritin/ %Sat    Component Value Date/Time   IRON 86 01/07/2015 0902   FERRITIN 19.1 01/07/2015 0902   IRONPCTSAT 23.2 07/30/2008 0856   Lipid Panel     Component Value Date/Time   CHOL 160 10/30/2018 1011   TRIG 87 10/30/2018 1011   TRIG 99 08/30/2006 0904  HDL 63 10/30/2018 1011   CHOLHDL 2 07/10/2018 0819   VLDL 15.4 07/10/2018 0819   LDLCALC 80 10/30/2018 1011   Hepatic Function Panel     Component Value Date/Time   PROT 8.1 10/30/2018 1011   ALBUMIN 5.1 10/30/2018 1011   AST 24 10/30/2018 1011   ALT 20  10/30/2018 1011   ALKPHOS 80 10/30/2018 1011   BILITOT 0.3 10/30/2018 1011   BILIDIR 0.0 05/15/2012 1453      Component Value Date/Time   TSH 4.580 (H) 10/30/2018 1011   TSH 3.130 07/21/2018 1143   TSH 4.65 (H) 07/10/2018 0819      I, Trixie Dredge, am acting as transcriptionist for Ilene Qua, MD  I have reviewed the above documentation for accuracy and completeness, and I agree with the above. - Ilene Qua, MD

## 2019-03-26 ENCOUNTER — Ambulatory Visit (INDEPENDENT_AMBULATORY_CARE_PROVIDER_SITE_OTHER): Payer: 59 | Admitting: Family Medicine

## 2019-03-26 ENCOUNTER — Other Ambulatory Visit: Payer: Self-pay

## 2019-03-26 DIAGNOSIS — E669 Obesity, unspecified: Secondary | ICD-10-CM

## 2019-03-26 DIAGNOSIS — E559 Vitamin D deficiency, unspecified: Secondary | ICD-10-CM | POA: Diagnosis not present

## 2019-03-26 DIAGNOSIS — Z6830 Body mass index (BMI) 30.0-30.9, adult: Secondary | ICD-10-CM | POA: Diagnosis not present

## 2019-03-26 DIAGNOSIS — R7303 Prediabetes: Secondary | ICD-10-CM

## 2019-03-26 NOTE — Progress Notes (Signed)
Office: [phone]  /  Fax: [phone] TeleHealth Visit:  Rebekah [name] has verbally consented to this TeleHealth visit today. The patient is located at home, the provider is located at the [facility] office. The participants in this visit include the listed provider and patient. The visit was conducted today via webex.  HPI:   Chief Complaint: OBESITY Rebekah [name] is here to discuss her progress with her obesity treatment plan. She is on the Category 2 plan and is following her eating plan approximately 70 % of the time. She states she is doing a lot of yard work. Rebekah [name]'s weight is of 184 lbs today. She is planning to vacation next week at [place]. She does cooking at the vacation house and is bringing groceries with her. She is planning on having some alcoholic beverages.  We were unable to weigh the patient today for this TeleHealth visit. She feels as if she has lost 4 lbs since her last visit. She has lost 19-23 lbs since starting treatment with [name].  Pre-Diabetes Rebekah [name] has a diagnosis of pre-diabetes based on her elevated Hgb A1c of 5.8 and insulin of 25.1 on 10/30/2018. She was informed this puts her at greater risk of developing diabetes. She is taking metformin currently and continues to work on diet and exercise to decrease risk of diabetes. She denies nausea or hypoglycemia.  Vitamin D Deficiency Rebekah [name] has a diagnosis of vitamin D deficiency. She is currently taking prescription Vit D. She notes fatigue and denies nausea, vomiting or muscle weakness.  ASSESSMENT AND PLAN:  Prediabetes  Vitamin D deficiency  Class 1 obesity with serious comorbidity and body mass index (BMI) of 30.0 to 30.9 in adult, unspecified obesity type  PLAN:  Pre-Diabetes Rebekah [name] will continue to work on weight loss, exercise, and decreasing simple carbohydrates in her diet to help decrease the risk of diabetes. We dicussed metformin including benefits and risks. She was informed that  eating too many simple carbohydrates or too many calories at one sitting increases the likelihood of GI side effects. Rebekah [name] agrees to continue taking metformin, and she agrees to follow up with our clinic in 3 weeks as directed to monitor her progress.  Vitamin D Deficiency Rebekah [name] was informed that low vitamin D levels contributes to fatigue and are associated with obesity, breast, and colon cancer. Rebekah [name] agrees to continue taking prescription Vit D @50 ,000 IU every week, no refill needed. She will follow up for routine testing of vitamin D, at least 2-3 times per year. She was informed of the risk of over-replacement of vitamin D and agrees to not increase her dose unless she discusses this with [name] first. Rebekah [name] agrees to follow up with our clinic in 3 weeks.  Obesity Rebekah [name] is currently in the action stage of change. As such, her goal is to continue with weight loss efforts She has agreed to follow the Category 2 plan Rebekah [name] has been instructed to work up to a goal of 150 minutes of combined cardio and strengthening exercise per week or plan to get in a habit of walking [place] the beach for weight loss and overall health benefits. We discussed the following Behavioral Modification Strategies today: increasing lean protein intake, increasing vegetables and work on meal planning and easy cooking plans, keeping healthy foods in the home, better snacking choices, and planning for success   Rebekah [name] has agreed to follow up with our clinic in 3 weeks. She was informed of the importance of frequent follow up  visits to maximize her success with intensive lifestyle modifications for her multiple health conditions.  ALLERGIES: Allergies  Allergen Reactions  . Penicillins Hives and Rash    Has patient had a PCN reaction causing immediate rash, facial/tongue/throat swelling, SOB or lightheadedness with hypotension: Yes Has patient had a PCN reaction causing severe rash involving mucus membranes or skin necrosis: Yes  Has patient had a PCN reaction that required hospitalization: No Has patient had a PCN reaction occurring within the last 10 years: No If all of the above answers are "NO", then may proceed with Cephalosporin use.   . Erythromycin Nausea Only    Gastrointestinal issues.    MEDICATIONS: Current Outpatient Medications on File Prior to Visit  Medication Sig Dispense Refill  . estradiol (CLIMARA - DOSED IN MG/24 HR) 0.025 mg/24hr patch Place 0.025 mg onto the skin once a week.    Marland Kitchen aspirin 81 MG tablet Take 1 tablet (81 mg total) by mouth daily. 90 tablet 3  . Azelaic Acid (FINACEA) 15 % FOAM Apply 1 application topically 2 (two) times daily as needed.     Marland Kitchen L-Methylfolate-Algae (DEPLIN 7.5) 7.5-90.314 MG CAPS Take 1 capsule by mouth daily.     . metFORMIN (GLUCOPHAGE) 500 MG tablet Take 1 tablet (500 mg total) by mouth daily with breakfast. 90 tablet 0  . Multiple Vitamin (MULTIVITAMIN) tablet Take 1 tablet by mouth daily. 90 tablet 1  . nortriptyline (PAMELOR) 75 MG capsule Take 1 capsule (75 mg total) by mouth daily. 90 capsule 3  . omeprazole (PRILOSEC) 40 MG capsule Take 1 capsule (40 mg total) by mouth daily. 90 capsule 3  . Probiotic Product (PROBIOTIC-10) CHEW Chew 1 tablet by mouth daily.     . progesterone (PROMETRIUM) 100 MG capsule Take 100 mg by mouth daily.    . ranitidine (ZANTAC) 150 MG tablet Take 150 mg by mouth 2 (two) times daily.    . rosuvastatin (CRESTOR) 10 MG tablet Take 1 tablet (10 mg total) by mouth at bedtime. 90 tablet 1  . Vitamin D, Ergocalciferol, (DRISDOL) 1.25 MG (50000 UT) CAPS capsule Take 1 capsule (50,000 Units total) by mouth every 7 (seven) days. 12 capsule 0   No current facility-administered medications on file prior to visit.     PAST MEDICAL HISTORY: Past Medical History:  Diagnosis Date  . Allergy   . Back pain   . Bowel obstruction ([place]) 2019  . Depression   . Gall bladder stones   . GERD (gastroesophageal reflux disease)   .  Hyperlipidemia   . Hypertension   . IBS (irritable bowel syndrome)   . IBS (irritable bowel syndrome)   . Joint pain   . Obesity    Lap band surgery 12-08 Dr Hassell Done  . OSA (obstructive sleep apnea)    using a CPAP    . Pre-diabetes     PAST SURGICAL HISTORY: Past Surgical History:  Procedure Laterality Date  . APPENDECTOMY    . BREAST REDUCTION SURGERY    . CHOLECYSTECTOMY    . CLOSED REDUCTION FINGER WITH PERCUTANEOUS PINNING Left 09/14/2014   Procedure: CLOSED REDUCTION PERCUTANEOUS PINNING ;  Surgeon: Leanora Cover, MD;  Location: [facility];  Service: Orthopedics;  Laterality: Left;  . ESOPHAGEAL MANOMETRY N/A 09/23/2017   Procedure: ESOPHAGEAL MANOMETRY (EM);  Surgeon: Mauri Pole, MD;  Location: [facility];  Service: Endoscopy;  Laterality: N/A;  . Lap Band Adjustment  09/01/2015   Dr. Redmond Pulling  . LAPAROSCOPIC GASTRIC BANDING  09/2007   Lap band surgery 12-08 Dr Hassell Done  . NASAL SINUS SURGERY    . TONSILLECTOMY     x2    SOCIAL HISTORY: Social History   Tobacco Use  . Smoking status: Never Smoker  . Smokeless tobacco: Never Used  Substance Use Topics  . Alcohol use: Yes    Comment: socially   . Drug use: No    FAMILY HISTORY: Family History  Problem Relation Age of Onset  . Coronary artery disease Father        CABG, smoker .age onset 47s  . Prostate cancer Father   . Pulmonary fibrosis Father        and others  . High blood pressure Father   . High Cholesterol Father   . Depression Father   . Obesity Father   . Breast cancer Mother        age 45  . Heart disease Mother        CABG, smoker .age onset 59s  . Diverticulitis Mother   . High blood pressure Mother   . High Cholesterol Mother   . Thyroid disease Mother   . Depression Mother   . Obesity Mother   . Heart attack Brother        age 33  . Diabetes Neg Hx   . Colon cancer Neg Hx   . Stomach cancer Neg Hx   . Esophageal cancer Neg Hx   . Rectal cancer Neg Hx      ROS: Review of Systems  Constitutional: Positive for malaise/fatigue and weight loss.  Gastrointestinal: Negative for nausea and vomiting.  Musculoskeletal:       Negative muscle weakness  Endo/Heme/Allergies:       Negative hypoglycemia    PHYSICAL EXAM: Pt in no acute distress  RECENT LABS AND TESTS: BMET    Component Value Date/Time   NA 137 10/30/2018 1011   K 4.9 10/30/2018 1011   CL 97 10/30/2018 1011   CO2 21 10/30/2018 1011   GLUCOSE 86 10/30/2018 1011   GLUCOSE 74 03/04/2018 0852   GLUCOSE 95 08/24/2010 1048   BUN 14 10/30/2018 1011   CREATININE 1.04 (H) 10/30/2018 1011   CALCIUM 10.4 (H) 10/30/2018 1011   GFRNONAA 60 10/30/2018 1011   GFRAA 69 10/30/2018 1011   Lab Results  Component Value Date   HGBA1C 5.8 (H) 10/30/2018   HGBA1C 6.3 07/10/2018   HGBA1C 6.2 03/04/2018   HGBA1C 6.1 06/05/2017   HGBA1C 6.0 06/14/2016   Lab Results  Component Value Date   INSULIN 25.1 (H) 10/30/2018   INSULIN 38.3 (H) 07/21/2018   CBC    Component Value Date/Time   WBC 9.0 07/21/2018 1143   WBC 8.7 10/16/2017 1140   RBC 4.60 07/21/2018 1143   RBC 4.58 10/16/2017 1140   HGB 12.8 07/21/2018 1143   HCT 39.1 07/21/2018 1143   PLT 325.0 10/16/2017 1140   MCV 85 07/21/2018 1143   MCH 27.8 07/21/2018 1143   MCH 28.0 09/30/2017 0453   MCHC 32.7 07/21/2018 1143   MCHC 32.7 10/16/2017 1140   RDW 14.7 07/21/2018 1143   LYMPHSABS 3.3 (H) 07/21/2018 1143   MONOABS 0.5 10/16/2017 1140   EOSABS 0.2 07/21/2018 1143   BASOSABS 0.1 07/21/2018 1143   Iron/TIBC/Ferritin/ %Sat    Component Value Date/Time   IRON 86 01/07/2015 0902   FERRITIN 19.1 01/07/2015 0902   IRONPCTSAT 23.2 07/30/2008 0856   Lipid Panel     Component Value Date/Time  CHOL 160 10/30/2018 1011   TRIG 87 10/30/2018 1011   TRIG 99 08/30/2006 0904   HDL 63 10/30/2018 1011   CHOLHDL 2 07/10/2018 0819   VLDL 15.4 07/10/2018 0819   LDLCALC 80 10/30/2018 1011   Hepatic Function Panel     Component  Value Date/Time   PROT 8.1 10/30/2018 1011   ALBUMIN 5.1 10/30/2018 1011   AST 24 10/30/2018 1011   ALT 20 10/30/2018 1011   ALKPHOS 80 10/30/2018 1011   BILITOT 0.3 10/30/2018 1011   BILIDIR 0.0 05/15/2012 1453      Component Value Date/Time   TSH 4.580 (H) 10/30/2018 1011   TSH 3.130 07/21/2018 1143   TSH 4.65 (H) 07/10/2018 0819      I, Trixie Dredge, am acting as transcriptionist for Ilene Qua, MD   I have reviewed the above documentation for accuracy and completeness, and I agree with the above. - Ilene Qua, MD

## 2019-04-02 ENCOUNTER — Other Ambulatory Visit: Payer: Self-pay | Admitting: Internal Medicine

## 2019-04-13 ENCOUNTER — Other Ambulatory Visit (INDEPENDENT_AMBULATORY_CARE_PROVIDER_SITE_OTHER): Payer: Self-pay | Admitting: Family Medicine

## 2019-04-13 DIAGNOSIS — R7303 Prediabetes: Secondary | ICD-10-CM

## 2019-04-14 ENCOUNTER — Ambulatory Visit (INDEPENDENT_AMBULATORY_CARE_PROVIDER_SITE_OTHER): Payer: 59 | Admitting: Family Medicine

## 2019-04-14 ENCOUNTER — Encounter (INDEPENDENT_AMBULATORY_CARE_PROVIDER_SITE_OTHER): Payer: Self-pay | Admitting: Family Medicine

## 2019-04-14 ENCOUNTER — Other Ambulatory Visit: Payer: Self-pay

## 2019-04-14 VITALS — BP 115/77 | HR 86 | Temp 98.1°F | Ht 64.0 in | Wt 183.0 lb

## 2019-04-14 DIAGNOSIS — Z9189 Other specified personal risk factors, not elsewhere classified: Secondary | ICD-10-CM | POA: Diagnosis not present

## 2019-04-14 DIAGNOSIS — Z6831 Body mass index (BMI) 31.0-31.9, adult: Secondary | ICD-10-CM | POA: Diagnosis not present

## 2019-04-14 DIAGNOSIS — E669 Obesity, unspecified: Secondary | ICD-10-CM | POA: Diagnosis not present

## 2019-04-14 DIAGNOSIS — R7303 Prediabetes: Secondary | ICD-10-CM | POA: Diagnosis not present

## 2019-04-14 DIAGNOSIS — E559 Vitamin D deficiency, unspecified: Secondary | ICD-10-CM | POA: Diagnosis not present

## 2019-04-14 NOTE — Progress Notes (Signed)
Office: [phone]  /  Fax: [phone]   HPI:   Chief Complaint: OBESITY Rebekah [name] is here to discuss her progress with her obesity treatment plan. She is on the Category 2 plan and is following her eating plan approximately 20 % of the time. She states she is exercising 0 minutes 0 times per week. Rebekah [name] went away to [place] for 1 week and she states this was not a relaxing trip. She has struggled with increase in emotions the past week. She voices that she needs to prep food in advance to be successful.  Her weight is 183 lb (83 kg) today and has gained 4 lbs since her last visit. She has lost 15 lbs since starting treatment with [name].  Vitamin D Deficiency Rebekah [name] has a diagnosis of vitamin D deficiency. She is currently taking prescription Vit D. She notes fatigue and denies nausea, vomiting or muscle weakness.  Pre-Diabetes Rebekah [name] has a diagnosis of pre-diabetes based on her elevated Hgb A1c of 5.8 and insulin of 25.1. She was informed this puts her at greater risk of developing diabetes. She is taking metformin currently and denies GI side effects. She continues to work on diet and exercise to decrease risk of diabetes. She denies hypoglycemia.  At risk for diabetes Rebekah [name] is at higher than average risk for developing diabetes due to her obesity and pre-diabetes. She currently denies polyuria or polydipsia.  ASSESSMENT AND PLAN:  Vitamin D deficiency - Plan: Vitamin D, Ergocalciferol, (DRISDOL) 1.25 MG (50000 UT) CAPS capsule  Prediabetes - Plan: metFORMIN (GLUCOPHAGE) 500 MG tablet  At risk for diabetes mellitus  Class 1 obesity with serious comorbidity and body mass index (BMI) of 31.0 to 31.9 in adult, unspecified obesity type  PLAN:  Vitamin D Deficiency Rebekah [name] was informed that low vitamin D levels contributes to fatigue and are associated with obesity, breast, and colon cancer. Rebekah [name] agrees to continue taking prescription Vit D 50,000 IU every week #12, 90 days supply  with no refills. She will follow up for routine testing of vitamin D, at least 2-3 times per year. She was informed of the risk of over-replacement of vitamin D and agrees to not increase her dose unless she discusses this with [name] first. Rebekah [name] agrees to follow up with our clinic in 2 weeks.  Pre-Diabetes Rebekah [name] will continue to work on weight loss, exercise, and decreasing simple carbohydrates in her diet to help decrease the risk of diabetes. We dicussed metformin including benefits and risks. She was informed that eating too many simple carbohydrates or too many calories at one sitting increases the likelihood of GI side effects. Rebekah [name] agrees to continue taking metformin 500 mg PO daily #90 day supply with no refills. Rebekah [name] agrees to follow up with our clinic in 2 weeks as directed to monitor her progress.  Diabetes risk counseling Rebekah [name] was given extended (15 minutes) diabetes prevention counseling today. She is 58 y.o. female and has risk factors for diabetes including obesity and pre-diabetes. We discussed intensive lifestyle modifications today with an emphasis on weight loss as well as increasing exercise and decreasing simple carbohydrates in her diet.  Obesity Rebekah [name] is currently in the action stage of change. As such, her goal is to continue with weight loss efforts She has agreed to follow the Category 2 plan Rebekah [name] has been instructed to work up to a goal of 150 minutes of combined cardio and strengthening exercise per week for weight loss and overall health benefits. We discussed the following Behavioral  Modification Strategies today: increasing lean protein intake, increasing vegetables, work on meal planning and easy cooking plans, and keeping healthy foods in the home   Rebekah [name] has agreed to follow up with our clinic in 2 weeks. She was informed of the importance of frequent follow up visits to maximize her success with intensive lifestyle modifications for her multiple health conditions.   ALLERGIES: Allergies  Allergen Reactions  . Penicillins Hives and Rash    Has patient had a PCN reaction causing immediate rash, facial/tongue/throat swelling, SOB or lightheadedness with hypotension: Yes Has patient had a PCN reaction causing severe rash involving mucus membranes or skin necrosis: Yes Has patient had a PCN reaction that required hospitalization: No Has patient had a PCN reaction occurring within the last 10 years: No If all of the above answers are "NO", then may proceed with Cephalosporin use.   . Erythromycin Nausea Only    Gastrointestinal issues.    MEDICATIONS: Current Outpatient Medications on File Prior to Visit  Medication Sig Dispense Refill  . aspirin 81 MG tablet Take 1 tablet (81 mg total) by mouth daily. 90 tablet 3  . Azelaic Acid (FINACEA) 15 % FOAM Apply 1 application topically 2 (two) times daily as needed.     Marland Kitchen estradiol (CLIMARA - DOSED IN MG/24 HR) 0.025 mg/24hr patch Place 0.025 mg onto the skin once a week.    Marland Kitchen L-Methylfolate-Algae (DEPLIN 7.5) 7.5-90.314 MG CAPS Take 1 capsule by mouth daily.     . metFORMIN (GLUCOPHAGE) 500 MG tablet Take 1 tablet (500 mg total) by mouth daily with breakfast. 90 tablet 0  . Multiple Vitamin (MULTIVITAMIN) tablet Take 1 tablet by mouth daily. 90 tablet 1  . nortriptyline (PAMELOR) 75 MG capsule Take 1 capsule (75 mg total) by mouth daily. 90 capsule 3  . omeprazole (PRILOSEC) 40 MG capsule Take 1 capsule (40 mg total) by mouth daily. 90 capsule 3  . Probiotic Product (PROBIOTIC-10) CHEW Chew 1 tablet by mouth daily.     . progesterone (PROMETRIUM) 100 MG capsule Take 100 mg by mouth daily.    . ranitidine (ZANTAC) 150 MG tablet Take 150 mg by mouth 2 (two) times daily.    . rosuvastatin (CRESTOR) 10 MG tablet Take 1 tablet (10 mg total) by mouth at bedtime. 90 tablet 1  . Vitamin D, Ergocalciferol, (DRISDOL) 1.25 MG (50000 UT) CAPS capsule Take 1 capsule (50,000 Units total) by mouth every 7 (seven) days. 12  capsule 0   No current facility-administered medications on file prior to visit.     PAST MEDICAL HISTORY: Past Medical History:  Diagnosis Date  . Allergy   . Back pain   . Bowel obstruction ([place]) 2019  . Depression   . Gall bladder stones   . GERD (gastroesophageal reflux disease)   . Hyperlipidemia   . Hypertension   . IBS (irritable bowel syndrome)   . IBS (irritable bowel syndrome)   . Joint pain   . Obesity    Lap band surgery 12-08 Dr Hassell Done  . OSA (obstructive sleep apnea)    using a CPAP    . Pre-diabetes     PAST SURGICAL HISTORY: Past Surgical History:  Procedure Laterality Date  . APPENDECTOMY    . BREAST REDUCTION SURGERY    . CHOLECYSTECTOMY    . CLOSED REDUCTION FINGER WITH PERCUTANEOUS PINNING Left 09/14/2014   Procedure: CLOSED REDUCTION PERCUTANEOUS PINNING ;  Surgeon: Leanora Cover, MD;  Location: [facility];  Service: Orthopedics;  Laterality: Left;  . ESOPHAGEAL MANOMETRY N/A 09/23/2017   Procedure: ESOPHAGEAL MANOMETRY (EM);  Surgeon: Mauri Pole, MD;  Location: [facility];  Service: Endoscopy;  Laterality: N/A;  . Lap Band Adjustment  09/01/2015   Dr. Redmond Pulling  . LAPAROSCOPIC GASTRIC BANDING  09/2007   Lap band surgery 12-08 Dr Hassell Done  . NASAL SINUS SURGERY    . TONSILLECTOMY     x2    SOCIAL HISTORY: Social History   Tobacco Use  . Smoking status: Never Smoker  . Smokeless tobacco: Never Used  Substance Use Topics  . Alcohol use: Yes    Comment: socially   . Drug use: No    FAMILY HISTORY: Family History  Problem Relation Age of Onset  . Coronary artery disease Father        CABG, smoker .age onset 68s  . Prostate cancer Father   . Pulmonary fibrosis Father        and others  . High blood pressure Father   . High Cholesterol Father   . Depression Father   . Obesity Father   . Breast cancer Mother        age 57  . Heart disease Mother        CABG, smoker .age onset 59s  . Diverticulitis Mother   .  High blood pressure Mother   . High Cholesterol Mother   . Thyroid disease Mother   . Depression Mother   . Obesity Mother   . Heart attack Brother        age 66  . Diabetes Neg Hx   . Colon cancer Neg Hx   . Stomach cancer Neg Hx   . Esophageal cancer Neg Hx   . Rectal cancer Neg Hx     ROS: Review of Systems  Constitutional: Positive for malaise/fatigue. Negative for weight loss.  Gastrointestinal: Negative for nausea and vomiting.  Genitourinary: Negative for frequency.  Musculoskeletal:       Negative muscle weakness  Endo/Heme/Allergies: Negative for polydipsia.       Negative hypoglycemia    PHYSICAL EXAM: Blood pressure 115/77, pulse 86, temperature 98.1 F (36.7 C), height 5\' 4"  (1.626 m), weight 183 lb (83 kg), SpO2 97 %. Body mass index is 31.41 kg/m. Physical Exam Vitals signs reviewed.  Constitutional:      Appearance: Normal appearance. She is obese.  Cardiovascular:     Rate and Rhythm: Normal rate.     Pulses: Normal pulses.  Pulmonary:     Effort: Pulmonary effort is normal.     Breath sounds: Normal breath sounds.  Musculoskeletal: Normal range of motion.  Skin:    General: Skin is warm and dry.  Neurological:     Mental Status: She is alert and oriented to person, place, and time.  Psychiatric:        Mood and Affect: Mood normal.        Behavior: Behavior normal.     RECENT LABS AND TESTS: BMET    Component Value Date/Time   NA 137 10/30/2018 1011   K 4.9 10/30/2018 1011   CL 97 10/30/2018 1011   CO2 21 10/30/2018 1011   GLUCOSE 86 10/30/2018 1011   GLUCOSE 74 03/04/2018 0852   GLUCOSE 95 08/24/2010 1048   BUN 14 10/30/2018 1011   CREATININE 1.04 (H) 10/30/2018 1011   CALCIUM 10.4 (H) 10/30/2018 1011   GFRNONAA 60 10/30/2018 1011   GFRAA 69 10/30/2018 1011   Lab Results  Component Value Date  HGBA1C 5.8 (H) 10/30/2018   HGBA1C 6.3 07/10/2018   HGBA1C 6.2 03/04/2018   HGBA1C 6.1 06/05/2017   HGBA1C 6.0 06/14/2016   Lab  Results  Component Value Date   INSULIN 25.1 (H) 10/30/2018   INSULIN 38.3 (H) 07/21/2018   CBC    Component Value Date/Time   WBC 9.0 07/21/2018 1143   WBC 8.7 10/16/2017 1140   RBC 4.60 07/21/2018 1143   RBC 4.58 10/16/2017 1140   HGB 12.8 07/21/2018 1143   HCT 39.1 07/21/2018 1143   PLT 325.0 10/16/2017 1140   MCV 85 07/21/2018 1143   MCH 27.8 07/21/2018 1143   MCH 28.0 09/30/2017 0453   MCHC 32.7 07/21/2018 1143   MCHC 32.7 10/16/2017 1140   RDW 14.7 07/21/2018 1143   LYMPHSABS 3.3 (H) 07/21/2018 1143   MONOABS 0.5 10/16/2017 1140   EOSABS 0.2 07/21/2018 1143   BASOSABS 0.1 07/21/2018 1143   Iron/TIBC/Ferritin/ %Sat    Component Value Date/Time   IRON 86 01/07/2015 0902   FERRITIN 19.1 01/07/2015 0902   IRONPCTSAT 23.2 07/30/2008 0856   Lipid Panel     Component Value Date/Time   CHOL 160 10/30/2018 1011   TRIG 87 10/30/2018 1011   TRIG 99 08/30/2006 0904   HDL 63 10/30/2018 1011   CHOLHDL 2 07/10/2018 0819   VLDL 15.4 07/10/2018 0819   LDLCALC 80 10/30/2018 1011   Hepatic Function Panel     Component Value Date/Time   PROT 8.1 10/30/2018 1011   ALBUMIN 5.1 10/30/2018 1011   AST 24 10/30/2018 1011   ALT 20 10/30/2018 1011   ALKPHOS 80 10/30/2018 1011   BILITOT 0.3 10/30/2018 1011   BILIDIR 0.0 05/15/2012 1453      Component Value Date/Time   TSH 4.580 (H) 10/30/2018 1011   TSH 3.130 07/21/2018 1143   TSH 4.65 (H) 07/10/2018 0819      OBESITY BEHAVIORAL INTERVENTION VISIT  Today's visit was # 13   Starting weight: 198 lbs Starting date: 07/21/18 Today's weight : 183 lbs  Today's date: 04/14/2019 Total lbs lost to date: 15    ASK: We discussed the diagnosis of obesity with Isaiah Serge today and Zakiyah agreed to give [name] permission to discuss obesity behavioral modification therapy today.  ASSESS: Riata has the diagnosis of obesity and her BMI today is 31.4 Kurstin is in the action stage of change   ADVISE: Sharnee was educated on the  multiple health risks of obesity as well as the benefit of weight loss to improve her health. She was advised of the need for long term treatment and the importance of lifestyle modifications to improve her current health and to decrease her risk of future health problems.  AGREE: Multiple dietary modification options and treatment options were discussed and  Jasiah agreed to follow the recommendations documented in the above note.  ARRANGE: Nalleli was educated on the importance of frequent visits to treat obesity as outlined per CMS and USPSTF guidelines and agreed to schedule her next follow up appointment today.  I, Trixie Dredge, am acting as transcriptionist for Ilene Qua, MD  I have reviewed the above documentation for accuracy and completeness, and I agree with the above. - Ilene Qua, MD

## 2019-04-16 MED ORDER — VITAMIN D (ERGOCALCIFEROL) 1.25 MG (50000 UNIT) PO CAPS: 50000.0000 [IU] | ORAL_CAPSULE | ORAL | 0 refills | Status: DC

## 2019-04-16 MED ORDER — METFORMIN HCL 500 MG PO TABS: 500.0000 mg | ORAL_TABLET | Freq: Every day | ORAL | 0 refills | Status: DC

## 2019-04-29 ENCOUNTER — Other Ambulatory Visit: Payer: Self-pay | Admitting: Internal Medicine

## 2019-05-04 ENCOUNTER — Other Ambulatory Visit: Payer: Self-pay

## 2019-05-04 ENCOUNTER — Encounter (INDEPENDENT_AMBULATORY_CARE_PROVIDER_SITE_OTHER): Payer: Self-pay | Admitting: Family Medicine

## 2019-05-04 ENCOUNTER — Ambulatory Visit (INDEPENDENT_AMBULATORY_CARE_PROVIDER_SITE_OTHER): Payer: 59 | Admitting: Family Medicine

## 2019-05-04 VITALS — BP 132/88 | HR 78 | Temp 97.6°F | Ht 64.0 in | Wt 187.0 lb

## 2019-05-04 DIAGNOSIS — F332 Major depressive disorder, recurrent severe without psychotic features: Secondary | ICD-10-CM

## 2019-05-04 DIAGNOSIS — E559 Vitamin D deficiency, unspecified: Secondary | ICD-10-CM

## 2019-05-04 DIAGNOSIS — R7303 Prediabetes: Secondary | ICD-10-CM | POA: Diagnosis not present

## 2019-05-04 DIAGNOSIS — R7989 Other specified abnormal findings of blood chemistry: Secondary | ICD-10-CM

## 2019-05-04 DIAGNOSIS — E669 Obesity, unspecified: Secondary | ICD-10-CM | POA: Diagnosis not present

## 2019-05-04 DIAGNOSIS — Z6832 Body mass index (BMI) 32.0-32.9, adult: Secondary | ICD-10-CM

## 2019-05-04 DIAGNOSIS — Z9189 Other specified personal risk factors, not elsewhere classified: Secondary | ICD-10-CM

## 2019-05-04 NOTE — Progress Notes (Signed)
Office: [phone]  /  Fax: [phone]   HPI:   Chief Complaint: Rebekah Rebekah Rebekah is here to discuss her progress with her Rebekah treatment plan. Rebekah is on the Category 2 plan Rebekah is following her eating plan approximately 75 % of the time. Rebekah states Rebekah is walking the dog 1/2-1 mile 7 times per week. Rebekah Rebekah voices Rebekah has significant chocolate cravings, especially with increase in recent familial stress with the death of one s of her cats. Her next trip is at the end of August or early September to [facility].  Her weight is 187 lb (84.8 kg) today Rebekah has gained 4 lbs since her last visit. Rebekah has lost 11 lbs since starting treatment with Rebekah [name].  Vitamin D Deficiency Rebekah Rebekah has a diagnosis of vitamin D deficiency. Rebekah is currently taking prescription Vit D. Rebekah notes fatigue Rebekah denies nausea, vomiting or muscle weakness.  At risk for osteopenia Rebekah osteoporosis Rebekah Rebekah is at higher risk of osteopenia Rebekah osteoporosis due to vitamin D deficiency.   Pre-Diabetes Rebekah Rebekah has a diagnosis of pre-diabetes based on her elevated Hgb A1c Rebekah Rebekah informed this puts her at greater risk of developing diabetes. Rebekah is taking metformin currently Rebekah notes carbohydrate cravings in form of chocolate. Rebekah continues to work on diet Rebekah exercise to decrease risk of diabetes. Rebekah denies nausea or hypoglycemia.  Elevated TSH Rebekah Rebekah last TSH Rebekah elevated. Rebekah has no diagnosis of hypothyroidism.  Major Depressive Disorder Rebekah Rebekah has a diagnosis of major depressive disorder. Rebekah denies suicidal ideas or homicidal ideas. Rebekah is not on any medications.  ASSESSMENT Rebekah PLAN:  Prediabetes - Plan: Insulin, random, Hemoglobin A1c  Vitamin D deficiency - Plan: VITAMIN D 25 Hydroxy (Vit-D Deficiency, Fractures)  Major depressive disorder, recurrent episode, severe with anxious distress (HCC)  Elevated TSH - Plan: Thyroid Panel With TSH  At risk for osteoporosis  Class 1 Rebekah with serious comorbidity Rebekah  body mass index (BMI) of 32.0 to 32.9 in adult, unspecified Rebekah type  PLAN:  Vitamin D Deficiency Rebekah Rebekah Rebekah informed that low vitamin D levels contributes to fatigue Rebekah are associated with Rebekah, Rebekah Rebekah, Rebekah Rebekah to continue taking prescription Vit D 50,000 IU every week Rebekah will follow up for routine testing of vitamin D, at least 2-3 times per year. Rebekah Rebekah informed of the risk of over-replacement of vitamin D Rebekah Rebekah to not increase her dose unless Rebekah discusses this with Rebekah [name] first. Rebekah Rebekah Rebekah to follow up with our clinic in 2 weeks.  At risk for osteopenia Rebekah osteoporosis Rebekah Rebekah Rebekah given extended (15 minutes) osteoporosis prevention counseling today. Rebekah Rebekah is at risk for osteopenia Rebekah osteoporsis due to her vitamin D deficiency. Rebekah Rebekah encouraged to take her vitamin D Rebekah follow her higher calcium diet Rebekah increase strengthening exercise to help strengthen her bones Rebekah decrease her risk of osteopenia Rebekah osteoporosis.  Pre-Diabetes Rebekah Rebekah will continue to work on weight loss, exercise, Rebekah decreasing simple carbohydrates in her diet to help decrease the risk of diabetes. We dicussed metformin including benefits Rebekah risks. Rebekah Rebekah informed that eating too many simple carbohydrates or too many calories at one sitting increases the likelihood of GI side effects. Rebekah [name] Rebekah to continue taking metformin, Rebekah we will check Hgb A1c Rebekah insulin level today. Rebekah Rebekah Rebekah to follow up with our clinic in 2 weeks as directed to monitor her progress.  Elevated TSH We will check TSH today. Rebekah Rebekah Rebekah to follow up with our  clinic in 2 weeks.  Major Depressive Disorder Rebekah Rebekah is to follow up with her Psychiatrist in 2 days. Rebekah Rebekah Rebekah to follow up with our clinic in 2 weeks.  Rebekah Rebekah Rebekah is currently in the action stage of change. As such, her goal is to continue with weight loss efforts Rebekah has agreed to follow the Category 2 plan Rebekah Rebekah has been instructed to  work up to a goal of 150 minutes of combined cardio Rebekah strengthening exercise per week for weight loss Rebekah overall health benefits. We discussed the following Behavioral Modification Strategies today: increasing lean protein intake Rebekah increasing vegetables   Rebekah Rebekah has agreed to follow up with our clinic in 2 weeks. Rebekah Rebekah informed of the importance of frequent follow up visits to maximize her success with intensive lifestyle modifications for her multiple health conditions.  ALLERGIES: Allergies  Allergen Reactions  . Penicillins Hives Rebekah Rash    Has patient had a PCN reaction causing immediate rash, facial/tongue/throat swelling, SOB or lightheadedness with hypotension: Yes Has patient had a PCN reaction causing severe rash involving mucus membranes or skin necrosis: Yes Has patient had a PCN reaction that required hospitalization: No Has patient had a PCN reaction occurring within the last 10 years: No If all of the above answers are "NO", then may proceed with Cephalosporin use.   . Erythromycin Nausea Only    Gastrointestinal issues.    MEDICATIONS: Current Outpatient Medications on File Prior to Visit  Medication Sig Dispense Refill  . aspirin 81 MG tablet Take 1 tablet (81 mg total) by mouth daily. 90 tablet 3  . Azelaic Acid (FINACEA) 15 % FOAM Apply 1 application topically 2 (two) times daily as needed.     Marland Kitchen estradiol (CLIMARA - DOSED IN MG/24 HR) 0.025 mg/24hr patch Place 0.025 mg onto the skin once a week.    Marland Kitchen L-Methylfolate-Algae (DEPLIN 7.5) 7.5-90.314 MG CAPS Take 1 capsule by mouth daily.     . metFORMIN (GLUCOPHAGE) 500 MG tablet Take 1 tablet (500 mg total) by mouth daily with breakfast. 90 tablet 0  . Multiple Vitamin (MULTIVITAMIN) tablet Take 1 tablet by mouth daily. 90 tablet 1  . nortriptyline (PAMELOR) 75 MG capsule Take 1 capsule (75 mg total) by mouth daily. 90 capsule 3  . omeprazole (PRILOSEC) 40 MG capsule Take 1 capsule (40 mg total) by mouth daily. 90  capsule 3  . Probiotic Product (PROBIOTIC-10) CHEW Chew 1 tablet by mouth daily.     . progesterone (PROMETRIUM) 100 MG capsule Take 100 mg by mouth daily.    . rosuvastatin (CRESTOR) 10 MG tablet Take 1 tablet (10 mg total) by mouth at bedtime. 90 tablet 0  . Vitamin D, Ergocalciferol, (DRISDOL) 1.25 MG (50000 UT) CAPS capsule Take 1 capsule (50,000 Units total) by mouth every 7 (seven) days. 12 capsule 0  . ranitidine (ZANTAC) 150 MG tablet Take 150 mg by mouth 2 (two) times daily.     No current facility-administered medications on file prior to visit.     PAST MEDICAL HISTORY: Past Medical History:  Diagnosis Date  . Allergy   . Back pain   . Bowel obstruction ([place]) 2019  . Depression   . Gall bladder stones   . GERD (gastroesophageal reflux disease)   . Hyperlipidemia   . Hypertension   . IBS (irritable bowel syndrome)   . IBS (irritable bowel syndrome)   . Joint pain   . Rebekah    Lap band surgery 12-08 Dr Hassell Done  .  OSA (obstructive sleep apnea)    using a CPAP    . Pre-diabetes     PAST SURGICAL HISTORY: Past Surgical History:  Procedure Laterality Date  . APPENDECTOMY    . Rebekah Rebekah REDUCTION SURGERY    . CHOLECYSTECTOMY    . CLOSED REDUCTION FINGER WITH PERCUTANEOUS PINNING Left 09/14/2014   Procedure: CLOSED REDUCTION PERCUTANEOUS PINNING ;  Surgeon: Leanora Cover, MD;  Location: [facility];  Service: Orthopedics;  Laterality: Left;  . ESOPHAGEAL MANOMETRY N/A 09/23/2017   Procedure: ESOPHAGEAL MANOMETRY (EM);  Surgeon: Mauri Pole, MD;  Location: [facility];  Service: Endoscopy;  Laterality: N/A;  . Lap Band Adjustment  09/01/2015   Dr. Redmond Pulling  . LAPAROSCOPIC GASTRIC BANDING  09/2007   Lap band surgery 12-08 Dr Hassell Done  . NASAL SINUS SURGERY    . TONSILLECTOMY     x2    SOCIAL HISTORY: Social History   Tobacco Use  . Smoking status: Never Smoker  . Smokeless tobacco: Never Used  Substance Use Topics  . Alcohol use: Yes     Comment: socially   . Drug use: No    FAMILY HISTORY: Family History  Problem Relation Age of Onset  . Coronary artery disease Father        CABG, smoker .age onset 37s  . Prostate cancer Father   . Pulmonary fibrosis Father        Rebekah others  . High blood pressure Father   . High Cholesterol Father   . Depression Father   . Rebekah Father   . Rebekah Rebekah cancer Mother        age 43  . Heart disease Mother        CABG, smoker .age onset 71s  . Diverticulitis Mother   . High blood pressure Mother   . High Cholesterol Mother   . Thyroid disease Mother   . Depression Mother   . Rebekah Mother   . Heart attack Brother        age 38  . Diabetes Neg Hx   . Colon cancer Neg Hx   . Stomach cancer Neg Hx   . Esophageal cancer Neg Hx   . Rectal cancer Neg Hx     ROS: Review of Systems  Constitutional: Positive for malaise/fatigue. Negative for weight loss.  Gastrointestinal: Negative for nausea Rebekah vomiting.  Musculoskeletal:       Negative muscle weakness  Endo/Heme/Allergies:       Negative hypoglycemia  Psychiatric/Behavioral: Positive for depression. Negative for suicidal ideas.    PHYSICAL EXAM: Blood pressure 132/88, pulse 78, temperature 97.6 F (36.4 C), temperature source Oral, height 5\' 4"  (1.626 m), weight 187 lb (84.8 kg), SpO2 96 %. Body mass index is 32.1 kg/m. Physical Exam Vitals signs reviewed.  Constitutional:      Appearance: Normal appearance. Rebekah is obese.  Cardiovascular:     Rate Rebekah Rhythm: Normal rate.     Pulses: Normal pulses.  Pulmonary:     Effort: Pulmonary effort is normal.     Breath sounds: Normal breath sounds.  Musculoskeletal: Normal range of motion.  Skin:    General: Skin is warm Rebekah dry.  Neurological:     Mental Status: Rebekah is alert Rebekah oriented to person, place, Rebekah time.  Psychiatric:        Mood Rebekah Affect: Mood normal.        Behavior: Behavior normal.     RECENT LABS Rebekah TESTS: BMET    Component Value  Date/Time    NA 137 10/30/2018 1011   K 4.9 10/30/2018 1011   CL 97 10/30/2018 1011   CO2 21 10/30/2018 1011   GLUCOSE 86 10/30/2018 1011   GLUCOSE 74 03/04/2018 0852   GLUCOSE 95 08/24/2010 1048   BUN 14 10/30/2018 1011   CREATININE 1.04 (H) 10/30/2018 1011   CALCIUM 10.4 (H) 10/30/2018 1011   GFRNONAA 60 10/30/2018 1011   GFRAA 69 10/30/2018 1011   Lab Results  Component Value Date   HGBA1C 5.8 (H) 10/30/2018   HGBA1C 6.3 07/10/2018   HGBA1C 6.2 03/04/2018   HGBA1C 6.1 06/05/2017   HGBA1C 6.0 06/14/2016   Lab Results  Component Value Date   INSULIN 25.1 (H) 10/30/2018   INSULIN 38.3 (H) 07/21/2018   CBC    Component Value Date/Time   WBC 9.0 07/21/2018 1143   WBC 8.7 10/16/2017 1140   RBC 4.60 07/21/2018 1143   RBC 4.58 10/16/2017 1140   HGB 12.8 07/21/2018 1143   HCT 39.1 07/21/2018 1143   PLT 325.0 10/16/2017 1140   MCV 85 07/21/2018 1143   MCH 27.8 07/21/2018 1143   MCH 28.0 09/30/2017 0453   MCHC 32.7 07/21/2018 1143   MCHC 32.7 10/16/2017 1140   RDW 14.7 07/21/2018 1143   LYMPHSABS 3.3 (H) 07/21/2018 1143   MONOABS 0.5 10/16/2017 1140   EOSABS 0.2 07/21/2018 1143   BASOSABS 0.1 07/21/2018 1143   Iron/TIBC/Ferritin/ %Sat    Component Value Date/Time   IRON 86 01/07/2015 0902   FERRITIN 19.1 01/07/2015 0902   IRONPCTSAT 23.2 07/30/2008 0856   Lipid Panel     Component Value Date/Time   CHOL 160 10/30/2018 1011   TRIG 87 10/30/2018 1011   TRIG 99 08/30/2006 0904   HDL 63 10/30/2018 1011   CHOLHDL 2 07/10/2018 0819   VLDL 15.4 07/10/2018 0819   LDLCALC 80 10/30/2018 1011   Hepatic Function Panel     Component Value Date/Time   PROT 8.1 10/30/2018 1011   ALBUMIN 5.1 10/30/2018 1011   AST 24 10/30/2018 1011   ALT 20 10/30/2018 1011   ALKPHOS 80 10/30/2018 1011   BILITOT 0.3 10/30/2018 1011   BILIDIR 0.0 05/15/2012 1453      Component Value Date/Time   TSH 4.580 (H) 10/30/2018 1011   TSH 3.130 07/21/2018 1143   TSH 4.65 (H) 07/10/2018 0819       Rebekah BEHAVIORAL INTERVENTION VISIT  Today's visit Rebekah # 14   Starting weight: 198 lbs Starting date: 07/21/18 Today's weight : 187 lbs Today's date: 05/04/2019 Total lbs lost to date: 11    ASK: We discussed the diagnosis of Rebekah with Isaiah Serge today Rebekah Arnetha agreed to give Rebekah [name] permission to discuss Rebekah behavioral modification therapy today.  ASSESS: Camri has the diagnosis of Rebekah Rebekah her BMI today is 32.08 Zakirah is in the action stage of change   ADVISE: Ly Rebekah educated on the multiple health risks of Rebekah as well as the benefit of weight loss to improve her health. Rebekah Rebekah advised of the need for long term treatment Rebekah the importance of lifestyle modifications to improve her current health Rebekah to decrease her risk of future health problems.  AGREE: Multiple dietary modification options Rebekah treatment options were discussed Rebekah  Terrie agreed to follow the recommendations documented in the above note.  ARRANGE: Tamya Rebekah educated on the importance of frequent visits to treat Rebekah as outlined per CMS Rebekah USPSTF guidelines Rebekah agreed to schedule her  next follow up appointment today.  I, Trixie Dredge, am acting as transcriptionist for Ilene Qua, MD  I have reviewed the above documentation for accuracy Rebekah completeness, Rebekah I agree with the above. - Ilene Qua, MD

## 2019-05-05 LAB — TSH: TSH: 3.3 u[IU]/mL (ref 0.450–4.500)

## 2019-05-05 LAB — HEMOGLOBIN A1C
Est. average glucose Bld gHb Est-mCnc: 123 mg/dL
Hgb A1c MFr Bld: 5.9 % — ABNORMAL HIGH (ref 4.8–5.6)

## 2019-05-05 LAB — VITAMIN D 25 HYDROXY (VIT D DEFICIENCY, FRACTURES): Vit D, 25-Hydroxy: 57.2 ng/mL (ref 30.0–100.0)

## 2019-05-05 LAB — INSULIN, RANDOM: INSULIN: 21.2 u[IU]/mL (ref 2.6–24.9)

## 2019-05-17 ENCOUNTER — Other Ambulatory Visit (INDEPENDENT_AMBULATORY_CARE_PROVIDER_SITE_OTHER): Payer: Self-pay | Admitting: Family Medicine

## 2019-05-17 DIAGNOSIS — E559 Vitamin D deficiency, unspecified: Secondary | ICD-10-CM

## 2019-05-19 ENCOUNTER — Other Ambulatory Visit: Payer: Self-pay

## 2019-05-19 ENCOUNTER — Encounter: Payer: Self-pay | Admitting: Internal Medicine

## 2019-05-19 ENCOUNTER — Ambulatory Visit (INDEPENDENT_AMBULATORY_CARE_PROVIDER_SITE_OTHER): Payer: 59 | Admitting: Family Medicine

## 2019-05-19 ENCOUNTER — Encounter (INDEPENDENT_AMBULATORY_CARE_PROVIDER_SITE_OTHER): Payer: Self-pay | Admitting: Family Medicine

## 2019-05-19 VITALS — BP 131/85 | HR 75 | Temp 98.4°F | Ht 64.0 in | Wt 185.0 lb

## 2019-05-19 DIAGNOSIS — Z9189 Other specified personal risk factors, not elsewhere classified: Secondary | ICD-10-CM | POA: Diagnosis not present

## 2019-05-19 DIAGNOSIS — R7303 Prediabetes: Secondary | ICD-10-CM | POA: Diagnosis not present

## 2019-05-19 DIAGNOSIS — E66811 Obesity, class 1: Secondary | ICD-10-CM

## 2019-05-19 DIAGNOSIS — E669 Obesity, unspecified: Secondary | ICD-10-CM

## 2019-05-19 DIAGNOSIS — E559 Vitamin D deficiency, unspecified: Secondary | ICD-10-CM

## 2019-05-19 DIAGNOSIS — Z6831 Body mass index (BMI) 31.0-31.9, adult: Secondary | ICD-10-CM | POA: Diagnosis not present

## 2019-05-19 MED ORDER — VITAMIN D (ERGOCALCIFEROL) 1.25 MG (50000 UNIT) PO CAPS: 50000.0000 [IU] | ORAL_CAPSULE | ORAL | 0 refills | Status: DC

## 2019-05-19 NOTE — Progress Notes (Signed)
Office: [phone]  /  Fax: [phone]   HPI:   Chief Complaint: OBESITY Rebekah [name] is here to discuss her progress with her obesity treatment plan. She is on the Category 2 plan and is following her eating plan approximately 50 % of the time. She states she is doing a lot of gardening. Rebekah [name] voices the last 2 weeks she felt better than the last 2 weeks prior. She went shopping yesterday with her mother and feels energized. She still voices she is struggling with snacking on [employer]. She does still like Category 2 meal plan.  Her weight is 185 lb (83.9 kg) today and has had a weight loss of 2 pounds over a period of 2 weeks since her last visit. She has lost 13 lbs since starting treatment with [name].  Vitamin D Deficiency Rebekah [name] has a diagnosis of vitamin D deficiency. She is currently taking prescription Vit D. She notes fatigue and denies nausea, vomiting or muscle weakness.  At risk for osteopenia and osteoporosis Rebekah [name] is at higher risk of osteopenia and osteoporosis due to vitamin D deficiency.   Pre-Diabetes Rebekah [name] has a diagnosis of pre-diabetes based on her elevated Hgb A1c and was informed this puts her at greater risk of developing diabetes. Last Hgb A1c was of 5.9 (slightly elevated of 5.8), and insulin of 21.2. She is taking metformin currently and continues to work on diet and exercise to decrease risk of diabetes.   ASSESSMENT AND PLAN:  Prediabetes  Vitamin D deficiency - Plan: Vitamin D, Ergocalciferol, (DRISDOL) 1.25 MG (50000 UT) CAPS capsule  At risk for osteoporosis  Class 1 obesity with serious comorbidity and body mass index (BMI) of 31.0 to 31.9 in adult, unspecified obesity type  PLAN:  Vitamin D Deficiency Rebekah [name] was informed that low vitamin D levels contributes to fatigue and are associated with obesity, breast, and colon cancer. Rebekah [name] agrees to continue taking prescription Vit D 50,000 IU every week #4 and we will refill for 1 month. She will follow up for  routine testing of vitamin D, at least 2-3 times per year. She was informed of the risk of over-replacement of vitamin D and agrees to not increase her dose unless she discusses this with [name] first. Rebekah [name] agrees to follow up with our clinic in 2 weeks.  At risk for osteopenia and osteoporosis Rebekah [name] was given extended (15 minutes) osteoporosis prevention counseling today. Rebekah [name] is at risk for osteopenia and osteoporsis due to her vitamin D deficiency. She was encouraged to take her vitamin D and follow her higher calcium diet and increase strengthening exercise to help strengthen her bones and decrease her risk of osteopenia and osteoporosis.  Pre-Diabetes Rebekah [name] will continue to work on weight loss, exercise, and decreasing simple carbohydrates in her diet to help decrease the risk of diabetes. We dicussed metformin including benefits and risks. She was informed that eating too many simple carbohydrates or too many calories at one sitting increases the likelihood of GI side effects. Rebekah [name] agrees to continue taking metformin, and we will repeat labs in 3 months. Rebekah [name] agrees to follow up with our clinic in 2 weeks as directed to monitor her progress.  Obesity Rebekah [name] is currently in the action stage of change. As such, her goal is to continue with weight loss efforts She has agreed to follow the Category 2 plan Rebekah [name] has been instructed to work up to a goal of 150 minutes of combined cardio and strengthening exercise per week for weight loss and  overall health benefits. We discussed the following Behavioral Modification Strategies today: increasing lean protein intake, increasing vegetables and work on meal planning and easy cooking plans, keeping healthy foods in the home, better snacking choices, and planning for success   Rebekah [name] has agreed to follow up with our clinic in 2 weeks. She was informed of the importance of frequent follow up visits to maximize her success with intensive lifestyle  modifications for her multiple health conditions.  ALLERGIES: Allergies  Allergen Reactions  . Penicillins Hives and Rash    Has patient had a PCN reaction causing immediate rash, facial/tongue/throat swelling, SOB or lightheadedness with hypotension: Yes Has patient had a PCN reaction causing severe rash involving mucus membranes or skin necrosis: Yes Has patient had a PCN reaction that required hospitalization: No Has patient had a PCN reaction occurring within the last 10 years: No If all of the above answers are "NO", then may proceed with Cephalosporin use.   . Erythromycin Nausea Only    Gastrointestinal issues.    MEDICATIONS: Current Outpatient Medications on File Prior to Visit  Medication Sig Dispense Refill  . aspirin 81 MG tablet Take 1 tablet (81 mg total) by mouth daily. 90 tablet 3  . Azelaic Acid (FINACEA) 15 % FOAM Apply 1 application topically 2 (two) times daily as needed.     Marland Kitchen estradiol (CLIMARA - DOSED IN MG/24 HR) 0.025 mg/24hr patch Place 0.025 mg onto the skin once a week.    Marland Kitchen L-Methylfolate-Algae (DEPLIN 7.5) 7.5-90.314 MG CAPS Take 1 capsule by mouth daily.     . metFORMIN (GLUCOPHAGE) 500 MG tablet Take 1 tablet (500 mg total) by mouth daily with breakfast. 90 tablet 0  . Multiple Vitamin (MULTIVITAMIN) tablet Take 1 tablet by mouth daily. 90 tablet 1  . nortriptyline (PAMELOR) 75 MG capsule Take 1 capsule (75 mg total) by mouth daily. 90 capsule 3  . omeprazole (PRILOSEC) 40 MG capsule Take 1 capsule (40 mg total) by mouth daily. 90 capsule 3  . Probiotic Product (PROBIOTIC-10) CHEW Chew 1 tablet by mouth daily.     . progesterone (PROMETRIUM) 100 MG capsule Take 100 mg by mouth daily.    . ranitidine (ZANTAC) 150 MG tablet Take 150 mg by mouth 2 (two) times daily.    . rosuvastatin (CRESTOR) 10 MG tablet Take 1 tablet (10 mg total) by mouth at bedtime. 90 tablet 0   No current facility-administered medications on file prior to visit.     PAST MEDICAL  HISTORY: Past Medical History:  Diagnosis Date  . Allergy   . Back pain   . Bowel obstruction ([place]) 2019  . Depression   . Gall bladder stones   . GERD (gastroesophageal reflux disease)   . Hyperlipidemia   . Hypertension   . IBS (irritable bowel syndrome)   . IBS (irritable bowel syndrome)   . Joint pain   . Obesity    Lap band surgery 12-08 Dr Hassell Done  . OSA (obstructive sleep apnea)    using a CPAP    . Pre-diabetes     PAST SURGICAL HISTORY: Past Surgical History:  Procedure Laterality Date  . APPENDECTOMY    . BREAST REDUCTION SURGERY    . CHOLECYSTECTOMY    . CLOSED REDUCTION FINGER WITH PERCUTANEOUS PINNING Left 09/14/2014   Procedure: CLOSED REDUCTION PERCUTANEOUS PINNING ;  Surgeon: Leanora Cover, MD;  Location: [facility];  Service: Orthopedics;  Laterality: Left;  . ESOPHAGEAL MANOMETRY N/A 09/23/2017   Procedure:  ESOPHAGEAL MANOMETRY (EM);  Surgeon: Mauri Pole, MD;  Location: [facility];  Service: Endoscopy;  Laterality: N/A;  . Lap Band Adjustment  09/01/2015   Dr. Redmond Pulling  . LAPAROSCOPIC GASTRIC BANDING  09/2007   Lap band surgery 12-08 Dr Hassell Done  . NASAL SINUS SURGERY    . TONSILLECTOMY     x2    SOCIAL HISTORY: Social History   Tobacco Use  . Smoking status: Never Smoker  . Smokeless tobacco: Never Used  Substance Use Topics  . Alcohol use: Yes    Comment: socially   . Drug use: No    FAMILY HISTORY: Family History  Problem Relation Age of Onset  . Coronary artery disease Father        CABG, smoker .age onset 33s  . Prostate cancer Father   . Pulmonary fibrosis Father        and others  . High blood pressure Father   . High Cholesterol Father   . Depression Father   . Obesity Father   . Breast cancer Mother        age 38  . Heart disease Mother        CABG, smoker .age onset 58s  . Diverticulitis Mother   . High blood pressure Mother   . High Cholesterol Mother   . Thyroid disease Mother   . Depression Mother    . Obesity Mother   . Heart attack Brother        age 66  . Diabetes Neg Hx   . Colon cancer Neg Hx   . Stomach cancer Neg Hx   . Esophageal cancer Neg Hx   . Rectal cancer Neg Hx     ROS: Review of Systems  Constitutional: Positive for malaise/fatigue and weight loss.  Gastrointestinal: Negative for nausea and vomiting.  Musculoskeletal:       Negative muscle weakness    PHYSICAL EXAM: Blood pressure 131/85, pulse 75, temperature 98.4 F (36.9 C), temperature source Oral, height 5\' 4"  (1.626 m), weight 185 lb (83.9 kg), SpO2 97 %. Body mass index is 31.76 kg/m. Physical Exam Vitals signs reviewed.  Constitutional:      Appearance: Normal appearance. She is obese.  Cardiovascular:     Rate and Rhythm: Normal rate.     Pulses: Normal pulses.  Pulmonary:     Effort: Pulmonary effort is normal.     Breath sounds: Normal breath sounds.  Musculoskeletal: Normal range of motion.  Skin:    General: Skin is warm and dry.  Neurological:     Mental Status: She is alert and oriented to person, place, and time.  Psychiatric:        Mood and Affect: Mood normal.        Behavior: Behavior normal.     RECENT LABS AND TESTS: BMET    Component Value Date/Time   NA 137 10/30/2018 1011   K 4.9 10/30/2018 1011   CL 97 10/30/2018 1011   CO2 21 10/30/2018 1011   GLUCOSE 86 10/30/2018 1011   GLUCOSE 74 03/04/2018 0852   GLUCOSE 95 08/24/2010 1048   BUN 14 10/30/2018 1011   CREATININE 1.04 (H) 10/30/2018 1011   CALCIUM 10.4 (H) 10/30/2018 1011   GFRNONAA 60 10/30/2018 1011   GFRAA 69 10/30/2018 1011   Lab Results  Component Value Date   HGBA1C 5.9 (H) 05/04/2019   HGBA1C 5.8 (H) 10/30/2018   HGBA1C 6.3 07/10/2018   HGBA1C 6.2 03/04/2018   HGBA1C 6.1 06/05/2017  Lab Results  Component Value Date   INSULIN 21.2 05/04/2019   INSULIN 25.1 (H) 10/30/2018   INSULIN 38.3 (H) 07/21/2018   CBC    Component Value Date/Time   WBC 9.0 07/21/2018 1143   WBC 8.7 10/16/2017  1140   RBC 4.60 07/21/2018 1143   RBC 4.58 10/16/2017 1140   HGB 12.8 07/21/2018 1143   HCT 39.1 07/21/2018 1143   PLT 325.0 10/16/2017 1140   MCV 85 07/21/2018 1143   MCH 27.8 07/21/2018 1143   MCH 28.0 09/30/2017 0453   MCHC 32.7 07/21/2018 1143   MCHC 32.7 10/16/2017 1140   RDW 14.7 07/21/2018 1143   LYMPHSABS 3.3 (H) 07/21/2018 1143   MONOABS 0.5 10/16/2017 1140   EOSABS 0.2 07/21/2018 1143   BASOSABS 0.1 07/21/2018 1143   Iron/TIBC/Ferritin/ %Sat    Component Value Date/Time   IRON 86 01/07/2015 0902   FERRITIN 19.1 01/07/2015 0902   IRONPCTSAT 23.2 07/30/2008 0856   Lipid Panel     Component Value Date/Time   CHOL 160 10/30/2018 1011   TRIG 87 10/30/2018 1011   TRIG 99 08/30/2006 0904   HDL 63 10/30/2018 1011   CHOLHDL 2 07/10/2018 0819   VLDL 15.4 07/10/2018 0819   LDLCALC 80 10/30/2018 1011   Hepatic Function Panel     Component Value Date/Time   PROT 8.1 10/30/2018 1011   ALBUMIN 5.1 10/30/2018 1011   AST 24 10/30/2018 1011   ALT 20 10/30/2018 1011   ALKPHOS 80 10/30/2018 1011   BILITOT 0.3 10/30/2018 1011   BILIDIR 0.0 05/15/2012 1453      Component Value Date/Time   TSH 3.300 05/04/2019 1006   TSH 4.580 (H) 10/30/2018 1011   TSH 3.130 07/21/2018 1143      OBESITY BEHAVIORAL INTERVENTION VISIT  Today's visit was # 15   Starting weight: 198 lbs Starting date: 07/21/18 Today's weight : 185 lbs Today's date: 05/19/2019 Total lbs lost to date: 12    ASK: We discussed the diagnosis of obesity with Rebekah [name] today and Rebekah [name] agreed to give [name] permission to discuss obesity behavioral modification therapy today.  ASSESS: Rebekah [name] has the diagnosis of obesity and her BMI today is 31.74 Rebekah [name] is in the action stage of change   ADVISE: Rebekah [name] was educated on the multiple health risks of obesity as well as the benefit of weight loss to improve her health. She was advised of the need for long term treatment and the importance of lifestyle  modifications to improve her current health and to decrease her risk of future health problems.  AGREE: Multiple dietary modification options and treatment options were discussed and  Rebekah [name] agreed to follow the recommendations documented in the above note.  ARRANGE: Rebekah [name] was educated on the importance of frequent visits to treat obesity as outlined per CMS and USPSTF guidelines and agreed to schedule her next follow up appointment today.  I, Trixie Dredge, am acting as transcriptionist for Ilene Qua, MD  I have reviewed the above documentation for accuracy and completeness, and I agree with the above. - Ilene Qua, MD

## 2019-06-09 ENCOUNTER — Other Ambulatory Visit: Payer: Self-pay

## 2019-06-09 ENCOUNTER — Encounter (INDEPENDENT_AMBULATORY_CARE_PROVIDER_SITE_OTHER): Payer: Self-pay | Admitting: Family Medicine

## 2019-06-09 ENCOUNTER — Ambulatory Visit (INDEPENDENT_AMBULATORY_CARE_PROVIDER_SITE_OTHER): Payer: 59 | Admitting: Family Medicine

## 2019-06-09 VITALS — BP 142/91 | HR 75 | Temp 97.9°F | Ht 64.0 in | Wt 184.0 lb

## 2019-06-09 DIAGNOSIS — Z6831 Body mass index (BMI) 31.0-31.9, adult: Secondary | ICD-10-CM

## 2019-06-09 DIAGNOSIS — E7849 Other hyperlipidemia: Secondary | ICD-10-CM | POA: Diagnosis not present

## 2019-06-09 DIAGNOSIS — R7303 Prediabetes: Secondary | ICD-10-CM | POA: Diagnosis not present

## 2019-06-09 DIAGNOSIS — E669 Obesity, unspecified: Secondary | ICD-10-CM

## 2019-06-09 NOTE — Progress Notes (Signed)
Office: [phone]  /  Fax: [phone]   HPI:   Chief Complaint: OBESITY Rebekah [name] is here to discuss her progress with her obesity treatment plan. She is on the  follow the Category 2 plan and is following her eating plan approximately 60 % of the time. She states she is exercising 0 minutes 0 times per week. Tamme has a stressful last few days. 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 She did a significant amount of canning and freezing this past weekend. She is feeling burnt out. She is going to the beach the last week of August/first week of September.  Her weight is 184 lb (83.5 kg) today and has had a weight loss of 1 pounds over a period of 2 weeks since her last visit. She has lost 14 lbs since starting treatment with [name].  Hyperlipidemia Rebekah [name] has hyperlipidemia, LDL of 80 and has been trying to improve her cholesterol levels with intensive lifestyle modification including a low saturated fat diet, exercise and weight loss. She denies any chest pain, claudication or myalgias. She is currently on a statin.   Pre-Diabetes Rebekah [name] has a diagnosis of prediabetes based on her elevated HgA1c of 5.9 and Insulin of 21.2 and was informed this puts her at greater risk of developing diabetes. She is taking metformin currently and continues to work on diet and exercise to decrease risk of diabetes. She denies nausea or hypoglycemia.  ASSESSMENT AND PLAN:  Other hyperlipidemia  Prediabetes  Class 1 obesity with serious comorbidity and body mass index (BMI) of 31.0 to 31.9 in adult, unspecified obesity type  PLAN: Hyperlipidemia Rebekah [name] was informed of the American Heart Association Guidelines emphasizing intensive lifestyle modifications as the first line treatment for hyperlipidemia. We discussed many lifestyle modifications today in depth, and Rebekah [name] will continue to work on decreasing saturated fats such as fatty red meat, butter and many fried foods. She will also increase  vegetables and lean protein in her diet and continue to work on exercise and weight loss efforts. We will repeat Lipid panel early Nov.   Pre-Diabetes Rebekah [name] will continue to work on weight loss, exercise, and decreasing simple carbohydrates in her diet to help decrease the risk of diabetes. We dicussed metformin including benefits and risks. She was informed that eating too many simple carbohydrates or too many calories at one sitting increases the likelihood of GI side effects. Rebekah [name] agrees to continue Metformin, no refills needed. Rebekah [name] agreed to follow up with [name] as directed to monitor her progress.  'I spent > than 50% of the 15 minute visit on counseling as documented in the note.  Obesity Rebekah [name] is currently in the action stage of change. As such, her goal is to continue with weight loss efforts She has agreed to follow the Category 2 plan Rebekah [name] has been instructed to work up to a goal of 150 minutes of combined cardio and strengthening exercise per week for weight loss and overall health benefits. We discussed the following Behavioral Modification Strategies today:keeping healthy foods in the home, planning for success, increasing lean protein intake, increasing vegetables and work on meal planning and easy cooking plans.  Rebekah [name] has agreed to follow up with our clinic in 2-3 weeks. She was informed of the importance of frequent follow up visits to maximize her success with intensive lifestyle modifications for her multiple health conditions.  ALLERGIES: Allergies  Allergen Reactions  . Penicillins Hives and Rash    Has patient had a PCN reaction causing  immediate rash, facial/tongue/throat swelling, SOB or lightheadedness with hypotension: Yes Has patient had a PCN reaction causing severe rash involving mucus membranes or skin necrosis: Yes Has patient had a PCN reaction that required hospitalization: No Has patient had a PCN reaction occurring within the last 10 years: No If all of the  above answers are "NO", then may proceed with Cephalosporin use.   . Erythromycin Nausea Only    Gastrointestinal issues.    MEDICATIONS: Current Outpatient Medications on File Prior to Visit  Medication Sig Dispense Refill  . aspirin 81 MG tablet Take 1 tablet (81 mg total) by mouth daily. 90 tablet 3  . Azelaic Acid (FINACEA) 15 % FOAM Apply 1 application topically 2 (two) times daily as needed.     . clonazePAM (KLONOPIN) 0.25 MG disintegrating tablet Take 0.25 mg by mouth 2 (two) times daily.    Marland Kitchen estradiol (CLIMARA - DOSED IN MG/24 HR) 0.025 mg/24hr patch Place 0.025 mg onto the skin once a week.    Marland Kitchen L-Methylfolate-Algae (DEPLIN 7.5) 7.5-90.314 MG CAPS Take 1 capsule by mouth daily.     . metFORMIN (GLUCOPHAGE) 500 MG tablet Take 1 tablet (500 mg total) by mouth daily with breakfast. 90 tablet 0  . Multiple Vitamin (MULTIVITAMIN) tablet Take 1 tablet by mouth daily. 90 tablet 1  . nortriptyline (PAMELOR) 75 MG capsule Take 1 capsule (75 mg total) by mouth daily. 90 capsule 3  . omeprazole (PRILOSEC) 40 MG capsule Take 1 capsule (40 mg total) by mouth daily. 90 capsule 3  . Probiotic Product (PROBIOTIC-10) CHEW Chew 1 tablet by mouth daily.     . progesterone (PROMETRIUM) 100 MG capsule Take 100 mg by mouth daily.    . rosuvastatin (CRESTOR) 10 MG tablet Take 1 tablet (10 mg total) by mouth at bedtime. 90 tablet 0  . Vitamin D, Ergocalciferol, (DRISDOL) 1.25 MG (50000 UT) CAPS capsule Take 1 capsule (50,000 Units total) by mouth every 14 (fourteen) days. 6 capsule 0   No current facility-administered medications on file prior to visit.     PAST MEDICAL HISTORY: Past Medical History:  Diagnosis Date  . Allergy   . Back pain   . Bowel obstruction ([place]) 2019  . Depression   . Gall bladder stones   . GERD (gastroesophageal reflux disease)   . Hyperlipidemia   . Hypertension   . IBS (irritable bowel syndrome)   . IBS (irritable bowel syndrome)   . Joint pain   . Obesity     Lap band surgery 12-08 Dr Hassell Done  . OSA (obstructive sleep apnea)    using a CPAP    . Pre-diabetes     PAST SURGICAL HISTORY: Past Surgical History:  Procedure Laterality Date  . APPENDECTOMY    . BREAST REDUCTION SURGERY    . CHOLECYSTECTOMY    . CLOSED REDUCTION FINGER WITH PERCUTANEOUS PINNING Left 09/14/2014   Procedure: CLOSED REDUCTION PERCUTANEOUS PINNING ;  Surgeon: Leanora Cover, MD;  Location: [facility];  Service: Orthopedics;  Laterality: Left;  . ESOPHAGEAL MANOMETRY N/A 09/23/2017   Procedure: ESOPHAGEAL MANOMETRY (EM);  Surgeon: Mauri Pole, MD;  Location: [facility];  Service: Endoscopy;  Laterality: N/A;  . Lap Band Adjustment  09/01/2015   Dr. Redmond Pulling  . LAPAROSCOPIC GASTRIC BANDING  09/2007   Lap band surgery 12-08 Dr Hassell Done  . NASAL SINUS SURGERY    . TONSILLECTOMY     x2    SOCIAL HISTORY: Social History   Tobacco Use  .  Smoking status: Never Smoker  . Smokeless tobacco: Never Used  Substance Use Topics  . Alcohol use: Yes    Comment: socially   . Drug use: No    FAMILY HISTORY: Family History  Problem Relation Age of Onset  . Coronary artery disease Father        CABG, smoker .age onset 74s  . Prostate cancer Father   . Pulmonary fibrosis Father        and others  . High blood pressure Father   . High Cholesterol Father   . Depression Father   . Obesity Father   . Breast cancer Mother        age 84  . Heart disease Mother        CABG, smoker .age onset 61s  . Diverticulitis Mother   . High blood pressure Mother   . High Cholesterol Mother   . Thyroid disease Mother   . Depression Mother   . Obesity Mother   . Heart attack Brother        age 74  . Diabetes Neg Hx   . Colon cancer Neg Hx   . Stomach cancer Neg Hx   . Esophageal cancer Neg Hx   . Rectal cancer Neg Hx     ROS: Review of Systems  Constitutional: Positive for weight loss.  Cardiovascular: Negative for chest pain and claudication.   Musculoskeletal: Negative for myalgias.  Endo/Heme/Allergies:       Negative for hypoglycemia     PHYSICAL EXAM: Blood pressure (!) 142/91, pulse 75, temperature 97.9 F (36.6 C), temperature source Oral, height 5\' 4"  (1.626 m), weight 184 lb (83.5 kg), SpO2 96 %. Body mass index is 31.58 kg/m. Physical Exam Vitals signs reviewed.  Constitutional:      Appearance: Normal appearance. She is obese.  HENT:     Head: Normocephalic.     Nose: Nose normal.  Neck:     Musculoskeletal: Normal range of motion.  Cardiovascular:     Rate and Rhythm: Normal rate.  Musculoskeletal: Normal range of motion.  Skin:    General: Skin is warm and dry.  Neurological:     Mental Status: She is alert and oriented to person, place, and time.  Psychiatric:        Mood and Affect: Mood normal.        Behavior: Behavior normal.     RECENT LABS AND TESTS: BMET    Component Value Date/Time   NA 137 10/30/2018 1011   K 4.9 10/30/2018 1011   CL 97 10/30/2018 1011   CO2 21 10/30/2018 1011   GLUCOSE 86 10/30/2018 1011   GLUCOSE 74 03/04/2018 0852   GLUCOSE 95 08/24/2010 1048   BUN 14 10/30/2018 1011   CREATININE 1.04 (H) 10/30/2018 1011   CALCIUM 10.4 (H) 10/30/2018 1011   GFRNONAA 60 10/30/2018 1011   GFRAA 69 10/30/2018 1011   Lab Results  Component Value Date   HGBA1C 5.9 (H) 05/04/2019   HGBA1C 5.8 (H) 10/30/2018   HGBA1C 6.3 07/10/2018   HGBA1C 6.2 03/04/2018   HGBA1C 6.1 06/05/2017   Lab Results  Component Value Date   INSULIN 21.2 05/04/2019   INSULIN 25.1 (H) 10/30/2018   INSULIN 38.3 (H) 07/21/2018   CBC    Component Value Date/Time   WBC 9.0 07/21/2018 1143   WBC 8.7 10/16/2017 1140   RBC 4.60 07/21/2018 1143   RBC 4.58 10/16/2017 1140   HGB 12.8 07/21/2018 1143   HCT 39.1  07/21/2018 1143   PLT 325.0 10/16/2017 1140   MCV 85 07/21/2018 1143   MCH 27.8 07/21/2018 1143   MCH 28.0 09/30/2017 0453   MCHC 32.7 07/21/2018 1143   MCHC 32.7 10/16/2017 1140   RDW 14.7  07/21/2018 1143   LYMPHSABS 3.3 (H) 07/21/2018 1143   MONOABS 0.5 10/16/2017 1140   EOSABS 0.2 07/21/2018 1143   BASOSABS 0.1 07/21/2018 1143   Iron/TIBC/Ferritin/ %Sat    Component Value Date/Time   IRON 86 01/07/2015 0902   FERRITIN 19.1 01/07/2015 0902   IRONPCTSAT 23.2 07/30/2008 0856   Lipid Panel     Component Value Date/Time   CHOL 160 10/30/2018 1011   TRIG 87 10/30/2018 1011   TRIG 99 08/30/2006 0904   HDL 63 10/30/2018 1011   CHOLHDL 2 07/10/2018 0819   VLDL 15.4 07/10/2018 0819   LDLCALC 80 10/30/2018 1011   Hepatic Function Panel     Component Value Date/Time   PROT 8.1 10/30/2018 1011   ALBUMIN 5.1 10/30/2018 1011   AST 24 10/30/2018 1011   ALT 20 10/30/2018 1011   ALKPHOS 80 10/30/2018 1011   BILITOT 0.3 10/30/2018 1011   BILIDIR 0.0 05/15/2012 1453      Component Value Date/Time   TSH 3.300 05/04/2019 1006   TSH 4.580 (H) 10/30/2018 1011   TSH 3.130 07/21/2018 1143      OBESITY BEHAVIORAL INTERVENTION VISIT  Today's visit was # 16   Starting weight: 198 lbs Starting date: 07/21/18 Today's weight : Weight: 184 lb (83.5 kg)  Today's date: 06/09/2019 Total lbs lost to date: 14 lbs At least 15 minutes were spent on discussing the following behavioral intervention visit.   ASK: We discussed the diagnosis of obesity with Shakeira Jacquelin Hawking today and Shadee agreed to give [name] permission to discuss obesity behavioral modification therapy today.  ASSESS: Danille has the diagnosis of obesity and her BMI today is 31.57 Amando is in the action stage of change   ADVISE: Catlin was educated on the multiple health risks of obesity as well as the benefit of weight loss to improve her health. She was advised of the need for long term treatment and the importance of lifestyle modifications to improve her current health and to decrease her risk of future health problems.  AGREE: Multiple dietary modification options and treatment options were discussed and  Rebekah [name]  agreed to follow the recommendations documented in the above note.  ARRANGE: Floride was educated on the importance of frequent visits to treat obesity as outlined per CMS and USPSTF guidelines and agreed to schedule her next follow up appointment today.  I, Renee Ramus, am acting as transcriptionist for Ilene Qua, MD   I have reviewed the above documentation for accuracy and completeness, and I agree with the above. - Ilene Qua, MD

## 2019-06-30 ENCOUNTER — Ambulatory Visit (INDEPENDENT_AMBULATORY_CARE_PROVIDER_SITE_OTHER): Payer: 59 | Admitting: Family Medicine

## 2019-07-07 ENCOUNTER — Encounter (INDEPENDENT_AMBULATORY_CARE_PROVIDER_SITE_OTHER): Payer: Self-pay | Admitting: Family Medicine

## 2019-07-07 ENCOUNTER — Other Ambulatory Visit (INDEPENDENT_AMBULATORY_CARE_PROVIDER_SITE_OTHER): Payer: Self-pay | Admitting: Family Medicine

## 2019-07-07 ENCOUNTER — Ambulatory Visit (INDEPENDENT_AMBULATORY_CARE_PROVIDER_SITE_OTHER): Payer: 59 | Admitting: Family Medicine

## 2019-07-07 ENCOUNTER — Other Ambulatory Visit: Payer: Self-pay

## 2019-07-07 VITALS — BP 136/87 | HR 80 | Temp 98.1°F | Ht 64.0 in | Wt 186.0 lb

## 2019-07-07 DIAGNOSIS — Z6832 Body mass index (BMI) 32.0-32.9, adult: Secondary | ICD-10-CM

## 2019-07-07 DIAGNOSIS — Z9189 Other specified personal risk factors, not elsewhere classified: Secondary | ICD-10-CM

## 2019-07-07 DIAGNOSIS — E7849 Other hyperlipidemia: Secondary | ICD-10-CM

## 2019-07-07 DIAGNOSIS — E669 Obesity, unspecified: Secondary | ICD-10-CM | POA: Diagnosis not present

## 2019-07-07 DIAGNOSIS — R7303 Prediabetes: Secondary | ICD-10-CM

## 2019-07-07 MED ORDER — METFORMIN HCL 500 MG PO TABS: 500.0000 mg | ORAL_TABLET | Freq: Every day | ORAL | 0 refills | Status: DC

## 2019-07-08 NOTE — Progress Notes (Signed)
Office: [phone]  /  Fax: [phone]   HPI:   Chief Complaint: OBESITY Rebekah [name] is here to discuss her progress with her obesity treatment plan. She is on the Category 2 plan and is following her eating plan approximately 50 % of the time. She states she is was walking on the beach 1 mile 3 times per week. Rebekah [name] was at the beach again for a week. She has been eating more sweets recently for stress relief. She is planning to go to the mountains at the end of October.  Her weight is 186 lb (84.4 kg) today and has gained 2 lbs since her last visit. She has lost 12 lbs since starting treatment with [name].  Pre-Diabetes Rebekah [name] has a diagnosis of pre-diabetes based on her elevated Hgb A1c and was informed this puts her at greater risk of developing diabetes. She denies GI side effects of metformin and notes significant increase in sweet cravings. She continues to work on diet and exercise to decrease risk of diabetes.   At risk for diabetes Rebekah [name] is at higher than average risk for developing diabetes due to her obesity and pre-diabetes. She currently denies polyuria or polydipsia.  Hyperlipidemia Rebekah [name] has hyperlipidemia and has been trying to improve her cholesterol levels with intensive lifestyle modification including a low saturated fat diet, exercise and weight loss. She is on Crestor and denies any chest pain, claudication or myalgias.  ASSESSMENT AND PLAN:  Other hyperlipidemia  Prediabetes - Plan: metFORMIN (GLUCOPHAGE) 500 MG tablet  At risk for diabetes mellitus  Class 1 obesity with serious comorbidity and body mass index (BMI) of 32.0 to 32.9 in adult, unspecified obesity type  PLAN:  Pre-Diabetes Rebekah [name] will continue to work on weight loss, exercise, and decreasing simple carbohydrates in her diet to help decrease the risk of diabetes. We dicussed metformin including benefits and risks. She was informed that eating too many simple carbohydrates or too many calories at one  sitting increases the likelihood of GI side effects. Rebekah [name] agrees to continue taking metformin 500 mg PO q AM #30 and we will refill for 1 month. Rebekah [name] agrees to follow up with our clinic in 2 weeks as directed to monitor her progress.  Diabetes risk counseling Rebekah [name] was given extended (15 minutes) diabetes prevention counseling today. She is 58 y.o. female and has risk factors for diabetes including obesity and pre-diabetes. We discussed intensive lifestyle modifications today with an emphasis on weight loss as well as increasing exercise and decreasing simple carbohydrates in her diet.  Hyperlipidemia Rebekah [name] was informed of the American Heart Association Guidelines emphasizing intensive lifestyle modifications as the first line treatment for hyperlipidemia. We discussed many lifestyle modifications today in depth, and Rebekah [name] will continue to work on decreasing saturated fats such as fatty red meat, butter and many fried foods. She will also increase vegetables and lean protein in her diet and continue to work on exercise and weight loss efforts. Rebekah [name] agrees to continue taking Crestor, no refill needed. Rebekah [name] agrees to follow up with our clinic in 2 weeks.  Obesity Rebekah [name] is currently in the action stage of change. As such, her goal is to continue with weight loss efforts She has agreed to follow the Category 2 plan Rebekah [name] has been instructed to work up to a goal of 150 minutes of combined cardio and strengthening exercise per week for weight loss and overall health benefits. We discussed the following Behavioral Modification Strategies today: increasing lean protein intake, increasing vegetables and work on  meal planning and easy cooking plans, keeping healthy foods in the home, and planning for success   Rebekah [name] has agreed to follow up with our clinic in 2 weeks. She was informed of the importance of frequent follow up visits to maximize her success with intensive lifestyle modifications for her  multiple health conditions.  ALLERGIES: Allergies  Allergen Reactions  . Penicillins Hives and Rash    Has patient had a PCN reaction causing immediate rash, facial/tongue/throat swelling, SOB or lightheadedness with hypotension: Yes Has patient had a PCN reaction causing severe rash involving mucus membranes or skin necrosis: Yes Has patient had a PCN reaction that required hospitalization: No Has patient had a PCN reaction occurring within the last 10 years: No If all of the above answers are "NO", then may proceed with Cephalosporin use.   . Erythromycin Nausea Only    Gastrointestinal issues.    MEDICATIONS: Current Outpatient Medications on File Prior to Visit  Medication Sig Dispense Refill  . aspirin 81 MG tablet Take 1 tablet (81 mg total) by mouth daily. 90 tablet 3  . Azelaic Acid (FINACEA) 15 % FOAM Apply 1 application topically 2 (two) times daily as needed.     . clonazePAM (KLONOPIN) 0.25 MG disintegrating tablet Take 0.25 mg by mouth 2 (two) times daily.    Marland Kitchen estradiol (CLIMARA - DOSED IN MG/24 HR) 0.025 mg/24hr patch Place 0.025 mg onto the skin once a week.    Marland Kitchen L-Methylfolate-Algae (DEPLIN 7.5) 7.5-90.314 MG CAPS Take 1 capsule by mouth daily.     . Multiple Vitamin (MULTIVITAMIN) tablet Take 1 tablet by mouth daily. 90 tablet 1  . nortriptyline (PAMELOR) 75 MG capsule Take 1 capsule (75 mg total) by mouth daily. 90 capsule 3  . omeprazole (PRILOSEC) 40 MG capsule Take 1 capsule (40 mg total) by mouth daily. 90 capsule 3  . Probiotic Product (PROBIOTIC-10) CHEW Chew 1 tablet by mouth daily.     . progesterone (PROMETRIUM) 100 MG capsule Take 100 mg by mouth daily.    . rosuvastatin (CRESTOR) 10 MG tablet Take 1 tablet (10 mg total) by mouth at bedtime. 90 tablet 0  . Vitamin D, Ergocalciferol, (DRISDOL) 1.25 MG (50000 UT) CAPS capsule Take 1 capsule (50,000 Units total) by mouth every 14 (fourteen) days. 6 capsule 0   No current facility-administered medications on  file prior to visit.     PAST MEDICAL HISTORY: Past Medical History:  Diagnosis Date  . Allergy   . Back pain   . Bowel obstruction ([place]) 2019  . Depression   . Gall bladder stones   . GERD (gastroesophageal reflux disease)   . Hyperlipidemia   . Hypertension   . IBS (irritable bowel syndrome)   . IBS (irritable bowel syndrome)   . Joint pain   . Obesity    Lap band surgery 12-08 Dr Hassell Done  . OSA (obstructive sleep apnea)    using a CPAP    . Pre-diabetes     PAST SURGICAL HISTORY: Past Surgical History:  Procedure Laterality Date  . APPENDECTOMY    . BREAST REDUCTION SURGERY    . CHOLECYSTECTOMY    . CLOSED REDUCTION FINGER WITH PERCUTANEOUS PINNING Left 09/14/2014   Procedure: CLOSED REDUCTION PERCUTANEOUS PINNING ;  Surgeon: Leanora Cover, MD;  Location: [facility];  Service: Orthopedics;  Laterality: Left;  . ESOPHAGEAL MANOMETRY N/A 09/23/2017   Procedure: ESOPHAGEAL MANOMETRY (EM);  Surgeon: Mauri Pole, MD;  Location: [facility];  Service: Endoscopy;  Laterality: N/A;  . Lap Band Adjustment  09/01/2015   Dr. Redmond Pulling  . LAPAROSCOPIC GASTRIC BANDING  09/2007   Lap band surgery 12-08 Dr Hassell Done  . NASAL SINUS SURGERY    . TONSILLECTOMY     x2    SOCIAL HISTORY: Social History   Tobacco Use  . Smoking status: Never Smoker  . Smokeless tobacco: Never Used  Substance Use Topics  . Alcohol use: Yes    Comment: socially   . Drug use: No    FAMILY HISTORY: Family History  Problem Relation Age of Onset  . Coronary artery disease Father        CABG, smoker .age onset 30s  . Prostate cancer Father   . Pulmonary fibrosis Father        and others  . High blood pressure Father   . High Cholesterol Father   . Depression Father   . Obesity Father   . Breast cancer Mother        age 86  . Heart disease Mother        CABG, smoker .age onset 10s  . Diverticulitis Mother   . High blood pressure Mother   . High Cholesterol Mother   .  Thyroid disease Mother   . Depression Mother   . Obesity Mother   . Heart attack Brother        age 57  . Diabetes Neg Hx   . Colon cancer Neg Hx   . Stomach cancer Neg Hx   . Esophageal cancer Neg Hx   . Rectal cancer Neg Hx     ROS: Review of Systems  Constitutional: Negative for weight loss.  Cardiovascular: Negative for chest pain and claudication.  Genitourinary: Negative for frequency.  Musculoskeletal: Negative for myalgias.  Endo/Heme/Allergies: Negative for polydipsia.    PHYSICAL EXAM: Blood pressure 136/87, pulse 80, temperature 98.1 F (36.7 C), temperature source Oral, height 5\' 4"  (1.626 m), weight 186 lb (84.4 kg), SpO2 98 %. Body mass index is 31.93 kg/m. Physical Exam Vitals signs reviewed.  Constitutional:      Appearance: Normal appearance. She is obese.  Cardiovascular:     Rate and Rhythm: Normal rate.     Pulses: Normal pulses.  Pulmonary:     Effort: Pulmonary effort is normal.     Breath sounds: Normal breath sounds.  Musculoskeletal: Normal range of motion.  Skin:    General: Skin is warm and dry.  Neurological:     Mental Status: She is alert and oriented to person, place, and time.  Psychiatric:        Mood and Affect: Mood normal.        Behavior: Behavior normal.     RECENT LABS AND TESTS: BMET    Component Value Date/Time   NA 137 10/30/2018 1011   K 4.9 10/30/2018 1011   CL 97 10/30/2018 1011   CO2 21 10/30/2018 1011   GLUCOSE 86 10/30/2018 1011   GLUCOSE 74 03/04/2018 0852   GLUCOSE 95 08/24/2010 1048   BUN 14 10/30/2018 1011   CREATININE 1.04 (H) 10/30/2018 1011   CALCIUM 10.4 (H) 10/30/2018 1011   GFRNONAA 60 10/30/2018 1011   GFRAA 69 10/30/2018 1011   Lab Results  Component Value Date   HGBA1C 5.9 (H) 05/04/2019   HGBA1C 5.8 (H) 10/30/2018   HGBA1C 6.3 07/10/2018   HGBA1C 6.2 03/04/2018   HGBA1C 6.1 06/05/2017   Lab Results  Component Value Date   INSULIN 21.2 05/04/2019  INSULIN 25.1 (H) 10/30/2018    INSULIN 38.3 (H) 07/21/2018   CBC    Component Value Date/Time   WBC 9.0 07/21/2018 1143   WBC 8.7 10/16/2017 1140   RBC 4.60 07/21/2018 1143   RBC 4.58 10/16/2017 1140   HGB 12.8 07/21/2018 1143   HCT 39.1 07/21/2018 1143   PLT 325.0 10/16/2017 1140   MCV 85 07/21/2018 1143   MCH 27.8 07/21/2018 1143   MCH 28.0 09/30/2017 0453   MCHC 32.7 07/21/2018 1143   MCHC 32.7 10/16/2017 1140   RDW 14.7 07/21/2018 1143   LYMPHSABS 3.3 (H) 07/21/2018 1143   MONOABS 0.5 10/16/2017 1140   EOSABS 0.2 07/21/2018 1143   BASOSABS 0.1 07/21/2018 1143   Iron/TIBC/Ferritin/ %Sat    Component Value Date/Time   IRON 86 01/07/2015 0902   FERRITIN 19.1 01/07/2015 0902   IRONPCTSAT 23.2 07/30/2008 0856   Lipid Panel     Component Value Date/Time   CHOL 160 10/30/2018 1011   TRIG 87 10/30/2018 1011   TRIG 99 08/30/2006 0904   HDL 63 10/30/2018 1011   CHOLHDL 2 07/10/2018 0819   VLDL 15.4 07/10/2018 0819   LDLCALC 80 10/30/2018 1011   Hepatic Function Panel     Component Value Date/Time   PROT 8.1 10/30/2018 1011   ALBUMIN 5.1 10/30/2018 1011   AST 24 10/30/2018 1011   ALT 20 10/30/2018 1011   ALKPHOS 80 10/30/2018 1011   BILITOT 0.3 10/30/2018 1011   BILIDIR 0.0 05/15/2012 1453      Component Value Date/Time   TSH 3.300 05/04/2019 1006   TSH 4.580 (H) 10/30/2018 1011   TSH 3.130 07/21/2018 1143      OBESITY BEHAVIORAL INTERVENTION VISIT  Today's visit was # 17   Starting weight: 198 lbs Starting date: 07/21/18 Today's weight : 186 lbs Today's date: 07/07/2019 Total lbs lost to date: 12    ASK: We discussed the diagnosis of obesity with Rebekah [name] today and Rebekah [name] agreed to give [name] permission to discuss obesity behavioral modification therapy today.  ASSESS: Rebekah [name] has the diagnosis of obesity and her BMI today is 31.91 Rebekah [name] is in the action stage of change   ADVISE: Rebekah [name] was educated on the multiple health risks of obesity as well as the benefit of weight loss  to improve her health. She was advised of the need for long term treatment and the importance of lifestyle modifications to improve her current health and to decrease her risk of future health problems.  AGREE: Multiple dietary modification options and treatment options were discussed and  Rebekah [name] agreed to follow the recommendations documented in the above note.  ARRANGE: Rebekah [name] was educated on the importance of frequent visits to treat obesity as outlined per CMS and USPSTF guidelines and agreed to schedule her next follow up appointment today.  I, Trixie Dredge, am acting as transcriptionist for Ilene Qua, MD  I have reviewed the above documentation for accuracy and completeness, and I agree with the above. - Ilene Qua, MD

## 2019-07-13 ENCOUNTER — Ambulatory Visit (INDEPENDENT_AMBULATORY_CARE_PROVIDER_SITE_OTHER): Payer: 59 | Admitting: Internal Medicine

## 2019-07-13 ENCOUNTER — Encounter: Payer: Self-pay | Admitting: Internal Medicine

## 2019-07-13 ENCOUNTER — Other Ambulatory Visit: Payer: Self-pay

## 2019-07-13 VITALS — BP 141/82 | HR 85 | Temp 98.3°F | Resp 16 | Ht 64.0 in | Wt 192.0 lb

## 2019-07-13 DIAGNOSIS — Z0000 Encounter for general adult medical examination without abnormal findings: Secondary | ICD-10-CM | POA: Diagnosis not present

## 2019-07-13 DIAGNOSIS — Z23 Encounter for immunization: Secondary | ICD-10-CM

## 2019-07-13 LAB — COMPREHENSIVE METABOLIC PANEL
ALT: 23 U/L (ref 0–35)
AST: 24 U/L (ref 0–37)
Albumin: 4.1 g/dL (ref 3.5–5.2)
Alkaline Phosphatase: 60 U/L (ref 39–117)
BUN: 15 mg/dL (ref 6–23)
CO2: 24 mEq/L (ref 19–32)
Calcium: 9.2 mg/dL (ref 8.4–10.5)
Chloride: 104 mEq/L (ref 96–112)
Creatinine, Ser: 0.76 mg/dL (ref 0.40–1.20)
GFR: 78.47 mL/min (ref >60.00)
Glucose, Bld: 77 mg/dL (ref 70–99)
Potassium: 4.1 mEq/L (ref 3.5–5.1)
Sodium: 139 mEq/L (ref 135–145)
Total Bilirubin: 0.3 mg/dL (ref 0.2–1.2)
Total Protein: 6.9 g/dL (ref 6.0–8.3)

## 2019-07-13 LAB — CBC WITH DIFFERENTIAL/PLATELET
Basophils Absolute: 0.1 10*3/uL (ref 0.0–0.1)
Basophils Relative: 0.6 % (ref 0.0–3.0)
Eosinophils Absolute: 0.2 10*3/uL (ref 0.0–0.7)
Eosinophils Relative: 2.6 % (ref 0.0–5.0)
HCT: 37.6 % (ref 36.0–46.0)
Hemoglobin: 12.3 g/dL (ref 12.0–15.0)
Lymphocytes Relative: 33.7 % (ref 12.0–46.0)
Lymphs Abs: 3.2 10*3/uL (ref 0.7–4.0)
MCHC: 32.7 g/dL (ref 30.0–36.0)
MCV: 82.2 fl (ref 78.0–100.0)
Monocytes Absolute: 0.5 10*3/uL (ref 0.1–1.0)
Monocytes Relative: 5.2 % (ref 3.0–12.0)
Neutro Abs: 5.6 10*3/uL (ref 1.4–7.7)
Neutrophils Relative %: 57.9 % (ref 43.0–77.0)
Platelets: 306 10*3/uL (ref 150.0–400.0)
RBC: 4.58 Mil/uL (ref 3.87–5.11)
RDW: 16.9 % — ABNORMAL HIGH (ref 11.5–15.5)
WBC: 9.6 10*3/uL (ref 4.0–10.5)

## 2019-07-13 NOTE — Assessment & Plan Note (Signed)
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

## 2019-07-13 NOTE — Assessment & Plan Note (Addendum)
Here for CPX Chronic medical problems seem stable, continue the same medications, checking labs. RTC 1 year

## 2019-07-13 NOTE — Progress Notes (Signed)
Pre visit review using our clinic review tool, if applicable. No additional management support is needed unless otherwise documented below in the visit note. 

## 2019-07-13 NOTE — Progress Notes (Signed)
Subjective:    Patient ID: Rebekah [name], female    DOB: Jun 06, 1961, 58 y.o.   MRN: [id number]  DOS:  07/13/2019 Type of visit - description: CPX In general feeling well. She lost her father had a difficult time but emotionally is doing better. Working with a wellness clinic to manage her weight,  successfully lost 20 pounds and has gained some back but again she is working on it.   Review of Systems  Other than above, a 14 point review of systems is negative  Past Medical History:  Diagnosis Date  . Allergy   . Back pain   . Bowel obstruction ([place]) 2019  . Depression   . Gall bladder stones   . GERD (gastroesophageal reflux disease)   . Hyperlipidemia   . Hypertension   . IBS (irritable bowel syndrome)   . IBS (irritable bowel syndrome)   . Joint pain   . Obesity    Lap band surgery 12-08 Dr Hassell Done  . OSA (obstructive sleep apnea)    using a CPAP    . Pre-diabetes     Past Surgical History:  Procedure Laterality Date  . APPENDECTOMY    . BREAST REDUCTION SURGERY    . CHOLECYSTECTOMY    . CLOSED REDUCTION FINGER WITH PERCUTANEOUS PINNING Left 09/14/2014   Procedure: CLOSED REDUCTION PERCUTANEOUS PINNING ;  Surgeon: Leanora Cover, MD;  Location: [facility];  Service: Orthopedics;  Laterality: Left;  . ESOPHAGEAL MANOMETRY N/A 09/23/2017   Procedure: ESOPHAGEAL MANOMETRY (EM);  Surgeon: Mauri Pole, MD;  Location: [facility];  Service: Endoscopy;  Laterality: N/A;  . Lap Band Adjustment  09/01/2015   Dr. Redmond Pulling  . LAPAROSCOPIC GASTRIC BANDING  09/2007   Lap band surgery 12-08 Dr Hassell Done  . NASAL SINUS SURGERY    . TONSILLECTOMY     x2    Social History   Socioeconomic History  . Marital status: Married    Spouse name: daniel  . Number of children: 0  . Years of education: Not on file  . Highest education level: Not on file  Occupational History  . Occupation: Airline pilot, works from home  Social Needs  . Financial resource strain: Not on  file  . Food insecurity    Worry: Not on file    Inability: Not on file  . Transportation needs    Medical: Not on file    Non-medical: Not on file  Tobacco Use  . Smoking status: Never Smoker  . Smokeless tobacco: Never Used  Substance and Sexual Activity  . Alcohol use: Yes    Comment: socially   . Drug use: No  . Sexual activity: Yes    Partners: Male    Birth control/protection: None  Lifestyle  . Physical activity    Days per week: Not on file    Minutes per session: Not on file  . Stress: Not on file  Relationships  . Social Herbalist on phone: Not on file    Gets together: Not on file    Attends religious service: Not on file    Active member of club or organization: Not on file    Attends meetings of clubs or organizations: Not on file    Relationship status: Not on file  . Intimate partner violence    Fear of current or ex partner: Not on file    Emotionally abused: Not on file    Physically abused: Not on file  Forced sexual activity: Not on file  Other Topics Concern  . Not on file  Social History Narrative   Lives w/ husband      Allergies as of 07/13/2019      Reactions   Penicillins Hives, Rash   Has patient had a PCN reaction causing immediate rash, facial/tongue/throat swelling, SOB or lightheadedness with hypotension: Yes Has patient had a PCN reaction causing severe rash involving mucus membranes or skin necrosis: Yes Has patient had a PCN reaction that required hospitalization: No Has patient had a PCN reaction occurring within the last 10 years: No If all of the above answers are "NO", then may proceed with Cephalosporin use.   Erythromycin Nausea Only   Gastrointestinal issues.      Medication List       Accurate as of July 13, 2019  8:29 PM. If you have any questions, ask your nurse or doctor.        aspirin 81 MG tablet Take 1 tablet (81 mg total) by mouth daily.   clonazePAM 0.25 MG disintegrating tablet  Commonly known as: KLONOPIN Take 0.25 mg by mouth 2 (two) times daily.   Deplin 7.5 7.5-90.314 MG Caps Take 1 capsule by mouth daily.   estradiol 0.025 mg/24hr patch Commonly known as: CLIMARA - Dosed in mg/24 hr Place 0.025 mg onto the skin once a week.   Finacea 15 % Foam Generic drug: Azelaic Acid Apply 1 application topically 2 (two) times daily as needed.   metFORMIN 500 MG tablet Commonly known as: GLUCOPHAGE Take 1 tablet (500 mg total) by mouth daily with breakfast.   multivitamin tablet Take 1 tablet by mouth daily.   nortriptyline 75 MG capsule Commonly known as: PAMELOR Take 1 capsule (75 mg total) by mouth daily.   omeprazole 40 MG capsule Commonly known as: PRILOSEC Take 1 capsule (40 mg total) by mouth daily.   Probiotic-10 Chew Chew 1 tablet by mouth daily.   progesterone 100 MG capsule Commonly known as: PROMETRIUM Take 100 mg by mouth daily.   rosuvastatin 10 MG tablet Commonly known as: CRESTOR Take 1 tablet (10 mg total) by mouth at bedtime.   Vitamin D (Ergocalciferol) 1.25 MG (50000 UT) Caps capsule Commonly known as: DRISDOL Take 1 capsule (50,000 Units total) by mouth every 14 (fourteen) days.      Family History  Problem Relation Age of Onset  . Coronary artery disease Father        CABG, smoker .age onset 56s  . Prostate cancer Father   . Pulmonary fibrosis Father        and others  . High blood pressure Father   . High Cholesterol Father   . Depression Father   . Obesity Father   . Breast cancer Mother        age 40  . Heart disease Mother        CABG, smoker .age onset 21s  . Diverticulitis Mother   . High blood pressure Mother   . High Cholesterol Mother   . Thyroid disease Mother   . Depression Mother   . Obesity Mother   . Heart attack Brother        age 40  . Diabetes Neg Hx   . Colon cancer Neg Hx   . Stomach cancer Neg Hx   . Esophageal cancer Neg Hx   . Rectal cancer Neg Hx          Objective:    Physical Exam BP Marland Kitchen)  141/82 (BP Location: Left Arm, Patient Position: Sitting, Cuff Size: Normal)   Pulse 85   Temp 98.3 F (36.8 C) (Temporal)   Resp 16   Ht 5\' 4"  (1.626 m)   Wt 192 lb (87.1 kg)   SpO2 99%   BMI 32.96 kg/m  General: Well developed, NAD, BMI noted Neck: No  thyromegaly  HEENT:  Normocephalic . Face symmetric, atraumatic Lungs:  CTA B Normal respiratory effort, no intercostal retractions, no accessory muscle use. Heart: RRR,  no murmur.  No pretibial edema bilaterally  Abdomen:  Not distended, soft, non-tender. No rebound or rigidity.  Lap band noted on the RUQ. Skin: Exposed areas without rash. Not pale. Not jaundice Neurologic:  alert & oriented X3.  Speech normal, gait appropriate for age and unassisted Strength symmetric and appropriate for age.  Psych: Cognition and judgment appear intact.  Cooperative with normal attention span and concentration.  Behavior appropriate. No anxious or depressed appearing.     Assessment      Assessment  Prediabetes HTN Hyperlipidemia: lipitor aches (2018) Anxiety, depression (chronic sx, occ suicidal ideas, h/o self cutting). Dr Casimiro Needle GERD Obesity, lap band surgery 2008 OSA: not using CPAP as off 05-2017 Acne (doxy) ++FH CAD: F, M , B MI age 60, G-parents  SBO 09-2017  PLAN: Here for CPX Chronic medical problems seem stable, continue the same medications, checking labs. RTC 1 year

## 2019-07-13 NOTE — Patient Instructions (Signed)
GO TO THE LAB : Get the blood work     GO TO THE FRONT DESK Schedule your next appointment   for physical exam in 1 year

## 2019-07-15 ENCOUNTER — Other Ambulatory Visit: Payer: Self-pay | Admitting: Internal Medicine

## 2019-08-04 ENCOUNTER — Ambulatory Visit (INDEPENDENT_AMBULATORY_CARE_PROVIDER_SITE_OTHER): Payer: 59 | Admitting: Bariatrics

## 2019-08-28 LAB — HM MAMMOGRAPHY

## 2019-09-03 ENCOUNTER — Encounter: Payer: Self-pay | Admitting: Internal Medicine

## 2019-10-05 ENCOUNTER — Other Ambulatory Visit (INDEPENDENT_AMBULATORY_CARE_PROVIDER_SITE_OTHER): Payer: Self-pay | Admitting: Family Medicine

## 2019-10-05 DIAGNOSIS — R7303 Prediabetes: Secondary | ICD-10-CM

## 2019-10-26 ENCOUNTER — Other Ambulatory Visit: Payer: Self-pay

## 2019-10-26 ENCOUNTER — Encounter (INDEPENDENT_AMBULATORY_CARE_PROVIDER_SITE_OTHER): Payer: Self-pay | Admitting: Family Medicine

## 2019-10-26 ENCOUNTER — Ambulatory Visit (INDEPENDENT_AMBULATORY_CARE_PROVIDER_SITE_OTHER): Payer: 59 | Admitting: Family Medicine

## 2019-10-26 VITALS — BP 162/102 | HR 76 | Temp 97.7°F | Ht 64.0 in | Wt 189.0 lb

## 2019-10-26 DIAGNOSIS — Z9189 Other specified personal risk factors, not elsewhere classified: Secondary | ICD-10-CM

## 2019-10-26 DIAGNOSIS — Z6832 Body mass index (BMI) 32.0-32.9, adult: Secondary | ICD-10-CM

## 2019-10-26 DIAGNOSIS — R7303 Prediabetes: Secondary | ICD-10-CM

## 2019-10-26 DIAGNOSIS — G4733 Obstructive sleep apnea (adult) (pediatric): Secondary | ICD-10-CM | POA: Diagnosis not present

## 2019-10-26 DIAGNOSIS — I10 Essential (primary) hypertension: Secondary | ICD-10-CM | POA: Diagnosis not present

## 2019-10-26 DIAGNOSIS — E7849 Other hyperlipidemia: Secondary | ICD-10-CM

## 2019-10-26 DIAGNOSIS — E669 Obesity, unspecified: Secondary | ICD-10-CM

## 2019-10-26 DIAGNOSIS — G4489 Other headache syndrome: Secondary | ICD-10-CM

## 2019-10-26 MED ORDER — ONDANSETRON HCL 4 MG PO TABS: 4.0000 mg | ORAL_TABLET | Freq: Four times a day (QID) | ORAL | 0 refills | Status: DC

## 2019-10-26 MED ORDER — METFORMIN HCL 500 MG PO TABS: 500.0000 mg | ORAL_TABLET | Freq: Every day | ORAL | 0 refills | Status: DC

## 2019-10-26 MED ORDER — LISINOPRIL 10 MG PO TABS: 10.0000 mg | ORAL_TABLET | Freq: Every day | ORAL | 0 refills | Status: DC

## 2019-10-27 ENCOUNTER — Encounter: Payer: Self-pay | Admitting: Internal Medicine

## 2019-10-27 NOTE — Progress Notes (Signed)
Chief Complaint: OBESITY Rebekah [name] is here to discuss her progress with her obesity treatment plan. Rebekah [name] is on the follow the Category 2 Plan and states she is following her eating plan approximately 10% of the time. Rebekah [name] states she is walking for 10-15 minutes 5 times per week.  Today's visit was #: 32 Starting weight: 198 lbs Starting date: 07/21/2018 Today's weight: 189 Today's date: 10/27/2019 Total lbs lost to date: 9 Total lbs lost since last in-office visit: 0  Subjective:   Interim History: Rebekah [name]'s last visit was in September 2020.  Unfortunately, she has a severe frontal/maxillary headache today.  She took Allegra-D before coming in to try to get some relief.  Her blood pressure is elevated.  She denies vision changes, dizziness, chest pain, shortness of breath, edema.  Complains of nausea and pounding headache.   BP Readings from Last 3 Encounters:  10/26/19 (!) 162/102  07/13/19 (!) 141/82  07/07/19 136/87   Assessment/Plan:     ICD-10-CM   1. Essential hypertension  I10 lisinopril (ZESTRIL) 10 MG tablet  2. Other hyperlipidemia  E78.49   3. Prediabetes  R73.03 metFORMIN (GLUCOPHAGE) 500 MG tablet  4. Obstructive sleep apnea syndrome  G47.33   5. Other headache syndrome  G44.89 ondansetron (ZOFRAN) 4 MG tablet  6. At risk for heart disease  Z91.89   7. Class 1 obesity with serious comorbidity and body mass index (BMI) of 32.0 to 32.9 in adult, unspecified obesity type  E66.9    Z68.32    1. Essential hypertension Rebekah [name]'s blood pressure is elevated today.  She previously took lisinopril.  I am going to restart this today and monitor her blood pressure closely.  Orders -Lisinopril 10 mg once daily, #30, 0 refills.  She agrees to follow-up in our clinic in 1-2 weeks.  2. Other hyperlipidemia  Chronic.  The patient is currently taking Crestor.  Previous labs reviewed.   Lab Results  Component Value Date   CHOL 160 10/30/2018   HDL 63 10/30/2018   LDLCALC 80 10/30/2018   TRIG 87 10/30/2018   CHOLHDL 2 07/10/2018   Lab Results  Component Value Date   ALT 23 07/13/2019   AST 24 07/13/2019   ALKPHOS 60 07/13/2019   BILITOT 0.3 07/13/2019   The 10-year ASCVD risk score Mikey Bussing DC Jr., et al., 2013) is: 3.8%   Values used to calculate the score:     Age: 59 years     Sex: Female     Is Non-Hispanic African American: No     Diabetic: No     Tobacco smoker: No     Systolic Blood Pressure: 0000000 mmHg     Is BP treated: Yes     HDL Cholesterol: 63 mg/dL     Total Cholesterol: 160 mg/dL  3. Prediabetes Rebekah [name]'s last A1c was 5.9 on 05/04/2019.  Will continue to monitor.  I will refill her metformin today.  Orders -Metformin 500 mg one tablet daily, 30 tablets, 0 refills.  4. Obstructive sleep apnea syndrome The patient states she is not compliant with her CPAP.  I encouraged greater than 4 hours of use per night.  5. Other headache syndrome The patient has a history of migraines usually related to hormone changes.  She is off [place] and is a nonsmoker.    Orders -Zofran ODT 4 mg every 6 hours as needed for nausea, #12, 0 refills.  She agrees to follow-up with our clinic in 1-2 weeks.  6. At risk for heart disease Rebekah [name] was given (~15 minutes) coronary artery disease prevention counseling today. She is 59 y.o. female and has risk factors for heart disease including obesity. We discussed intensive lifestyle modifications today with an emphasis on specific weight loss instructions and strategies.   7. Class 1 obesity with serious comorbidity and body mass index (BMI) of 32.0 to 32.9 in adult, unspecified obesity type Rebekah [name] is currently in the action stage of change. As such, her goal is to continue with weight loss efforts. She has agreed to follow the Category 2 Plan. We discussed the following exercise goals today: For substantial health benefits, adults should do at least 150 minutes (2 hours and 30 minutes) a week of  moderate-intensity, or 75 minutes (1 hour and 15 minutes) a week of vigorous-intensity aerobic physical activity, or an equivalent combination of moderate- and vigorous-intensity aerobic activity. Aerobic activity should be performed in episodes of at least 10 minutes, and preferably, it should be spread throughout the week. Adults should also include muscle-strengthening activities that involve all major muscle groups on 2 or more days a week.Isaiah Serge has agreed to follow-up with our clinic in 2 weeks. She was informed of the importance of frequent follow-up visits to maximize her success with intensive lifestyle modifications for her multiple health conditions.  Objective:   Blood pressure (!) 162/102, pulse 76, temperature 97.7 F (36.5 C), temperature source Oral, height 5\' 4"  (1.626 m), weight 189 lb (85.7 kg), SpO2 97 %. Body mass index is 32.44 kg/m.  General: Cooperative, alert, well developed, in no acute distress. HEENT: Conjunctivae and lids unremarkable. Neck: No thyromegaly.  Cardiovascular: Regular rhythm.  Lungs: Normal work of breathing. Extremities: No edema.  Neurologic: No focal deficits.   Lab Results  Component Value Date   CREATININE 0.76 07/13/2019   BUN 15 07/13/2019   NA 139 07/13/2019   K 4.1 07/13/2019   CL 104 07/13/2019   CO2 24 07/13/2019   Lab Results  Component Value Date   ALT 23 07/13/2019   AST 24 07/13/2019   ALKPHOS 60 07/13/2019   BILITOT 0.3 07/13/2019   Lab Results  Component Value Date   HGBA1C 5.9 (H) 05/04/2019   HGBA1C 5.8 (H) 10/30/2018   HGBA1C 6.3 07/10/2018   HGBA1C 6.2 03/04/2018   HGBA1C 6.1 06/05/2017   Lab Results  Component Value Date   INSULIN 21.2 05/04/2019   INSULIN 25.1 (H) 10/30/2018   INSULIN 38.3 (H) 07/21/2018   Lab Results  Component Value Date   TSH 3.300 05/04/2019   Lab Results  Component Value Date   CHOL 160 10/30/2018   HDL 63 10/30/2018   LDLCALC 80 10/30/2018   TRIG 87 10/30/2018     CHOLHDL 2 07/10/2018   Lab Results  Component Value Date   WBC 9.6 07/13/2019   HGB 12.3 07/13/2019   HCT 37.6 07/13/2019   MCV 82.2 07/13/2019   PLT 306.0 07/13/2019   Lab Results  Component Value Date   IRON 86 01/07/2015   FERRITIN 19.1 01/07/2015   Attestation Statements:   Reviewed by clinician on day of visit: allergies, medications, problem list, medical history, surgical history, family history, social history and previous encounter notes.  This visit occurred during the SARS-CoV-2 public health emergency. Safety protocols were in place, including screening questions prior to the visit, additional usage of staff PPE, and extensive cleaning of exam room while observing appropriate contact time as indicated for disinfecting solutions. (CPT  99072)  I, Water quality scientist, am acting as Location manager for [employer], DO.  I have reviewed the above documentation for accuracy and completeness, and I agree with the above. Briscoe Deutscher, DO

## 2019-11-05 DIAGNOSIS — M25561 Pain in right knee: Secondary | ICD-10-CM | POA: Insufficient documentation

## 2019-11-09 ENCOUNTER — Ambulatory Visit (INDEPENDENT_AMBULATORY_CARE_PROVIDER_SITE_OTHER): Payer: 59 | Admitting: Family Medicine

## 2019-11-09 ENCOUNTER — Encounter (INDEPENDENT_AMBULATORY_CARE_PROVIDER_SITE_OTHER): Payer: Self-pay | Admitting: Family Medicine

## 2019-11-09 ENCOUNTER — Other Ambulatory Visit: Payer: Self-pay

## 2019-11-09 VITALS — BP 127/85 | HR 76 | Temp 98.2°F | Ht 64.0 in | Wt 189.0 lb

## 2019-11-09 DIAGNOSIS — R7303 Prediabetes: Secondary | ICD-10-CM | POA: Diagnosis not present

## 2019-11-09 DIAGNOSIS — E669 Obesity, unspecified: Secondary | ICD-10-CM

## 2019-11-09 DIAGNOSIS — R5383 Other fatigue: Secondary | ICD-10-CM

## 2019-11-09 DIAGNOSIS — I10 Essential (primary) hypertension: Secondary | ICD-10-CM | POA: Diagnosis not present

## 2019-11-09 DIAGNOSIS — G4733 Obstructive sleep apnea (adult) (pediatric): Secondary | ICD-10-CM | POA: Diagnosis not present

## 2019-11-09 DIAGNOSIS — Z9189 Other specified personal risk factors, not elsewhere classified: Secondary | ICD-10-CM

## 2019-11-09 DIAGNOSIS — E66811 Obesity, class 1: Secondary | ICD-10-CM

## 2019-11-09 DIAGNOSIS — Z6832 Body mass index (BMI) 32.0-32.9, adult: Secondary | ICD-10-CM

## 2019-11-09 DIAGNOSIS — E7849 Other hyperlipidemia: Secondary | ICD-10-CM

## 2019-11-09 MED ORDER — LISINOPRIL 10 MG PO TABS: 10.0000 mg | ORAL_TABLET | Freq: Every day | ORAL | 0 refills | Status: DC

## 2019-11-09 MED ORDER — METFORMIN HCL 500 MG PO TABS: 500.0000 mg | ORAL_TABLET | Freq: Every day | ORAL | 0 refills | Status: DC

## 2019-11-09 NOTE — Progress Notes (Signed)
Chief Complaint:   OBESITY Rebekah [name] is here to discuss her progress with her obesity treatment plan along with follow-up of her obesity related diagnoses. Rebekah [name] is on the Category 2 Plan and states she is following her eating plan approximately 50% of the time. Rebekah [name] states she is exercising for 0 minutes 0 times per week.  Today's visit was #: 20 Starting weight: 198 lbs Starting date: 07/21/2018 Today's weight: 189 lbs Today's date: 11/09/2019 Total lbs lost to date: 9 lbs Total lbs lost since last in-office visit: 0  Interim History: Rebekah [name] says she is feeling much better.  She came to her last appointment with a headache and increased blood pressure and we restarted lisinopril.  She is due for labs today.  Subjective:   1. Essential hypertension Review: taking medications as instructed, no medication side effects noted, no chest pain on exertion, no dyspnea on exertion, no swelling of ankles. She is taking lisinopril 10 mg daily.  BP Readings from Last 3 Encounters:  11/09/19 127/85  10/26/19 (!) 162/102  07/13/19 (!) 141/82   2. Other hyperlipidemia Rebekah [name] has hyperlipidemia and has been trying to improve her cholesterol levels with intensive lifestyle modification including a low saturated fat diet, exercise and weight loss. She denies any chest pain, claudication or myalgias. Taking Crestor.  Lab Results  Component Value Date   ALT 23 07/13/2019   AST 24 07/13/2019   ALKPHOS 60 07/13/2019   BILITOT 0.3 07/13/2019   Lab Results  Component Value Date   CHOL 160 10/30/2018   HDL 63 10/30/2018   LDLCALC 80 10/30/2018   TRIG 87 10/30/2018   CHOLHDL 2 07/10/2018   3. Prediabetes Rebekah [name] has a diagnosis of prediabetes based on her elevated HgA1c and was informed this puts her at greater risk of developing diabetes. She continues to work on diet and exercise to decrease her risk of diabetes. She denies nausea or hypoglycemia.  Taking metformin 500 mg daily.  Lab Results    Component Value Date   HGBA1C 5.9 (H) 05/04/2019   Lab Results  Component Value Date   INSULIN 21.2 05/04/2019   INSULIN 25.1 (H) 10/30/2018   INSULIN 38.3 (H) 07/21/2018   4. OSA (obstructive sleep apnea) Rebekah [name] has a diagnosis of sleep apnea. she reports that she is not is using a CPAP regularly.   5. At risk for heart disease Rebekah [name] is at a higher than average risk for cardiovascular disease due to obesity. Reviewed: no chest pain on exertion, no dyspnea on exertion, and no swelling of ankles.  The ASCVD Risk score Rebekah Bussing DC Jr., et al., 2013) failed to calculate for the following reasons:   The valid total cholesterol range is 130 to 320 mg/dL  Assessment/Plan:   1. Essential hypertension Sydna is working on healthy weight loss and exercise to improve blood pressure control. We will watch for signs of hypotension as she continues her lifestyle modifications.  Orders - CBC with Differential/Platelet - Hemoglobin A1c - VITAMIN D 25 Hydroxy (Vit-D Deficiency, Fractures) - T3 - T4, free - TSH - lisinopril (ZESTRIL) 10 MG tablet; Take 1 tablet (10 mg total) by mouth daily.  Dispense: 90 tablet; Refill: 0  2. Other hyperlipidemia Cardiovascular risk and specific lipid/LDL goals reviewed.  We discussed several lifestyle modifications today and Rebekah [name] will continue to work on diet, exercise and weight loss efforts. Orders and follow up as documented in patient record.   Counseling Intensive lifestyle modifications are the first line treatment  for this issue. . Dietary changes: Increase soluble fiber. Decrease simple carbohydrates. . Exercise changes: Moderate to vigorous-intensity aerobic activity 150 minutes per week if tolerated. . Lipid-lowering medications: see documented in medical record.  Orders - Lipid Panel With LDL/HDL Ratio  3. Prediabetes Rebekah [name] will continue to work on weight loss, exercise, and decreasing simple carbohydrates to help decrease the risk of diabetes.    Orders - Comprehensive metabolic panel - metFORMIN (GLUCOPHAGE) 500 MG tablet; Take 1 tablet (500 mg total) by mouth daily with breakfast.  Dispense: 90 tablet; Refill: 0  4. OSA (obstructive sleep apnea) Intensive lifestyle modifications are the first line treatment for this issue. We discussed several lifestyle modifications today and she will continue to work on diet, exercise and weight loss efforts. We will continue to monitor. Orders and follow up as documented in patient record.   Counseling  Sleep apnea is a condition in which breathing pauses or becomes shallow during sleep. This happens over and over during the night. This disrupts your sleep and keeps your body from getting the rest that it needs, which can cause tiredness and lack of energy (fatigue) during the day.  Sleep apnea treatment: If you were given a device to open your airway while you sleep, USE IT!  Sleep hygiene:   Limit or avoid alcohol, caffeinated beverages, and cigarettes, especially close to bedtime.   Do not eat a large meal or eat spicy foods right before bedtime. This can lead to digestive discomfort that can make it hard for you to sleep.  Keep a sleep diary to help you and your health care provider figure out what could be causing your insomnia.  . Make your bedroom a dark, comfortable place where it is easy to fall asleep. ? Put up shades or blackout curtains to block light from outside. ? Use a white noise machine to block noise. ? Keep the temperature cool. . Limit screen use before bedtime. This includes: ? Watching TV. ? Using your smartphone, tablet, or computer. . Stick to a routine that includes going to bed and waking up at the same times every day and night. This can help you fall asleep faster. Consider making a quiet activity, such as reading, part of your nighttime routine. . Try to avoid taking naps during the day so that you sleep better at night. . Get out of bed if you are still  awake after 15 minutes of trying to sleep. Keep the lights down, but try reading or doing a quiet activity. When you feel sleepy, go back to bed.  5. At risk for heart disease Rebekah [name] was given approximately 15 minutes of coronary artery disease prevention counseling today. She is 59 y.o. female and has risk factors for heart disease including obesity. We discussed intensive lifestyle modifications today with an emphasis on specific weight loss instructions and strategies.   Repetitive spaced learning was employed today to elicit superior memory formation and behavioral change.  6. Class 1 obesity with serious comorbidity and body mass index (BMI) of 32.0 to 32.9 in adult, unspecified obesity type Rebekah [name] is currently in the action stage of change. As such, her goal is to continue with weight loss efforts. She has agreed to the Category 2 Plan and 400-500 calories and 35+ grams of protein at supper.   Exercise goals: For substantial health benefits, adults should do at least 150 minutes (2 hours and 30 minutes) a week of moderate-intensity, or 75 minutes (1 hour and 15 minutes) a  week of vigorous-intensity aerobic physical activity, or an equivalent combination of moderate- and vigorous-intensity aerobic activity. Aerobic activity should be performed in episodes of at least 10 minutes, and preferably, it should be spread throughout the week. Adults should also include muscle-strengthening activities that involve all major muscle groups on 2 or more days a week.  Behavioral modification strategies: increasing lean protein intake, decreasing simple carbohydrates, increasing vegetables and increasing water intake.  She should get an air fryer and increase her water intake.  Rebekah [name] has agreed to follow-up with our clinic in 2 weeks. She was informed of the importance of frequent follow-up visits to maximize her success with intensive lifestyle modifications for her multiple health conditions.   Rebekah [name] was  informed we would discuss her lab results at her next visit unless there is a critical issue that needs to be addressed sooner. Rebekah [name] agreed to keep her next visit at the agreed upon time to discuss these results.  Objective:   Blood pressure 127/85, pulse 76, temperature 98.2 F (36.8 C), temperature source Oral, height 5\' 4"  (1.626 m), weight 189 lb (85.7 kg), SpO2 99 %. Body mass index is 32.44 kg/m.  General: Cooperative, alert, well developed, in no acute distress. HEENT: Conjunctivae and lids unremarkable. Cardiovascular: Regular rhythm.  Lungs: Normal work of breathing. Neurologic: No focal deficits.   Lab Results  Component Value Date   CREATININE 0.76 07/13/2019   BUN 15 07/13/2019   NA 139 07/13/2019   K 4.1 07/13/2019   CL 104 07/13/2019   CO2 24 07/13/2019   Lab Results  Component Value Date   ALT 23 07/13/2019   AST 24 07/13/2019   ALKPHOS 60 07/13/2019   BILITOT 0.3 07/13/2019   Lab Results  Component Value Date   HGBA1C 5.9 (H) 05/04/2019   HGBA1C 5.8 (H) 10/30/2018   HGBA1C 6.3 07/10/2018   HGBA1C 6.2 03/04/2018   HGBA1C 6.1 06/05/2017   Lab Results  Component Value Date   INSULIN 21.2 05/04/2019   INSULIN 25.1 (H) 10/30/2018   INSULIN 38.3 (H) 07/21/2018   Lab Results  Component Value Date   TSH 3.300 05/04/2019   Lab Results  Component Value Date   CHOL 160 10/30/2018   HDL 63 10/30/2018   LDLCALC 80 10/30/2018   TRIG 87 10/30/2018   CHOLHDL 2 07/10/2018   Lab Results  Component Value Date   WBC 9.6 07/13/2019   HGB 12.3 07/13/2019   HCT 37.6 07/13/2019   MCV 82.2 07/13/2019   PLT 306.0 07/13/2019   Lab Results  Component Value Date   IRON 86 01/07/2015   FERRITIN 19.1 01/07/2015   Attestation Statements:   Reviewed by clinician on day of visit: allergies, medications, problem list, medical history, surgical history, family history, social history, and previous encounter notes.  I, Water quality scientist, CMA, am acting as  Location manager for [employer], DO.  I have reviewed the above documentation for accuracy and completeness, and I agree with the above. Briscoe Deutscher, DO

## 2019-11-10 ENCOUNTER — Encounter (INDEPENDENT_AMBULATORY_CARE_PROVIDER_SITE_OTHER): Payer: Self-pay | Admitting: Family Medicine

## 2019-11-10 LAB — CBC WITH DIFFERENTIAL/PLATELET
Basophils Absolute: 0.1 10*3/uL (ref 0.0–0.2)
Basos: 1 %
EOS (ABSOLUTE): 0.2 10*3/uL (ref 0.0–0.4)
Eos: 2 %
Hematocrit: 39.7 % (ref 34.0–46.6)
Hemoglobin: 12.9 g/dL (ref 11.1–15.9)
Immature Grans (Abs): 0 10*3/uL (ref 0.0–0.1)
Immature Granulocytes: 0 %
Lymphocytes Absolute: 3.2 10*3/uL — ABNORMAL HIGH (ref 0.7–3.1)
Lymphs: 32 %
MCH: 26.8 pg (ref 26.6–33.0)
MCHC: 32.5 g/dL (ref 31.5–35.7)
MCV: 83 fL (ref 79–97)
Monocytes Absolute: 0.5 10*3/uL (ref 0.1–0.9)
Monocytes: 4 %
Neutrophils Absolute: 6.2 10*3/uL (ref 1.4–7.0)
Neutrophils: 61 %
Platelets: 355 10*3/uL (ref 150–450)
RBC: 4.81 x10E6/uL (ref 3.77–5.28)
RDW: 15.6 % — ABNORMAL HIGH (ref 11.7–15.4)
WBC: 10.2 10*3/uL (ref 3.4–10.8)

## 2019-11-10 LAB — COMPREHENSIVE METABOLIC PANEL
ALT: 22 IU/L (ref 0–32)
AST: 25 IU/L (ref 0–40)
Albumin/Globulin Ratio: 1.6 (ref 1.2–2.2)
Albumin: 4.7 g/dL (ref 3.8–4.9)
Alkaline Phosphatase: 94 IU/L (ref 39–117)
BUN/Creatinine Ratio: 15 (ref 9–23)
BUN: 12 mg/dL (ref 6–24)
Bilirubin Total: 0.2 mg/dL (ref 0.0–1.2)
CO2: 24 mmol/L (ref 20–29)
Calcium: 9.6 mg/dL (ref 8.7–10.2)
Chloride: 101 mmol/L (ref 96–106)
Creatinine, Ser: 0.81 mg/dL (ref 0.57–1.00)
GFR calc Af Amer: 93 mL/min/{1.73_m2} (ref >59)
GFR calc non Af Amer: 81 mL/min/{1.73_m2} (ref >59)
Globulin, Total: 3 g/dL (ref 1.5–4.5)
Glucose: 66 mg/dL (ref 65–99)
Potassium: 4.5 mmol/L (ref 3.5–5.2)
Sodium: 139 mmol/L (ref 134–144)
Total Protein: 7.7 g/dL (ref 6.0–8.5)

## 2019-11-10 LAB — T3: T3, Total: 120 ng/dL (ref 71–180)

## 2019-11-10 LAB — TSH: TSH: 3.32 u[IU]/mL (ref 0.450–4.500)

## 2019-11-10 LAB — LIPID PANEL WITH LDL/HDL RATIO
Cholesterol, Total: 129 mg/dL (ref 100–199)
HDL: 54 mg/dL (ref >39)
LDL Chol Calc (NIH): 63 mg/dL (ref 0–99)
LDL/HDL Ratio: 1.2 ratio (ref 0.0–3.2)
Triglycerides: 56 mg/dL (ref 0–149)
VLDL Cholesterol Cal: 12 mg/dL (ref 5–40)

## 2019-11-10 LAB — HEMOGLOBIN A1C
Est. average glucose Bld gHb Est-mCnc: 126 mg/dL
Hgb A1c MFr Bld: 6 % — ABNORMAL HIGH (ref 4.8–5.6)

## 2019-11-10 LAB — VITAMIN D 25 HYDROXY (VIT D DEFICIENCY, FRACTURES): Vit D, 25-Hydroxy: 34.5 ng/mL (ref 30.0–100.0)

## 2019-11-10 LAB — VITAMIN B12: Vitamin B-12: 931 pg/mL (ref 232–1245)

## 2019-11-10 LAB — T4, FREE: Free T4: 0.96 ng/dL (ref 0.82–1.77)

## 2019-11-13 ENCOUNTER — Encounter: Payer: Self-pay | Admitting: Internal Medicine

## 2019-11-13 ENCOUNTER — Other Ambulatory Visit: Payer: Self-pay

## 2019-11-13 ENCOUNTER — Ambulatory Visit: Payer: 59 | Admitting: Internal Medicine

## 2019-11-13 VITALS — BP 116/72 | HR 83 | Temp 97.8°F | Resp 16 | Ht 64.0 in | Wt 194.5 lb

## 2019-11-13 DIAGNOSIS — I10 Essential (primary) hypertension: Secondary | ICD-10-CM | POA: Diagnosis not present

## 2019-11-13 DIAGNOSIS — G43909 Migraine, unspecified, not intractable, without status migrainosus: Secondary | ICD-10-CM | POA: Diagnosis not present

## 2019-11-13 NOTE — Patient Instructions (Signed)
   GO TO THE FRONT DESK Schedule your next appointment for a physical exam by 06/2020      Check the  blood pressure 2 or 3 times a month  BP GOAL is between 110/65 and  135/85. If it is consistently higher or lower, let me know

## 2019-11-13 NOTE — Progress Notes (Signed)
Pre visit review using our clinic review tool, if applicable. No additional management support is needed unless otherwise documented below in the visit note. 

## 2019-11-13 NOTE — Progress Notes (Signed)
Subjective:    Patient ID: Rebekah [name], female    DOB: [birth year], 59 y.o.   MRN: [id number]  DOS:  11/13/2019 Type of visit - description: Acute Few days ago she call with a headache and an elevated BP.  She has a history of migraines, was asymptomatic for 9 years until few days ago. The headache was intense but not the worst of her life. No photophobia but some phonophobia.  + Nausea.  Her BP was elevated.  Since then she is back to normal, BP was checked at another office and it was 127/85.   Wt Readings from Last 3 Encounters:  11/13/19 194 lb 8 oz (88.2 kg)  11/09/19 189 lb (85.7 kg)  10/26/19 189 lb (85.7 kg)        Review of Systems See above   Past Medical History:  Diagnosis Date  . Allergy   . Back pain   . Bowel obstruction ([place]) 2019  . Depression   . Gall bladder stones   . GERD (gastroesophageal reflux disease)   . Hyperlipidemia   . Hypertension   . IBS (irritable bowel syndrome)   . IBS (irritable bowel syndrome)   . Joint pain   . Obesity    Lap band surgery 12-08 Dr Hassell Done  . OSA (obstructive sleep apnea)    using a CPAP    . Pre-diabetes     Past Surgical History:  Procedure Laterality Date  . APPENDECTOMY    . BREAST REDUCTION SURGERY    . CHOLECYSTECTOMY    . CLOSED REDUCTION FINGER WITH PERCUTANEOUS PINNING Left 09/14/2014   Procedure: CLOSED REDUCTION PERCUTANEOUS PINNING ;  Surgeon: Leanora Cover, MD;  Location: [facility];  Service: Orthopedics;  Laterality: Left;  . ESOPHAGEAL MANOMETRY N/A 09/23/2017   Procedure: ESOPHAGEAL MANOMETRY (EM);  Surgeon: Mauri Pole, MD;  Location: [facility];  Service: Endoscopy;  Laterality: N/A;  . Lap Band Adjustment  09/01/2015   Dr. Redmond Pulling  . LAPAROSCOPIC GASTRIC BANDING  09/2007   Lap band surgery 12-08 Dr Hassell Done  . NASAL SINUS SURGERY    . TONSILLECTOMY     x2        Objective:   Physical Exam BP 116/72 (BP Location: Left Arm, Patient Position: Sitting, Cuff  Size: Normal)   Pulse 83   Temp 97.8 F (36.6 C) (Temporal)   Resp 16   Ht 5\' 4"  (1.626 m)   Wt 194 lb 8 oz (88.2 kg)   SpO2 99%   BMI 33.39 kg/m  General:   Well developed, NAD, BMI noted. HEENT:  Normocephalic . Face symmetric, atraumatic Neck: Full range of motion Lungs:  CTA B Normal respiratory effort, no intercostal retractions, no accessory muscle use. Heart: RRR,  no murmur.  No pretibial edema bilaterally  Skin: Not pale. Not jaundice Neurologic:  alert & oriented X3.  Speech normal, gait appropriate for age and unassisted. Speech normal, EOMI. Psych--  Cognition and judgment appear intact.  Cooperative with normal attention span and concentration.  Behavior appropriate. No anxious or depressed appearing.      Assessment      Assessment  Prediabetes HTN Hyperlipidemia: lipitor aches (2018) Anxiety, depression (chronic sx, occ suicidal ideas, h/o self cutting). Dr Casimiro Needle GERD Migraines  Obesity, lap band surgery 2008 OSA: not using CPAP as off 05-2017 Acne (doxy) ++FH CAD: F, M , B MI age 54, G-parents  SBO 09-2017  PLAN: Migraine headache: Recent headache likely migraine, it is the  first headache she has in 9 years, features were classic for her migraines, no further evaluation needed at this time HTN: BP was elevated when she had a headache, subsequently was checked at another office and  here today.  BP is now normal. Continue lisinopril.  Last BMP satisfactory High cholesterol: On Crestor, last FLP satisfactory RTC 9-20 21 CPX.   This visit occurred during the SARS-CoV-2 public health emergency.  Safety protocols were in place, including screening questions prior to the visit, additional usage of staff PPE, and extensive cleaning of exam room while observing appropriate contact time as indicated for disinfecting solutions.

## 2019-11-14 NOTE — Assessment & Plan Note (Signed)
Migraine headache: Recent headache likely migraine, it is the first headache she has in 9 years, features were classic for her migraines, no further evaluation needed at this time HTN: BP was elevated when she had a headache, subsequently was checked at another office and  here today.  BP is now normal. Continue lisinopril.  Last BMP satisfactory High cholesterol: On Crestor, last FLP satisfactory RTC 9-20 21 CPX.

## 2019-11-16 ENCOUNTER — Other Ambulatory Visit: Payer: Self-pay | Admitting: Internal Medicine

## 2019-11-23 ENCOUNTER — Encounter (INDEPENDENT_AMBULATORY_CARE_PROVIDER_SITE_OTHER): Payer: Self-pay | Admitting: Family Medicine

## 2019-11-23 ENCOUNTER — Other Ambulatory Visit: Payer: Self-pay

## 2019-11-23 ENCOUNTER — Ambulatory Visit (INDEPENDENT_AMBULATORY_CARE_PROVIDER_SITE_OTHER): Payer: 59 | Admitting: Family Medicine

## 2019-11-23 VITALS — BP 139/84 | HR 76 | Temp 97.9°F | Ht 64.0 in | Wt 188.0 lb

## 2019-11-23 DIAGNOSIS — E559 Vitamin D deficiency, unspecified: Secondary | ICD-10-CM

## 2019-11-23 DIAGNOSIS — Z9189 Other specified personal risk factors, not elsewhere classified: Secondary | ICD-10-CM

## 2019-11-23 DIAGNOSIS — I10 Essential (primary) hypertension: Secondary | ICD-10-CM

## 2019-11-23 DIAGNOSIS — E669 Obesity, unspecified: Secondary | ICD-10-CM

## 2019-11-23 DIAGNOSIS — E7849 Other hyperlipidemia: Secondary | ICD-10-CM | POA: Diagnosis not present

## 2019-11-23 DIAGNOSIS — R7303 Prediabetes: Secondary | ICD-10-CM | POA: Diagnosis not present

## 2019-11-23 DIAGNOSIS — Z6832 Body mass index (BMI) 32.0-32.9, adult: Secondary | ICD-10-CM

## 2019-11-23 DIAGNOSIS — G4733 Obstructive sleep apnea (adult) (pediatric): Secondary | ICD-10-CM | POA: Diagnosis not present

## 2019-11-23 MED ORDER — VITAMIN D (ERGOCALCIFEROL) 1.25 MG (50000 UNIT) PO CAPS: 50000.0000 [IU] | ORAL_CAPSULE | ORAL | 0 refills | Status: DC

## 2019-11-23 NOTE — Progress Notes (Signed)
Chief Complaint:   OBESITY Rebekah [name] is here to discuss her progress with her obesity treatment plan along with follow-up of her obesity related diagnoses. Rebekah [name] is on the Category 2 Plan and states she is following her eating plan approximately 65% of the time. Rebekah [name] states she is exercising for 0 minutes 0 times per week.  Today's visit was #: 21 Starting weight: 198 lbs Starting date: 07/21/2018 Today's weight: 188 lbs Today's date: 11/23/2019 Total lbs lost to date: 10 lbs Total lbs lost since last in-office visit: 1 lb  Interim History: Rebekah [name] says she had M&Ms in her house.  They are now gone.  She understands that she needs to be in the mindset to be on the plan.  Subjective:   1. Essential hypertension Review: taking medications as instructed, no medication side effects noted, no chest pain on exertion, no dyspnea on exertion, no swelling of ankles.  She is taking lisinopril for blood pressure control.  BP Readings from Last 3 Encounters:  11/23/19 139/84  11/13/19 116/72  11/09/19 127/85   2. Other hyperlipidemia Rebekah [name] has hyperlipidemia and has been trying to improve her cholesterol levels with intensive lifestyle modification including a low saturated fat diet, exercise and weight loss. She denies any chest pain, claudication or myalgias.  She is taking Crestor.  Lab Results  Component Value Date   ALT 22 11/09/2019   AST 25 11/09/2019   ALKPHOS 94 11/09/2019   BILITOT 0.2 11/09/2019   Lab Results  Component Value Date   CHOL 129 11/09/2019   HDL 54 11/09/2019   LDLCALC 63 11/09/2019   TRIG 56 11/09/2019   CHOLHDL 2 07/10/2018   3. Prediabetes Rebekah [name] has a diagnosis of prediabetes based on her elevated HgA1c and was informed this puts her at greater risk of developing diabetes. She continues to work on diet and exercise to decrease her risk of diabetes. She denies nausea or hypoglycemia.  She is currently taking metformin 500 mg once daily and will consider  Saxenda.  She will check to see if it is covered by her insurance.  Lab Results  Component Value Date   HGBA1C 6.0 (H) 11/09/2019   Lab Results  Component Value Date   INSULIN 21.2 05/04/2019   INSULIN 25.1 (H) 10/30/2018   INSULIN 38.3 (H) 07/21/2018   4. OSA (obstructive sleep apnea) Rebekah [name] has a diagnosis of sleep apnea. She reports that she is not using a CPAP regularly.   5. Vitamin D deficiency Rebekah [name]'s Vitamin D level was 34.5 on 11/09/2019. She is currently taking vit D. She denies nausea, vomiting or muscle weakness.  6. At risk for diabetes mellitus Rebekah [name] is at higher than average risk for developing diabetes due to her obesity.   Assessment/Plan:   1. Essential hypertension Rebekah [name] is working on healthy weight loss and exercise to improve blood pressure control. We will watch for signs of hypotension as she continues her lifestyle modifications.  2. Other hyperlipidemia Cardiovascular risk and specific lipid/LDL goals reviewed.  We discussed several lifestyle modifications today and Rebekah [name] will continue to work on diet, exercise and weight loss efforts. Orders and follow up as documented in patient record.   Counseling Intensive lifestyle modifications are the first line treatment for this issue. . Dietary changes: Increase soluble fiber. Decrease simple carbohydrates. . Exercise changes: Moderate to vigorous-intensity aerobic activity 150 minutes per week if tolerated. . Lipid-lowering medications: see documented in medical record.  3. Prediabetes Rebekah [name] will continue to work on  weight loss, exercise, and decreasing simple carbohydrates to help decrease the risk of diabetes.   4. OSA (obstructive sleep apnea) Intensive lifestyle modifications are the first line treatment for this issue. We discussed several lifestyle modifications today and she will continue to work on diet, exercise and weight loss efforts. We will continue to monitor. Orders and follow up as documented  in patient record.   Counseling  Sleep apnea is a condition in which breathing pauses or becomes shallow during sleep. This happens over and over during the night. This disrupts your sleep and keeps your body from getting the rest that it needs, which can cause tiredness and lack of energy (fatigue) during the day.  Sleep apnea treatment: If you were given a device to open your airway while you sleep, USE IT!  Sleep hygiene:   Limit or avoid alcohol, caffeinated beverages, and cigarettes, especially close to bedtime.   Do not eat a large meal or eat spicy foods right before bedtime. This can lead to digestive discomfort that can make it hard for you to sleep.  Keep a sleep diary to help you and your health care provider figure out what could be causing your insomnia.  . Make your bedroom a dark, comfortable place where it is easy to fall asleep. ? Put up shades or blackout curtains to block light from outside. ? Use a white noise machine to block noise. ? Keep the temperature cool. . Limit screen use before bedtime. This includes: ? Watching TV. ? Using your smartphone, tablet, or computer. . Stick to a routine that includes going to bed and waking up at the same times every day and night. This can help you fall asleep faster. Consider making a quiet activity, such as reading, part of your nighttime routine. . Try to avoid taking naps during the day so that you sleep better at night. . Get out of bed if you are still awake after 15 minutes of trying to sleep. Keep the lights down, but try reading or doing a quiet activity. When you feel sleepy, go back to bed.  5. Vitamin D deficiency Low Vitamin D level contributes to fatigue and are associated with obesity, breast, and colon cancer. She agrees to continue to take prescription Vitamin D @50 ,000 IU every week and will follow-up for routine testing of Vitamin D, at least 2-3 times per year to avoid over-replacement.  Orders - Vitamin D,  Ergocalciferol, (DRISDOL) 1.25 MG (50000 UNIT) CAPS capsule; Take 1 capsule (50,000 Units total) by mouth every 7 (seven) days.  Dispense: 4 capsule; Refill: 0  6. At risk for diabetes mellitus Rebekah [name] was given approximately 15 minutes of diabetes education and counseling today. We discussed intensive lifestyle modifications today with an emphasis on weight loss as well as increasing exercise and decreasing simple carbohydrates in her diet. We also reviewed medication options with an emphasis on risk versus benefit of those discussed.   Repetitive spaced learning was employed today to elicit superior memory formation and behavioral change.  7. Class 1 obesity with serious comorbidity and body mass index (BMI) of 32.0 to 32.9 in adult, unspecified obesity type Rebekah [name] is currently in the action stage of change. As such, her goal is to continue with weight loss efforts. She has agreed to the Category 2 Plan.   Exercise goals: For substantial health benefits, adults should do at least 150 minutes (2 hours and 30 minutes) a week of moderate-intensity, or 75 minutes (1 hour and 15 minutes)  a week of vigorous-intensity aerobic physical activity, or an equivalent combination of moderate- and vigorous-intensity aerobic activity. Aerobic activity should be performed in episodes of at least 10 minutes, and preferably, it should be spread throughout the week. Adults should also include muscle-strengthening activities that involve all major muscle groups on 2 or more days a week.  Behavioral modification strategies: increasing lean protein intake, decreasing simple carbohydrates and increasing vegetables.  Rebekah [name] has agreed to follow-up with our clinic in 2 weeks. She was informed of the importance of frequent follow-up visits to maximize her success with intensive lifestyle modifications for her multiple health conditions.   Objective:   Blood pressure 139/84, pulse 76, temperature 97.9 F (36.6 C), temperature  source Oral, height 5\' 4"  (1.626 m), weight 188 lb (85.3 kg), SpO2 97 %. Body mass index is 32.27 kg/m.  General: Cooperative, alert, well developed, in no acute distress. HEENT: Conjunctivae and lids unremarkable. Cardiovascular: Regular rhythm.  Lungs: Normal work of breathing. Neurologic: No focal deficits.   Lab Results  Component Value Date   CREATININE 0.81 11/09/2019   BUN 12 11/09/2019   NA 139 11/09/2019   K 4.5 11/09/2019   CL 101 11/09/2019   CO2 24 11/09/2019   Lab Results  Component Value Date   ALT 22 11/09/2019   AST 25 11/09/2019   ALKPHOS 94 11/09/2019   BILITOT 0.2 11/09/2019   Lab Results  Component Value Date   HGBA1C 6.0 (H) 11/09/2019   HGBA1C 5.9 (H) 05/04/2019   HGBA1C 5.8 (H) 10/30/2018   HGBA1C 6.3 07/10/2018   HGBA1C 6.2 03/04/2018   Lab Results  Component Value Date   INSULIN 21.2 05/04/2019   INSULIN 25.1 (H) 10/30/2018   INSULIN 38.3 (H) 07/21/2018   Lab Results  Component Value Date   TSH 3.320 11/09/2019   Lab Results  Component Value Date   CHOL 129 11/09/2019   HDL 54 11/09/2019   LDLCALC 63 11/09/2019   TRIG 56 11/09/2019   CHOLHDL 2 07/10/2018   Lab Results  Component Value Date   WBC 10.2 11/09/2019   HGB 12.9 11/09/2019   HCT 39.7 11/09/2019   MCV 83 11/09/2019   PLT 355 11/09/2019   Lab Results  Component Value Date   IRON 86 01/07/2015   FERRITIN 19.1 01/07/2015   Attestation Statements:   Reviewed by clinician on day of visit: allergies, medications, problem list, medical history, surgical history, family history, social history, and previous encounter notes.  I, Water quality scientist, CMA, am acting as Location manager for [employer], DO.  I have reviewed the above documentation for accuracy and completeness, and I agree with the above. Briscoe Deutscher, DO

## 2019-11-23 NOTE — Telephone Encounter (Signed)
Please advise 

## 2019-11-24 ENCOUNTER — Other Ambulatory Visit (INDEPENDENT_AMBULATORY_CARE_PROVIDER_SITE_OTHER): Payer: Self-pay

## 2019-11-24 DIAGNOSIS — R7303 Prediabetes: Secondary | ICD-10-CM

## 2019-11-24 MED ORDER — SAXENDA 18 MG/3ML ~~LOC~~ SOPN: 3.0000 mg | PEN_INJECTOR | Freq: Every day | SUBCUTANEOUS | 0 refills | Status: DC

## 2019-11-24 NOTE — Telephone Encounter (Signed)
Pt is aware and voiced understanding. We will get prior auth. Moshe Salisbury, CMA

## 2019-11-26 ENCOUNTER — Encounter (INDEPENDENT_AMBULATORY_CARE_PROVIDER_SITE_OTHER): Payer: Self-pay

## 2019-11-30 ENCOUNTER — Telehealth (INDEPENDENT_AMBULATORY_CARE_PROVIDER_SITE_OTHER): Payer: Self-pay | Admitting: Family Medicine

## 2019-11-30 ENCOUNTER — Other Ambulatory Visit (INDEPENDENT_AMBULATORY_CARE_PROVIDER_SITE_OTHER): Payer: Self-pay

## 2019-11-30 DIAGNOSIS — R7303 Prediabetes: Secondary | ICD-10-CM

## 2019-11-30 MED ORDER — BD PEN NEEDLE NANO 2ND GEN 32G X 4 MM MISC: 1.0000 | Freq: Two times a day (BID) | 0 refills | Status: DC

## 2019-11-30 NOTE — Telephone Encounter (Signed)
I spoke with the pt and the needles prescription was sent to the pharmacy.

## 2019-12-21 ENCOUNTER — Encounter (INDEPENDENT_AMBULATORY_CARE_PROVIDER_SITE_OTHER): Payer: Self-pay | Admitting: Family Medicine

## 2019-12-21 ENCOUNTER — Other Ambulatory Visit: Payer: Self-pay

## 2019-12-21 ENCOUNTER — Ambulatory Visit (INDEPENDENT_AMBULATORY_CARE_PROVIDER_SITE_OTHER): Payer: 59 | Admitting: Family Medicine

## 2019-12-21 VITALS — BP 94/66 | HR 79 | Temp 98.0°F | Ht 64.0 in | Wt 187.0 lb

## 2019-12-21 DIAGNOSIS — Z9189 Other specified personal risk factors, not elsewhere classified: Secondary | ICD-10-CM | POA: Diagnosis not present

## 2019-12-21 DIAGNOSIS — E669 Obesity, unspecified: Secondary | ICD-10-CM

## 2019-12-21 DIAGNOSIS — R7303 Prediabetes: Secondary | ICD-10-CM

## 2019-12-21 DIAGNOSIS — G4733 Obstructive sleep apnea (adult) (pediatric): Secondary | ICD-10-CM | POA: Diagnosis not present

## 2019-12-21 DIAGNOSIS — Z6832 Body mass index (BMI) 32.0-32.9, adult: Secondary | ICD-10-CM

## 2019-12-21 DIAGNOSIS — E7849 Other hyperlipidemia: Secondary | ICD-10-CM

## 2019-12-21 DIAGNOSIS — E559 Vitamin D deficiency, unspecified: Secondary | ICD-10-CM | POA: Diagnosis not present

## 2019-12-21 DIAGNOSIS — I10 Essential (primary) hypertension: Secondary | ICD-10-CM

## 2019-12-21 MED ORDER — LISINOPRIL 10 MG PO TABS: 5.0000 mg | ORAL_TABLET | Freq: Every day | ORAL | 0 refills | Status: DC

## 2019-12-21 MED ORDER — VITAMIN D (ERGOCALCIFEROL) 1.25 MG (50000 UNIT) PO CAPS: 50000.0000 [IU] | ORAL_CAPSULE | ORAL | 0 refills | Status: DC

## 2019-12-21 NOTE — Progress Notes (Signed)
Chief Complaint:   OBESITY Rebekah [name] is here to discuss her progress with her obesity treatment plan along with follow-up of her obesity related diagnoses. Rebekah [name] is on the Category 2 Plan and states she is following her eating plan approximately 50% of the time. Rebekah [name] states she is walking for 45 minutes 3 times per week.  Today's visit was #: 22 Starting weight: 198 lbs Starting date: 07/21/2018 Today's weight: 187 lbs Today's date: 12/21/2019 Total lbs lost to date: 11 lb Total lbs lost since last in-office visit: 1 lb  Interim History: Rebekah [name] says she started [name] last week.  She is tolerating the 0.6 mg dose.  She is now walking again.  She said her mood is improving with better weather.  She is still craving sweets at night.  She likes to eat mango ice cream.  She cannot overeat or eat too quickly.  Subjective:   1. Prediabetes Rebekah [name] has a diagnosis of prediabetes based on her elevated HgA1c and was informed this puts her at greater risk of developing diabetes. She continues to work on diet and exercise to decrease her risk of diabetes. She denies nausea or hypoglycemia.  She is taking Saxenda and metformin.  Lab Results  Component Value Date   HGBA1C 6.0 (H) 11/09/2019   Lab Results  Component Value Date   INSULIN 21.2 05/04/2019   INSULIN 25.1 (H) 10/30/2018   INSULIN 38.3 (H) 07/21/2018   2. Vitamin D deficiency Rebekah [name]'s Vitamin D level was 34.5 on 11/09/2019. She is currently taking vit D. She denies nausea, vomiting or muscle weakness.  3. OSA (obstructive sleep apnea) Rebekah [name] has a diagnosis of sleep apnea. She reports that she is not using a CPAP regularly.   4. Essential hypertension Review: taking medications as instructed, no medication side effects noted, no chest pain on exertion, no dyspnea on exertion, no swelling of ankles.  She is taking lisinopril for blood pressure.  Today it is 94/66.  She has had some orthostatic symptoms.   BP Readings from Last 3  Encounters:  12/21/19 94/66  11/23/19 139/84  11/13/19 116/72   5. Other hyperlipidemia Rebekah [name] has hyperlipidemia and has been trying to improve her cholesterol levels with intensive lifestyle modification including a low saturated fat diet, exercise and weight loss. She denies any chest pain, claudication or myalgias.  She is taking Crestor.  Cholseterol is a goal.  Lab Results  Component Value Date   ALT 22 11/09/2019   AST 25 11/09/2019   ALKPHOS 94 11/09/2019   BILITOT 0.2 11/09/2019   Lab Results  Component Value Date   CHOL 129 11/09/2019   HDL 54 11/09/2019   LDLCALC 63 11/09/2019   TRIG 56 11/09/2019   CHOLHDL 2 07/10/2018   6. At risk for complication associated with hypotension The patient is at a higher than average risk of hypotension due to weight loss and use of medication.  Assessment/Plan:   1. Prediabetes Rebekah [name] will continue to work on weight loss, exercise, and decreasing simple carbohydrates to help decrease the risk of diabetes.   2. Vitamin D deficiency Low Vitamin D level contributes to fatigue and are associated with obesity, breast, and colon cancer. She agrees to continue to take prescription Vitamin D @50 ,000 IU every week and will follow-up for routine testing of Vitamin D, at least 2-3 times per year to avoid over-replacement.  Orders - Vitamin D, Ergocalciferol, (DRISDOL) 1.25 MG (50000 UNIT) CAPS capsule; Take 1 capsule (50,000 Units total) by  mouth every 7 (seven) days.  Dispense: 4 capsule; Refill: 0  3. OSA (obstructive sleep apnea) Intensive lifestyle modifications are the first line treatment for this issue. We discussed several lifestyle modifications today and she will continue to work on diet, exercise and weight loss efforts. We will continue to monitor. Orders and follow up as documented in patient record.   Counseling  Sleep apnea is a condition in which breathing pauses or becomes shallow during sleep. This happens over and over  during the night. This disrupts your sleep and keeps your body from getting the rest that it needs, which can cause tiredness and lack of energy (fatigue) during the day.  Sleep apnea treatment: If you were given a device to open your airway while you sleep, USE IT!  Sleep hygiene:   Limit or avoid alcohol, caffeinated beverages, and cigarettes, especially close to bedtime.   Do not eat a large meal or eat spicy foods right before bedtime. This can lead to digestive discomfort that can make it hard for you to sleep.  Keep a sleep diary to help you and your health care provider figure out what could be causing your insomnia.  . Make your bedroom a dark, comfortable place where it is easy to fall asleep. ? Put up shades or blackout curtains to block light from outside. ? Use a white noise machine to block noise. ? Keep the temperature cool. . Limit screen use before bedtime. This includes: ? Watching TV. ? Using your smartphone, tablet, or computer. . Stick to a routine that includes going to bed and waking up at the same times every day and night. This can help you fall asleep faster. Consider making a quiet activity, such as reading, part of your nighttime routine. . Try to avoid taking naps during the day so that you sleep better at night. . Get out of bed if you are still awake after 15 minutes of trying to sleep. Keep the lights down, but try reading or doing a quiet activity. When you feel sleepy, go back to bed.  4. Essential hypertension Rebekah [name] is working on healthy weight loss and exercise to improve blood pressure control. We will watch for signs of hypotension as she continues her lifestyle modifications. Will decreased her lisinopril to 1/2 tablet daily, as below.  Orders - lisinopril (ZESTRIL) 10 MG tablet; Take 0.5 tablets (5 mg total) by mouth daily.  Dispense: 90 tablet; Refill: 0  5. Other hyperlipidemia Cardiovascular risk and specific lipid/LDL goals reviewed.  We  discussed several lifestyle modifications today and Rebekah [name] will continue to work on diet, exercise and weight loss efforts. Orders and follow up as documented in patient record.   Counseling Intensive lifestyle modifications are the first line treatment for this issue. . Dietary changes: Increase soluble fiber. Decrease simple carbohydrates. . Exercise changes: Moderate to vigorous-intensity aerobic activity 150 minutes per week if tolerated. . Lipid-lowering medications: see documented in medical record.  6. At risk for complication associated with hypotension Rebekah [name] was given approximately 15 minutes of education and counseling today to help avoid hypotension. We discussed risks of hypotension with weight loss and signs of hypotension such as feeling lightheaded or unsteady.  Repetitive spaced learning was employed today to elicit superior memory formation and behavioral change.  7. Class 1 obesity with serious comorbidity and body mass index (BMI) of 32.0 to 32.9 in adult, unspecified obesity type Rebekah [name] is currently in the action stage of change. As such, her goal is to  continue with weight loss efforts. She has agreed to the Category 2 Plan.   Exercise goals: As is.  Behavioral modification strategies: increasing lean protein intake and increasing water intake.  Rebekah [name] has agreed to follow-up with our clinic in 2 weeks. She was informed of the importance of frequent follow-up visits to maximize her success with intensive lifestyle modifications for her multiple health conditions.   Objective:   Blood pressure 94/66, pulse 79, temperature 98 F (36.7 C), temperature source Oral, height 5\' 4"  (1.626 m), weight 187 lb (84.8 kg), SpO2 99 %. Body mass index is 32.1 kg/m.  General: Cooperative, alert, well developed, in no acute distress. HEENT: Conjunctivae and lids unremarkable. Cardiovascular: Regular rhythm.  Lungs: Normal work of breathing. Neurologic: No focal deficits.   Lab  Results  Component Value Date   CREATININE 0.81 11/09/2019   BUN 12 11/09/2019   NA 139 11/09/2019   K 4.5 11/09/2019   CL 101 11/09/2019   CO2 24 11/09/2019   Lab Results  Component Value Date   ALT 22 11/09/2019   AST 25 11/09/2019   ALKPHOS 94 11/09/2019   BILITOT 0.2 11/09/2019   Lab Results  Component Value Date   HGBA1C 6.0 (H) 11/09/2019   HGBA1C 5.9 (H) 05/04/2019   HGBA1C 5.8 (H) 10/30/2018   HGBA1C 6.3 07/10/2018   HGBA1C 6.2 03/04/2018   Lab Results  Component Value Date   INSULIN 21.2 05/04/2019   INSULIN 25.1 (H) 10/30/2018   INSULIN 38.3 (H) 07/21/2018   Lab Results  Component Value Date   TSH 3.320 11/09/2019   Lab Results  Component Value Date   CHOL 129 11/09/2019   HDL 54 11/09/2019   LDLCALC 63 11/09/2019   TRIG 56 11/09/2019   CHOLHDL 2 07/10/2018   Lab Results  Component Value Date   WBC 10.2 11/09/2019   HGB 12.9 11/09/2019   HCT 39.7 11/09/2019   MCV 83 11/09/2019   PLT 355 11/09/2019   Lab Results  Component Value Date   IRON 86 01/07/2015   FERRITIN 19.1 01/07/2015   Attestation Statements:   Reviewed by clinician on day of visit: allergies, medications, problem list, medical history, surgical history, family history, social history, and previous encounter notes.  I, Water quality scientist, CMA, am acting as Location manager for [employer], DO.  I have reviewed the above documentation for accuracy and completeness, and I agree with the above. Briscoe Deutscher, DO

## 2020-01-10 ENCOUNTER — Encounter (INDEPENDENT_AMBULATORY_CARE_PROVIDER_SITE_OTHER): Payer: Self-pay | Admitting: Family Medicine

## 2020-01-10 DIAGNOSIS — E8881 Metabolic syndrome: Secondary | ICD-10-CM | POA: Insufficient documentation

## 2020-01-11 ENCOUNTER — Ambulatory Visit (INDEPENDENT_AMBULATORY_CARE_PROVIDER_SITE_OTHER): Payer: 59 | Admitting: Family Medicine

## 2020-01-11 ENCOUNTER — Encounter (INDEPENDENT_AMBULATORY_CARE_PROVIDER_SITE_OTHER): Payer: Self-pay | Admitting: Family Medicine

## 2020-01-11 ENCOUNTER — Other Ambulatory Visit: Payer: Self-pay

## 2020-01-11 VITALS — BP 101/69 | HR 71 | Temp 98.0°F | Ht 64.0 in | Wt 186.0 lb

## 2020-01-11 DIAGNOSIS — E782 Mixed hyperlipidemia: Secondary | ICD-10-CM

## 2020-01-11 DIAGNOSIS — Z9189 Other specified personal risk factors, not elsewhere classified: Secondary | ICD-10-CM

## 2020-01-11 DIAGNOSIS — R7303 Prediabetes: Secondary | ICD-10-CM | POA: Diagnosis not present

## 2020-01-11 DIAGNOSIS — E8881 Metabolic syndrome: Secondary | ICD-10-CM

## 2020-01-11 DIAGNOSIS — I10 Essential (primary) hypertension: Secondary | ICD-10-CM

## 2020-01-11 DIAGNOSIS — E669 Obesity, unspecified: Secondary | ICD-10-CM

## 2020-01-11 DIAGNOSIS — Z6831 Body mass index (BMI) 31.0-31.9, adult: Secondary | ICD-10-CM

## 2020-01-11 NOTE — Progress Notes (Signed)
Chief Complaint:   OBESITY Rebekah [name] is here to discuss her progress with her obesity treatment plan along with follow-up of her obesity related diagnoses. Haasini is on the Category 2 Plan and states she is following her eating plan approximately 65-70% of the time. Cira states she is walking for 40 minutes 3 times per week.  Today's visit was #: 23 Starting weight: 198 lbs Starting date: 07/21/2018 Today's weight: 186 lbs Today's date: 01/11/2020 Total lbs lost to date: 12 lbs Total lbs lost since last in-office visit: 1 lb  Interim History: Rebekah [name] says she has been doing some night eating.  She is craving sweets.  She is doing well otherwise.  She is hoping that warmer weight helps her change her environment (less TV, more gardening/crafting).  Subjective:   1. Metabolic syndrome Risk factors (3 or more constitute Metabolic Syndrome): waist > 35" for female impaired fasting blood sugar on blood pressure medication on cholesterol medication.  2. Prediabetes Rebekah [name] has a diagnosis of prediabetes based on her elevated HgA1c and was informed this puts her at greater risk of developing diabetes. She continues to work on diet and exercise to decrease her risk of diabetes. She denies nausea or hypoglycemia.    Lab Results  Component Value Date   HGBA1C 6.0 (H) 11/09/2019   Lab Results  Component Value Date   INSULIN 21.2 05/04/2019   INSULIN 25.1 (H) 10/30/2018   INSULIN 38.3 (H) 07/21/2018   3. Essential hypertension Review: taking medications as instructed, no medication side effects noted, no chest pain on exertion, no dyspnea on exertion, no swelling of ankles.  She is taking lisinopril 5 mg.  She is still having some orthostasis on this dose.  BP Readings from Last 3 Encounters:  01/11/20 101/69  12/21/19 94/66  11/23/19 139/84   4. Mixed hyperlipidemia Sonnia has hyperlipidemia and has been trying to improve her cholesterol levels with intensive lifestyle modification  including a low saturated fat diet, exercise and weight loss. She denies any chest pain, claudication or myalgias.  She is taking Crestor.  Lab Results  Component Value Date   ALT 22 11/09/2019   AST 25 11/09/2019   ALKPHOS 94 11/09/2019   BILITOT 0.2 11/09/2019   Lab Results  Component Value Date   CHOL 129 11/09/2019   HDL 54 11/09/2019   LDLCALC 63 11/09/2019   TRIG 56 11/09/2019   CHOLHDL 2 07/10/2018   5. At risk for nausea Deann is at increased risk of nausea due to increasing her Saxenda dose.  Assessment/Plan:   1. Metabolic syndrome Counseling: Intensive lifestyle modifications are the first line treatment for this issue. We discussed several lifestyle modifications today and she will continue to work on diet, exercise and weight loss efforts. We will continue to monitor. Orders and follow up as documented in patient record.  Counseling  Metabolic syndrome is a combination of conditions such as abdominal obesity, high blood sugar, high blood pressure, and unhealthy levels of cholesterol. Metabolic syndrome raises your risk of developing heart disease, diabetes, and stroke.  Following a heart-healthy diet can help you lower your risk of heart disease and improve metabolic syndrome. This diet includes lean proteins, healthy fats, nuts, and plenty of fruits and vegetables.  Along with regular exercise, following a healthy diet can help your body use insulin better and help you lose weight.  A common goal for people with metabolic syndrome is to lose 7-10% of their starting weight.  2. Prediabetes Rebekah [name]  will continue to work on weight loss, exercise, and decreasing simple carbohydrates to help decrease the risk of diabetes. Currently taking Saxenda at 0.6 mg and will titrate up to 3 mg.  instructions have been provided.  3. Essential hypertension Rebekah [name] is working on healthy weight loss and exercise to improve blood pressure control. We will watch for signs of hypotension  as she continues her lifestyle modifications.  Okay to stop lisinopril if she continues to have orthostasis.    4. Mixed hyperlipidemia Cardiovascular risk and specific lipid/LDL goals reviewed.  We discussed several lifestyle modifications today and Rebekah [name] will continue to work on diet, exercise and weight loss efforts. Orders and follow up as documented in patient record.   Counseling Intensive lifestyle modifications are the first line treatment for this issue. . Dietary changes: Increase soluble fiber. Decrease simple carbohydrates. . Exercise changes: Moderate to vigorous-intensity aerobic activity 150 minutes per week if tolerated. . Lipid-lowering medications: see documented in medical record.  5. At risk for nausea Rebekah [name] was given approximately 15 minutes of nausea prevention counseling today. Rebekah [name] is at risk for nausea due to her new or current medication. She was encouraged to titrate her medication slowly, make sure to stay hydrated, eat smaller portions throughout the day, and avoid high fat meals.   6. Class 1 obesity with serious comorbidity and body mass index (BMI) of 31.0 to 31.9 in adult, unspecified obesity type Rebekah [name] is currently in the action stage of change. As such, her goal is to continue with weight loss efforts. She has agreed to the Category 2 Plan.   Exercise goals: As is.  Behavioral modification strategies: increasing lean protein intake and increasing water intake.  Rebekah [name] has agreed to follow-up with our clinic in 2-3 weeks. She was informed of the importance of frequent follow-up visits to maximize her success with intensive lifestyle modifications for her multiple health conditions.   Objective:   Blood pressure 101/69, pulse 71, temperature 98 F (36.7 C), temperature source Oral, height 5\' 4"  (1.626 m), weight 186 lb (84.4 kg), SpO2 99 %. Body mass index is 31.93 kg/m.  General: Cooperative, alert, well developed, in no acute distress. HEENT:  Conjunctivae and lids unremarkable. Cardiovascular: Regular rhythm.  Lungs: Normal work of breathing. Neurologic: No focal deficits.   Lab Results  Component Value Date   CREATININE 0.81 11/09/2019   BUN 12 11/09/2019   NA 139 11/09/2019   K 4.5 11/09/2019   CL 101 11/09/2019   CO2 24 11/09/2019   Lab Results  Component Value Date   ALT 22 11/09/2019   AST 25 11/09/2019   ALKPHOS 94 11/09/2019   BILITOT 0.2 11/09/2019   Lab Results  Component Value Date   HGBA1C 6.0 (H) 11/09/2019   HGBA1C 5.9 (H) 05/04/2019   HGBA1C 5.8 (H) 10/30/2018   HGBA1C 6.3 07/10/2018   HGBA1C 6.2 03/04/2018   Lab Results  Component Value Date   INSULIN 21.2 05/04/2019   INSULIN 25.1 (H) 10/30/2018   INSULIN 38.3 (H) 07/21/2018   Lab Results  Component Value Date   TSH 3.320 11/09/2019   Lab Results  Component Value Date   CHOL 129 11/09/2019   HDL 54 11/09/2019   LDLCALC 63 11/09/2019   TRIG 56 11/09/2019   CHOLHDL 2 07/10/2018   Lab Results  Component Value Date   WBC 10.2 11/09/2019   HGB 12.9 11/09/2019   HCT 39.7 11/09/2019   MCV 83 11/09/2019   PLT 355 11/09/2019  Lab Results  Component Value Date   IRON 86 01/07/2015   FERRITIN 19.1 01/07/2015   Attestation Statements:   Reviewed by clinician on day of visit: allergies, medications, problem list, medical history, surgical history, family history, social history, and previous encounter notes.  I, Water quality scientist, CMA, am acting as Location manager for [employer], DO.  I have reviewed the above documentation for accuracy and completeness, and I agree with the above. Briscoe Deutscher, DO

## 2020-01-28 ENCOUNTER — Other Ambulatory Visit (INDEPENDENT_AMBULATORY_CARE_PROVIDER_SITE_OTHER): Payer: Self-pay | Admitting: Family Medicine

## 2020-01-28 DIAGNOSIS — R7303 Prediabetes: Secondary | ICD-10-CM

## 2020-02-02 ENCOUNTER — Ambulatory Visit (INDEPENDENT_AMBULATORY_CARE_PROVIDER_SITE_OTHER): Payer: 59 | Admitting: Family Medicine

## 2020-02-02 ENCOUNTER — Other Ambulatory Visit: Payer: Self-pay

## 2020-02-02 VITALS — BP 123/75 | HR 73 | Temp 98.0°F | Ht 64.0 in | Wt 186.0 lb

## 2020-02-02 DIAGNOSIS — E669 Obesity, unspecified: Secondary | ICD-10-CM

## 2020-02-02 DIAGNOSIS — R7303 Prediabetes: Secondary | ICD-10-CM

## 2020-02-02 DIAGNOSIS — I10 Essential (primary) hypertension: Secondary | ICD-10-CM

## 2020-02-02 DIAGNOSIS — Z6831 Body mass index (BMI) 31.0-31.9, adult: Secondary | ICD-10-CM

## 2020-02-02 DIAGNOSIS — Z9189 Other specified personal risk factors, not elsewhere classified: Secondary | ICD-10-CM

## 2020-02-02 MED ORDER — METFORMIN HCL 500 MG PO TABS: 500.0000 mg | ORAL_TABLET | Freq: Every day | ORAL | 0 refills | Status: DC

## 2020-02-02 NOTE — Progress Notes (Signed)
Chief Complaint:   OBESITY Rebekah [name] is here to discuss her progress with her obesity treatment plan along with follow-up of her obesity related diagnoses. [name] is on the Category 2 Plan and states she is following her eating plan approximately 60% of the time. [name] states she is walking and working in the yard for 30 minutes 2 times per week.  Today's visit was #: 24 Starting weight: 198 lbs Starting date: 07/21/2018 Today's weight: 186 lbs Today's date: 02/02/2020 Total lbs lost to date: 12 Total lbs lost since last in-office visit: 0  Interim History: Clarity has been working in the garden and yard. She is going to the beach for a week this weekend (staying in a house). She is on Saxenda and reports no GI side effects. She is doing breakfast and lunch on the plan, and dinner is a struggle, as she is cooking what is available.   Subjective:   1. Pre-diabetes Rebekah [name]'s last A1c was 6.0. She denies GI side effects of metformin.  2. Essential hypertension Rebekah [name]'s blood pressure is controlled today. She denies chest pain, chest pressure, or headaches.  3. At risk for heart disease Rebekah [name] is at a higher than average risk for cardiovascular disease due to obesity.   Assessment/Plan:   1. Pre-diabetes Rebekah [name] will continue to work on weight loss, exercise, and decreasing simple carbohydrates to help decrease the risk of diabetes. We will refill metformin for 90 days with no refills.  - metFORMIN (GLUCOPHAGE) 500 MG tablet; Take 1 tablet (500 mg total) by mouth daily with breakfast.  Dispense: 90 tablet; Refill: 0  2. Essential hypertension Rebekah [name] is working on healthy weight loss and exercise to improve blood pressure control. She will continue her current medications, no refills needed. We will watch for signs of hypotension as she continues her lifestyle modifications.  3. At risk for heart disease Rebekah [name] was given approximately 15 minutes of coronary artery disease prevention counseling  today. She is 59 y.o. female and has risk factors for heart disease including obesity. We discussed intensive lifestyle modifications today with an emphasis on specific weight loss instructions and strategies.   Repetitive spaced learning was employed today to elicit superior memory formation and behavioral change.  4. Class 1 obesity with serious comorbidity and body mass index (BMI) of 31.0 to 31.9 in adult, unspecified obesity type Rebekah [name] is currently in the action stage of change. As such, her goal is to continue with weight loss efforts. She has agreed to the Category 2 Plan with protein substitutions.   We discussed various medication options to help Rebekah [name] with her weight loss efforts and we both agreed to increase Saxenda to 1.5 mg for 1 week then 1.8 mg SubQ daily #5 pens with no refills.  Exercise goals: As is.  Behavioral modification strategies: increasing lean protein intake, increasing vegetables, meal planning and cooking strategies and travel eating strategies.  Rebekah [name] has agreed to follow-up with our clinic in 2 weeks. She was informed of the importance of frequent follow-up visits to maximize her success with intensive lifestyle modifications for her multiple health conditions.   Objective:   Blood pressure 123/75, pulse 73, temperature 98 F (36.7 C), temperature source Oral, height 5\' 4"  (1.626 m), weight 186 lb (84.4 kg), SpO2 99 %. Body mass index is 31.93 kg/m.  General: Cooperative, alert, well developed, in no acute distress. HEENT: Conjunctivae and lids unremarkable. Cardiovascular: Regular rhythm.  Lungs: Normal work of breathing. Neurologic: No focal deficits.  Lab Results  Component Value Date   CREATININE 0.81 11/09/2019   BUN 12 11/09/2019   NA 139 11/09/2019   K 4.5 11/09/2019   CL 101 11/09/2019   CO2 24 11/09/2019   Lab Results  Component Value Date   ALT 22 11/09/2019   AST 25 11/09/2019   ALKPHOS 94 11/09/2019   BILITOT 0.2 11/09/2019    Lab Results  Component Value Date   HGBA1C 6.0 (H) 11/09/2019   HGBA1C 5.9 (H) 05/04/2019   HGBA1C 5.8 (H) 10/30/2018   HGBA1C 6.3 07/10/2018   HGBA1C 6.2 03/04/2018   Lab Results  Component Value Date   INSULIN 21.2 05/04/2019   INSULIN 25.1 (H) 10/30/2018   INSULIN 38.3 (H) 07/21/2018   Lab Results  Component Value Date   TSH 3.320 11/09/2019   Lab Results  Component Value Date   CHOL 129 11/09/2019   HDL 54 11/09/2019   LDLCALC 63 11/09/2019   TRIG 56 11/09/2019   CHOLHDL 2 07/10/2018   Lab Results  Component Value Date   WBC 10.2 11/09/2019   HGB 12.9 11/09/2019   HCT 39.7 11/09/2019   MCV 83 11/09/2019   PLT 355 11/09/2019   Lab Results  Component Value Date   IRON 86 01/07/2015   FERRITIN 19.1 01/07/2015   Attestation Statements:   Reviewed by clinician on day of visit: allergies, medications, problem list, medical history, surgical history, family history, social history, and previous encounter notes.   I, Trixie Dredge, am acting as transcriptionist for [name], MD.  I have reviewed the above documentation for accuracy and completeness, and I agree with the above. - Ilene Qua, MD

## 2020-02-05 ENCOUNTER — Other Ambulatory Visit (INDEPENDENT_AMBULATORY_CARE_PROVIDER_SITE_OTHER): Payer: Self-pay | Admitting: Family Medicine

## 2020-02-05 DIAGNOSIS — E559 Vitamin D deficiency, unspecified: Secondary | ICD-10-CM

## 2020-02-20 ENCOUNTER — Encounter (HOSPITAL_COMMUNITY): Payer: Self-pay

## 2020-02-20 ENCOUNTER — Emergency Department (HOSPITAL_COMMUNITY)
Admission: EM | Admit: 2020-02-20 | Discharge: 2020-02-20 | Disposition: A | Discharge status: Home / Self Care | Payer: 59 | Attending: Emergency Medicine | Admitting: Emergency Medicine

## 2020-02-20 ENCOUNTER — Other Ambulatory Visit: Payer: Self-pay

## 2020-02-20 DIAGNOSIS — D72829 Elevated white blood cell count, unspecified: Secondary | ICD-10-CM | POA: Diagnosis not present

## 2020-02-20 DIAGNOSIS — E860 Dehydration: Secondary | ICD-10-CM | POA: Diagnosis not present

## 2020-02-20 DIAGNOSIS — R55 Syncope and collapse: Secondary | ICD-10-CM | POA: Diagnosis present

## 2020-02-20 DIAGNOSIS — Z7982 Long term (current) use of aspirin: Secondary | ICD-10-CM | POA: Diagnosis not present

## 2020-02-20 DIAGNOSIS — Z79899 Other long term (current) drug therapy: Secondary | ICD-10-CM | POA: Diagnosis not present

## 2020-02-20 LAB — LACTIC ACID, PLASMA: Lactic Acid, Venous: 2 mmol/L (ref 0.5–1.9)

## 2020-02-20 LAB — COMPREHENSIVE METABOLIC PANEL
ALT: 29 U/L (ref 0–44)
AST: 76 U/L — ABNORMAL HIGH (ref 15–41)
Albumin: 3.6 g/dL (ref 3.5–5.0)
Alkaline Phosphatase: 69 U/L (ref 38–126)
Anion gap: 12 (ref 5–15)
BUN: 18 mg/dL (ref 6–20)
CO2: 19 mmol/L — ABNORMAL LOW (ref 22–32)
Calcium: 8.5 mg/dL — ABNORMAL LOW (ref 8.9–10.3)
Chloride: 106 mmol/L (ref 98–111)
Creatinine, Ser: 1.01 mg/dL — ABNORMAL HIGH (ref 0.44–1.00)
GFR calc Af Amer: >60 mL/min (ref >60)
GFR calc non Af Amer: >60 mL/min (ref >60)
Glucose, Bld: 104 mg/dL — ABNORMAL HIGH (ref 70–99)
Potassium: 5.3 mmol/L — ABNORMAL HIGH (ref 3.5–5.1)
Sodium: 137 mmol/L (ref 135–145)
Total Bilirubin: 1.1 mg/dL (ref 0.3–1.2)
Total Protein: 6.9 g/dL (ref 6.5–8.1)

## 2020-02-20 LAB — TROPONIN I (HIGH SENSITIVITY)
Troponin I (High Sensitivity): <2 ng/L (ref <18)
Troponin I (High Sensitivity): 2 ng/L (ref <18)

## 2020-02-20 LAB — URINALYSIS, ROUTINE W REFLEX MICROSCOPIC
Bilirubin Urine: NEGATIVE
Glucose, UA: 150 mg/dL — AB
Hgb urine dipstick: NEGATIVE
Ketones, ur: NEGATIVE mg/dL
Leukocytes,Ua: NEGATIVE
Nitrite: NEGATIVE
Protein, ur: NEGATIVE mg/dL
Specific Gravity, Urine: 1.009 (ref 1.005–1.030)
pH: 5 (ref 5.0–8.0)

## 2020-02-20 LAB — CBC
HCT: 48.5 % — ABNORMAL HIGH (ref 36.0–46.0)
Hemoglobin: 15.2 g/dL — ABNORMAL HIGH (ref 12.0–15.0)
MCH: 27.4 pg (ref 26.0–34.0)
MCHC: 31.3 g/dL (ref 30.0–36.0)
MCV: 87.4 fL (ref 80.0–100.0)
Platelets: 331 10*3/uL (ref 150–400)
RBC: 5.55 MIL/uL — ABNORMAL HIGH (ref 3.87–5.11)
RDW: 17.1 % — ABNORMAL HIGH (ref 11.5–15.5)
WBC: 18 10*3/uL — ABNORMAL HIGH (ref 4.0–10.5)
nRBC: 0 % (ref 0.0–0.2)

## 2020-02-20 MED ORDER — SODIUM CHLORIDE 0.9 % IV BOLUS
1000.0000 mL | Freq: Once | INTRAVENOUS | Status: AC
  Administered 2020-02-20: 1000 mL via INTRAVENOUS

## 2020-02-20 NOTE — ED Provider Notes (Signed)
[facility] Provider Note   CSN: [id number] Arrival date & time: 02/20/20  1229     History Chief Complaint  Patient presents with  . Near Syncope    Rebekah [name] is a 59 y.o. female.  Patient presents with near syncope event this AM. Symptoms acute onset, indicates had just used bathroom, got up, and was about to change clothes to do yardwork, when had acute onset lightheadedness/feeling faint.  Felt fine/normal this AM, had gone to yard sales, had eaten breakfast. Notes normal bm this AM, stool normal color. No hx melena or rectal bleeding. Denies abd pain. No nausea/vomiting. No dysuria or gu c/o. No cough or uri symptoms. Remote hx gastric banding surgery. Pt continues to pursue wt loss goals with diet/exercise. No acute/abrupt change in wt. States at pcp visit blood pressure very well controlled, 100-110 range, so bp med dose was decreased. No recent new meds. No fever or chills. Does not feel sick or ill.   The history is provided by the patient.  Near Syncope Pertinent negatives include no chest pain, no abdominal pain and no shortness of breath.       Past Medical History:  Diagnosis Date  . Allergy   . Back pain   . Bowel obstruction ([place]) 2019  . Depression   . Gall bladder stones   . GERD (gastroesophageal reflux disease)   . Hyperlipidemia   . Hypertension   . IBS (irritable bowel syndrome)   . IBS (irritable bowel syndrome)   . Joint pain   . Obesity    Lap band surgery 12-08 Dr Hassell Done  . OSA (obstructive sleep apnea)    using a CPAP    . Pre-diabetes   . REDUCTION MAMMOPLASTY, HX OF 09/22/2007   Qualifier: Diagnosis of  By: Larose Kells MD, [place]     Patient Active Problem List   Diagnosis Date Noted  . Metabolic syndrome [id number]  . Morbid obesity ([place]) 01/10/2020  . Migraines 11/13/2019  . Pain in right knee 11/05/2019  . Prediabetes 07/21/2018  . SBO (small bowel obstruction) ([place]) 09/28/2017  . Dysphagia   .  PCP NOTES >>>> 09/25/2015  . GERD 10/12/2009  . Hyperlipidemia 09/22/2007  . Anxiety and depression 09/22/2007  . Essential hypertension 09/22/2007  . Seasonal and perennial allergic rhinitis 09/22/2007  . Obstructive sleep apnea 09/22/2007    Past Surgical History:  Procedure Laterality Date  . APPENDECTOMY    . BREAST REDUCTION SURGERY    . CHOLECYSTECTOMY    . CLOSED REDUCTION FINGER WITH PERCUTANEOUS PINNING Left 09/14/2014   Procedure: CLOSED REDUCTION PERCUTANEOUS PINNING ;  Surgeon: Leanora Cover, MD;  Location: [facility];  Service: Orthopedics;  Laterality: Left;  . ESOPHAGEAL MANOMETRY N/A 09/23/2017   Procedure: ESOPHAGEAL MANOMETRY (EM);  Surgeon: Mauri Pole, MD;  Location: [facility];  Service: Endoscopy;  Laterality: N/A;  . Lap Band Adjustment  09/01/2015   Dr. Redmond Pulling  . LAPAROSCOPIC GASTRIC BANDING  09/2007   Lap band surgery 12-08 Dr Hassell Done  . NASAL SINUS SURGERY    . TONSILLECTOMY     x2     OB History   No obstetric history on file.     Family History  Problem Relation Age of Onset  . Coronary artery disease Father        CABG, smoker .age onset 26s  . Prostate cancer Father   . Pulmonary fibrosis Father        and  others  . High blood pressure Father   . High Cholesterol Father   . Depression Father   . Obesity Father   . Breast cancer Mother        age 31  . Heart disease Mother        CABG, smoker .age onset 46s  . Diverticulitis Mother   . High blood pressure Mother   . High Cholesterol Mother   . Thyroid disease Mother   . Depression Mother   . Obesity Mother   . Heart attack Brother        age 10  . Diabetes Neg Hx   . Colon cancer Neg Hx   . Stomach cancer Neg Hx   . Esophageal cancer Neg Hx   . Rectal cancer Neg Hx     Social History   Tobacco Use  . Smoking status: Never Smoker  . Smokeless tobacco: Never Used  Substance Use Topics  . Alcohol use: Yes    Comment: socially   . Drug use: No     Home Medications Prior to Admission medications   Medication Sig Start Date End Date Taking? Authorizing Provider  aspirin 81 MG tablet Take 1 tablet (81 mg total) by mouth daily. 09/04/13   Colon Branch, MD  estradiol (CLIMARA) 0.06 MG/24HR Place 1 patch onto the skin once a week.    [provider]  Insulin Pen Needle (BD PEN NEEDLE NANO 2ND GEN) 32G X 4 MM MISC 1 Package by Does not apply route 2 (two) times daily. 11/30/19   Briscoe Deutscher, DO  L-Methylfolate-Algae (DEPLIN 7.5) 7.5-90.314 MG CAPS Take 1 capsule by mouth daily.     [provider]  Liraglutide -Weight Management (SAXENDA) 18 MG/3ML SOPN Inject 3 mg into the skin daily. 11/24/19   Briscoe Deutscher, DO  lisinopril (ZESTRIL) 10 MG tablet Take 0.5 tablets (5 mg total) by mouth daily. 12/21/19   Briscoe Deutscher, DO  metFORMIN (GLUCOPHAGE) 500 MG tablet Take 1 tablet (500 mg total) by mouth daily with breakfast. 02/02/20   Eber Jones, MD  Multiple Vitamin (MULTIVITAMIN) tablet Take 1 tablet by mouth daily. 09/04/13   Colon Branch, MD  nortriptyline (PAMELOR) 75 MG capsule Take 1 capsule (75 mg total) by mouth daily. 09/27/15   Colon Branch, MD  omeprazole (PRILOSEC) 40 MG capsule Take 1 capsule (40 mg total) by mouth daily. 11/17/19   Colon Branch, MD  ondansetron (ZOFRAN) 4 MG tablet Take 1 tablet (4 mg total) by mouth every 6 (six) hours. 10/26/19   Briscoe Deutscher, DO  Probiotic Product (PROBIOTIC-10) CHEW Chew 1 tablet by mouth daily.     [provider]  progesterone (PROMETRIUM) 100 MG capsule Take 100 mg by mouth daily. 08/30/16   [provider]  rosuvastatin (CRESTOR) 10 MG tablet Take 1 tablet (10 mg total) by mouth daily. 07/15/19   Colon Branch, MD  Vitamin D, Ergocalciferol, (DRISDOL) 1.25 MG (50000 UNIT) CAPS capsule TAKE 1 CAPSULE (50,000 UNITS TOTAL) BY MOUTH EVERY 7 (SEVEN) DAYS. 02/09/20   Briscoe Deutscher, DO    Allergies    Penicillins and Erythromycin  Review of Systems   Review  of Systems  Constitutional: Negative for chills and fever.  HENT: Negative for sore throat.   Eyes: Negative for redness.  Respiratory: Negative for cough and shortness of breath.   Cardiovascular: Positive for near-syncope. Negative for chest pain and palpitations.  Gastrointestinal: Negative for abdominal pain, blood in stool  and vomiting.  Endocrine: Negative for polyuria.  Genitourinary: Negative for dysuria and flank pain.  Musculoskeletal: Negative for back pain.  Skin: Negative for rash.  Neurological: Positive for light-headedness. Negative for syncope.  Hematological: Does not bruise/bleed easily.  Psychiatric/Behavioral: Negative for confusion.    Physical Exam Updated Vital Signs BP 100/75 (BP Location: Left Arm)   Pulse 81   Temp (!) 97.3 F (36.3 C) (Oral)   Resp (!) 23   Ht 1.626 m (5\' 4" )   Wt 81.6 kg   SpO2 96%   BMI 30.90 kg/m   Physical Exam Vitals and nursing note reviewed.  Constitutional:      Appearance: Normal appearance. She is well-developed.  HENT:     Head: Atraumatic.     Nose: Nose normal.     Mouth/Throat:     Mouth: Mucous membranes are moist.  Eyes:     General: No scleral icterus.    Conjunctiva/sclera: Conjunctivae normal.  Neck:     Trachea: No tracheal deviation.  Cardiovascular:     Rate and Rhythm: Normal rate and regular rhythm.     Pulses: Normal pulses.     Heart sounds: Normal heart sounds. No murmur. No friction rub. No gallop.   Pulmonary:     Effort: Pulmonary effort is normal. No respiratory distress.     Breath sounds: Normal breath sounds.  Abdominal:     General: Bowel sounds are normal. There is no distension.     Palpations: Abdomen is soft. There is no mass.     Tenderness: There is no abdominal tenderness. There is no guarding.  Genitourinary:    Comments: No cva tenderness.  Musculoskeletal:        General: No swelling or tenderness.     Cervical back: Normal range of motion and neck supple. No rigidity.  No muscular tenderness.     Right lower leg: No edema.     Left lower leg: No edema.  Skin:    General: Skin is warm and dry.     Findings: No rash.  Neurological:     Mental Status: She is alert.     Comments: Alert, speech normal. Motor/sens grossly intact bil. Steady gait.   Psychiatric:        Mood and Affect: Mood normal.     ED Results / Procedures / Treatments   Labs (all labs ordered are listed, but only abnormal results are displayed) Results for orders placed or performed during the hospital encounter of 02/20/20  CBC  Result Value Ref Range   WBC 18.0 (H) 4.0 - 10.5 K/uL   RBC 5.55 (H) 3.87 - 5.11 MIL/uL   Hemoglobin 15.2 (H) 12.0 - 15.0 g/dL   HCT 48.5 (H) 36.0 - 46.0 %   MCV 87.4 80.0 - 100.0 fL   MCH 27.4 26.0 - 34.0 pg   MCHC 31.3 30.0 - 36.0 g/dL   RDW 17.1 (H) 11.5 - 15.5 %   Platelets 331 150 - 400 K/uL   nRBC 0.0 0.0 - 0.2 %  Comprehensive metabolic panel  Result Value Ref Range   Sodium 137 135 - 145 mmol/L   Potassium 5.3 (H) 3.5 - 5.1 mmol/L   Chloride 106 98 - 111 mmol/L   CO2 19 (L) 22 - 32 mmol/L   Glucose, Bld 104 (H) 70 - 99 mg/dL   BUN 18 6 - 20 mg/dL   Creatinine, Ser 1.01 (H) 0.44 - 1.00 mg/dL   Calcium 8.5 (L) 8.9 -  10.3 mg/dL   Total Protein 6.9 6.5 - 8.1 g/dL   Albumin 3.6 3.5 - 5.0 g/dL   AST 76 (H) 15 - 41 U/L   ALT 29 0 - 44 U/L   Alkaline Phosphatase 69 38 - 126 U/L   Total Bilirubin 1.1 0.3 - 1.2 mg/dL   GFR calc non Af Amer >60 >60 mL/min   GFR calc Af Amer >60 >60 mL/min   Anion gap 12 5 - 15  Urinalysis, Routine w reflex microscopic  Result Value Ref Range   Color, Urine STRAW (A) YELLOW   APPearance CLEAR CLEAR   Specific Gravity, Urine 1.009 1.005 - 1.030   pH 5.0 5.0 - 8.0   Glucose, UA 150 (A) NEGATIVE mg/dL   Hgb urine dipstick NEGATIVE NEGATIVE   Bilirubin Urine NEGATIVE NEGATIVE   Ketones, ur NEGATIVE NEGATIVE mg/dL   Protein, ur NEGATIVE NEGATIVE mg/dL   Nitrite NEGATIVE NEGATIVE   Leukocytes,Ua NEGATIVE  NEGATIVE  Lactic acid, plasma  Result Value Ref Range   Lactic Acid, Venous 2.0 (HH) 0.5 - 1.9 mmol/L  Troponin I (High Sensitivity)  Result Value Ref Range   Troponin I (High Sensitivity) <2 <18 ng/L  Troponin I (High Sensitivity)  Result Value Ref Range   Troponin I (High Sensitivity) 2 <18 ng/L    EKG EKG Interpretation  Date/Time:  Saturday Feb 20 2020 12:46:32 EDT Ventricular Rate:  84 PR Interval:    QRS Duration: 97 QT Interval:  428 QTC Calculation: 506 R Axis:   48 Text Interpretation: Sinus rhythm Nonspecific ST abnormality Borderline prolonged QT interval Confirmed by Lajean Saver [phone]) on 02/20/2020 1:02:32 PM   Radiology No results found.  Procedures Procedures (including critical care time)  Medications Ordered in ED Medications  sodium chloride 0.9 % bolus 1,000 mL (has no administration in time range)    ED Course  I have reviewed the triage vital signs and the nursing notes.  Pertinent labs & imaging results that were available during my care of the patient were reviewed by me and considered in my medical decision making (see chart for details).    MDM Rules/Calculators/A&P                      Iv ns. Continuous pulse ox and monitor. Stat labs. Ecg.   Iv ns bolus.   Reviewed nursing notes and prior charts for additional history.   Initial labs reviewed/interpreted by me - initial trop normal. Wbc is elev compared to prior. Pt denies fever, chills or sweats. Denies any infectious symptoms, states feels fine.   Initial lactate borderline high. Ns bolus. Recheck temp - remains afebrile, and continues to deny any infectious symptoms. No pain or discomfort. Await additional labs.    Additional labs reviewed/interpreted by me - delta trop neg and not increasing. No cp or discomfort.   Await ua.   Additional fluids, po/iv.  UA neg for infection.   Pts bp has remained normal. Pt afebrile. Denies chills/sweats, pain, or any other specific  complaint.   Pt currently appears stable for d/c.   Rec pcp f/u.  Return precautions provided.       Final Clinical Impression(s) / ED Diagnoses Final diagnoses:  None    Rx / DC Orders ED Discharge Orders    None       Lajean Saver, MD 02/20/20 1800

## 2020-02-20 NOTE — Discharge Instructions (Addendum)
It was our pleasure to provide your ER care today - we hope that you feel better.  Rest. Drink plenty of fluids.   For now, hold/stop the lisinopril.  Follow up with your doctor in the coming week for recheck - call Monday to arrange follow up. From today's lab tests, your white blood count is elevated, follow up with your doctor.  Return to ER right away if worse, symptoms recur, fainting, chest pain, trouble breathing, fevers, or other concern.

## 2020-02-20 NOTE — ED Triage Notes (Signed)
Pt from home with ems for near syncope today. Pt states she felt normal waking up this morning, went shopping and when she returned home to start yard work she suddenly felt dizzy and needed to lay down. Pt initial Bp was 60 palpated. Pt given 869ml fluid en route, last BP 124/74. Pt a.o, denies any pain

## 2020-02-20 NOTE — ED Notes (Signed)
Pt ambulatory to restroom with steady gait, denies dizziness or weakness

## 2020-02-20 NOTE — ED Notes (Signed)
tp br again

## 2020-02-20 NOTE — ED Notes (Signed)
bp good no pain

## 2020-02-23 ENCOUNTER — Ambulatory Visit (INDEPENDENT_AMBULATORY_CARE_PROVIDER_SITE_OTHER): Payer: 59 | Admitting: Family Medicine

## 2020-02-23 ENCOUNTER — Encounter (INDEPENDENT_AMBULATORY_CARE_PROVIDER_SITE_OTHER): Payer: Self-pay | Admitting: Family Medicine

## 2020-02-23 ENCOUNTER — Other Ambulatory Visit: Payer: Self-pay

## 2020-02-23 VITALS — BP 122/80 | HR 74 | Temp 97.9°F | Ht 64.0 in | Wt 182.0 lb

## 2020-02-23 DIAGNOSIS — E875 Hyperkalemia: Secondary | ICD-10-CM | POA: Diagnosis not present

## 2020-02-23 DIAGNOSIS — E669 Obesity, unspecified: Secondary | ICD-10-CM

## 2020-02-23 DIAGNOSIS — R7303 Prediabetes: Secondary | ICD-10-CM

## 2020-02-23 DIAGNOSIS — Z9189 Other specified personal risk factors, not elsewhere classified: Secondary | ICD-10-CM

## 2020-02-23 DIAGNOSIS — I10 Essential (primary) hypertension: Secondary | ICD-10-CM | POA: Diagnosis not present

## 2020-02-23 DIAGNOSIS — D72828 Other elevated white blood cell count: Secondary | ICD-10-CM

## 2020-02-23 DIAGNOSIS — E66811 Obesity, class 1: Secondary | ICD-10-CM

## 2020-02-23 DIAGNOSIS — Z6831 Body mass index (BMI) 31.0-31.9, adult: Secondary | ICD-10-CM

## 2020-02-23 NOTE — Progress Notes (Signed)
Chief Complaint:   OBESITY Rebekah [name] is here to discuss her progress with her obesity treatment plan along with follow-up of her obesity related diagnoses. Rebekah [name] is on the Category 2 Plan and states she is following her eating plan approximately 25% of the time. Rebekah [name] states she is walking for 60 minutes 6 times per week.  Today's visit was #: 25 Starting weight: 198 lbs Starting date: 07/21/2018 Today's weight: 182 lbs Today's date: 02/23/2020 Total lbs lost to date: 16 lbs Total lbs lost since last in-office visit: 4 lbs  Interim History: Rebekah [name] had to go to the ED 4 days ago and was diagnosed with severe dehydration.  Prior to the ED, she went to the beach and indulged in fried oysters twice.  She is working on getting all protein in (example would be a salad but not always adding protein in it).  She is going up to [place] in mid May for a week.  Subjective:   1. Essential hypertension Review: taking medications as instructed, no medication side effects noted, no chest pain on exertion, no dyspnea on exertion, no swelling of ankles.  Rebekah [name]'s blood pressure was low over the weekend.  She hasn't taken her lisinopril.  No dizziness or lightheadedness.   BP Readings from Last 3 Encounters:  02/23/20 122/80  02/20/20 117/76  02/02/20 123/75   2. Hyperkalemia Potassium significantly elevated at 5.3.  Rebekah [name] is significantly dehydrated.  Lab Results  Component Value Date   K 5.3 (H) 02/20/2020   3. Other elevated white blood cell (WBC) count Rebekah [name]'s WBC count was elevated at 18 when she was in the ED.  No signs or symptoms of infection.  Lab Results  Component Value Date   WBC 18.0 (H) 02/20/2020   4. At risk for hypertension The patient is at a higher than average risk of hypertension due to stopping lisinopril.  Assessment/Plan:   1. Essential hypertension Stop lisinopril.  2. Hyperkalemia Will check CMP today. - Comprehensive metabolic panel  3. Other elevated  white blood cell (WBC) count Will check CBC today. - CBC with Differential/Platelet  4. At risk for hypertension Rebekah [name] was given approximately 15 minutes of hypertension prevention counseling today. Rebekah [name] is at risk for hypertension due to obesity. We discussed intensive lifestyle modifications today with an emphasis on weight loss as well as increasing exercise and decreasing salt intake.  Repetitive spaced learning was employed today to elicit superior memory formation and behavioral change.  5. Class 1 obesity with serious comorbidity and body mass index (BMI) of 31.0 to 31.9 in adult, unspecified obesity type Rebekah [name] is currently in the action stage of change. As such, her goal is to continue with weight loss efforts. She has agreed to the Category 2 Plan.   Exercise goals: As is.  Behavioral modification strategies: increasing lean protein intake, increasing vegetables, meal planning and cooking strategies, keeping healthy foods in the home and planning for success.  Rebekah [name] has agreed to follow-up with our clinic in 2-3 weeks. She was informed of the importance of frequent follow-up visits to maximize her success with intensive lifestyle modifications for her multiple health conditions.   Rebekah [name] was informed we would discuss her lab results at her next visit unless there is a critical issue that needs to be addressed sooner. Rebekah [name] agreed to keep her next visit at the agreed upon time to discuss these results.  Objective:   Blood pressure 122/80, pulse 74, temperature 97.9 F (36.6 C),  temperature source Oral, height 5\' 4"  (1.626 m), weight 182 lb (82.6 kg), SpO2 99 %. Body mass index is 31.24 kg/m.  General: Cooperative, alert, well developed, in no acute distress. HEENT: Conjunctivae and lids unremarkable. Cardiovascular: Regular rhythm.  Lungs: Normal work of breathing. Neurologic: No focal deficits.   Lab Results  Component Value Date   CREATININE 1.01 (H) 02/20/2020    BUN 18 02/20/2020   NA 137 02/20/2020   K 5.3 (H) 02/20/2020   CL 106 02/20/2020   CO2 19 (L) 02/20/2020   Lab Results  Component Value Date   ALT 29 02/20/2020   AST 76 (H) 02/20/2020   ALKPHOS 69 02/20/2020   BILITOT 1.1 02/20/2020   Lab Results  Component Value Date   HGBA1C 6.0 (H) 11/09/2019   HGBA1C 5.9 (H) 05/04/2019   HGBA1C 5.8 (H) 10/30/2018   HGBA1C 6.3 07/10/2018   HGBA1C 6.2 03/04/2018   Lab Results  Component Value Date   INSULIN 21.2 05/04/2019   INSULIN 25.1 (H) 10/30/2018   INSULIN 38.3 (H) 07/21/2018   Lab Results  Component Value Date   TSH 3.320 11/09/2019   Lab Results  Component Value Date   CHOL 129 11/09/2019   HDL 54 11/09/2019   LDLCALC 63 11/09/2019   TRIG 56 11/09/2019   CHOLHDL 2 07/10/2018   Lab Results  Component Value Date   WBC 18.0 (H) 02/20/2020   HGB 15.2 (H) 02/20/2020   HCT 48.5 (H) 02/20/2020   MCV 87.4 02/20/2020   PLT 331 02/20/2020   Lab Results  Component Value Date   IRON 86 01/07/2015   FERRITIN 19.1 01/07/2015   Attestation Statements:   Reviewed by clinician on day of visit: allergies, medications, problem list, medical history, surgical history, family history, social history, and previous encounter notes.  I, Water quality scientist, CMA, am acting as transcriptionist for Coralie Common, MD.  I have reviewed the above documentation for accuracy and completeness, and I agree with the above. - Jinny Blossom, MD

## 2020-02-24 ENCOUNTER — Other Ambulatory Visit (INDEPENDENT_AMBULATORY_CARE_PROVIDER_SITE_OTHER): Payer: Self-pay | Admitting: Family Medicine

## 2020-02-24 DIAGNOSIS — R7303 Prediabetes: Secondary | ICD-10-CM

## 2020-02-24 LAB — CBC WITH DIFFERENTIAL/PLATELET
Basophils Absolute: 0 10*3/uL (ref 0.0–0.2)
Basos: 1 %
EOS (ABSOLUTE): 0.1 10*3/uL (ref 0.0–0.4)
Eos: 2 %
Hematocrit: 38.2 % (ref 34.0–46.6)
Hemoglobin: 12.4 g/dL (ref 11.1–15.9)
Immature Grans (Abs): 0 10*3/uL (ref 0.0–0.1)
Immature Granulocytes: 0 %
Lymphocytes Absolute: 2.9 10*3/uL (ref 0.7–3.1)
Lymphs: 36 %
MCH: 27.9 pg (ref 26.6–33.0)
MCHC: 32.5 g/dL (ref 31.5–35.7)
MCV: 86 fL (ref 79–97)
Monocytes Absolute: 0.4 10*3/uL (ref 0.1–0.9)
Monocytes: 5 %
Neutrophils Absolute: 4.5 10*3/uL (ref 1.4–7.0)
Neutrophils: 56 %
Platelets: 310 10*3/uL (ref 150–450)
RBC: 4.44 x10E6/uL (ref 3.77–5.28)
RDW: 15.8 % — ABNORMAL HIGH (ref 11.7–15.4)
WBC: 8 10*3/uL (ref 3.4–10.8)

## 2020-02-24 LAB — COMPREHENSIVE METABOLIC PANEL
ALT: 21 IU/L (ref 0–32)
AST: 23 IU/L (ref 0–40)
Albumin/Globulin Ratio: 1.6 (ref 1.2–2.2)
Albumin: 4.2 g/dL (ref 3.8–4.9)
Alkaline Phosphatase: 76 IU/L (ref 39–117)
BUN/Creatinine Ratio: 17 (ref 9–23)
BUN: 14 mg/dL (ref 6–24)
Bilirubin Total: <0.2 mg/dL (ref 0.0–1.2)
CO2: 22 mmol/L (ref 20–29)
Calcium: 9.2 mg/dL (ref 8.7–10.2)
Chloride: 105 mmol/L (ref 96–106)
Creatinine, Ser: 0.82 mg/dL (ref 0.57–1.00)
GFR calc Af Amer: 92 mL/min/{1.73_m2} (ref >59)
GFR calc non Af Amer: 80 mL/min/{1.73_m2} (ref >59)
Globulin, Total: 2.6 g/dL (ref 1.5–4.5)
Glucose: 92 mg/dL (ref 65–99)
Potassium: 4.4 mmol/L (ref 3.5–5.2)
Sodium: 140 mmol/L (ref 134–144)
Total Protein: 6.8 g/dL (ref 6.0–8.5)

## 2020-02-24 MED ORDER — BD PEN NEEDLE NANO 2ND GEN 32G X 4 MM MISC: 1.0000 | Freq: Two times a day (BID) | 0 refills | Status: DC

## 2020-02-24 MED ORDER — SAXENDA 18 MG/3ML ~~LOC~~ SOPN: 3.0000 mg | PEN_INJECTOR | Freq: Every day | SUBCUTANEOUS | 0 refills | Status: DC

## 2020-03-08 ENCOUNTER — Other Ambulatory Visit (INDEPENDENT_AMBULATORY_CARE_PROVIDER_SITE_OTHER): Payer: Self-pay | Admitting: Family Medicine

## 2020-03-08 DIAGNOSIS — E559 Vitamin D deficiency, unspecified: Secondary | ICD-10-CM

## 2020-03-15 ENCOUNTER — Other Ambulatory Visit: Payer: Self-pay

## 2020-03-15 ENCOUNTER — Encounter (INDEPENDENT_AMBULATORY_CARE_PROVIDER_SITE_OTHER): Payer: Self-pay | Admitting: Family Medicine

## 2020-03-15 ENCOUNTER — Ambulatory Visit (INDEPENDENT_AMBULATORY_CARE_PROVIDER_SITE_OTHER): Payer: 59 | Admitting: Family Medicine

## 2020-03-15 VITALS — BP 119/79 | HR 72 | Temp 97.4°F | Ht 64.0 in | Wt 183.0 lb

## 2020-03-15 DIAGNOSIS — Z9189 Other specified personal risk factors, not elsewhere classified: Secondary | ICD-10-CM

## 2020-03-15 DIAGNOSIS — E7849 Other hyperlipidemia: Secondary | ICD-10-CM | POA: Diagnosis not present

## 2020-03-15 DIAGNOSIS — R7303 Prediabetes: Secondary | ICD-10-CM

## 2020-03-15 DIAGNOSIS — E559 Vitamin D deficiency, unspecified: Secondary | ICD-10-CM | POA: Diagnosis not present

## 2020-03-15 DIAGNOSIS — E669 Obesity, unspecified: Secondary | ICD-10-CM

## 2020-03-15 DIAGNOSIS — Z6831 Body mass index (BMI) 31.0-31.9, adult: Secondary | ICD-10-CM

## 2020-03-15 MED ORDER — METFORMIN HCL 500 MG PO TABS: 500.0000 mg | ORAL_TABLET | Freq: Every day | ORAL | 0 refills | Status: DC

## 2020-03-15 MED ORDER — VITAMIN D (ERGOCALCIFEROL) 1.25 MG (50000 UNIT) PO CAPS: 50000.0000 [IU] | ORAL_CAPSULE | ORAL | 0 refills | Status: DC

## 2020-03-15 NOTE — Progress Notes (Signed)
Chief Complaint:   OBESITY Rebekah [name] is here to discuss her progress with her obesity treatment plan along with follow-up of her obesity related diagnoses. Rebekah [name] is on the Category 2 Plan and states she is following her eating plan approximately 10% of the time. Rebekah [name] states she is walking 2 miles 6 times per week.  Today's visit was #: 3 Starting weight: 198 lbs Starting date: 07/21/2018 Today's weight: 183 lbs Today's date: 03/15/2020 Total lbs lost to date: 15 lbs Total lbs lost since last in-office visit: 0  Interim History: Rebekah [name] went to [facility] over the past few weeks and really enjoyed herself touring some sights.  She says she did a good amount of walking.  She also reports doing some indulgent eating.  She has all the food from the plan at home.  She does not have any produce from her garden yet.  Subjective:   1. Vitamin D deficiency Rebekah [name]'s Vitamin D level was 34.5 on 11/09/2019. She is currently taking prescription vitamin D 50,000 IU each week. She denies nausea, vomiting or muscle weakness.  2. Prediabetes Rebekah [name] has a diagnosis of prediabetes based on her elevated HgA1c and was informed this puts her at greater risk of developing diabetes. She continues to work on diet and exercise to decrease her risk of diabetes. She denies nausea or hypoglycemia.  She is taking metformin and endorses carb cravings.  Lab Results  Component Value Date   HGBA1C 6.0 (H) 11/09/2019   Lab Results  Component Value Date   INSULIN 21.2 05/04/2019   INSULIN 25.1 (H) 10/30/2018   INSULIN 38.3 (H) 07/21/2018   3. Other hyperlipidemia Rebekah [name] has hyperlipidemia and has been trying to improve her cholesterol levels with intensive lifestyle modification including a low saturated fat diet, exercise and weight loss. She denies any chest pain, claudication or myalgias.  She is taking Crestor 10 mg daily.  Lab Results  Component Value Date   ALT 21 02/23/2020   AST 23 02/23/2020   ALKPHOS 76 02/23/2020   BILITOT <0.2 02/23/2020   Lab Results  Component Value Date   CHOL 129 11/09/2019   HDL 54 11/09/2019   LDLCALC 63 11/09/2019   TRIG 56 11/09/2019   CHOLHDL 2 07/10/2018   4. At risk for heart disease Rebekah [name] is at a higher than average risk for cardiovascular disease due to obesity.   Assessment/Plan:   1. Vitamin D deficiency Low Vitamin D level contributes to fatigue and are associated with obesity, breast, and colon cancer. She agrees to continue to take prescription Vitamin D @50 ,000 IU every week and will follow-up for routine testing of Vitamin D, at least 2-3 times per year to avoid over-replacement. - Vitamin D, Ergocalciferol, (DRISDOL) 1.25 MG (50000 UNIT) CAPS capsule; Take 1 capsule (50,000 Units total) by mouth every 7 (seven) days.  Dispense: 12 capsule; Refill: 0 - Vitamin B12 - VITAMIN D 25 Hydroxy (Vit-D Deficiency, Fractures)  2. Prediabetes Tihanna will continue to work on weight loss, exercise, and decreasing simple carbohydrates to help decrease the risk of diabetes.  - metFORMIN (GLUCOPHAGE) 500 MG tablet; Take 1 tablet (500 mg total) by mouth daily with breakfast.  Dispense: 90 tablet; Refill: 0 - Hemoglobin A1c - Insulin, random  3. Other hyperlipidemia Cardiovascular risk and specific lipid/LDL goals reviewed.  We discussed several lifestyle modifications today and Rebekah [name] will continue to work on diet, exercise and weight loss efforts. Orders and follow up as documented in patient record.  Counseling Intensive lifestyle modifications are the first line treatment for this issue. . Dietary changes: Increase soluble fiber. Decrease simple carbohydrates. . Exercise changes: Moderate to vigorous-intensity aerobic activity 150 minutes per week if tolerated. . Lipid-lowering medications: see documented in medical record. - Lipid Panel With LDL/HDL Ratio  4. At risk for heart disease Rebekah [name] was given approximately 15 minutes of coronary  artery disease prevention counseling today. She is 59 y.o. female and has risk factors for heart disease including obesity. We discussed intensive lifestyle modifications today with an emphasis on specific weight loss instructions and strategies.   Repetitive spaced learning was employed today to elicit superior memory formation and behavioral change.  5. Class 1 obesity with serious comorbidity and body mass index (BMI) of 31.0 to 31.9 in adult, unspecified obesity type Rebekah [name] is currently in the action stage of change. As such, her goal is to continue with weight loss efforts. She has agreed to the Category 2 Plan.   Exercise goals: All adults should avoid inactivity. Some physical activity is better than none, and adults who participate in any amount of physical activity gain some health benefits.  Behavioral modification strategies: increasing lean protein intake, increasing vegetables, meal planning and cooking strategies and keeping healthy foods in the home.  Rebekah [name] has agreed to follow-up with our clinic in 2 weeks. She was informed of the importance of frequent follow-up visits to maximize her success with intensive lifestyle modifications for her multiple health conditions.   Rebekah [name] was informed we would discuss her lab results at her next visit unless there is a critical issue that needs to be addressed sooner. Rebekah [name] agreed to keep her next visit at the agreed upon time to discuss these results.  Objective:   Blood pressure 119/79, pulse 72, temperature (!) 97.4 F (36.3 C), temperature source Oral, height 5\' 4"  (1.626 m), weight 183 lb (83 kg), SpO2 100 %. Body mass index is 31.41 kg/m.  General: Cooperative, alert, well developed, in no acute distress. HEENT: Conjunctivae and lids unremarkable. Cardiovascular: Regular rhythm.  Lungs: Normal work of breathing. Neurologic: No focal deficits.   Lab Results  Component Value Date   CREATININE 0.82 02/23/2020   BUN 14 02/23/2020    NA 140 02/23/2020   K 4.4 02/23/2020   CL 105 02/23/2020   CO2 22 02/23/2020   Lab Results  Component Value Date   ALT 21 02/23/2020   AST 23 02/23/2020   ALKPHOS 76 02/23/2020   BILITOT <0.2 02/23/2020   Lab Results  Component Value Date   HGBA1C 6.0 (H) 11/09/2019   HGBA1C 5.9 (H) 05/04/2019   HGBA1C 5.8 (H) 10/30/2018   HGBA1C 6.3 07/10/2018   HGBA1C 6.2 03/04/2018   Lab Results  Component Value Date   INSULIN 21.2 05/04/2019   INSULIN 25.1 (H) 10/30/2018   INSULIN 38.3 (H) 07/21/2018   Lab Results  Component Value Date   TSH 3.320 11/09/2019   Lab Results  Component Value Date   CHOL 129 11/09/2019   HDL 54 11/09/2019   LDLCALC 63 11/09/2019   TRIG 56 11/09/2019   CHOLHDL 2 07/10/2018   Lab Results  Component Value Date   WBC 8.0 02/23/2020   HGB 12.4 02/23/2020   HCT 38.2 02/23/2020   MCV 86 02/23/2020   PLT 310 02/23/2020   Lab Results  Component Value Date   IRON 86 01/07/2015   FERRITIN 19.1 01/07/2015   Attestation Statements:   Reviewed by clinician on day of visit: allergies,  medications, problem list, medical history, surgical history, family history, social history, and previous encounter notes.  I, Water quality scientist, CMA, am acting as transcriptionist for Coralie Common, MD.  I have reviewed the above documentation for accuracy and completeness, and I agree with the above. - Jinny Blossom, MD

## 2020-03-17 ENCOUNTER — Other Ambulatory Visit (INDEPENDENT_AMBULATORY_CARE_PROVIDER_SITE_OTHER): Payer: Self-pay | Admitting: Family Medicine

## 2020-03-17 DIAGNOSIS — Z6831 Body mass index (BMI) 31.0-31.9, adult: Secondary | ICD-10-CM

## 2020-03-17 DIAGNOSIS — E669 Obesity, unspecified: Secondary | ICD-10-CM

## 2020-03-17 LAB — VITAMIN D 25 HYDROXY (VIT D DEFICIENCY, FRACTURES): Vit D, 25-Hydroxy: 62.4 ng/mL (ref 30.0–100.0)

## 2020-03-17 LAB — LIPID PANEL WITH LDL/HDL RATIO
Cholesterol, Total: 128 mg/dL (ref 100–199)
HDL: 54 mg/dL (ref >39)
LDL Chol Calc (NIH): 58 mg/dL (ref 0–99)
LDL/HDL Ratio: 1.1 ratio (ref 0.0–3.2)
Triglycerides: 79 mg/dL (ref 0–149)
VLDL Cholesterol Cal: 16 mg/dL (ref 5–40)

## 2020-03-17 LAB — INSULIN, RANDOM: INSULIN: 24.6 u[IU]/mL (ref 2.6–24.9)

## 2020-03-17 LAB — HEMOGLOBIN A1C
Est. average glucose Bld gHb Est-mCnc: 120 mg/dL
Hgb A1c MFr Bld: 5.8 % — ABNORMAL HIGH (ref 4.8–5.6)

## 2020-03-17 LAB — VITAMIN B12: Vitamin B-12: 606 pg/mL (ref 232–1245)

## 2020-04-12 ENCOUNTER — Ambulatory Visit (INDEPENDENT_AMBULATORY_CARE_PROVIDER_SITE_OTHER): Payer: 59 | Admitting: Family Medicine

## 2020-05-02 ENCOUNTER — Ambulatory Visit (INDEPENDENT_AMBULATORY_CARE_PROVIDER_SITE_OTHER): Payer: 59 | Admitting: Family Medicine

## 2020-05-24 ENCOUNTER — Ambulatory Visit (INDEPENDENT_AMBULATORY_CARE_PROVIDER_SITE_OTHER): Payer: 59 | Admitting: Family Medicine

## 2020-07-09 ENCOUNTER — Other Ambulatory Visit: Payer: Self-pay | Admitting: Internal Medicine

## 2020-07-11 ENCOUNTER — Encounter: Payer: 59 | Admitting: Internal Medicine

## 2020-07-13 ENCOUNTER — Other Ambulatory Visit: Payer: Self-pay

## 2020-07-13 ENCOUNTER — Encounter: Payer: Self-pay | Admitting: Internal Medicine

## 2020-07-13 ENCOUNTER — Ambulatory Visit (INDEPENDENT_AMBULATORY_CARE_PROVIDER_SITE_OTHER): Payer: No Typology Code available for payment source | Admitting: Internal Medicine

## 2020-07-13 VITALS — BP 142/85 | HR 92 | Temp 97.6°F | Resp 18 | Ht 64.0 in | Wt 191.5 lb

## 2020-07-13 DIAGNOSIS — Z0000 Encounter for general adult medical examination without abnormal findings: Secondary | ICD-10-CM | POA: Diagnosis not present

## 2020-07-13 DIAGNOSIS — Z23 Encounter for immunization: Secondary | ICD-10-CM

## 2020-07-13 LAB — HM PAP SMEAR: HM Pap smear: NEGATIVE

## 2020-07-13 LAB — RESULTS CONSOLE HPV: CHL HPV: NEGATIVE

## 2020-07-13 NOTE — Progress Notes (Signed)
Subjective:    Patient ID: Rebekah [name], female    DOB: 1961-03-12, 59 y.o.   MRN: [id number]  DOS:  07/13/2020 Type of visit - description: CPX Since the last office visit has no new concerns. Her mother was sick, it was very stressful but things are getting back to normal. Has developed 2 lesions at the left index for few weeks.  No pain, no discharge.  Wt Readings from Last 3 Encounters:  07/13/20 191 lb 8 oz (86.9 kg)  03/15/20 183 lb (83 kg)  02/23/20 182 lb (82.6 kg)   BP Readings from Last 3 Encounters:  07/13/20 (!) 142/85  03/15/20 119/79  02/23/20 122/80     Review of Systems  Other than above, a 14 point review of systems is negative     Past Medical History:  Diagnosis Date  . Allergy   . Back pain   . Bowel obstruction ([place]) 2019  . Depression   . Gall bladder stones   . GERD (gastroesophageal reflux disease)   . Hyperlipidemia   . Hypertension   . IBS (irritable bowel syndrome)   . IBS (irritable bowel syndrome)   . Joint pain   . Obesity    Lap band surgery 12-08 Dr Hassell Done  . OSA (obstructive sleep apnea)    using a CPAP    . Pre-diabetes   . REDUCTION MAMMOPLASTY, HX OF 09/22/2007   Qualifier: Diagnosis of  By: Larose Kells MD, [place]     Past Surgical History:  Procedure Laterality Date  . APPENDECTOMY    . BREAST REDUCTION SURGERY    . CHOLECYSTECTOMY    . CLOSED REDUCTION FINGER WITH PERCUTANEOUS PINNING Left 09/14/2014   Procedure: CLOSED REDUCTION PERCUTANEOUS PINNING ;  Surgeon: Leanora Cover, MD;  Location: [facility];  Service: Orthopedics;  Laterality: Left;  . ESOPHAGEAL MANOMETRY N/A 09/23/2017   Procedure: ESOPHAGEAL MANOMETRY (EM);  Surgeon: Mauri Pole, MD;  Location: [facility];  Service: Endoscopy;  Laterality: N/A;  . Lap Band Adjustment  09/01/2015   Dr. Redmond Pulling  . LAPAROSCOPIC GASTRIC BANDING  09/2007   Lap band surgery 12-08 Dr Hassell Done  . NASAL SINUS SURGERY    . TONSILLECTOMY     x2    Allergies as  of 07/13/2020      Reactions   Penicillins Hives, Rash   Has patient had a PCN reaction causing immediate rash, facial/tongue/throat swelling, SOB or lightheadedness with hypotension: Yes Has patient had a PCN reaction causing severe rash involving mucus membranes or skin necrosis: Yes Has patient had a PCN reaction that required hospitalization: No Has patient had a PCN reaction occurring within the last 10 years: No If all of the above answers are "NO", then may proceed with Cephalosporin use.   Erythromycin Nausea Only   Gastrointestinal issues.      Medication List       Accurate as of July 13, 2020 11:59 PM. If you have any questions, ask your nurse or doctor.        STOP taking these medications   BD Pen Needle Nano 2nd Gen 32G X 4 MM Misc Generic drug: Insulin Pen Needle Stopped by: Kathlene November, MD   estradiol 0.06 MG/24HR Commonly known as: CLIMARA Stopped by: Kathlene November, MD   ondansetron 4 MG tablet Commonly known as: Zofran Stopped by: Kathlene November, MD   Saxenda 18 MG/3ML Sopn Generic drug: Liraglutide -Weight Management Stopped by: Kathlene November, MD   Vitamin D (Ergocalciferol)  1.25 MG (50000 UNIT) Caps capsule Commonly known as: DRISDOL Stopped by: Kathlene November, MD     TAKE these medications   aspirin 81 MG tablet Take 1 tablet (81 mg total) by mouth daily.   Deplin 7.5 7.5-90.314 MG Caps Take 1 capsule by mouth daily.   ELDERBERRY PO   metFORMIN 500 MG tablet Commonly known as: GLUCOPHAGE Take 1 tablet (500 mg total) by mouth daily with breakfast.   multivitamin tablet Take 1 tablet by mouth daily.   nortriptyline 75 MG capsule Commonly known as: PAMELOR Take 1 capsule (75 mg total) by mouth daily.   omeprazole 40 MG capsule Commonly known as: PRILOSEC Take 1 capsule (40 mg total) by mouth daily.   Probiotic-10 Chew Chew 1 tablet by mouth daily.   progesterone 100 MG capsule Commonly known as: PROMETRIUM Take 100 mg by mouth daily.   rosuvastatin  10 MG tablet Commonly known as: CRESTOR Take 1 tablet (10 mg total) by mouth daily.          Objective:   Physical Exam BP (!) 142/85 (BP Location: Left Arm, Patient Position: Sitting, Cuff Size: Normal)   Pulse 92   Temp 97.6 F (36.4 C) (Oral)   Resp 18   Ht 5\' 4"  (1.626 m)   Wt 191 lb 8 oz (86.9 kg)   SpO2 98%   BMI 32.87 kg/m   General: Well developed, NAD, BMI noted Neck: No  thyromegaly  HEENT:  Normocephalic . Face symmetric, atraumatic Lungs:  CTA B Normal respiratory effort, no intercostal retractions, no accessory muscle use. Heart: RRR,  no murmur.  Abdomen:  Not distended, soft, non-tender. No rebound or rigidity.  Labs noted at the RUQ, not tender. Lower extremities: no pretibial edema bilaterally  Skin: Left index, distally, dorsal aspect: Has 2 lesions, about 3 mm in size, pearly color, superficial, firm. Neurologic:  alert & oriented X3.  Speech normal, gait appropriate for age and unassisted Strength symmetric and appropriate for age.  Psych: Cognition and judgment appear intact.  Cooperative with normal attention span and concentration.  Behavior appropriate. No anxious or depressed appearing.     Assessment     Assessment  Prediabetes HTN Hyperlipidemia: lipitor aches (2018) Anxiety, depression (chronic sx, occ suicidal ideas, h/o self cutting). Used to see Dr Casimiro Needle GERD Migraines  Obesity, lap band surgery 2008 OSA: not using CPAP as off 06/2020 Acne (doxy) ++FH CAD: F, M , B MI age 58, G-parents  SBO 09-2017  PLAN: Prediabetes: Last A1c satisfactory, on Metformin, sees the wellness clinic regularly HTN:BP today slightly elevated, typically okay.  Recommend monitoring.  On no medicines. Hyperlipidemia: On Crestor, last FLP satisfactory OSA: Back in 2019 was seen again by neurology, reportedly had a new CPAP but has been unable to tolerate, recommend to consider nasal pillows.  Again risk of untreated sleep apnea discussed including  increased risk of CAD, strokes. Skin lesions: Likely benign, dermatofibroma?  Recommend to monitor, call if change in color or size, will see dermatologist next months, advised to check with them as well. RTC 1 year   This visit occurred during the SARS-CoV-2 public health emergency.  Safety protocols were in place, including screening questions prior to the visit, additional usage of staff PPE, and extensive cleaning of exam room while observing appropriate contact time as indicated for disinfecting solutions.

## 2020-07-13 NOTE — Progress Notes (Signed)
Pre visit review using our clinic review tool, if applicable. No additional management support is needed unless otherwise documented below in the visit note. 

## 2020-07-13 NOTE — Patient Instructions (Signed)
Check the  blood pressure regulalrly BP GOAL is between 110/65 and  135/85. If it is consistently higher or lower, let me know  Call for refills when needed    [place], [place] back for a physical exam in 1 year

## 2020-07-14 ENCOUNTER — Encounter: Payer: Self-pay | Admitting: Internal Medicine

## 2020-07-14 NOTE — Assessment & Plan Note (Signed)
Prediabetes: Last A1c satisfactory, on Metformin, sees the wellness clinic regularly HTN:BP today slightly elevated, typically okay.  Recommend monitoring.  On no medicines. Hyperlipidemia: On Crestor, last FLP satisfactory OSA: Back in 2019 was seen again by neurology, reportedly had a new CPAP but has been unable to tolerate, recommend to consider nasal pillows.  Again risk of untreated sleep apnea discussed including increased risk of CAD, strokes. Skin lesions: Likely benign, dermatofibroma?  Recommend to monitor, call if change in color or size, will see dermatologist next months, advised to check with them as well. RTC 1 year

## 2020-07-14 NOTE — Assessment & Plan Note (Signed)
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

## 2020-07-19 ENCOUNTER — Other Ambulatory Visit: Payer: Self-pay | Admitting: Obstetrics and Gynecology

## 2020-07-19 DIAGNOSIS — M8580 Other specified disorders of bone density and structure, unspecified site: Secondary | ICD-10-CM

## 2020-08-03 ENCOUNTER — Ambulatory Visit (INDEPENDENT_AMBULATORY_CARE_PROVIDER_SITE_OTHER): Payer: No Typology Code available for payment source | Admitting: Family Medicine

## 2020-08-03 ENCOUNTER — Other Ambulatory Visit: Payer: Self-pay

## 2020-08-03 ENCOUNTER — Encounter (INDEPENDENT_AMBULATORY_CARE_PROVIDER_SITE_OTHER): Payer: Self-pay | Admitting: Family Medicine

## 2020-08-03 VITALS — BP 129/85 | HR 87 | Temp 98.1°F | Ht 64.0 in | Wt 188.0 lb

## 2020-08-03 DIAGNOSIS — Z6832 Body mass index (BMI) 32.0-32.9, adult: Secondary | ICD-10-CM

## 2020-08-03 DIAGNOSIS — E669 Obesity, unspecified: Secondary | ICD-10-CM

## 2020-08-03 DIAGNOSIS — R7303 Prediabetes: Secondary | ICD-10-CM

## 2020-08-08 ENCOUNTER — Encounter: Payer: Self-pay | Admitting: Internal Medicine

## 2020-08-08 MED ORDER — DEPLIN 7.5 7.5-90.314 MG PO CAPS: 1.0000 | ORAL_CAPSULE | Freq: Every day | ORAL | 3 refills | Status: DC

## 2020-08-10 ENCOUNTER — Telehealth: Payer: Self-pay

## 2020-08-10 NOTE — Telephone Encounter (Signed)
PA initiated via Covermymeds; KEY: [id number]. Awaiting determination.

## 2020-08-12 NOTE — Telephone Encounter (Signed)
PA denied. Pt must meet the following requirements:  -Pt must have a clinical condition for which there is no appropriate formulary alternative -Pt needs a form of the drug that is not on formulary OR -Pt must have tried and failed the required number of preferred formulary alternatives first  No formulary alternatives have been given.

## 2020-08-15 NOTE — Progress Notes (Signed)
Chief Complaint:   OBESITY Rebekah [name] is here to discuss her progress with her obesity treatment plan along with follow-up of her obesity related diagnoses. Rebekah [name] is on the Category 2 Plan and states she is following her eating plan approximately 0% of the time. Rebekah [name] states she is doing 0 minutes 0 times per week.  Today's visit was #: 27 Starting weight: 198 lbs Starting date: 07/21/2018 Today's weight: 188 lbs Today's date: 08/03/2020 Total lbs lost to date: 10 Total lbs lost since last in-office visit: 0  Interim History: Rebekah [name] hasn't been able to follow her plan while caring for her sick elderly mother. She is ready to get back on track and iw willing to look at other meal options.  Subjective:   1. Pre-diabetes Rebekah [name] is stable on metformin, and she denies nausea, vomiting, or hypoglycemia. She stopped her GLP-1 Saxenda when she took a break from our program, but is doing well without it for now.  Assessment/Plan:   1. Pre-diabetes Rebekah [name] will continue to work on weight loss, diet, exercise, and decreasing simple carbohydrates to help decrease the risk of diabetes. Will continue to monitor closely.  2. Class 1 obesity with serious comorbidity and body mass index (BMI) of 32.0 to 32.9 in adult, unspecified obesity type Rebekah [name] is currently in the action stage of change. As such, her goal is to continue with weight loss efforts. She has agreed to following a lower carbohydrate, vegetable and lean protein rich diet plan.   Behavioral modification strategies: increasing lean protein intake and increasing water intake.  Rebekah [name] has agreed to follow-up with our clinic in 3 weeks. She was informed of the importance of frequent follow-up visits to maximize her success with intensive lifestyle modifications for her multiple health conditions.   Objective:   Blood pressure 129/85, pulse 87, temperature 98.1 F (36.7 C), height 5\' 4"  (1.626 m), weight 188 lb (85.3 kg), SpO2 97 %. Body mass  index is 32.27 kg/m.  General: Cooperative, alert, well developed, in no acute distress. HEENT: Conjunctivae and lids unremarkable. Cardiovascular: Regular rhythm.  Lungs: Normal work of breathing. Neurologic: No focal deficits.   Lab Results  Component Value Date   CREATININE 0.82 02/23/2020   BUN 14 02/23/2020   NA 140 02/23/2020   K 4.4 02/23/2020   CL 105 02/23/2020   CO2 22 02/23/2020   Lab Results  Component Value Date   ALT 21 02/23/2020   AST 23 02/23/2020   ALKPHOS 76 02/23/2020   BILITOT <0.2 02/23/2020   Lab Results  Component Value Date   HGBA1C 5.8 (H) 03/16/2020   HGBA1C 6.0 (H) 11/09/2019   HGBA1C 5.9 (H) 05/04/2019   HGBA1C 5.8 (H) 10/30/2018   HGBA1C 6.3 07/10/2018   Lab Results  Component Value Date   INSULIN 24.6 03/16/2020   INSULIN 21.2 05/04/2019   INSULIN 25.1 (H) 10/30/2018   INSULIN 38.3 (H) 07/21/2018   Lab Results  Component Value Date   TSH 3.320 11/09/2019   Lab Results  Component Value Date   CHOL 128 03/16/2020   HDL 54 03/16/2020   LDLCALC 58 03/16/2020   TRIG 79 03/16/2020   CHOLHDL 2 07/10/2018   Lab Results  Component Value Date   WBC 8.0 02/23/2020   HGB 12.4 02/23/2020   HCT 38.2 02/23/2020   MCV 86 02/23/2020   PLT 310 02/23/2020   Lab Results  Component Value Date   IRON 86 01/07/2015   FERRITIN 19.1 01/07/2015   Attestation  Statements:   Reviewed by clinician on day of visit: allergies, medications, problem list, medical history, surgical history, family history, social history, and previous encounter notes.  Time spent on visit including pre-visit chart review and post-visit care and charting was 20 minutes.    I, Trixie Dredge, am acting as transcriptionist for Dennard Nip, MD.  I have reviewed the above documentation for accuracy and completeness, and I agree with the above. -  Dennard Nip, MD

## 2020-08-16 MED ORDER — DEPLIN 7.5 7.5-90.314 MG PO CAPS: 1.0000 | ORAL_CAPSULE | Freq: Every day | ORAL | 3 refills | Status: DC

## 2020-08-24 ENCOUNTER — Other Ambulatory Visit: Payer: Self-pay

## 2020-08-24 ENCOUNTER — Ambulatory Visit (INDEPENDENT_AMBULATORY_CARE_PROVIDER_SITE_OTHER): Payer: No Typology Code available for payment source | Admitting: Family Medicine

## 2020-08-24 ENCOUNTER — Encounter (INDEPENDENT_AMBULATORY_CARE_PROVIDER_SITE_OTHER): Payer: Self-pay | Admitting: Family Medicine

## 2020-08-24 VITALS — BP 135/86 | HR 74 | Temp 97.5°F | Ht 64.0 in | Wt 184.0 lb

## 2020-08-24 DIAGNOSIS — Z6831 Body mass index (BMI) 31.0-31.9, adult: Secondary | ICD-10-CM

## 2020-08-24 DIAGNOSIS — E669 Obesity, unspecified: Secondary | ICD-10-CM

## 2020-08-24 DIAGNOSIS — Z9189 Other specified personal risk factors, not elsewhere classified: Secondary | ICD-10-CM | POA: Diagnosis not present

## 2020-08-24 DIAGNOSIS — R7303 Prediabetes: Secondary | ICD-10-CM | POA: Diagnosis not present

## 2020-08-24 MED ORDER — METFORMIN HCL 500 MG PO TABS: 500.0000 mg | ORAL_TABLET | Freq: Every day | ORAL | 0 refills | Status: DC

## 2020-08-24 NOTE — Progress Notes (Signed)
Chief Complaint:   OBESITY Rebekah [name] is here to discuss her progress with her obesity treatment plan along with follow-up of her obesity related diagnoses. Rebekah [name] is on following a lower carbohydrate, vegetable and lean protein rich diet plan and states she is following her eating plan approximately 0% of the time. Rebekah [name] states she is walking for 15-60 minutes 3 times per week.  Today's visit was #: 28 Starting weight: 198 lbs Starting date: 07/21/2018 Today's weight: 184 lbs Today's date: 08/24/2020 Total lbs lost to date: 14 Total lbs lost since last in-office visit: 4  Interim History: Rebekah [name] went on vacation and wasn't able to follow her plan closely but instead she was mindful of her food choices. She is walking and doing yard chores for activity.  Subjective:   1. Pre-diabetes Rebekah [name]'s last A1c was 5.8, and she is working on diet and weight loss. She is tolerating metformin well.  2. At risk for diabetes mellitus Rebekah [name] is at higher than average risk for developing diabetes due to her obesity.   Assessment/Plan:   1. Pre-diabetes Rebekah [name] will continue to work on weight loss, exercise, and decreasing simple carbohydrates to help decrease the risk of diabetes. We will refill metformin for 90 days with no refills, and we will recheck labs at her next visit.  - metFORMIN (GLUCOPHAGE) 500 MG tablet; Take 1 tablet (500 mg total) by mouth daily with breakfast.  Dispense: 90 tablet; Refill: 0  2. At risk for diabetes mellitus Rebekah [name] was given approximately 15 minutes of diabetes education and counseling today. We discussed intensive lifestyle modifications today with an emphasis on weight loss as well as increasing exercise and decreasing simple carbohydrates in her diet. We also reviewed medication options with an emphasis on risk versus benefit of those discussed.   Repetitive spaced learning was employed today to elicit superior memory formation and behavioral change.  3. Class 1  obesity with serious comorbidity and body mass index (BMI) of 31.0 to 31.9 in adult, unspecified obesity type Rebekah [name] is currently in the action stage of change. As such, her goal is to continue with weight loss efforts. She has agreed to practicing portion control and making smarter food choices, such as increasing vegetables and decreasing simple carbohydrates.   We will recheck fasting labs at her next visit.  Exercise goals: As is.  Behavioral modification strategies: increasing lean protein intake and decreasing simple carbohydrates.  Rebekah [name] has agreed to follow-up with our clinic in 3 weeks. She was informed of the importance of frequent follow-up visits to maximize her success with intensive lifestyle modifications for her multiple health conditions.   Objective:   Blood pressure 135/86, pulse 74, temperature (!) 97.5 F (36.4 C), height 5\' 4"  (1.626 m), weight 184 lb (83.5 kg), SpO2 98 %. Body mass index is 31.58 kg/m.  General: Cooperative, alert, well developed, in no acute distress. HEENT: Conjunctivae and lids unremarkable. Cardiovascular: Regular rhythm.  Lungs: Normal work of breathing. Neurologic: No focal deficits.   Lab Results  Component Value Date   CREATININE 0.82 02/23/2020   BUN 14 02/23/2020   NA 140 02/23/2020   K 4.4 02/23/2020   CL 105 02/23/2020   CO2 22 02/23/2020   Lab Results  Component Value Date   ALT 21 02/23/2020   AST 23 02/23/2020   ALKPHOS 76 02/23/2020   BILITOT <0.2 02/23/2020   Lab Results  Component Value Date   HGBA1C 5.8 (H) 03/16/2020   HGBA1C 6.0 (H) 11/09/2019  HGBA1C 5.9 (H) 05/04/2019   HGBA1C 5.8 (H) 10/30/2018   HGBA1C 6.3 07/10/2018   Lab Results  Component Value Date   INSULIN 24.6 03/16/2020   INSULIN 21.2 05/04/2019   INSULIN 25.1 (H) 10/30/2018   INSULIN 38.3 (H) 07/21/2018   Lab Results  Component Value Date   TSH 3.320 11/09/2019   Lab Results  Component Value Date   CHOL 128 03/16/2020   HDL 54  03/16/2020   LDLCALC 58 03/16/2020   TRIG 79 03/16/2020   CHOLHDL 2 07/10/2018   Lab Results  Component Value Date   WBC 8.0 02/23/2020   HGB 12.4 02/23/2020   HCT 38.2 02/23/2020   MCV 86 02/23/2020   PLT 310 02/23/2020   Lab Results  Component Value Date   IRON 86 01/07/2015   FERRITIN 19.1 01/07/2015   Attestation Statements:   Reviewed by clinician on day of visit: allergies, medications, problem list, medical history, surgical history, family history, social history, and previous encounter notes.   I, Trixie Dredge, am acting as transcriptionist for Dennard Nip, MD.  I have reviewed the above documentation for accuracy and completeness, and I agree with the above. -  Dennard Nip, MD

## 2020-09-19 ENCOUNTER — Encounter (INDEPENDENT_AMBULATORY_CARE_PROVIDER_SITE_OTHER): Payer: Self-pay | Admitting: Family Medicine

## 2020-09-19 ENCOUNTER — Other Ambulatory Visit: Payer: Self-pay

## 2020-09-19 ENCOUNTER — Ambulatory Visit (INDEPENDENT_AMBULATORY_CARE_PROVIDER_SITE_OTHER): Payer: No Typology Code available for payment source | Admitting: Family Medicine

## 2020-09-19 VITALS — BP 156/93 | HR 73 | Temp 97.5°F | Ht 64.0 in | Wt 186.0 lb

## 2020-09-19 DIAGNOSIS — I10 Essential (primary) hypertension: Secondary | ICD-10-CM

## 2020-09-19 DIAGNOSIS — E669 Obesity, unspecified: Secondary | ICD-10-CM | POA: Diagnosis not present

## 2020-09-19 DIAGNOSIS — E559 Vitamin D deficiency, unspecified: Secondary | ICD-10-CM

## 2020-09-19 DIAGNOSIS — Z9189 Other specified personal risk factors, not elsewhere classified: Secondary | ICD-10-CM

## 2020-09-19 DIAGNOSIS — R7303 Prediabetes: Secondary | ICD-10-CM

## 2020-09-19 DIAGNOSIS — Z6832 Body mass index (BMI) 32.0-32.9, adult: Secondary | ICD-10-CM

## 2020-09-19 MED ORDER — HYDROCHLOROTHIAZIDE 12.5 MG PO CAPS: 12.5000 mg | ORAL_CAPSULE | Freq: Every day | ORAL | 0 refills | Status: DC

## 2020-09-26 ENCOUNTER — Other Ambulatory Visit: Payer: Self-pay | Admitting: Internal Medicine

## 2020-09-26 NOTE — Progress Notes (Signed)
Chief Complaint:   OBESITY Rebekah [name] is here to discuss her progress with her obesity treatment plan along with follow-up of her obesity related diagnoses. Rebekah [name] is on practicing portion control and making smarter food choices, such as increasing vegetables and decreasing simple carbohydrates and states she is following her eating plan approximately 60% of the time. Rebekah [name] states she is doing yard work for 30-60 minutes 3 times per week.  Today's visit was #: 61 Starting weight: 198 lbs Starting date: 07/21/2018 Today's weight: 186 lbs Today's date: 09/19/2020 Total lbs lost to date: 12 Total lbs lost since last in-office visit: 0  Interim History: Rebekah [name] has done very well avoiding weight gain over Thanksgiving. She will be doing some holiday baking, and she would like to discuss strategies to decreasing snacking.  Subjective:   1. Essential hypertension Rebekah [name]'s blood pressure has been borderline elevated recently. She is retaining a bit of fluid which may be due to increased Na+ over the holiday. She denies chest pain.  2. Vitamin D deficiency Rebekah [name] is on Vit D OTC, and last level was at goal. She is at high risk of over-replacement. She denies nausea or vomiting.  3. Pre-diabetes Rebekah [name] is working on diet, exercise, and weight loss. She is due to have labs done to check her progress.  4. At risk for heart disease Rebekah [name] is at a higher than average risk for cardiovascular disease due to obesity.   Assessment/Plan:   1. Essential hypertension Rebekah [name] is working on healthy weight loss, diet, and exercise to improve blood pressure control. We will watch for signs of hypotension as she continues her lifestyle modifications. Rebekah [name] agreed to start hydrochlorothiazide 12.5 mg q daily with no refills. We will recheck her blood pressure in 2 to 3 weeks.  - hydrochlorothiazide (MICROZIDE) 12.5 MG capsule; Take 1 capsule (12.5 mg total) by mouth daily.  Dispense: 30 capsule; Refill: 0  2.  Vitamin D deficiency Low Vitamin D level contributes to fatigue and are associated with obesity, breast, and colon cancer. We will check labs today. Rebekah [name] will follow-up for routine testing of Vitamin D, at least 2-3 times per year to avoid over-replacement.  - VITAMIN D 25 Hydroxy (Vit-D Deficiency, Fractures)  3. Pre-diabetes Rebekah [name] will continue to work on weight loss, diet, exercise, and decreasing simple carbohydrates to help decrease the risk of diabetes. We will check labs today.  - Comprehensive metabolic panel - Hemoglobin A1c - Insulin, random  4. At risk for heart disease Rebekah [name] was given approximately 15 minutes of coronary artery disease prevention counseling today. She is 59 y.o. female and has risk factors for heart disease including obesity. We discussed intensive lifestyle modifications today with an emphasis on specific weight loss instructions and strategies.   Repetitive spaced learning was employed today to elicit superior memory formation and behavioral change.  5. Class 1 obesity with serious comorbidity and body mass index (BMI) of 32.0 to 32.9 in adult, unspecified obesity type Rebekah [name] is currently in the action stage of change. As such, her goal is to continue with weight loss efforts. She has agreed to the Category 2 Plan.   Exercise goals: As is.  Behavioral modification strategies: ways to avoid boredom eating and holiday eating strategies .  Rebekah [name] has agreed to follow-up with our clinic in 2 to 3 weeks. She was informed of the importance of frequent follow-up visits to maximize her success with intensive lifestyle modifications for her multiple health conditions.   Rebekah [name] was informed  we would discuss her lab results at her next visit unless there is a critical issue that needs to be addressed sooner. Rebekah [name] agreed to keep her next visit at the agreed upon time to discuss these results.  Objective:   Blood pressure (!) 156/93, pulse 73, temperature (!) 97.5 F  (36.4 C), height 5\' 4"  (1.626 m), weight 186 lb (84.4 kg), SpO2 97 %. Body mass index is 31.93 kg/m.  General: Cooperative, alert, well developed, in no acute distress. HEENT: Conjunctivae and lids unremarkable. Cardiovascular: Regular rhythm.  Lungs: Normal work of breathing. Neurologic: No focal deficits.   Lab Results  Component Value Date   CREATININE 0.82 02/23/2020   BUN 14 02/23/2020   NA 140 02/23/2020   K 4.4 02/23/2020   CL 105 02/23/2020   CO2 22 02/23/2020   Lab Results  Component Value Date   ALT 21 02/23/2020   AST 23 02/23/2020   ALKPHOS 76 02/23/2020   BILITOT <0.2 02/23/2020   Lab Results  Component Value Date   HGBA1C 5.8 (H) 03/16/2020   HGBA1C 6.0 (H) 11/09/2019   HGBA1C 5.9 (H) 05/04/2019   HGBA1C 5.8 (H) 10/30/2018   HGBA1C 6.3 07/10/2018   Lab Results  Component Value Date   INSULIN 24.6 03/16/2020   INSULIN 21.2 05/04/2019   INSULIN 25.1 (H) 10/30/2018   INSULIN 38.3 (H) 07/21/2018   Lab Results  Component Value Date   TSH 3.320 11/09/2019   Lab Results  Component Value Date   CHOL 128 03/16/2020   HDL 54 03/16/2020   LDLCALC 58 03/16/2020   TRIG 79 03/16/2020   CHOLHDL 2 07/10/2018   Lab Results  Component Value Date   WBC 8.0 02/23/2020   HGB 12.4 02/23/2020   HCT 38.2 02/23/2020   MCV 86 02/23/2020   PLT 310 02/23/2020   Lab Results  Component Value Date   IRON 86 01/07/2015   FERRITIN 19.1 01/07/2015   Attestation Statements:   Reviewed by clinician on day of visit: allergies, medications, problem list, medical history, surgical history, family history, social history, and previous encounter notes.   I, Trixie Dredge, am acting as transcriptionist for Dennard Nip, MD.  I have reviewed the above documentation for accuracy and completeness, and I agree with the above. -  Dennard Nip, MD

## 2020-09-30 LAB — COMPREHENSIVE METABOLIC PANEL
ALT: 25 IU/L (ref 0–32)
AST: 22 IU/L (ref 0–40)
Albumin/Globulin Ratio: 1.3 (ref 1.2–2.2)
Albumin: 4.1 g/dL (ref 3.8–4.9)
Alkaline Phosphatase: 83 IU/L (ref 44–121)
BUN/Creatinine Ratio: 15 (ref 9–23)
BUN: 13 mg/dL (ref 6–24)
Bilirubin Total: <0.2 mg/dL (ref 0.0–1.2)
CO2: 23 mmol/L (ref 20–29)
Calcium: 9.1 mg/dL (ref 8.7–10.2)
Chloride: 102 mmol/L (ref 96–106)
Creatinine, Ser: 0.85 mg/dL (ref 0.57–1.00)
GFR calc Af Amer: 87 mL/min/{1.73_m2} (ref >59)
GFR calc non Af Amer: 76 mL/min/{1.73_m2} (ref >59)
Globulin, Total: 3.1 g/dL (ref 1.5–4.5)
Glucose: 86 mg/dL (ref 65–99)
Potassium: 4.7 mmol/L (ref 3.5–5.2)
Sodium: 138 mmol/L (ref 134–144)
Total Protein: 7.2 g/dL (ref 6.0–8.5)

## 2020-09-30 LAB — HEMOGLOBIN A1C
Est. average glucose Bld gHb Est-mCnc: 123 mg/dL
Hgb A1c MFr Bld: 5.9 % — ABNORMAL HIGH (ref 4.8–5.6)

## 2020-09-30 LAB — VITAMIN D 25 HYDROXY (VIT D DEFICIENCY, FRACTURES): Vit D, 25-Hydroxy: 44 ng/mL (ref 30.0–100.0)

## 2020-09-30 LAB — INSULIN, RANDOM: INSULIN: 34.2 u[IU]/mL — ABNORMAL HIGH (ref 2.6–24.9)

## 2020-10-10 ENCOUNTER — Ambulatory Visit (INDEPENDENT_AMBULATORY_CARE_PROVIDER_SITE_OTHER): Payer: No Typology Code available for payment source | Admitting: Family Medicine

## 2020-10-10 ENCOUNTER — Telehealth (INDEPENDENT_AMBULATORY_CARE_PROVIDER_SITE_OTHER): Payer: Self-pay

## 2020-10-10 NOTE — Telephone Encounter (Signed)
PA has been initiated via [email or web address] for Saxenda 18mg /51mL.  Ermalene Searing (Key: [id number]) Saxenda 18MG Fayne Mediate pen-injectors   Form Charity fundraiser PA Form (2017 NCPDP) Created 6 minutes ago Sent to Plan 4 minutes ago Plan Response 4 minutes ago Submit Clinical Questions less than a minute ago Determination Wait for Determination Please wait for [employer] N[employer] 2017 to return a determination.

## 2020-10-11 NOTE — Telephone Encounter (Signed)
[facility] [employer] indicated via fax that as long as pt remains covered under current prescription drug plan, and there are no changes to benefits, this PA is approved for the following dates: 10/10/20-02/08/2021

## 2020-10-12 ENCOUNTER — Ambulatory Visit
Admission: RE | Admit: 2020-10-12 | Discharge: 2020-10-12 | Disposition: A | Discharge status: Home / Self Care | Payer: No Typology Code available for payment source | Source: Ambulatory Visit | Attending: Obstetrics and Gynecology | Admitting: Obstetrics and Gynecology

## 2020-10-12 ENCOUNTER — Other Ambulatory Visit (INDEPENDENT_AMBULATORY_CARE_PROVIDER_SITE_OTHER): Payer: Self-pay | Admitting: Family Medicine

## 2020-10-12 ENCOUNTER — Encounter (INDEPENDENT_AMBULATORY_CARE_PROVIDER_SITE_OTHER): Payer: Self-pay

## 2020-10-12 ENCOUNTER — Other Ambulatory Visit: Payer: Self-pay

## 2020-10-12 DIAGNOSIS — M8580 Other specified disorders of bone density and structure, unspecified site: Secondary | ICD-10-CM

## 2020-10-12 DIAGNOSIS — I10 Essential (primary) hypertension: Secondary | ICD-10-CM

## 2020-10-12 NOTE — Telephone Encounter (Signed)
MyChart message sent to pt to find out if they have enough medication to get them through until next appt.   

## 2020-10-15 ENCOUNTER — Other Ambulatory Visit (INDEPENDENT_AMBULATORY_CARE_PROVIDER_SITE_OTHER): Payer: Self-pay | Admitting: Family Medicine

## 2020-10-15 DIAGNOSIS — R7303 Prediabetes: Secondary | ICD-10-CM

## 2020-10-15 DIAGNOSIS — E669 Obesity, unspecified: Secondary | ICD-10-CM

## 2020-10-15 DIAGNOSIS — E559 Vitamin D deficiency, unspecified: Secondary | ICD-10-CM

## 2020-10-17 ENCOUNTER — Encounter (INDEPENDENT_AMBULATORY_CARE_PROVIDER_SITE_OTHER): Payer: Self-pay

## 2020-10-17 NOTE — Telephone Encounter (Signed)
Message sent to pt.

## 2020-10-18 ENCOUNTER — Other Ambulatory Visit: Payer: Self-pay

## 2020-10-18 MED ORDER — ALENDRONATE SODIUM 70 MG PO TABS: 70.0000 mg | ORAL_TABLET | ORAL | 3 refills | Status: DC

## 2020-10-26 ENCOUNTER — Other Ambulatory Visit: Payer: Self-pay

## 2020-10-26 ENCOUNTER — Ambulatory Visit (INDEPENDENT_AMBULATORY_CARE_PROVIDER_SITE_OTHER): Payer: No Typology Code available for payment source | Admitting: Family Medicine

## 2020-10-26 ENCOUNTER — Encounter (INDEPENDENT_AMBULATORY_CARE_PROVIDER_SITE_OTHER): Payer: Self-pay | Admitting: Family Medicine

## 2020-10-26 VITALS — BP 142/84 | HR 88 | Temp 97.9°F | Ht 64.0 in | Wt 190.0 lb

## 2020-10-26 DIAGNOSIS — R7303 Prediabetes: Secondary | ICD-10-CM

## 2020-10-26 DIAGNOSIS — I10 Essential (primary) hypertension: Secondary | ICD-10-CM

## 2020-10-26 DIAGNOSIS — Z9189 Other specified personal risk factors, not elsewhere classified: Secondary | ICD-10-CM

## 2020-10-26 DIAGNOSIS — E559 Vitamin D deficiency, unspecified: Secondary | ICD-10-CM

## 2020-10-26 DIAGNOSIS — E669 Obesity, unspecified: Secondary | ICD-10-CM | POA: Diagnosis not present

## 2020-10-26 DIAGNOSIS — Z6832 Body mass index (BMI) 32.0-32.9, adult: Secondary | ICD-10-CM

## 2020-10-26 MED ORDER — ERGOCALCIFEROL 1.25 MG (50000 UT) PO CAPS: 50000.0000 [IU] | ORAL_CAPSULE | ORAL | 0 refills | Status: DC

## 2020-10-26 MED ORDER — HYDROCHLOROTHIAZIDE 12.5 MG PO CAPS: 12.5000 mg | ORAL_CAPSULE | Freq: Every day | ORAL | 0 refills | Status: DC

## 2020-10-26 MED ORDER — METFORMIN HCL 500 MG PO TABS: 500.0000 mg | ORAL_TABLET | Freq: Every day | ORAL | 0 refills | Status: DC

## 2020-10-31 NOTE — Progress Notes (Signed)
Chief Complaint:   OBESITY Rebekah [name] is here to discuss her progress with her obesity treatment plan along with follow-up of her obesity related diagnoses. Rebekah [name] is on the Category 2 Plan and states she is following her eating plan approximately 0% of the time. Rebekah [name] states she is active while doing normal life activities.  Today's visit was #: 24 Starting weight: 198 lbs Starting date: 07/21/2018 Today's weight: 190 lbs Today's date: 10/26/2020 Total lbs lost to date: 8 Total lbs lost since last in-office visit: 0  Interim History: [name] did some celebration eating over the holidays, and she has gained weight but it is mostly water weight. She is ready to get back on track and would like to go back to her Category 2 plan.  Subjective:   1. Vitamin D deficiency Rebekah [name] has osteoporosis and she is now on Fosamax. She is on OTC Vit D plus calcium but her Vit D level is not at goal.  2. Pre-diabetes Rebekah [name] is stable on metformin, and she is working on diet. She requests a refill today.  3. Essential hypertension Rebekah [name]'s blood pressure is a bit elevated today. She has been out of her medications for a few days.  4. At high risk for fluid overload Rebekah [name] is at a higher than average risk for fluid retention due to being off hydrochlorothiazide.  Assessment/Plan:   1. Vitamin D deficiency Low Vitamin D level contributes to fatigue and are associated with obesity, breast, and colon cancer. Rebekah [name] agreed to restart prescription Vitamin D 50,000 IU every week with no refills. She will follow-up for routine testing of Vitamin D, at least 2-3 times per year to avoid over-replacement.  - ergocalciferol (VITAMIN D2) 1.25 MG (50000 UT) capsule; Take 1 capsule (50,000 Units total) by mouth once a week.  Dispense: 4 capsule; Refill: 0  2. Pre-diabetes Rebekah [name] will continue to work on weight loss, exercise, and decreasing simple carbohydrates to help decrease the risk of diabetes. We will refill  metformin for 90 days with no refills.  - metFORMIN (GLUCOPHAGE) 500 MG tablet; Take 1 tablet (500 mg total) by mouth daily with breakfast.  Dispense: 90 tablet; Refill: 0  3. Essential hypertension Rebekah [name] is working on healthy weight loss and exercise to improve blood pressure control. We will watch for signs of hypotension as she continues her lifestyle modifications. We will refill hydrochlorothiazide for 1 month.  - hydrochlorothiazide (MICROZIDE) 12.5 MG capsule; Take 1 capsule (12.5 mg total) by mouth daily.  Dispense: 30 capsule; Refill: 0  4. At high risk for fluid overload Rebekah [name] was given approximately 15 minutes of fluid retention prevention counseling today. She is 60 y.o. female and has risk factors for fluid retention including obesity. We discussed intensive lifestyle modifications today with an emphasis on specific weight loss instructions, proper nutrition and exercise strategies. Rebekah [name] agreed to restart hydrochlorothiazide and will continue to follow up.  Repetitive spaced learning was employed today to elicit superior memory formation and behavioral change.  5. Class 1 obesity with serious comorbidity and body mass index (BMI) of 32.0 to 32.9 in adult, unspecified obesity type Rebekah [name] is currently in the action stage of change. As such, her goal is to continue with weight loss efforts. She has agreed to the Category 2 Plan.   Exercise goals: As is.  Behavioral modification strategies: meal planning and cooking strategies.  Rebekah [name] has agreed to follow-up with our clinic in 2 weeks. She was informed of the importance of frequent follow-up visits  to maximize her success with intensive lifestyle modifications for her multiple health conditions.   Objective:   Blood pressure (!) 142/84, pulse 88, temperature 97.9 F (36.6 C), height 5\' 4"  (1.626 m), weight 190 lb (86.2 kg), SpO2 97 %. Body mass index is 32.61 kg/m.  General: Cooperative, alert, well developed, in no acute  distress. HEENT: Conjunctivae and lids unremarkable. Cardiovascular: Regular rhythm.  Lungs: Normal work of breathing. Neurologic: No focal deficits.   Lab Results  Component Value Date   CREATININE 0.85 09/29/2020   BUN 13 09/29/2020   NA 138 09/29/2020   K 4.7 09/29/2020   CL 102 09/29/2020   CO2 23 09/29/2020   Lab Results  Component Value Date   ALT 25 09/29/2020   AST 22 09/29/2020   ALKPHOS 83 09/29/2020   BILITOT <0.2 09/29/2020   Lab Results  Component Value Date   HGBA1C 5.9 (H) 09/29/2020   HGBA1C 5.8 (H) 03/16/2020   HGBA1C 6.0 (H) 11/09/2019   HGBA1C 5.9 (H) 05/04/2019   HGBA1C 5.8 (H) 10/30/2018   Lab Results  Component Value Date   INSULIN 34.2 (H) 09/29/2020   INSULIN 24.6 03/16/2020   INSULIN 21.2 05/04/2019   INSULIN 25.1 (H) 10/30/2018   INSULIN 38.3 (H) 07/21/2018   Lab Results  Component Value Date   TSH 3.320 11/09/2019   Lab Results  Component Value Date   CHOL 128 03/16/2020   HDL 54 03/16/2020   LDLCALC 58 03/16/2020   TRIG 79 03/16/2020   CHOLHDL 2 07/10/2018   Lab Results  Component Value Date   WBC 8.0 02/23/2020   HGB 12.4 02/23/2020   HCT 38.2 02/23/2020   MCV 86 02/23/2020   PLT 310 02/23/2020   Lab Results  Component Value Date   IRON 86 01/07/2015   FERRITIN 19.1 01/07/2015   Attestation Statements:   Reviewed by clinician on day of visit: allergies, medications, problem list, medical history, surgical history, family history, social history, and previous encounter notes.   I, Trixie Dredge, am acting as transcriptionist for Dennard Nip, MD.  I have reviewed the above documentation for accuracy and completeness, and I agree with the above. -  Dennard Nip, MD

## 2020-11-02 ENCOUNTER — Other Ambulatory Visit: Payer: Self-pay | Admitting: Internal Medicine

## 2020-11-08 ENCOUNTER — Encounter (INDEPENDENT_AMBULATORY_CARE_PROVIDER_SITE_OTHER): Payer: Self-pay | Admitting: Family Medicine

## 2020-11-09 ENCOUNTER — Ambulatory Visit (INDEPENDENT_AMBULATORY_CARE_PROVIDER_SITE_OTHER): Payer: No Typology Code available for payment source | Admitting: Family Medicine

## 2020-11-16 ENCOUNTER — Other Ambulatory Visit (INDEPENDENT_AMBULATORY_CARE_PROVIDER_SITE_OTHER): Payer: Self-pay | Admitting: Family Medicine

## 2020-11-16 ENCOUNTER — Encounter (INDEPENDENT_AMBULATORY_CARE_PROVIDER_SITE_OTHER): Payer: Self-pay

## 2020-11-16 DIAGNOSIS — E559 Vitamin D deficiency, unspecified: Secondary | ICD-10-CM

## 2020-11-16 NOTE — Telephone Encounter (Signed)
MyChart message sent to pt to find out if they have enough medication to get them through until next appt.   

## 2020-11-18 ENCOUNTER — Other Ambulatory Visit (INDEPENDENT_AMBULATORY_CARE_PROVIDER_SITE_OTHER): Payer: Self-pay | Admitting: Family Medicine

## 2020-11-18 DIAGNOSIS — I10 Essential (primary) hypertension: Secondary | ICD-10-CM

## 2020-11-22 ENCOUNTER — Other Ambulatory Visit: Payer: Self-pay

## 2020-11-22 ENCOUNTER — Encounter (INDEPENDENT_AMBULATORY_CARE_PROVIDER_SITE_OTHER): Payer: Self-pay | Admitting: Family Medicine

## 2020-11-22 ENCOUNTER — Ambulatory Visit (INDEPENDENT_AMBULATORY_CARE_PROVIDER_SITE_OTHER): Payer: No Typology Code available for payment source | Admitting: Family Medicine

## 2020-11-22 VITALS — BP 152/84 | HR 94 | Temp 97.7°F | Ht 64.0 in | Wt 190.0 lb

## 2020-11-22 DIAGNOSIS — E669 Obesity, unspecified: Secondary | ICD-10-CM

## 2020-11-22 DIAGNOSIS — E559 Vitamin D deficiency, unspecified: Secondary | ICD-10-CM

## 2020-11-22 DIAGNOSIS — I10 Essential (primary) hypertension: Secondary | ICD-10-CM | POA: Diagnosis not present

## 2020-11-22 DIAGNOSIS — Z6832 Body mass index (BMI) 32.0-32.9, adult: Secondary | ICD-10-CM

## 2020-11-22 DIAGNOSIS — Z9189 Other specified personal risk factors, not elsewhere classified: Secondary | ICD-10-CM | POA: Diagnosis not present

## 2020-11-22 DIAGNOSIS — R7303 Prediabetes: Secondary | ICD-10-CM | POA: Diagnosis not present

## 2020-11-22 MED ORDER — METFORMIN HCL 500 MG PO TABS: 500.0000 mg | ORAL_TABLET | Freq: Every day | ORAL | 0 refills | Status: DC

## 2020-11-22 MED ORDER — HYDROCHLOROTHIAZIDE 12.5 MG PO CAPS: 12.5000 mg | ORAL_CAPSULE | Freq: Every day | ORAL | 0 refills | Status: DC

## 2020-11-22 MED ORDER — ERGOCALCIFEROL 1.25 MG (50000 UT) PO CAPS
50000.0000 [IU] | ORAL_CAPSULE | ORAL | 0 refills | Status: DC
Start: 2020-11-22 — End: 2021-02-09

## 2020-11-23 ENCOUNTER — Ambulatory Visit (INDEPENDENT_AMBULATORY_CARE_PROVIDER_SITE_OTHER): Payer: No Typology Code available for payment source | Admitting: Family Medicine

## 2020-11-23 NOTE — Progress Notes (Signed)
Chief Complaint:   OBESITY Rebekah [name] is here to discuss her progress with her obesity treatment plan along with follow-up of her obesity related diagnoses. Rebekah [name] is on the Category 2 Plan and states she is following her eating plan approximately 50% of the time. Rebekah [name] states she is doing 0 minutes 0 times per week.  Today's visit was #: 61 Starting weight: 198 lbs Starting date: 07/21/2018 Today's weight: 190 lbs Today's date: 11/22/2020 Total lbs lost to date: 8 Total lbs lost since last in-office visit: 0  Interim History: Rebekah [name] has done well with maintaining her weight even with some extra challenges. She might not always meet her protein goals however, which could decrease her RMR.  Subjective:   1. Essential hypertension Rebekah [name]'s blood pressure is elevated today. She has been out of her medications for a couple of days. Her blood pressure is normally well controlled.  2. Pre-diabetes Rebekah [name] is stable on metformin, and she is doing well with diet and weight loss.  3. Vitamin D deficiency Rebekah [name] is stable on Vit D, and she requests a refill today.  4. At risk for heart disease Rebekah [name] is at a higher than average risk for cardiovascular disease due to obesity.   Assessment/Plan:   1. Essential hypertension Rebekah [name] is working on healthy weight loss and exercise to improve blood pressure control. We will watch for signs of hypotension as she continues her lifestyle modifications. We will refill hydrochlorothiazide for 90 days with no refills.  - hydrochlorothiazide (MICROZIDE) 12.5 MG capsule; Take 1 capsule (12.5 mg total) by mouth daily.  Dispense: 90 capsule; Refill: 0  2. Pre-diabetes Rebekah [name] will continue to work on weight loss, exercise, and decreasing simple carbohydrates to help decrease the risk of diabetes. We will refill metformin for 90 days with no refills.  - metFORMIN (GLUCOPHAGE) 500 MG tablet; Take 1 tablet (500 mg total) by mouth daily with breakfast.  Dispense: 90  tablet; Refill: 0  3. Vitamin D deficiency Low Vitamin D level contributes to fatigue and are associated with obesity, breast, and colon cancer. We will refill prescription Vitamin D for 90 days with no refills. Rebekah [name] will follow-up for routine testing of Vitamin D, at least 2-3 times per year to avoid over-replacement.  - ergocalciferol (VITAMIN D2) 1.25 MG (50000 UT) capsule; Take 1 capsule (50,000 Units total) by mouth once a week.  Dispense: 12 capsule; Refill: 0  4. At risk for heart disease Rebekah [name] was given approximately 15 minutes of coronary artery disease prevention counseling today. She is 60 y.o. female and has risk factors for heart disease including obesity. We discussed intensive lifestyle modifications today with an emphasis on specific weight loss instructions and strategies.   Repetitive spaced learning was employed today to elicit superior memory formation and behavioral change.  5. Class 1 obesity with serious comorbidity and body mass index (BMI) of 32.0 to 32.9 in adult, unspecified obesity type Rebekah [name] is currently in the action stage of change. As such, her goal is to continue with weight loss efforts. She has agreed to the Category 2 Plan.   Behavioral modification strategies: increasing lean protein intake.  Rebekah [name] has agreed to follow-up with our clinic in 2 weeks. She was informed of the importance of frequent follow-up visits to maximize her success with intensive lifestyle modifications for her multiple health conditions.   Objective:   Blood pressure (!) 152/84, pulse 94, temperature 97.7 F (36.5 C), height 5\' 4"  (1.626 m), weight 190 lb (86.2 kg),  SpO2 97 %. Body mass index is 32.61 kg/m.  General: Cooperative, alert, well developed, in no acute distress. HEENT: Conjunctivae and lids unremarkable. Cardiovascular: Regular rhythm.  Lungs: Normal work of breathing. Neurologic: No focal deficits.   Lab Results  Component Value Date   CREATININE 0.85  09/29/2020   BUN 13 09/29/2020   NA 138 09/29/2020   K 4.7 09/29/2020   CL 102 09/29/2020   CO2 23 09/29/2020   Lab Results  Component Value Date   ALT 25 09/29/2020   AST 22 09/29/2020   ALKPHOS 83 09/29/2020   BILITOT <0.2 09/29/2020   Lab Results  Component Value Date   HGBA1C 5.9 (H) 09/29/2020   HGBA1C 5.8 (H) 03/16/2020   HGBA1C 6.0 (H) 11/09/2019   HGBA1C 5.9 (H) 05/04/2019   HGBA1C 5.8 (H) 10/30/2018   Lab Results  Component Value Date   INSULIN 34.2 (H) 09/29/2020   INSULIN 24.6 03/16/2020   INSULIN 21.2 05/04/2019   INSULIN 25.1 (H) 10/30/2018   INSULIN 38.3 (H) 07/21/2018   Lab Results  Component Value Date   TSH 3.320 11/09/2019   Lab Results  Component Value Date   CHOL 128 03/16/2020   HDL 54 03/16/2020   LDLCALC 58 03/16/2020   TRIG 79 03/16/2020   CHOLHDL 2 07/10/2018   Lab Results  Component Value Date   WBC 8.0 02/23/2020   HGB 12.4 02/23/2020   HCT 38.2 02/23/2020   MCV 86 02/23/2020   PLT 310 02/23/2020   Lab Results  Component Value Date   IRON 86 01/07/2015   FERRITIN 19.1 01/07/2015   Attestation Statements:   Reviewed by clinician on day of visit: allergies, medications, problem list, medical history, surgical history, family history, social history, and previous encounter notes.   I, Trixie Dredge, am acting as transcriptionist for Dennard Nip, MD.  I have reviewed the above documentation for accuracy and completeness, and I agree with the above. -  Dennard Nip, MD

## 2020-12-02 ENCOUNTER — Other Ambulatory Visit: Payer: Self-pay

## 2020-12-02 ENCOUNTER — Encounter: Payer: Self-pay | Admitting: Family Medicine

## 2020-12-02 ENCOUNTER — Ambulatory Visit (INDEPENDENT_AMBULATORY_CARE_PROVIDER_SITE_OTHER): Payer: No Typology Code available for payment source | Admitting: Family Medicine

## 2020-12-02 ENCOUNTER — Ambulatory Visit (HOSPITAL_BASED_OUTPATIENT_CLINIC_OR_DEPARTMENT_OTHER)
Admission: RE | Admit: 2020-12-02 | Discharge: 2020-12-02 | Disposition: A | Discharge status: Home / Self Care | Payer: No Typology Code available for payment source | Source: Ambulatory Visit | Attending: Family Medicine | Admitting: Family Medicine

## 2020-12-02 VITALS — BP 152/90 | HR 91 | Temp 99.2°F | Ht 64.0 in | Wt 194.5 lb

## 2020-12-02 DIAGNOSIS — S6991XA Unspecified injury of right wrist, hand and finger(s), initial encounter: Secondary | ICD-10-CM

## 2020-12-02 NOTE — Patient Instructions (Signed)
No heavy lifting for 4-6 weeks or until this heals.  Ice/cold pack over area for 10-15 min twice daily.  OK to take Tylenol 1000 mg (2 extra strength tabs) or 975 mg (3 regular strength tabs) every 6 hours as needed.  Ibuprofen 400-600 mg (2-3 over the counter strength tabs) every 6 hours as needed for pain.  We will be in touch regarding your X-ray.  Let us know if you need anything.

## 2020-12-02 NOTE — Progress Notes (Signed)
Musculoskeletal Exam  Patient: Rebekah [name] DOB: 1961-02-19  DOS: 12/02/2020  SUBJECTIVE:  Chief Complaint:   Chief Complaint  Patient presents with  . Hand Pain    Right ring finger     Rebekah [name] is a 60 y.o.  female for evaluation and treatment of R ring finger pain.   Onset:  1 day ago. Wrapped up finger in leash and got twisted Location: R ring finger Character:  aching  Progression of issue:  is unchanged Associated symptoms: swelling, bruising, decreased ROM Treatment: to date has been none.   Neurovascular symptoms: no  Past Medical History:  Diagnosis Date  . Allergy   . Back pain   . Bowel obstruction ([place]) 2019  . Depression   . Gall bladder stones   . GERD (gastroesophageal reflux disease)   . Hyperlipidemia   . Hypertension   . IBS (irritable bowel syndrome)   . IBS (irritable bowel syndrome)   . Joint pain   . Obesity    Lap band surgery 12-08 Dr Hassell Done  . OSA (obstructive sleep apnea)    using a CPAP    . Pre-diabetes   . REDUCTION MAMMOPLASTY, HX OF 09/22/2007   Qualifier: Diagnosis of  By: Larose Kells MD, Alda Berthold.     Objective: VITAL SIGNS: BP (!) 152/90 (BP Location: Left Arm, Patient Position: Sitting, Cuff Size: Normal)   Pulse 91   Temp 99.2 F (37.3 C) (Oral)   Ht 5\' 4"  (1.626 m)   Wt 194 lb 8 oz (88.2 kg)   SpO2 97%   BMI 33.39 kg/m  Constitutional: Well formed, well developed. No acute distress. Thorax & Lungs: No accessory muscle use Musculoskeletal: Right ring finger.   Normal active range of motion: no.   Normal passive range of motion: no Tenderness to palpation: Yes, distal middle phalanx and proximal distal phalanx Deformity: Soft tissue swelling noted, no bony deformity Ecchymosis: yes Neurologic: Normal sensory function.  Psychiatric: Normal mood. Age appropriate judgment and insight. Alert & oriented x 3.    Assessment:  Injury of finger of right hand, initial encounter - Plan: DG Finger Ring Right  Plan: NSAIDs,  ice, Tylenol.  Patient in pain today, blood pressure noted. X-ray unofficially shows no acute fractures or dislocations.  Await official read. F/u as originally scheduled. The patient voiced understanding and agreement to the plan.   [place], DO 12/02/20  2:59 PM

## 2020-12-06 ENCOUNTER — Ambulatory Visit (INDEPENDENT_AMBULATORY_CARE_PROVIDER_SITE_OTHER): Payer: No Typology Code available for payment source | Admitting: Family Medicine

## 2020-12-20 ENCOUNTER — Other Ambulatory Visit: Payer: Self-pay

## 2020-12-20 ENCOUNTER — Ambulatory Visit (INDEPENDENT_AMBULATORY_CARE_PROVIDER_SITE_OTHER): Payer: No Typology Code available for payment source | Admitting: Family Medicine

## 2020-12-20 ENCOUNTER — Encounter (INDEPENDENT_AMBULATORY_CARE_PROVIDER_SITE_OTHER): Payer: Self-pay | Admitting: Family Medicine

## 2020-12-20 VITALS — BP 135/85 | HR 77 | Temp 97.8°F | Ht 64.0 in | Wt 187.0 lb

## 2020-12-20 DIAGNOSIS — E669 Obesity, unspecified: Secondary | ICD-10-CM

## 2020-12-20 DIAGNOSIS — Z6832 Body mass index (BMI) 32.0-32.9, adult: Secondary | ICD-10-CM | POA: Diagnosis not present

## 2020-12-20 DIAGNOSIS — F3289 Other specified depressive episodes: Secondary | ICD-10-CM

## 2020-12-20 NOTE — Progress Notes (Signed)
Chief Complaint:   OBESITY Chrysten is here to discuss her progress with her obesity treatment plan along with follow-up of her obesity related diagnoses. Shantoya is on the Category 2 Plan and states she is following her eating plan approximately 50% of the time. Eli states she is walking the dog for 15 minutes 4 times per week.  Today's visit was #: 37 Starting weight: 198 lbs Starting date: 07/21/2018 Today's weight: 187 lbs Today's date: 12/20/2020 Total lbs lost to date: 11 Total lbs lost since last in-office visit: 3  Interim History: [place] has done well with weight loss. She has been retaining water weight and this is decreased now. She is struggling with increased stress eating especially with sweets most of this year.  Subjective:   1. Other depression with emotional eating Grizel notes increased stress at work as she is getting ready to retire. She has increased cravings for sweets lately as well.  Assessment/Plan:   1. Other depression with emotional eating Emotional eating behaviors were discussed and strategies to decrease emotional eating were reviewed. Orders and follow up as documented in patient record.   2. Class 1 obesity with serious comorbidity and body mass index (BMI) of 32.0 to 32.9 in adult, unspecified obesity type Luberta is currently in the action stage of change. As such, her goal is to continue with weight loss efforts. She has agreed to the Category 2 Plan.   Exercise goals: As is.  Behavioral modification strategies: meal planning and cooking strategies and emotional eating strategies.  Tisha has agreed to follow-up with our clinic in 4 weeks. She was informed of the importance of frequent follow-up visits to maximize her success with intensive lifestyle modifications for her multiple health conditions.   Objective:   Blood pressure 135/85, pulse 77, temperature 97.8 F (36.6 C), height 5\' 4"  (1.626 m), weight 187 lb (84.8 kg), SpO2 98 %. Body mass  index is 32.1 kg/m.  General: Cooperative, alert, well developed, in no acute distress. HEENT: Conjunctivae and lids unremarkable. Cardiovascular: Regular rhythm.  Lungs: Normal work of breathing. Neurologic: No focal deficits.   Lab Results  Component Value Date   CREATININE 0.85 09/29/2020   BUN 13 09/29/2020   NA 138 09/29/2020   K 4.7 09/29/2020   CL 102 09/29/2020   CO2 23 09/29/2020   Lab Results  Component Value Date   ALT 25 09/29/2020   AST 22 09/29/2020   ALKPHOS 83 09/29/2020   BILITOT <0.2 09/29/2020   Lab Results  Component Value Date   HGBA1C 5.9 (H) 09/29/2020   HGBA1C 5.8 (H) 03/16/2020   HGBA1C 6.0 (H) 11/09/2019   HGBA1C 5.9 (H) 05/04/2019   HGBA1C 5.8 (H) 10/30/2018   Lab Results  Component Value Date   INSULIN 34.2 (H) 09/29/2020   INSULIN 24.6 03/16/2020   INSULIN 21.2 05/04/2019   INSULIN 25.1 (H) 10/30/2018   INSULIN 38.3 (H) 07/21/2018   Lab Results  Component Value Date   TSH 3.320 11/09/2019   Lab Results  Component Value Date   CHOL 128 03/16/2020   HDL 54 03/16/2020   LDLCALC 58 03/16/2020   TRIG 79 03/16/2020   CHOLHDL 2 07/10/2018   Lab Results  Component Value Date   WBC 8.0 02/23/2020   HGB 12.4 02/23/2020   HCT 38.2 02/23/2020   MCV 86 02/23/2020   PLT 310 02/23/2020   Lab Results  Component Value Date   IRON 86 01/07/2015   FERRITIN 19.1 01/07/2015  Attestation Statements:   Reviewed by clinician on day of visit: allergies, medications, problem list, medical history, surgical history, family history, social history, and previous encounter notes.  Time spent on visit including pre-visit chart review and post-visit care and charting was 40 minutes.    I, Trixie Dredge, am acting as transcriptionist for Dennard Nip, MD.  I have reviewed the above documentation for accuracy and completeness, and I agree with the above. -  Dennard Nip, MD

## 2021-01-18 ENCOUNTER — Encounter: Payer: Self-pay | Admitting: Internal Medicine

## 2021-01-23 ENCOUNTER — Encounter (INDEPENDENT_AMBULATORY_CARE_PROVIDER_SITE_OTHER): Payer: Self-pay | Admitting: Family Medicine

## 2021-01-23 ENCOUNTER — Ambulatory Visit (INDEPENDENT_AMBULATORY_CARE_PROVIDER_SITE_OTHER): Payer: No Typology Code available for payment source | Admitting: Family Medicine

## 2021-01-23 ENCOUNTER — Other Ambulatory Visit: Payer: Self-pay

## 2021-01-23 VITALS — BP 135/85 | HR 79 | Temp 98.3°F | Ht 64.0 in | Wt 187.0 lb

## 2021-01-23 DIAGNOSIS — E88819 Insulin resistance, unspecified: Secondary | ICD-10-CM

## 2021-01-23 DIAGNOSIS — E669 Obesity, unspecified: Secondary | ICD-10-CM

## 2021-01-23 DIAGNOSIS — Z6833 Body mass index (BMI) 33.0-33.9, adult: Secondary | ICD-10-CM

## 2021-01-23 DIAGNOSIS — E8881 Metabolic syndrome: Secondary | ICD-10-CM | POA: Diagnosis not present

## 2021-01-24 ENCOUNTER — Encounter (INDEPENDENT_AMBULATORY_CARE_PROVIDER_SITE_OTHER): Payer: Self-pay | Admitting: Family Medicine

## 2021-01-24 ENCOUNTER — Other Ambulatory Visit (INDEPENDENT_AMBULATORY_CARE_PROVIDER_SITE_OTHER): Payer: Self-pay

## 2021-01-24 DIAGNOSIS — R7303 Prediabetes: Secondary | ICD-10-CM

## 2021-01-30 ENCOUNTER — Other Ambulatory Visit (INDEPENDENT_AMBULATORY_CARE_PROVIDER_SITE_OTHER): Payer: Self-pay

## 2021-01-30 MED ORDER — GLUCOSE BLOOD VI STRP: ORAL_STRIP | 0 refills | Status: DC

## 2021-01-30 MED ORDER — ONETOUCH DELICA LANCETS 33G MISC: 0 refills | Status: DC

## 2021-02-07 NOTE — Progress Notes (Signed)
Chief Complaint:   OBESITY Kyeshia is here to discuss her progress with her obesity treatment plan along with follow-up of her obesity related diagnoses. Matie is on the Category 2 Plan and states she is following her eating plan approximately 50% of the time. 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  Today's visit was #: 56 Starting weight: 198 lbs Starting date: 07/21/2018 Today's weight: 187 lbs Today's date: 01/23/2021 Total lbs lost to date: 11 Total lbs lost since last in-office visit: 0  Interim History: Maylani has done very well maintaining her weight even on vacation. She did some celebration eating, but she still made some better choices than she would have otherwise. She is looking forward to retiring in the next 2 months.  Subjective:   1. Insulin resistance Dynver continues to do well with diet and exercise. She is tolerating metformin well.  Assessment/Plan:   1. Insulin resistance Nathalya will continue her medications, diet, exercise, and decreasing simple carbohydrates to help decrease the risk of diabetes. We will recheck labs next month. Citlaly agreed to follow-up with [name] as directed to closely monitor her progress.  2. Obesity with current BMI of 32.2 Sheryle is currently in the action stage of change. As such, her goal is to continue with weight loss efforts. She has agreed to the Category 2 Plan.   We will recheck fasting labs at her next visit.  Exercise goals: As is.  Behavioral modification strategies: increasing lean protein intake and meal planning and cooking strategies.  Kimberely has agreed to follow-up with our clinic in 3 to 4 weeks. She was informed of the importance of frequent follow-up visits to maximize her success with intensive lifestyle modifications for her multiple health conditions.   Objective:   Blood pressure 135/85, pulse 79, temperature 98.3 F (36.8 C), height 5\' 4"  (1.626 m), weight 187 lb (84.8 kg), SpO2 98 %. Body  mass index is 32.1 kg/m.  General: Cooperative, alert, well developed, in no acute distress. HEENT: Conjunctivae and lids unremarkable. Cardiovascular: Regular rhythm.  Lungs: Normal work of breathing. Neurologic: No focal deficits.   Lab Results  Component Value Date   CREATININE 0.85 09/29/2020   BUN 13 09/29/2020   NA 138 09/29/2020   K 4.7 09/29/2020   CL 102 09/29/2020   CO2 23 09/29/2020   Lab Results  Component Value Date   ALT 25 09/29/2020   AST 22 09/29/2020   ALKPHOS 83 09/29/2020   BILITOT <0.2 09/29/2020   Lab Results  Component Value Date   HGBA1C 5.9 (H) 09/29/2020   HGBA1C 5.8 (H) 03/16/2020   HGBA1C 6.0 (H) 11/09/2019   HGBA1C 5.9 (H) 05/04/2019   HGBA1C 5.8 (H) 10/30/2018   Lab Results  Component Value Date   INSULIN 34.2 (H) 09/29/2020   INSULIN 24.6 03/16/2020   INSULIN 21.2 05/04/2019   INSULIN 25.1 (H) 10/30/2018   INSULIN 38.3 (H) 07/21/2018   Lab Results  Component Value Date   TSH 3.320 11/09/2019   Lab Results  Component Value Date   CHOL 128 03/16/2020   HDL 54 03/16/2020   LDLCALC 58 03/16/2020   TRIG 79 03/16/2020   CHOLHDL 2 07/10/2018   Lab Results  Component Value Date   WBC 8.0 02/23/2020   HGB 12.4 02/23/2020   HCT 38.2 02/23/2020   MCV 86 02/23/2020   PLT 310 02/23/2020   Lab Results  Component Value Date   IRON 86 01/07/2015  FERRITIN 19.1 01/07/2015   Attestation Statements:   Reviewed by clinician on day of visit: allergies, medications, problem list, medical history, surgical history, family history, social history, and previous encounter notes.  Time spent on visit including pre-visit chart review and post-visit care and charting was 20 minutes.    I, Trixie Dredge, am acting as transcriptionist for Dennard Nip, MD.  I have reviewed the above documentation for accuracy and completeness, and I agree with the above. -  Dennard Nip, MD

## 2021-02-08 ENCOUNTER — Telehealth (INDEPENDENT_AMBULATORY_CARE_PROVIDER_SITE_OTHER): Payer: Self-pay

## 2021-02-08 NOTE — Telephone Encounter (Signed)
PA for Kirke Shaggy has been initiated via [email or web address].   Key: [id number] Saxenda 18MG Fayne Mediate pen-injectors   Form: Charity fundraiser PA Form (2017 NCPDP) Determination: Wait for Determination Please wait for [employer] NCPDP 2017 to return a determination.  Your information has been submitted to [employer]. To check for an updated outcome later, reopen this PA request from your dashboard.  If [employer] has not responded to your request within 24 hours, contact [place] at [phone]. If you think there may be a problem with your PA request, use our live chat feature at the bottom right.

## 2021-02-09 ENCOUNTER — Other Ambulatory Visit (INDEPENDENT_AMBULATORY_CARE_PROVIDER_SITE_OTHER): Payer: Self-pay

## 2021-02-09 ENCOUNTER — Encounter (INDEPENDENT_AMBULATORY_CARE_PROVIDER_SITE_OTHER): Payer: Self-pay | Admitting: Family Medicine

## 2021-02-09 DIAGNOSIS — E559 Vitamin D deficiency, unspecified: Secondary | ICD-10-CM

## 2021-02-09 MED ORDER — ERGOCALCIFEROL 1.25 MG (50000 UT) PO CAPS: 50000.0000 [IU] | ORAL_CAPSULE | ORAL | 0 refills | Status: DC

## 2021-02-09 NOTE — Telephone Encounter (Signed)
PA for Kirke Shaggy has been approved.  Approval good from 02/09/21 through 06/11/2021.

## 2021-02-09 NOTE — Telephone Encounter (Signed)
Ok x 90 days  

## 2021-02-09 NOTE — Telephone Encounter (Signed)
Dr.Beasley 

## 2021-02-14 ENCOUNTER — Other Ambulatory Visit (INDEPENDENT_AMBULATORY_CARE_PROVIDER_SITE_OTHER): Payer: Self-pay | Admitting: Family Medicine

## 2021-02-14 DIAGNOSIS — I10 Essential (primary) hypertension: Secondary | ICD-10-CM

## 2021-02-14 NOTE — Telephone Encounter (Signed)
Dr.Beasley 

## 2021-02-15 NOTE — Telephone Encounter (Signed)
Ok x 1

## 2021-02-15 NOTE — Telephone Encounter (Signed)
Would you like to refill? 

## 2021-02-27 ENCOUNTER — Other Ambulatory Visit (INDEPENDENT_AMBULATORY_CARE_PROVIDER_SITE_OTHER): Payer: Self-pay | Admitting: Family Medicine

## 2021-02-27 ENCOUNTER — Telehealth (INDEPENDENT_AMBULATORY_CARE_PROVIDER_SITE_OTHER): Payer: No Typology Code available for payment source | Admitting: Family Medicine

## 2021-02-27 ENCOUNTER — Other Ambulatory Visit: Payer: Self-pay

## 2021-02-27 ENCOUNTER — Encounter (INDEPENDENT_AMBULATORY_CARE_PROVIDER_SITE_OTHER): Payer: Self-pay | Admitting: Family Medicine

## 2021-02-27 DIAGNOSIS — I10 Essential (primary) hypertension: Secondary | ICD-10-CM | POA: Diagnosis not present

## 2021-02-27 DIAGNOSIS — R7303 Prediabetes: Secondary | ICD-10-CM | POA: Diagnosis not present

## 2021-02-27 DIAGNOSIS — E669 Obesity, unspecified: Secondary | ICD-10-CM

## 2021-02-27 DIAGNOSIS — E7849 Other hyperlipidemia: Secondary | ICD-10-CM | POA: Diagnosis not present

## 2021-02-27 DIAGNOSIS — E559 Vitamin D deficiency, unspecified: Secondary | ICD-10-CM | POA: Diagnosis not present

## 2021-02-27 DIAGNOSIS — Z6833 Body mass index (BMI) 33.0-33.9, adult: Secondary | ICD-10-CM

## 2021-02-27 MED ORDER — GLUCOSE BLOOD VI STRP: ORAL_STRIP | 0 refills | Status: DC

## 2021-02-27 MED ORDER — ONETOUCH DELICA LANCETS 33G MISC: 0 refills | Status: DC

## 2021-02-27 MED ORDER — HYDROCHLOROTHIAZIDE 12.5 MG PO CAPS: ORAL_CAPSULE | ORAL | 0 refills | Status: DC

## 2021-02-27 MED ORDER — ERGOCALCIFEROL 1.25 MG (50000 UT) PO CAPS: 50000.0000 [IU] | ORAL_CAPSULE | ORAL | 0 refills | Status: DC

## 2021-02-27 MED ORDER — SAXENDA 18 MG/3ML ~~LOC~~ SOPN: 3.0000 mg | PEN_INJECTOR | Freq: Every day | SUBCUTANEOUS | 0 refills | Status: DC

## 2021-02-27 MED ORDER — BD PEN NEEDLE NANO 2ND GEN 32G X 4 MM MISC
0 refills | Status: DC
Start: 2021-02-27 — End: 2022-01-29

## 2021-02-27 NOTE — Telephone Encounter (Signed)
Dr.Beasley 

## 2021-02-27 NOTE — Telephone Encounter (Signed)
Pt last seen by Dr. Beasley.  

## 2021-02-28 NOTE — Progress Notes (Signed)
TeleHealth Visit:  Due to the COVID-19 pandemic, this visit was completed with telemedicine (audio/video) technology to reduce patient and provider exposure as well as to preserve personal protective equipment.   Passion has verbally consented to this TeleHealth visit. The patient is located at home, the provider is located at the [facility] office. The participants in this visit include the listed provider and patient. The visit was conducted today via MyChart video.   Chief Complaint: OBESITY Rebekah [name] is here to discuss her progress with her obesity treatment plan along with follow-up of her obesity related diagnoses. Rebekah [name] is on the Category 2 Plan and states she is following her eating plan approximately (unknown) % of the time. Rebekah [name] states she was doing a lot of yard work.   Today's visit was #: 33 Starting weight: 198 lbs Starting date: 07/21/2018  Interim History: Rebekah [name]'s weight at home suggests she has lost another 2 lbs since our last visit. She changed to video visit due to having diarrhea all night. She is doing well with her weight loss efforts otherwise, and she is getting ready to retire at the end of the month. She is planning to be very active outdoors this Summer. She is ready to restart her Saxenda.  Subjective:   1. Pre-diabetes Rebekah [name]'s highest fasting glucose reading recently has been 117. She is getting ready to restart Saxenda which should also help control her glucose levels.  2. Vitamin D deficiency Rebekah [name] is on Vit D, and her last level is not yet at goal. She has no signs of over-replacement.  3. Essential hypertension Rebekah [name] is working on diet and exercise, and she requests a refill of hydrochlorothiazide. She is due to have labs.  4. Other hyperlipidemia Rebekah [name] is on Crestor, and she is working on diet. She is due for labs.  Assessment/Plan:   1. Pre-diabetes [name] will continue to work on weight loss, exercise, and decreasing simple  carbohydrates to help decrease the risk of diabetes. Labs will be checked, and we will refill Lancets #100 and test strips #100 with no refills  - glucose blood test strip; Use OneTouch Verio test strips as instructed to check blood sugar daily.  Dispense: 100 each; Refill: 0 - OneTouch Delica Lancets 47Q MISC; Use one lancet as instructed to check blood sugar once daily.  Dispense: 100 each; Refill: 0 - Hemoglobin A1c - Insulin, random  2. Vitamin D deficiency Low Vitamin D level contributes to fatigue and are associated with obesity, breast, and colon cancer. We will check labs, and we will refill prescription Vitamin D for 1 month. [name] will follow-up for routine testing of Vitamin D, at least 2-3 times per year to avoid over-replacement.  - ergocalciferol (VITAMIN D2) 1.25 MG (50000 UT) capsule; Take 1 capsule (50,000 Units total) by mouth once a week.  Dispense: 4 capsule; Refill: 0 - VITAMIN D 25 Hydroxy (Vit-D Deficiency, Fractures)  3. Essential hypertension Rebekah [name] is working on healthy weight loss and exercise to improve blood pressure control. We will watch for signs of hypotension as she continues her lifestyle modifications. We will check labs, and will refill hydrochlorothiazide for 1 month.  - hydrochlorothiazide (MICROZIDE) 12.5 MG capsule; TAKE 1 CAPSULE BY MOUTH EVERY DAY  Dispense: 30 capsule; Refill: 0 - Comprehensive metabolic panel  4. Other hyperlipidemia Cardiovascular risk and specific lipid/LDL goals reviewed. We discussed several lifestyle modifications today. We will check labs. Rebekah [name] will continue Crestor, and will continue to work on diet, exercise and  weight loss efforts. Orders and follow up as documented in patient record.   - Lipid Panel With LDL/HDL Ratio  5. Obesity with current BMI 32.10 Rebekah [name] is currently in the action stage of change. As such, her goal is to continue with weight loss efforts. She has agreed to the Category 2 Plan.   We discussed  various medication options to help Rebekah [name] with her weight loss efforts and we both agreed to restart Saxenda 3.0 mg and pen needles #100 with no refills. (She is to start back at 0.6 mg for 2 weeks before increasing her dose).  - Liraglutide -Weight Management (SAXENDA) 18 MG/3ML SOPN; Inject 3 mg into the skin daily.  Dispense: 15 mL; Refill: 0 - Insulin Pen Needle (BD PEN NEEDLE NANO 2ND GEN) 32G X 4 MM MISC; Use pen's with Saxenda daily  Dispense: 100 each; Refill: 0  Exercise goals: As is.  Behavioral modification strategies: increasing water intake.  Rebekah [name] has agreed to follow-up with our clinic in 2 weeks. She was informed of the importance of frequent follow-up visits to maximize her success with intensive lifestyle modifications for her multiple health conditions.  Objective:   VITALS: Per patient if applicable, see vitals. GENERAL: Alert and in no acute distress. CARDIOPULMONARY: No increased WOB. Speaking in clear sentences.  PSYCH: Pleasant and cooperative. Speech normal rate and rhythm. Affect is appropriate. Insight and judgement are appropriate. Attention is focused, linear, and appropriate.  NEURO: Oriented as arrived to appointment on time with no prompting.   Lab Results  Component Value Date   CREATININE 0.85 09/29/2020   BUN 13 09/29/2020   NA 138 09/29/2020   K 4.7 09/29/2020   CL 102 09/29/2020   CO2 23 09/29/2020   Lab Results  Component Value Date   ALT 25 09/29/2020   AST 22 09/29/2020   ALKPHOS 83 09/29/2020   BILITOT <0.2 09/29/2020   Lab Results  Component Value Date   HGBA1C 5.9 (H) 09/29/2020   HGBA1C 5.8 (H) 03/16/2020   HGBA1C 6.0 (H) 11/09/2019   HGBA1C 5.9 (H) 05/04/2019   HGBA1C 5.8 (H) 10/30/2018   Lab Results  Component Value Date   INSULIN 34.2 (H) 09/29/2020   INSULIN 24.6 03/16/2020   INSULIN 21.2 05/04/2019   INSULIN 25.1 (H) 10/30/2018   INSULIN 38.3 (H) 07/21/2018   Lab Results  Component Value Date   TSH 3.320 11/09/2019    Lab Results  Component Value Date   CHOL 128 03/16/2020   HDL 54 03/16/2020   LDLCALC 58 03/16/2020   TRIG 79 03/16/2020   CHOLHDL 2 07/10/2018   Lab Results  Component Value Date   WBC 8.0 02/23/2020   HGB 12.4 02/23/2020   HCT 38.2 02/23/2020   MCV 86 02/23/2020   PLT 310 02/23/2020   Lab Results  Component Value Date   IRON 86 01/07/2015   FERRITIN 19.1 01/07/2015    Attestation Statements:   Reviewed by clinician on day of visit: allergies, medications, problem list, medical history, surgical history, family history, social history, and previous encounter notes.   I, Trixie Dredge, am acting as transcriptionist for Dennard Nip, MD.  I have reviewed the above documentation for accuracy and completeness, and I agree with the above. - Dennard Nip, MD

## 2021-03-09 LAB — COMPREHENSIVE METABOLIC PANEL
ALT: 18 IU/L (ref 0–32)
AST: 19 IU/L (ref 0–40)
Albumin/Globulin Ratio: 1.5 (ref 1.2–2.2)
Albumin: 4.3 g/dL (ref 3.8–4.9)
Alkaline Phosphatase: 82 IU/L (ref 44–121)
BUN/Creatinine Ratio: 15 (ref 9–23)
BUN: 14 mg/dL (ref 6–24)
Bilirubin Total: <0.2 mg/dL (ref 0.0–1.2)
CO2: 24 mmol/L (ref 20–29)
Calcium: 9.5 mg/dL (ref 8.7–10.2)
Chloride: 103 mmol/L (ref 96–106)
Creatinine, Ser: 0.91 mg/dL (ref 0.57–1.00)
Globulin, Total: 2.9 g/dL (ref 1.5–4.5)
Glucose: 91 mg/dL (ref 65–99)
Potassium: 4.6 mmol/L (ref 3.5–5.2)
Sodium: 143 mmol/L (ref 134–144)
Total Protein: 7.2 g/dL (ref 6.0–8.5)
eGFR: 73 mL/min/{1.73_m2} (ref >59)

## 2021-03-09 LAB — LIPID PANEL WITH LDL/HDL RATIO
Cholesterol, Total: 129 mg/dL (ref 100–199)
HDL: 47 mg/dL (ref >39)
LDL Chol Calc (NIH): 66 mg/dL (ref 0–99)
LDL/HDL Ratio: 1.4 ratio (ref 0.0–3.2)
Triglycerides: 83 mg/dL (ref 0–149)
VLDL Cholesterol Cal: 16 mg/dL (ref 5–40)

## 2021-03-09 LAB — VITAMIN D 25 HYDROXY (VIT D DEFICIENCY, FRACTURES): Vit D, 25-Hydroxy: 61.8 ng/mL (ref 30.0–100.0)

## 2021-03-09 LAB — INSULIN, RANDOM: INSULIN: 29.9 u[IU]/mL — ABNORMAL HIGH (ref 2.6–24.9)

## 2021-03-21 ENCOUNTER — Encounter (INDEPENDENT_AMBULATORY_CARE_PROVIDER_SITE_OTHER): Payer: Self-pay | Admitting: Family Medicine

## 2021-03-21 ENCOUNTER — Other Ambulatory Visit: Payer: Self-pay

## 2021-03-21 ENCOUNTER — Ambulatory Visit (INDEPENDENT_AMBULATORY_CARE_PROVIDER_SITE_OTHER): Payer: No Typology Code available for payment source | Admitting: Family Medicine

## 2021-03-21 VITALS — BP 133/80 | HR 92 | Temp 98.5°F | Ht 64.0 in | Wt 185.0 lb

## 2021-03-21 DIAGNOSIS — E669 Obesity, unspecified: Secondary | ICD-10-CM | POA: Diagnosis not present

## 2021-03-21 DIAGNOSIS — Z6833 Body mass index (BMI) 33.0-33.9, adult: Secondary | ICD-10-CM | POA: Diagnosis not present

## 2021-03-21 DIAGNOSIS — E119 Type 2 diabetes mellitus without complications: Secondary | ICD-10-CM | POA: Diagnosis not present

## 2021-03-21 DIAGNOSIS — E66811 Obesity, class 1: Secondary | ICD-10-CM

## 2021-03-22 NOTE — Progress Notes (Signed)
Chief Complaint:   OBESITY Rebekah [name] is here to discuss her progress with her obesity treatment plan along with follow-up of her obesity related diagnoses. Rebekah [name] is on the Category 2 Plan and states she is following her eating plan approximately 25% of the time. Rebekah [name] states she is doing  Gardening for 1.5-2 hours 3 times per week.   Today's visit was #: 73 Starting weight: 198 lbs Starting date: 07/21/2018 Today's weight: 185 lbs Today's date: 03/21/2021 Total lbs lost to date: 13 Total lbs lost since last in-office visit: 2  Interim History: Rebekah [name] continues to do well with weight loss even while on vacation. She has her Kirke Shaggy now and plans to start it tomorrow. She is now retired and is planning on concentrating on her health more. She has 5 gardens. She is working this Summer and will continue to eat a lot of vegetables.  Subjective:   1. Type 2 diabetes mellitus without complication, without long-term current use of insulin ([place]) Rebekah [name] is on metformin and she is getting ready to start Saxenda. She is working on diet and weight loss.  Assessment/Plan:   1. Type 2 diabetes mellitus without complication, without long-term current use of insulin ([place]) Rebekah [name] will continue with diet and exercise, and we will recheck labs in 2 months. Good blood sugar control is important to decrease the likelihood of diabetic complications such as nephropathy, neuropathy, limb loss, blindness, coronary artery disease, and death. Intensive lifestyle modification including diet, exercise and weight loss are the first line of treatment for diabetes.   2. Obesity with current BMI 31.8 Rebekah [name] is currently in the action stage of change. As such, her goal is to continue with weight loss efforts. She has agreed to the Category 2 Plan.   Rebekah [name] is to start [name] tomorrow and will continue with diet and weight loss.  Exercise goals: As is.  Behavioral modification strategies: increasing lean protein intake  and ways to avoid boredom eating.  Rebekah [name] has agreed to follow-up with our clinic in 4 weeks. She was informed of the importance of frequent follow-up visits to maximize her success with intensive lifestyle modifications for her multiple health conditions.   Objective:   Blood pressure 133/80, pulse 92, temperature 98.5 F (36.9 C), height 5\' 4"  (1.626 m), weight 185 lb (83.9 kg), SpO2 96 %. Body mass index is 31.76 kg/m.  General: Cooperative, alert, well developed, in no acute distress. HEENT: Conjunctivae and lids unremarkable. Cardiovascular: Regular rhythm.  Lungs: Normal work of breathing. Neurologic: No focal deficits.   Lab Results  Component Value Date   CREATININE 0.91 03/08/2021   BUN 14 03/08/2021   NA 143 03/08/2021   K 4.6 03/08/2021   CL 103 03/08/2021   CO2 24 03/08/2021   Lab Results  Component Value Date   ALT 18 03/08/2021   AST 19 03/08/2021   ALKPHOS 82 03/08/2021   BILITOT <0.2 03/08/2021   Lab Results  Component Value Date   HGBA1C 5.9 (H) 09/29/2020   HGBA1C 5.8 (H) 03/16/2020   HGBA1C 6.0 (H) 11/09/2019   HGBA1C 5.9 (H) 05/04/2019   HGBA1C 5.8 (H) 10/30/2018   Lab Results  Component Value Date   INSULIN 29.9 (H) 03/08/2021   INSULIN 34.2 (H) 09/29/2020   INSULIN 24.6 03/16/2020   INSULIN 21.2 05/04/2019   INSULIN 25.1 (H) 10/30/2018   Lab Results  Component Value Date   TSH 3.320 11/09/2019   Lab Results  Component Value Date   CHOL  129 03/08/2021   HDL 47 03/08/2021   LDLCALC 66 03/08/2021   TRIG 83 03/08/2021   CHOLHDL 2 07/10/2018   Lab Results  Component Value Date   WBC 8.0 02/23/2020   HGB 12.4 02/23/2020   HCT 38.2 02/23/2020   MCV 86 02/23/2020   PLT 310 02/23/2020   Lab Results  Component Value Date   IRON 86 01/07/2015   FERRITIN 19.1 01/07/2015   Attestation Statements:   Reviewed by clinician on day of visit: allergies, medications, problem list, medical history, surgical history, family history, social  history, and previous encounter notes.  Time spent on visit including pre-visit chart review and post-visit care and charting was 22 minutes.    I, Trixie Dredge, am acting as transcriptionist for Dennard Nip, MD.  I have reviewed the above documentation for accuracy and completeness, and I agree with the above. -  Dennard Nip, MD

## 2021-03-27 ENCOUNTER — Ambulatory Visit (INDEPENDENT_AMBULATORY_CARE_PROVIDER_SITE_OTHER): Payer: Self-pay | Admitting: Family Medicine

## 2021-04-18 ENCOUNTER — Ambulatory Visit (INDEPENDENT_AMBULATORY_CARE_PROVIDER_SITE_OTHER): Payer: Self-pay | Admitting: Family Medicine

## 2021-04-18 ENCOUNTER — Encounter (INDEPENDENT_AMBULATORY_CARE_PROVIDER_SITE_OTHER): Payer: Self-pay | Admitting: Family Medicine

## 2021-04-18 ENCOUNTER — Encounter (INDEPENDENT_AMBULATORY_CARE_PROVIDER_SITE_OTHER): Payer: Self-pay

## 2021-04-18 NOTE — Telephone Encounter (Signed)
Dr.Beasley 

## 2021-04-23 ENCOUNTER — Other Ambulatory Visit (INDEPENDENT_AMBULATORY_CARE_PROVIDER_SITE_OTHER): Payer: Self-pay | Admitting: Family Medicine

## 2021-04-23 DIAGNOSIS — E559 Vitamin D deficiency, unspecified: Secondary | ICD-10-CM

## 2021-04-25 MED ORDER — ERGOCALCIFEROL 1.25 MG (50000 UT) PO CAPS: 50000.0000 [IU] | ORAL_CAPSULE | ORAL | 0 refills | Status: DC

## 2021-04-25 NOTE — Telephone Encounter (Signed)
Vit D last filled 02/27/21 #4, Last OV 5/31, was supposed to be seen in 4 weeks, Next appt 05/11/21  Would you like to refill?

## 2021-05-11 ENCOUNTER — Ambulatory Visit (INDEPENDENT_AMBULATORY_CARE_PROVIDER_SITE_OTHER): Payer: 59 | Admitting: Family Medicine

## 2021-05-11 ENCOUNTER — Other Ambulatory Visit: Payer: Self-pay

## 2021-05-11 ENCOUNTER — Encounter (INDEPENDENT_AMBULATORY_CARE_PROVIDER_SITE_OTHER): Payer: Self-pay | Admitting: Family Medicine

## 2021-05-11 VITALS — BP 123/78 | HR 88 | Temp 98.5°F | Ht 64.0 in | Wt 184.0 lb

## 2021-05-11 DIAGNOSIS — Z9189 Other specified personal risk factors, not elsewhere classified: Secondary | ICD-10-CM | POA: Diagnosis not present

## 2021-05-11 DIAGNOSIS — R7303 Prediabetes: Secondary | ICD-10-CM | POA: Diagnosis not present

## 2021-05-11 DIAGNOSIS — E669 Obesity, unspecified: Secondary | ICD-10-CM

## 2021-05-11 DIAGNOSIS — E559 Vitamin D deficiency, unspecified: Secondary | ICD-10-CM

## 2021-05-11 DIAGNOSIS — I10 Essential (primary) hypertension: Secondary | ICD-10-CM

## 2021-05-11 DIAGNOSIS — Z6833 Body mass index (BMI) 33.0-33.9, adult: Secondary | ICD-10-CM

## 2021-05-11 MED ORDER — ERGOCALCIFEROL 1.25 MG (50000 UT) PO CAPS: 50000.0000 [IU] | ORAL_CAPSULE | ORAL | 0 refills | Status: DC

## 2021-05-11 MED ORDER — HYDROCHLOROTHIAZIDE 12.5 MG PO CAPS: ORAL_CAPSULE | ORAL | 0 refills | Status: DC

## 2021-05-11 MED ORDER — METFORMIN HCL 500 MG PO TABS: 500.0000 mg | ORAL_TABLET | Freq: Every day | ORAL | 0 refills | Status: DC

## 2021-05-18 NOTE — Progress Notes (Signed)
Chief Complaint:   OBESITY Rebekah [name] is here to discuss her progress with her obesity treatment plan along with follow-up of her obesity related diagnoses. Caretha is on the Category 2 Plan and states she is following her eating plan approximately 25% of the time. Sidra states she is active while gardening for 3 hours 6 times per week.   Today's visit was #: 69 Starting weight: 198 lbs Starting date: 07/21/2018 Today's weight: 184 lbs Today's date: 05/11/2021 Total lbs lost to date: 14 Total lbs lost since last in-office visit: 1  Interim History: Rebekah [name] has been doing well with weight loss. She was sick recently and has had decreased sense of taste or smell. She was negative for COVID 3 times. She is getting back to her eating plan and weight loss.  Subjective:   1. Pre-diabetes Rebekah [name] is stable on metformin, and she denies nausea or vomiting.  2. Essential hypertension Rebekah [name]'s blood pressure is stable on hydrochlorothiazide. She denies chest pain or lightheadedness.  3. Vitamin D deficiency Rebekah [name] is stable on Vit D, but her level is not yet at goal.  4. At risk for diabetes mellitus Rebekah [name] is at higher than average risk for developing diabetes due to obesity.   Assessment/Plan:   1. Pre-diabetes Rebekah [name] will continue to work on weight loss, exercise, and decreasing simple carbohydrates to help decrease the risk of diabetes. We will refill metformin for 90 days with no refills.  - metFORMIN (GLUCOPHAGE) 500 MG tablet; Take 1 tablet (500 mg total) by mouth daily with breakfast.  Dispense: 90 tablet; Refill: 0  2. Essential hypertension Janeen will continue working on healthy weight loss and exercise to improve blood pressure control. We will refill hydrochlorothiazide for 90 days with no refills, and will watch for signs of hypotension as she continues her lifestyle modifications.  - hydrochlorothiazide (MICROZIDE) 12.5 MG capsule; TAKE 1 CAPSULE BY MOUTH EVERY DAY  Dispense: 90  capsule; Refill: 0  3. Vitamin D deficiency Low Vitamin D level contributes to fatigue and are associated with obesity, breast, and colon cancer. We will refill prescription Vitamin D 50,000 IU every week for 90 days with no refills. Melvena will follow-up for routine testing of Vitamin D, at least 2-3 times per year to avoid over-replacement.  - ergocalciferol (VITAMIN D2) 1.25 MG (50000 UT) capsule; Take 1 capsule (50,000 Units total) by mouth once a week.  Dispense: 12 capsule; Refill: 0  4. At risk for diabetes mellitus Rebekah [name] was given approximately 15 minutes of diabetes education and counseling today. We discussed intensive lifestyle modifications today with an emphasis on weight loss as well as increasing exercise and decreasing simple carbohydrates in her diet. We also reviewed medication options with an emphasis on risk versus benefit of those discussed.   Repetitive spaced learning was employed today to elicit superior memory formation and behavioral change.   5. Obesity with current BMI 31.6 Rebekah [name] is currently in the action stage of change. As such, her goal is to continue with weight loss efforts. She has agreed to the Category 2 Plan.   Exercise goals: As is.  Behavioral modification strategies: increasing lean protein intake.  Rebekah [name] has agreed to follow-up with our clinic in 4 weeks. She was informed of the importance of frequent follow-up visits to maximize her success with intensive lifestyle modifications for her multiple health conditions.   Objective:   Blood pressure 123/78, pulse 88, temperature 98.5 F (36.9 C), height '5\' 4"'$  (1.626 m), weight 184 lb (  83.5 kg), SpO2 97 %. Body mass index is 31.58 kg/m.  General: Cooperative, alert, well developed, in no acute distress. HEENT: Conjunctivae and lids unremarkable. Cardiovascular: Regular rhythm.  Lungs: Normal work of breathing. Neurologic: No focal deficits.   Lab Results  Component Value Date   CREATININE 0.91  03/08/2021   BUN 14 03/08/2021   NA 143 03/08/2021   K 4.6 03/08/2021   CL 103 03/08/2021   CO2 24 03/08/2021   Lab Results  Component Value Date   ALT 18 03/08/2021   AST 19 03/08/2021   ALKPHOS 82 03/08/2021   BILITOT <0.2 03/08/2021   Lab Results  Component Value Date   HGBA1C 5.9 (H) 09/29/2020   HGBA1C 5.8 (H) 03/16/2020   HGBA1C 6.0 (H) 11/09/2019   HGBA1C 5.9 (H) 05/04/2019   HGBA1C 5.8 (H) 10/30/2018   Lab Results  Component Value Date   INSULIN 29.9 (H) 03/08/2021   INSULIN 34.2 (H) 09/29/2020   INSULIN 24.6 03/16/2020   INSULIN 21.2 05/04/2019   INSULIN 25.1 (H) 10/30/2018   Lab Results  Component Value Date   TSH 3.320 11/09/2019   Lab Results  Component Value Date   CHOL 129 03/08/2021   HDL 47 03/08/2021   LDLCALC 66 03/08/2021   TRIG 83 03/08/2021   CHOLHDL 2 07/10/2018   Lab Results  Component Value Date   VD25OH 61.8 03/08/2021   VD25OH 44.0 09/29/2020   VD25OH 62.4 03/16/2020   Lab Results  Component Value Date   WBC 8.0 02/23/2020   HGB 12.4 02/23/2020   HCT 38.2 02/23/2020   MCV 86 02/23/2020   PLT 310 02/23/2020   Lab Results  Component Value Date   IRON 86 01/07/2015   FERRITIN 19.1 01/07/2015   Attestation Statements:   Reviewed by clinician on day of visit: allergies, medications, problem list, medical history, surgical history, family history, social history, and previous encounter notes.   I, Trixie Dredge, am acting as transcriptionist for Dennard Nip, MD.  I have reviewed the above documentation for accuracy and completeness, and I agree with the above. -  Dennard Nip, MD

## 2021-06-12 ENCOUNTER — Other Ambulatory Visit: Payer: Self-pay | Admitting: Internal Medicine

## 2021-06-12 ENCOUNTER — Encounter (INDEPENDENT_AMBULATORY_CARE_PROVIDER_SITE_OTHER): Payer: Self-pay | Admitting: Family Medicine

## 2021-06-12 ENCOUNTER — Ambulatory Visit (INDEPENDENT_AMBULATORY_CARE_PROVIDER_SITE_OTHER): Payer: 59 | Admitting: Family Medicine

## 2021-06-12 ENCOUNTER — Other Ambulatory Visit: Payer: Self-pay

## 2021-06-12 VITALS — BP 140/73 | HR 82 | Temp 98.1°F | Ht 64.0 in | Wt 181.0 lb

## 2021-06-12 DIAGNOSIS — Z6833 Body mass index (BMI) 33.0-33.9, adult: Secondary | ICD-10-CM | POA: Diagnosis not present

## 2021-06-12 DIAGNOSIS — I10 Essential (primary) hypertension: Secondary | ICD-10-CM

## 2021-06-12 DIAGNOSIS — E669 Obesity, unspecified: Secondary | ICD-10-CM

## 2021-06-13 ENCOUNTER — Encounter: Payer: Self-pay | Admitting: Internal Medicine

## 2021-06-13 NOTE — Progress Notes (Signed)
Chief Complaint:   OBESITY Rebekah [name] is here to discuss her progress with her obesity treatment plan along with follow-up of her obesity related diagnoses. Rebekah [name] is on the Category 2 Plan and states she is following her eating plan approximately 50% of the time. Rebekah [name] states she is doing yard work for 2-3 hour 4-5 times per week.   Today's visit was #: 35 Starting weight: 198 lbs Starting date: 07/21/2018 Today's weight: 181 lbs Today's date: 06/12/2021 Total lbs lost to date: 17 Total lbs lost since last in-office visit: 3  Interim History: Rebekah [name] continues to do very well with weight loss despite extra challenges with her kitchen renovations and a broken air conditioner. She is pleasantly surprised that she lost weight, but she has been mindful of her food choices and she has been very active.  Subjective:   1. Essential hypertension Rebekah [name]'s blood pressure is borderline elevated today, but this is not typical. She has had increased stress which may contribute.  Assessment/Plan:   1. Essential hypertension Rebekah [name] will continue with diet, exercise, and her medications to improve blood pressure control. She will watch for signs of hypotension as she continues her lifestyle modifications.  2. Obesity with current BMI 31.2 Rebekah [name] is currently in the action stage of change. As such, her goal is to continue with weight loss efforts. She has agreed to the Category 2 Plan.   Exercise goals: As is.  Behavioral modification strategies: no skipping meals.  Rebekah [name] has agreed to follow-up with our clinic in 8 weeks. She was informed of the importance of frequent follow-up visits to maximize her success with intensive lifestyle modifications for her multiple health conditions.   Objective:   Blood pressure 140/73, pulse 82, temperature 98.1 F (36.7 C), height '5\' 4"'$  (1.626 m), weight 181 lb (82.1 kg), SpO2 97 %. Body mass index is 31.07 kg/m.  General: Cooperative, alert, well developed, in  no acute distress. HEENT: Conjunctivae and lids unremarkable. Cardiovascular: Regular rhythm.  Lungs: Normal work of breathing. Neurologic: No focal deficits.   Lab Results  Component Value Date   CREATININE 0.91 03/08/2021   BUN 14 03/08/2021   NA 143 03/08/2021   K 4.6 03/08/2021   CL 103 03/08/2021   CO2 24 03/08/2021   Lab Results  Component Value Date   ALT 18 03/08/2021   AST 19 03/08/2021   ALKPHOS 82 03/08/2021   BILITOT <0.2 03/08/2021   Lab Results  Component Value Date   HGBA1C 5.9 (H) 09/29/2020   HGBA1C 5.8 (H) 03/16/2020   HGBA1C 6.0 (H) 11/09/2019   HGBA1C 5.9 (H) 05/04/2019   HGBA1C 5.8 (H) 10/30/2018   Lab Results  Component Value Date   INSULIN 29.9 (H) 03/08/2021   INSULIN 34.2 (H) 09/29/2020   INSULIN 24.6 03/16/2020   INSULIN 21.2 05/04/2019   INSULIN 25.1 (H) 10/30/2018   Lab Results  Component Value Date   TSH 3.320 11/09/2019   Lab Results  Component Value Date   CHOL 129 03/08/2021   HDL 47 03/08/2021   LDLCALC 66 03/08/2021   TRIG 83 03/08/2021   CHOLHDL 2 07/10/2018   Lab Results  Component Value Date   VD25OH 61.8 03/08/2021   VD25OH 44.0 09/29/2020   VD25OH 62.4 03/16/2020   Lab Results  Component Value Date   WBC 8.0 02/23/2020   HGB 12.4 02/23/2020   HCT 38.2 02/23/2020   MCV 86 02/23/2020   PLT 310 02/23/2020   Lab Results  Component Value  Date   IRON 86 01/07/2015   FERRITIN 19.1 01/07/2015   Attestation Statements:   Reviewed by clinician on day of visit: allergies, medications, problem list, medical history, surgical history, family history, social history, and previous encounter notes.  Time spent on visit including pre-visit chart review and post-visit care and charting was 30 minutes.    I, Trixie Dredge, am acting as transcriptionist for Dennard Nip, MD.  I have reviewed the above documentation for accuracy and completeness, and I agree with the above. -  Dennard Nip, MD

## 2021-06-14 MED ORDER — NORTRIPTYLINE HCL 75 MG PO CAPS: 75.0000 mg | ORAL_CAPSULE | Freq: Every day | ORAL | 3 refills | Status: DC

## 2021-06-14 MED ORDER — DEPLIN 7.5 7.5-90.314 MG PO CAPS: 1.0000 | ORAL_CAPSULE | Freq: Every day | ORAL | 3 refills | Status: DC

## 2021-06-14 MED ORDER — ROSUVASTATIN CALCIUM 10 MG PO TABS: 10.0000 mg | ORAL_TABLET | Freq: Every day | ORAL | 3 refills | Status: DC

## 2021-06-14 MED ORDER — ALENDRONATE SODIUM 70 MG PO TABS: 70.0000 mg | ORAL_TABLET | ORAL | 3 refills | Status: DC

## 2021-07-12 DIAGNOSIS — R87613 High grade squamous intraepithelial lesion on cytologic smear of cervix (HGSIL): Secondary | ICD-10-CM | POA: Insufficient documentation

## 2021-07-13 LAB — HM MAMMOGRAPHY

## 2021-07-17 ENCOUNTER — Other Ambulatory Visit (INDEPENDENT_AMBULATORY_CARE_PROVIDER_SITE_OTHER): Payer: Self-pay | Admitting: Family Medicine

## 2021-07-17 DIAGNOSIS — R7303 Prediabetes: Secondary | ICD-10-CM

## 2021-07-18 NOTE — Telephone Encounter (Signed)
LAST APPOINTMENT DATE: 06/12/21 NEXT APPOINTMENT DATE: 08/07/21   [place], [place] - [address] [address] [facility] [address] [place] [address] Phone: [phone] Fax: [phone]  [facility]  ([place], [facility] [address] [place] [address] [place] [address] Phone: [phone] Fax: [phone]  [place], [place] [address] [place] [address] Phone: [phone] Fax: [phone]  [facility] ([facility]) - [place], [address] [place] [address] Phone: [phone] Fax: [phone]  Patient is requesting a refill of the following medications: Requested Prescriptions   Pending Prescriptions Disp Refills   metFORMIN (GLUCOPHAGE) 500 MG tablet [Pharmacy Med Name: metFORMIN HCl 500 MG Oral Tablet] 90 tablet 3    Sig: TAKE 1 TABLET BY MOUTH  DAILY WITH BREAKFAST    Date last filled: 05/11/21 Previously prescribed by [facility]  Lab Results  Component Value Date   HGBA1C 5.9 (H) 09/29/2020   HGBA1C 5.8 (H) 03/16/2020   HGBA1C 6.0 (H) 11/09/2019   Lab Results  Component Value Date   LDLCALC 66 03/08/2021   CREATININE 0.91 03/08/2021   Lab Results  Component Value Date   VD25OH 61.8 03/08/2021   VD25OH 44.0 09/29/2020   VD25OH 62.4 03/16/2020    BP Readings from Last 3 Encounters:  06/12/21 140/73  05/11/21 123/78  03/21/21 133/80

## 2021-07-25 ENCOUNTER — Other Ambulatory Visit: Payer: Self-pay

## 2021-07-25 ENCOUNTER — Ambulatory Visit (INDEPENDENT_AMBULATORY_CARE_PROVIDER_SITE_OTHER): Payer: 59 | Admitting: Internal Medicine

## 2021-07-25 ENCOUNTER — Encounter: Payer: Self-pay | Admitting: Internal Medicine

## 2021-07-25 VITALS — BP 132/82 | HR 82 | Temp 98.1°F | Resp 16 | Ht 64.0 in | Wt 185.5 lb

## 2021-07-25 DIAGNOSIS — E782 Mixed hyperlipidemia: Secondary | ICD-10-CM | POA: Diagnosis not present

## 2021-07-25 DIAGNOSIS — I10 Essential (primary) hypertension: Secondary | ICD-10-CM

## 2021-07-25 DIAGNOSIS — Z0000 Encounter for general adult medical examination without abnormal findings: Secondary | ICD-10-CM | POA: Diagnosis not present

## 2021-07-25 DIAGNOSIS — Z23 Encounter for immunization: Secondary | ICD-10-CM | POA: Diagnosis not present

## 2021-07-25 NOTE — Patient Instructions (Signed)
   GO TO THE LAB : Get the blood work     GO TO THE FRONT DESK, PLEASE SCHEDULE YOUR APPOINTMENTS Come back for a physical exam in 1 year 

## 2021-07-25 NOTE — Progress Notes (Signed)
Subjective:    Patient ID: Rebekah [name], female    DOB: Oct 02, 1961, 60 y.o.   MRN: [id number]  DOS:  07/25/2021 Type of visit - description: CPX  Since the last office visit is doing well and has no major concerns. Does have occasional stomach pains, this is not new issue.  Symptoms at baseline.  Review of Systems  Other than above, a 14 point review of systems is negative       Past Medical History:  Diagnosis Date   Allergy    Back pain    Bowel obstruction ([place]) 2019   Depression    Gall bladder stones    GERD (gastroesophageal reflux disease)    Hyperlipidemia    Hypertension    IBS (irritable bowel syndrome)    IBS (irritable bowel syndrome)    Joint pain    Obesity    Lap band surgery 12-08 Dr Hassell Done   OSA (obstructive sleep apnea)    using a CPAP     Pre-diabetes    REDUCTION MAMMOPLASTY, HX OF 09/22/2007   Qualifier: Diagnosis of  By: Larose Kells MD, [place]     Past Surgical History:  Procedure Laterality Date   APPENDECTOMY     BREAST REDUCTION SURGERY     CHOLECYSTECTOMY     CLOSED REDUCTION FINGER WITH PERCUTANEOUS PINNING Left 09/14/2014   Procedure: CLOSED REDUCTION PERCUTANEOUS PINNING ;  Surgeon: Leanora Cover, MD;  Location: [facility];  Service: Orthopedics;  Laterality: Left;   ESOPHAGEAL MANOMETRY N/A 09/23/2017   Procedure: ESOPHAGEAL MANOMETRY (EM);  Surgeon: Mauri Pole, MD;  Location: [facility];  Service: Endoscopy;  Laterality: N/A;   Lap Band Adjustment  09/01/2015   Dr. Redmond Pulling   LAPAROSCOPIC GASTRIC BANDING  09/2007   Lap band surgery 12-08 Dr Hassell Done   NASAL SINUS SURGERY     TONSILLECTOMY     x2   Social History   Socioeconomic History   Marital status: Married    Spouse name: daniel   Number of children: 0   Years of education: Not on file   Highest education level: Not on file  Occupational History   Occupation: [employer]- works from home    Comment: retired 2022  Tobacco Use   Smoking status: Never    Smokeless tobacco: Never  Vaping Use   Vaping Use: Never used  Substance and Sexual Activity   Alcohol use: Yes    Comment: socially    Drug use: No   Sexual activity: Yes    Partners: Male    Birth control/protection: None  Other Topics Concern   Not on file  Social History Narrative   Lives w/ husband   Social Determinants of Health   Financial Resource Strain: Not on file  Food Insecurity: Not on file  Transportation Needs: Not on file  Physical Activity: Not on file  Stress: Not on file  Social Connections: Not on file  Intimate Partner Violence: Not on file    Allergies as of 07/25/2021       Reactions   Penicillins Hives, Rash   Has patient had a PCN reaction causing immediate rash, facial/tongue/throat swelling, SOB or lightheadedness with hypotension: Yes Has patient had a PCN reaction causing severe rash involving mucus membranes or skin necrosis: Yes Has patient had a PCN reaction that required hospitalization: No Has patient had a PCN reaction occurring within the last 10 years: No If all of the above answers are "NO", then may  proceed with Cephalosporin use.   Erythromycin Nausea Only   Gastrointestinal issues.        Medication List        Accurate as of July 25, 2021 11:59 PM. If you have any questions, ask your nurse or doctor.          alendronate 70 MG tablet Commonly known as: FOSAMAX Take 1 tablet (70 mg total) by mouth every 7 (seven) days. Take with a full glass of water on an empty stomach.   aspirin EC 81 MG tablet Take 81 mg by mouth daily. Swallow whole. What changed: Another medication with the same name was removed. Continue taking this medication, and follow the directions you see here. Changed by: Kathlene November, MD   BD Pen Needle Nano 2nd Gen 32G X 4 MM Misc Generic drug: Insulin Pen Needle Use pen's with Saxenda daily   Deplin 7.5 7.5-90.314 MG Caps Take 1 tablet by mouth daily.   Divigel 1 MG/GM Gel Generic drug:  Estradiol Place 1 mg onto the skin daily.   ELDERBERRY PO   ergocalciferol 1.25 MG (50000 UT) capsule Commonly known as: VITAMIN D2 Take 1 capsule (50,000 Units total) by mouth once a week.   glucose blood test strip Use OneTouch Verio test strips as instructed to check blood sugar daily.   hydrochlorothiazide 12.5 MG capsule Commonly known as: MICROZIDE TAKE 1 CAPSULE BY MOUTH EVERY DAY   metFORMIN 500 MG tablet Commonly known as: GLUCOPHAGE TAKE 1 TABLET BY MOUTH  DAILY WITH BREAKFAST   multivitamin tablet Take 1 tablet by mouth daily.   nortriptyline 75 MG capsule Commonly known as: PAMELOR Take 1 capsule (75 mg total) by mouth daily.   omeprazole 40 MG capsule Commonly known as: PRILOSEC Take 1 capsule (40 mg total) by mouth daily.   OneTouch Delica Lancets 44W Misc Use one lancet as instructed to check blood sugar once daily.   OneTouch Verio Reflect w/Device Kit 1 each by Does not apply route daily. Use OneTouch Verio Reflect to check blood sugar once daily.   Probiotic-10 Chew Chew 1 tablet by mouth daily.   progesterone 100 MG capsule Commonly known as: PROMETRIUM Take 100 mg by mouth daily.   rosuvastatin 10 MG tablet Commonly known as: CRESTOR Take 1 tablet (10 mg total) by mouth daily.   Saxenda 18 MG/3ML Sopn Generic drug: Liraglutide -Weight Management Inject 3 mg into the skin daily.           Objective:   Physical Exam BP 132/82 (BP Location: Left Arm, Patient Position: Sitting, Cuff Size: Small)   Pulse 82   Temp 98.1 F (36.7 C) (Oral)   Resp 16   Ht '5\' 4"'  (1.626 m)   Wt 185 lb 8 oz (84.1 kg)   SpO2 98%   BMI 31.84 kg/m  General: Well developed, NAD, BMI noted Neck: No  thyromegaly  HEENT:  Normocephalic . Face symmetric, atraumatic Lungs:  CTA B Normal respiratory effort, no intercostal retractions, no accessory muscle use. Heart: RRR,  no murmur.  Abdomen:  Not distended, soft, non-tender. No rebound or rigidity.  Lap  band noted at the right side of the abdomen Lower extremities: no pretibial edema bilaterally  Skin: Exposed areas without rash. Not pale. Not jaundice Neurologic:  alert & oriented X3.  Speech normal, gait appropriate for age and unassisted Strength symmetric and appropriate for age.  Psych: Cognition and judgment appear intact.  Cooperative with normal attention span and concentration.  Behavior  appropriate. No anxious or depressed appearing.     Assessment     Assessment  Prediabetes HTN Hyperlipidemia: lipitor aches (2018) Anxiety, depression (chronic sx, occ suicidal ideas, h/o self cutting). Used to see Dr Casimiro Needle GERD Migraines  Obesity, lap band surgery 2008 OSA: not using CPAP as off 06/2020 Acne (doxy) ++FH CAD: F, M , B MI age 51, G-parents  SBO 09-2017  PLAN: Here for CPX Prediabetes: Check A1c.    HTN, seems well controlled, RF meds as needed Hyperlipidemia: Last FLP very good on Crestor Obesity: Follow-up at the wellness clinic FH CAD: On Crestor, for now continue aspirin. Osteoporosis: Based on T score of -2.3 in December 2021, she was recommended to continue with HRT, take calcium, vitamin D and start Fosamax which she is tolerating okay. Social: Retired few months ago, having more time to be active. RTC 1 year      This visit occurred during the SARS-CoV-2 public health emergency.  Safety protocols were in place, including screening questions prior to the visit, additional usage of staff PPE, and extensive cleaning of exam room while observing appropriate contact time as indicated for disinfecting solutions.

## 2021-07-26 ENCOUNTER — Encounter: Payer: Self-pay | Admitting: Internal Medicine

## 2021-07-26 LAB — CBC WITH DIFFERENTIAL/PLATELET
Basophils Absolute: 0.1 10*3/uL (ref 0.0–0.1)
Basophils Relative: 0.6 % (ref 0.0–3.0)
Eosinophils Absolute: 0.2 10*3/uL (ref 0.0–0.7)
Eosinophils Relative: 2 % (ref 0.0–5.0)
HCT: 39.5 % (ref 36.0–46.0)
Hemoglobin: 12.9 g/dL (ref 12.0–15.0)
Lymphocytes Relative: 34.8 % (ref 12.0–46.0)
Lymphs Abs: 3.6 10*3/uL (ref 0.7–4.0)
MCHC: 32.8 g/dL (ref 30.0–36.0)
MCV: 84.4 fl (ref 78.0–100.0)
Monocytes Absolute: 0.6 10*3/uL (ref 0.1–1.0)
Monocytes Relative: 5.7 % (ref 3.0–12.0)
Neutro Abs: 5.9 10*3/uL (ref 1.4–7.7)
Neutrophils Relative %: 56.9 % (ref 43.0–77.0)
Platelets: 359 10*3/uL (ref 150.0–400.0)
RBC: 4.68 Mil/uL (ref 3.87–5.11)
RDW: 16.4 % — ABNORMAL HIGH (ref 11.5–15.5)
WBC: 10.3 10*3/uL (ref 4.0–10.5)

## 2021-07-26 LAB — TSH: TSH: 3.83 u[IU]/mL (ref 0.35–5.50)

## 2021-07-26 LAB — HEMOGLOBIN A1C: Hgb A1c MFr Bld: 6.3 % (ref 4.6–6.5)

## 2021-07-26 NOTE — Assessment & Plan Note (Signed)
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

## 2021-07-26 NOTE — Assessment & Plan Note (Signed)
Here for CPX Prediabetes: Check A1c.    HTN, seems well controlled, RF meds as needed Hyperlipidemia: Last FLP very good on Crestor Obesity: Follow-up at the wellness clinic FH CAD: On Crestor, for now continue aspirin. Osteoporosis: Based on T score of -2.3 in December 2021, she was recommended to continue with HRT, take calcium, vitamin D and start Fosamax which she is tolerating okay. Social: Retired few months ago, having more time to be active. RTC 1 year

## 2021-08-04 ENCOUNTER — Other Ambulatory Visit (INDEPENDENT_AMBULATORY_CARE_PROVIDER_SITE_OTHER): Payer: Self-pay | Admitting: Family Medicine

## 2021-08-04 DIAGNOSIS — E559 Vitamin D deficiency, unspecified: Secondary | ICD-10-CM

## 2021-08-07 ENCOUNTER — Encounter (INDEPENDENT_AMBULATORY_CARE_PROVIDER_SITE_OTHER): Payer: Self-pay | Admitting: Family Medicine

## 2021-08-07 ENCOUNTER — Other Ambulatory Visit: Payer: Self-pay

## 2021-08-07 ENCOUNTER — Ambulatory Visit (INDEPENDENT_AMBULATORY_CARE_PROVIDER_SITE_OTHER): Payer: 59 | Admitting: Family Medicine

## 2021-08-07 VITALS — BP 137/78 | HR 75 | Temp 98.1°F | Ht 64.0 in | Wt 187.0 lb

## 2021-08-07 DIAGNOSIS — E559 Vitamin D deficiency, unspecified: Secondary | ICD-10-CM

## 2021-08-07 DIAGNOSIS — E66811 Obesity, class 1: Secondary | ICD-10-CM

## 2021-08-07 DIAGNOSIS — Z9189 Other specified personal risk factors, not elsewhere classified: Secondary | ICD-10-CM | POA: Diagnosis not present

## 2021-08-07 DIAGNOSIS — E669 Obesity, unspecified: Secondary | ICD-10-CM | POA: Diagnosis not present

## 2021-08-07 DIAGNOSIS — R7303 Prediabetes: Secondary | ICD-10-CM | POA: Diagnosis not present

## 2021-08-07 DIAGNOSIS — Z6833 Body mass index (BMI) 33.0-33.9, adult: Secondary | ICD-10-CM | POA: Diagnosis not present

## 2021-08-07 MED ORDER — SAXENDA 18 MG/3ML ~~LOC~~ SOPN: 3.0000 mg | PEN_INJECTOR | Freq: Every day | SUBCUTANEOUS | 0 refills | Status: DC

## 2021-08-07 NOTE — Progress Notes (Signed)
Chief Complaint:   OBESITY Rebekah [name] is here to discuss her progress with her obesity treatment plan along with follow-up of her obesity related diagnoses. Rebekah [name] is on the Category 2 Plan and states she is following her eating plan approximately 25% of the time. Rebekah [name] states she is doing yard work, and lifting 3-4 times per week.   Today's visit was #: 89 Starting weight: 198 lbs Starting date: 07/21/2018 Today's weight: 187 lbs Today's date: 08/07/2021 Total lbs lost to date: 11 Total lbs lost since last in-office visit: 0  Interim History: Rebekah [name]'s last visit was approximately 2 months ago. She has struggled with meal planning and prepping. She is finishing remodeling her kitchen and has had to eat out more. She is ready to get back on track. She has been off Saxenda, but she is ready to restart.  Subjective:   1. Pre-diabetes Rebekah [name]'s last A1c increased to 6.3 off her GLP-1 and eating plan. She is ready to get back on track.  2. Vitamin D deficiency Rebekah [name] is stable on Vit D, and she is due for labs next month. She denies nausea, vomiting, or muscle weakness.  3. At risk for diabetes mellitus Rebekah [name] is at higher than average risk for developing diabetes due to obesity.   Assessment/Plan:   1. Pre-diabetes Rebekah [name] agreed to restart GLP-1 and continue metformin, and we will recheck labs in 3 month. She will continue to work on weight loss, exercise, and decreasing simple carbohydrates to help decrease the risk of diabetes.   2. Vitamin D deficiency Low Vitamin D level contributes to fatigue and are associated with obesity, breast, and colon cancer. Rebekah [name] will continue prescription Vitamin D 50,000 IU every week, and we will recheck labs in next month. She will follow-up for routine testing of Vitamin D, at least 2-3 times per year to avoid over-replacement.  3. At risk for diabetes mellitus Rebekah [name] was given approximately 15 minutes of diabetes education and counseling today. We  discussed intensive lifestyle modifications today with an emphasis on weight loss as well as increasing exercise and decreasing simple carbohydrates in her diet. We also reviewed medication options with an emphasis on risk versus benefit of those discussed.   Repetitive spaced learning was employed today to elicit superior memory formation and behavioral change.  4. Obesity with current BMI 32.2 Rebekah [name] is currently in the action stage of change. As such, her goal is to continue with weight loss efforts. She has agreed to the Category 2 Plan.   We discussed various medication options to help [name] with her weight loss efforts and we both agreed to continue Saxenda, and we will refill for 1 month.  - Liraglutide -Weight Management (SAXENDA) 18 MG/3ML SOPN; Inject 3 mg into the skin daily.  Dispense: 15 mL; Refill: 0  Exercise goals: As is.  Behavioral modification strategies: meal planning and cooking strategies, holiday eating strategies , and celebration eating strategies.  Rebekah [name] has agreed to follow-up with our clinic in 4 weeks. She was informed of the importance of frequent follow-up visits to maximize her success with intensive lifestyle modifications for her multiple health conditions.   Objective:   Blood pressure 137/78, pulse 75, temperature 98.1 F (36.7 C), height 5\' 4"  (1.626 m), weight 187 lb (84.8 kg), SpO2 98 %. Body mass index is 32.1 kg/m.  General: Cooperative, alert, well developed, in no acute distress. HEENT: Conjunctivae and lids unremarkable. Cardiovascular: Regular rhythm.  Lungs: Normal work of breathing. Neurologic: No focal deficits.  Lab Results  Component Value Date   CREATININE 0.91 03/08/2021   BUN 14 03/08/2021   NA 143 03/08/2021   K 4.6 03/08/2021   CL 103 03/08/2021   CO2 24 03/08/2021   Lab Results  Component Value Date   ALT 18 03/08/2021   AST 19 03/08/2021   ALKPHOS 82 03/08/2021   BILITOT <0.2 03/08/2021   Lab Results  Component  Value Date   HGBA1C 6.3 07/25/2021   HGBA1C 5.9 (H) 09/29/2020   HGBA1C 5.8 (H) 03/16/2020   HGBA1C 6.0 (H) 11/09/2019   HGBA1C 5.9 (H) 05/04/2019   Lab Results  Component Value Date   INSULIN 29.9 (H) 03/08/2021   INSULIN 34.2 (H) 09/29/2020   INSULIN 24.6 03/16/2020   INSULIN 21.2 05/04/2019   INSULIN 25.1 (H) 10/30/2018   Lab Results  Component Value Date   TSH 3.83 07/25/2021   Lab Results  Component Value Date   CHOL 129 03/08/2021   HDL 47 03/08/2021   LDLCALC 66 03/08/2021   TRIG 83 03/08/2021   CHOLHDL 2 07/10/2018   Lab Results  Component Value Date   VD25OH 61.8 03/08/2021   VD25OH 44.0 09/29/2020   VD25OH 62.4 03/16/2020   Lab Results  Component Value Date   WBC 10.3 07/25/2021   HGB 12.9 07/25/2021   HCT 39.5 07/25/2021   MCV 84.4 07/25/2021   PLT 359.0 07/25/2021   Lab Results  Component Value Date   IRON 86 01/07/2015   FERRITIN 19.1 01/07/2015   Attestation Statements:   Reviewed by clinician on day of visit: allergies, medications, problem list, medical history, surgical history, family history, social history, and previous encounter notes.   I, Trixie Dredge, am acting as transcriptionist for Dennard Nip, MD.  I have reviewed the above documentation for accuracy and completeness, and I agree with the above. -  Dennard Nip, MD

## 2021-08-09 ENCOUNTER — Other Ambulatory Visit (INDEPENDENT_AMBULATORY_CARE_PROVIDER_SITE_OTHER): Payer: Self-pay | Admitting: Family Medicine

## 2021-08-09 DIAGNOSIS — I10 Essential (primary) hypertension: Secondary | ICD-10-CM

## 2021-08-09 NOTE — Telephone Encounter (Signed)
LAST APPOINTMENT DATE: 08/07/21 NEXT APPOINTMENT DATE: 09/04/21   [facility] - [place], [place] - [address] [address] [facility] [address] [place] [address] Phone: [phone] Fax: [phone]  [facility]  ([place], [place] [facility] [place] [address] [address] Phone: [phone] Fax: [phone]  [place], [place] [address] Phone: [phone] Fax: [phone]  [id number] - [place], [place] [address] [place] [address] Phone: [phone] Fax: [phone]  [facility] ([facility]) - [place], [place] [address] Phone: [phone] Fax: [phone]  Patient is requesting a refill of the following medications: Requested Prescriptions   Pending Prescriptions Disp Refills   hydrochlorothiazide (MICROZIDE) 12.5 MG capsule [Pharmacy Med Name: hydroCHLOROthiazide 12.5 MG Oral Capsule] 90 capsule 3    Sig: TAKE 1 CAPSULE BY MOUTH  DAILY    Date last filled: 05/11/21 Previously prescribed by Dr. Leafy Ro  Lab Results  Component Value Date   HGBA1C 6.3 07/25/2021   HGBA1C 5.9 (H) 09/29/2020   HGBA1C 5.8 (H) 03/16/2020   Lab Results  Component Value Date   LDLCALC 66 03/08/2021   CREATININE 0.91 03/08/2021   Lab Results  Component Value Date   VD25OH 61.8 03/08/2021   VD25OH 44.0 09/29/2020   VD25OH 62.4 03/16/2020    BP Readings from Last 3 Encounters:  08/07/21 137/78  07/25/21 132/82  06/12/21 140/73

## 2021-08-21 ENCOUNTER — Other Ambulatory Visit: Payer: Self-pay

## 2021-08-21 ENCOUNTER — Emergency Department (HOSPITAL_COMMUNITY): Payer: 59

## 2021-08-21 ENCOUNTER — Encounter (HOSPITAL_COMMUNITY): Payer: Self-pay | Admitting: Emergency Medicine

## 2021-08-21 ENCOUNTER — Emergency Department (HOSPITAL_COMMUNITY)
Admission: EM | Admit: 2021-08-21 | Discharge: 2021-08-22 | Disposition: A | Discharge status: Home / Self Care | Payer: 59 | Attending: Emergency Medicine | Admitting: Emergency Medicine

## 2021-08-21 DIAGNOSIS — W01198A Fall on same level from slipping, tripping and stumbling with subsequent striking against other object, initial encounter: Secondary | ICD-10-CM | POA: Diagnosis not present

## 2021-08-21 DIAGNOSIS — S060X0A Concussion without loss of consciousness, initial encounter: Secondary | ICD-10-CM | POA: Insufficient documentation

## 2021-08-21 DIAGNOSIS — Z7982 Long term (current) use of aspirin: Secondary | ICD-10-CM | POA: Insufficient documentation

## 2021-08-21 DIAGNOSIS — Z79899 Other long term (current) drug therapy: Secondary | ICD-10-CM | POA: Insufficient documentation

## 2021-08-21 DIAGNOSIS — Z794 Long term (current) use of insulin: Secondary | ICD-10-CM | POA: Insufficient documentation

## 2021-08-21 DIAGNOSIS — S0990XA Unspecified injury of head, initial encounter: Secondary | ICD-10-CM

## 2021-08-21 DIAGNOSIS — W19XXXA Unspecified fall, initial encounter: Secondary | ICD-10-CM

## 2021-08-21 NOTE — ED Triage Notes (Signed)
Patient fell backwards a few steps on a staircase and fell into the wall.  She hit her head and having right shoulder pain.  Patient denies any neck pain or LOC.  Full recall of the event.

## 2021-08-22 NOTE — ED Provider Notes (Signed)
[facility] EMERGENCY DEPARTMENT Provider Note   CSN: [id number] Arrival date & time: 08/21/21  1721     History Chief Complaint  Patient presents with   Fall   Headache   Nausea    Rebekah [name] is a 60 y.o. female.  Patient is a 60 year old female with a history of hypertension, hyperlipidemia, depression and GERD who is presenting today after a fall and head injury.  She was standing at the top of her stairs carrying 25 pound bag of cat food when she lost her balance falling backwards down 2 stairs and her head going through the sheet rock in her shoulder hitting the baseboard.  Patient denies loss of consciousness but reports directly after the injury she was having nausea, dizziness and some headache.  The headache has persisted but the nausea and dizziness has resolved.  She has been able to walk and denies any numbness or tingling in her arms or legs.  She does not have significant neck pain.  She takes 81 mg of aspirin but no other anticoagulation.  She also reports that she has had some pain in her posterior right shoulder where she hit the baseboard but no pain in her shoulder joint.  She has no trouble breathing or chest pain.  No back pain and no difficulty walking.  No leg pain.  The history is provided by the patient and the spouse.  Fall Associated symptoms include headaches.  Headache     Past Medical History:  Diagnosis Date   Allergy    Back pain    Bowel obstruction ([place]) 2019   Depression    Gall bladder stones    GERD (gastroesophageal reflux disease)    Hyperlipidemia    Hypertension    IBS (irritable bowel syndrome)    IBS (irritable bowel syndrome)    Joint pain    Obesity    Lap band surgery 12-08 Dr Hassell Done   OSA (obstructive sleep apnea)    using a CPAP     Pre-diabetes    REDUCTION MAMMOPLASTY, HX OF 09/22/2007   Qualifier: Diagnosis of  By: Larose Kells MD, [place]     Patient Active Problem List   Diagnosis Date Noted    Metabolic syndrome 18/29/9371   Morbid obesity ([place]) 01/10/2020   Migraines 11/13/2019   Pain in right knee 11/05/2019   Hyperglycemia, unspecified 07/21/2018   SBO (small bowel obstruction) ([place]) 09/28/2017   Dysphagia    PCP NOTES >>>> 09/25/2015   Annual physical exam 09/05/2011   GERD 10/12/2009   Hyperlipidemia 09/22/2007   Anxiety and depression 09/22/2007   Essential hypertension 09/22/2007   Seasonal and perennial allergic rhinitis 09/22/2007   Obstructive sleep apnea 09/22/2007    Past Surgical History:  Procedure Laterality Date   APPENDECTOMY     BREAST REDUCTION SURGERY     CHOLECYSTECTOMY     CLOSED REDUCTION FINGER WITH PERCUTANEOUS PINNING Left 09/14/2014   Procedure: CLOSED REDUCTION PERCUTANEOUS PINNING ;  Surgeon: Leanora Cover, MD;  Location: [facility];  Service: Orthopedics;  Laterality: Left;   ESOPHAGEAL MANOMETRY N/A 09/23/2017   Procedure: ESOPHAGEAL MANOMETRY (EM);  Surgeon: Mauri Pole, MD;  Location: [facility];  Service: Endoscopy;  Laterality: N/A;   Lap Band Adjustment  09/01/2015   Dr. Redmond Pulling   LAPAROSCOPIC GASTRIC BANDING  09/2007   Lap band surgery 12-08 Dr Hassell Done   NASAL SINUS SURGERY     TONSILLECTOMY     x2  OB History   No obstetric history on file.     Family History  Problem Relation Age of Onset   Coronary artery disease Father        CABG, smoker .age onset 35s   Prostate cancer Father    Pulmonary fibrosis Father        and others   High blood pressure Father    High Cholesterol Father    Depression Father    Obesity Father    Breast cancer Mother        age 54   Heart disease Mother        CABG, smoker .age onset 20s   Diverticulitis Mother    High blood pressure Mother    High Cholesterol Mother    Thyroid disease Mother    Depression Mother    Obesity Mother    Heart attack Brother        age 48   Diabetes Neg Hx    Colon cancer Neg Hx    Stomach cancer Neg Hx    Esophageal cancer  Neg Hx    Rectal cancer Neg Hx     Social History   Tobacco Use   Smoking status: Never   Smokeless tobacco: Never  Vaping Use   Vaping Use: Never used  Substance Use Topics   Alcohol use: Yes    Comment: socially    Drug use: No    Home Medications Prior to Admission medications   Medication Sig Start Date End Date Taking? Authorizing Provider  alendronate (FOSAMAX) 70 MG tablet Take 1 tablet (70 mg total) by mouth every 7 (seven) days. Take with a full glass of water on an empty stomach. 06/14/21   Colon Branch, MD  aspirin EC 81 MG tablet Take 81 mg by mouth daily. Swallow whole.    [provider]  Blood Glucose Monitoring Suppl (ONETOUCH VERIO REFLECT) w/Device KIT 1 each by Does not apply route daily. Use OneTouch Verio Reflect to check blood sugar once daily.    [provider]  ELDERBERRY PO  03/22/20   [provider]  ergocalciferol (VITAMIN D2) 1.25 MG (50000 UT) capsule Take 1 capsule (50,000 Units total) by mouth once a week. 05/11/21   Dennard Nip D, MD  Estradiol (DIVIGEL) 1 MG/GM GEL Place 1 mg onto the skin daily.    [provider]  glucose blood test strip Use OneTouch Verio test strips as instructed to check blood sugar daily. 02/27/21   Dennard Nip D, MD  hydrochlorothiazide (MICROZIDE) 12.5 MG capsule TAKE 1 CAPSULE BY MOUTH  DAILY 08/09/21   Dennard Nip D, MD  Insulin Pen Needle (BD PEN NEEDLE NANO 2ND GEN) 32G X 4 MM MISC Use pen's with Saxenda daily 02/27/21   Dennard Nip D, MD  L-Methylfolate-Algae (DEPLIN 7.5) 7.5-90.314 MG CAPS Take 1 tablet by mouth daily. 06/14/21   Colon Branch, MD  Liraglutide -Weight Management (SAXENDA) 18 MG/3ML SOPN Inject 3 mg into the skin daily. 08/07/21   Dennard Nip D, MD  metFORMIN (GLUCOPHAGE) 500 MG tablet TAKE 1 TABLET BY MOUTH  DAILY WITH BREAKFAST 07/18/21   Dennard Nip D, MD  Multiple Vitamin (MULTIVITAMIN) tablet Take 1 tablet by mouth daily. 09/04/13   Colon Branch, MD   nortriptyline (PAMELOR) 75 MG capsule Take 1 capsule (75 mg total) by mouth daily. 06/14/21   Colon Branch, MD  omeprazole (PRILOSEC) 40 MG capsule Take 1 capsule (40 mg total) by mouth  daily. 11/02/20   Colon Branch, MD  OneTouch Delica Lancets 16X MISC Use one lancet as instructed to check blood sugar once daily. 02/27/21   Starlyn Skeans, MD  Probiotic Product (PROBIOTIC-10) CHEW Chew 1 tablet by mouth daily.     [provider]  progesterone (PROMETRIUM) 100 MG capsule Take 100 mg by mouth daily. 08/30/16   [provider]  rosuvastatin (CRESTOR) 10 MG tablet Take 1 tablet (10 mg total) by mouth daily. 06/14/21   Colon Branch, MD    Allergies    Penicillins and Erythromycin  Review of Systems   Review of Systems  Neurological:  Positive for headaches.  All other systems reviewed and are negative.  Physical Exam Updated Vital Signs BP (!) 156/88 (BP Location: Left Arm)   Pulse 73   Temp 97.6 F (36.4 C) (Oral)   Resp 16   SpO2 100%   Physical Exam Vitals and nursing note reviewed.  Constitutional:      General: She is in acute distress.     Appearance: Normal appearance. She is well-developed.  HENT:     Head: Normocephalic and atraumatic.     Right Ear: Tympanic membrane normal.     Left Ear: Tympanic membrane normal.     Nose: Nose normal.     Mouth/Throat:     Mouth: Mucous membranes are moist.  Eyes:     Pupils: Pupils are equal, round, and reactive to light.  Cardiovascular:     Rate and Rhythm: Normal rate and regular rhythm.     Heart sounds: Normal heart sounds. No murmur heard.   No friction rub.  Pulmonary:     Effort: Pulmonary effort is normal.     Breath sounds: Normal breath sounds. No wheezing or rales.  Abdominal:     General: Bowel sounds are normal. There is no distension.     Palpations: Abdomen is soft.     Tenderness: There is no abdominal tenderness. There is no guarding or rebound.  Musculoskeletal:        General: No  tenderness. Normal range of motion.     Right shoulder: No deformity, effusion or bony tenderness. Normal range of motion. Normal strength. Normal pulse.       Arms:     Cervical back: Normal range of motion and neck supple.     Comments: No edema  Skin:    General: Skin is warm and dry.     Findings: No rash.  Neurological:     Mental Status: She is alert and oriented to person, place, and time. Mental status is at baseline.     Cranial Nerves: No cranial nerve deficit.     Sensory: No sensory deficit.     Motor: No weakness.     Gait: Gait normal.  Psychiatric:        Mood and Affect: Mood normal.        Behavior: Behavior normal.    ED Results / Procedures / Treatments   Labs (all labs ordered are listed, but only abnormal results are displayed) Labs Reviewed - No data to display  EKG None  Radiology DG Shoulder Right  Result Date: 08/21/2021 CLINICAL DATA:  Right shoulder pain, fall EXAM: RIGHT SHOULDER - 2+ VIEW COMPARISON:  None. FINDINGS: There is no evidence of fracture or dislocation. There is no evidence of arthropathy or other focal bone abnormality. Soft tissues are unremarkable. IMPRESSION: Negative. Electronically Signed   By: Linwood Dibbles.D.  On: 08/21/2021 21:39   CT HEAD WO CONTRAST (5MM)  Result Date: 08/21/2021 CLINICAL DATA:  Trauma. EXAM: CT HEAD WITHOUT CONTRAST CT CERVICAL SPINE WITHOUT CONTRAST TECHNIQUE: Multidetector CT imaging of the head and cervical spine was performed following the standard protocol without intravenous contrast. Multiplanar CT image reconstructions of the cervical spine were also generated. COMPARISON:  None. FINDINGS: CT HEAD FINDINGS Brain: Sign the ventricles and sulci are appropriate size for patient's age. The gray-white matter discrimination is preserved. There is no acute intracranial hemorrhage. No mass effect or midline shift no extra-axial fluid collection. Vascular: No hyperdense vessel or unexpected calcification.  Skull: Normal. Negative for fracture or focal lesion. Sinuses/Orbits: No acute finding. Other: None CT CERVICAL SPINE FINDINGS Alignment: There is straightening of normal cervical lordosis which may be positional or due to muscle spasm. No acute subluxation. Skull base and vertebrae: No acute fracture. Soft tissues and spinal canal: No prevertebral fluid or swelling. No visible canal hematoma. Disc levels:  Degenerative changes.  No acute findings. Upper chest: Negative. Other: None IMPRESSION: 1. Normal noncontrast CT of the brain. 2. No acute/traumatic cervical spine pathology. Electronically Signed   By: Anner Crete M.D.   On: 08/21/2021 22:34   CT Cervical Spine Wo Contrast  Result Date: 08/21/2021 CLINICAL DATA:  Trauma. EXAM: CT HEAD WITHOUT CONTRAST CT CERVICAL SPINE WITHOUT CONTRAST TECHNIQUE: Multidetector CT imaging of the head and cervical spine was performed following the standard protocol without intravenous contrast. Multiplanar CT image reconstructions of the cervical spine were also generated. COMPARISON:  None. FINDINGS: CT HEAD FINDINGS Brain: Sign the ventricles and sulci are appropriate size for patient's age. The gray-white matter discrimination is preserved. There is no acute intracranial hemorrhage. No mass effect or midline shift no extra-axial fluid collection. Vascular: No hyperdense vessel or unexpected calcification. Skull: Normal. Negative for fracture or focal lesion. Sinuses/Orbits: No acute finding. Other: None CT CERVICAL SPINE FINDINGS Alignment: There is straightening of normal cervical lordosis which may be positional or due to muscle spasm. No acute subluxation. Skull base and vertebrae: No acute fracture. Soft tissues and spinal canal: No prevertebral fluid or swelling. No visible canal hematoma. Disc levels:  Degenerative changes.  No acute findings. Upper chest: Negative. Other: None IMPRESSION: 1. Normal noncontrast CT of the brain. 2. No acute/traumatic cervical  spine pathology. Electronically Signed   By: Anner Crete M.D.   On: 08/21/2021 22:34    Procedures Procedures   Medications Ordered in ED Medications - No data to display  ED Course  I have reviewed the triage vital signs and the nursing notes.  Pertinent labs & imaging results that were available during my care of the patient were reviewed by me and considered in my medical decision making (see chart for details).    MDM Rules/Calculators/A&P                           Patient is a 60 year old female presenting today after a fall that occurred approximately 20 hours ago.  Patient unfortunately has waited in the emergency room for almost 20 hours.  She initially had nausea and dizziness but never vomited.  She still has a dull headache but no other complaints at this time.  She does take 81 mg aspirin but no other anticoagulation.  She had a head CT, cervical image and right shoulder film which were all within normal limits.  Patient is otherwise mentating normally.  Neurologic exam within normal limits.  Patient given concussion precautions and follow-up.  MDM   Amount and/or Complexity of Data Reviewed Tests in the radiology section of CPT: ordered and reviewed Independent visualization of images, tracings, or specimens: yes    Final Clinical Impression(s) / ED Diagnoses Final diagnoses:  Fall, initial encounter  Injury of head, initial encounter  Concussion without loss of consciousness, initial encounter    Rx / DC Orders ED Discharge Orders     None        Blanchie Dessert, MD 08/22/21 1319

## 2021-08-22 NOTE — Discharge Instructions (Addendum)
You can use Tylenol and ibuprofen as needed for headaches.  Make sure you are getting plenty of rest and staying hydrated.  Avoid any activity that causes the headache to get worse.

## 2021-08-30 LAB — HM DIABETES EYE EXAM

## 2021-09-04 ENCOUNTER — Encounter (INDEPENDENT_AMBULATORY_CARE_PROVIDER_SITE_OTHER): Payer: Self-pay | Admitting: Family Medicine

## 2021-09-04 ENCOUNTER — Ambulatory Visit (INDEPENDENT_AMBULATORY_CARE_PROVIDER_SITE_OTHER): Payer: 59 | Admitting: Family Medicine

## 2021-09-04 ENCOUNTER — Other Ambulatory Visit: Payer: Self-pay

## 2021-09-04 VITALS — BP 137/82 | HR 87 | Temp 98.4°F | Ht 64.0 in | Wt 180.0 lb

## 2021-09-04 DIAGNOSIS — E669 Obesity, unspecified: Secondary | ICD-10-CM | POA: Diagnosis not present

## 2021-09-04 DIAGNOSIS — R7303 Prediabetes: Secondary | ICD-10-CM | POA: Diagnosis not present

## 2021-09-04 DIAGNOSIS — Z6833 Body mass index (BMI) 33.0-33.9, adult: Secondary | ICD-10-CM | POA: Diagnosis not present

## 2021-09-04 DIAGNOSIS — Z9189 Other specified personal risk factors, not elsewhere classified: Secondary | ICD-10-CM | POA: Diagnosis not present

## 2021-09-04 DIAGNOSIS — E7849 Other hyperlipidemia: Secondary | ICD-10-CM

## 2021-09-04 MED ORDER — METFORMIN HCL 500 MG PO TABS: 500.0000 mg | ORAL_TABLET | Freq: Every day | ORAL | 0 refills | Status: DC

## 2021-09-04 NOTE — Progress Notes (Signed)
Chief Complaint:   OBESITY Rebekah [name] is here to discuss her progress with her obesity treatment plan along with follow-up of her obesity related diagnoses. Rebekah [name] is on the Category 2 Plan and states she is following her eating plan approximately 40% of the time. [name] states she is active while doing yard work 5 times per week.   Today's visit was #: 82 Starting weight: 198 lbs Starting date: 07/21/2018 Today's weight: 180 lbs Today's date: 09/04/2021 Total lbs lost to date: 18 Total lbs lost since last in-office visit: 7  Interim History: [name] continues to do well with weight loss. She is retired and she is staying active especially in the yard. Her hunger is controlled, but she still has sweet cravings. She is making eating strategies for the holidays.  Subjective:   1. Pre-diabetes Rebekah [name] is stable on metformin, and she is doing well with weight loss. She has not been taking Saxenda regularly, and she is still doing well.  2. Other hyperlipidemia Rebekah [name] is stable on Crestor, and she has no signs of myalgias. She is working on decreasing cholesterol in her diet.  3. At risk for diabetes mellitus Rebekah [name] is at higher than average risk for developing diabetes due to obesity.   Assessment/Plan:   1. Pre-diabetes Rebekah [name] will continue Saxenda as needed, and we will refill metformin for 90 days with no refills. We will recheck labs in 2 months. Rebekah [name] will continue to work on weight loss, exercise, and decreasing simple carbohydrates to help decrease the risk of diabetes.   - metFORMIN (GLUCOPHAGE) 500 MG tablet; Take 1 tablet (500 mg total) by mouth daily with breakfast.  Dispense: 90 tablet; Refill: 0  2. Other hyperlipidemia Cardiovascular risk and specific lipid/LDL goals reviewed. We discussed several lifestyle modifications today. Alizea will continue with her diet, exercise, and medications. We will recheck labs in 2 months. Orders and follow up as documented in patient record.    3. At risk for diabetes mellitus Rebekah [name] was given approximately 15 minutes of diabetes education and counseling today. We discussed intensive lifestyle modifications today with an emphasis on weight loss as well as increasing exercise and decreasing simple carbohydrates in her diet. We also reviewed medication options with an emphasis on risk versus benefit of those discussed.   Repetitive spaced learning was employed today to elicit superior memory formation and behavioral change.  4. Obesity with current BMI 31.0 Rebekah [name] is currently in the action stage of change. As such, her goal is to continue with weight loss efforts. She has agreed to the Category 2 Plan.   Exercise goals: As is.  Behavioral modification strategies: increasing lean protein intake, meal planning and cooking strategies, and holiday eating strategies .  Rebekah [name] has agreed to follow-up with our clinic in 6 to 8 weeks. She was informed of the importance of frequent follow-up visits to maximize her success with intensive lifestyle modifications for her multiple health conditions.   Objective:   Blood pressure 137/82, pulse 87, temperature 98.4 F (36.9 C), temperature source Oral, height 5\' 4"  (1.626 m), weight 180 lb (81.6 kg), SpO2 97 %. Body mass index is 30.9 kg/m.  General: Cooperative, alert, well developed, in no acute distress. HEENT: Conjunctivae and lids unremarkable. Cardiovascular: Regular rhythm.  Lungs: Normal work of breathing. Neurologic: No focal deficits.   Lab Results  Component Value Date   CREATININE 0.91 03/08/2021   BUN 14 03/08/2021   NA 143 03/08/2021   K 4.6 03/08/2021   CL  103 03/08/2021   CO2 24 03/08/2021   Lab Results  Component Value Date   ALT 18 03/08/2021   AST 19 03/08/2021   ALKPHOS 82 03/08/2021   BILITOT <0.2 03/08/2021   Lab Results  Component Value Date   HGBA1C 6.3 07/25/2021   HGBA1C 5.9 (H) 09/29/2020   HGBA1C 5.8 (H) 03/16/2020   HGBA1C 6.0 (H) 11/09/2019    HGBA1C 5.9 (H) 05/04/2019   Lab Results  Component Value Date   INSULIN 29.9 (H) 03/08/2021   INSULIN 34.2 (H) 09/29/2020   INSULIN 24.6 03/16/2020   INSULIN 21.2 05/04/2019   INSULIN 25.1 (H) 10/30/2018   Lab Results  Component Value Date   TSH 3.83 07/25/2021   Lab Results  Component Value Date   CHOL 129 03/08/2021   HDL 47 03/08/2021   LDLCALC 66 03/08/2021   TRIG 83 03/08/2021   CHOLHDL 2 07/10/2018   Lab Results  Component Value Date   VD25OH 61.8 03/08/2021   VD25OH 44.0 09/29/2020   VD25OH 62.4 03/16/2020   Lab Results  Component Value Date   WBC 10.3 07/25/2021   HGB 12.9 07/25/2021   HCT 39.5 07/25/2021   MCV 84.4 07/25/2021   PLT 359.0 07/25/2021   Lab Results  Component Value Date   IRON 86 01/07/2015   FERRITIN 19.1 01/07/2015   Attestation Statements:   Reviewed by clinician on day of visit: allergies, medications, problem list, medical history, surgical history, family history, social history, and previous encounter notes.   I, Trixie Dredge, am acting as transcriptionist for Dennard Nip, MD.  I have reviewed the above documentation for accuracy and completeness, and I agree with the above. -  Dennard Nip, MD

## 2021-10-03 ENCOUNTER — Other Ambulatory Visit (INDEPENDENT_AMBULATORY_CARE_PROVIDER_SITE_OTHER): Payer: Self-pay

## 2021-10-03 ENCOUNTER — Encounter (INDEPENDENT_AMBULATORY_CARE_PROVIDER_SITE_OTHER): Payer: Self-pay

## 2021-10-09 ENCOUNTER — Other Ambulatory Visit (INDEPENDENT_AMBULATORY_CARE_PROVIDER_SITE_OTHER): Payer: Self-pay | Admitting: Family Medicine

## 2021-10-09 DIAGNOSIS — E559 Vitamin D deficiency, unspecified: Secondary | ICD-10-CM

## 2021-10-09 DIAGNOSIS — R7303 Prediabetes: Secondary | ICD-10-CM

## 2021-10-09 MED ORDER — METFORMIN HCL 500 MG PO TABS: 500.0000 mg | ORAL_TABLET | Freq: Every day | ORAL | 0 refills | Status: DC

## 2021-10-09 NOTE — Telephone Encounter (Signed)
Dr.Beasley 

## 2021-10-09 NOTE — Telephone Encounter (Signed)
LAST APPOINTMENT DATE: 09/04/21 NEXT APPOINTMENT DATE: 10/25/21   [place], [place] - [address] [address] [facility] [address] [place] [address] Phone: [phone] Fax: [phone]  [facility] ([place], [facility] [place] [address] [place] [address] Phone: [phone] Fax: [phone]  [place], [place] [address] Phone: [phone] Fax: [phone]  [id number] [place], [place] [address] [place] [address] Phone: [phone] Fax: [phone]  [facility] ([facility] ) - [place], [place] [address] Phone: [phone] Fax: [phone]  Patient is requesting a refill of the following medications: Requested Prescriptions   Pending Prescriptions Disp Refills   metFORMIN (GLUCOPHAGE) 500 MG tablet 90 tablet 0    Sig: Take 1 tablet (500 mg total) by mouth daily with breakfast.    Date last filled: 09/04/21 Previously prescribed by Dr. Leafy Ro  Lab Results  Component Value Date   HGBA1C 6.3 07/25/2021   HGBA1C 5.9 (H) 09/29/2020   HGBA1C 5.8 (H) 03/16/2020   Lab Results  Component Value Date   LDLCALC 66 03/08/2021   CREATININE 0.91 03/08/2021   Lab Results  Component Value Date   VD25OH 61.8 03/08/2021   VD25OH 44.0 09/29/2020   VD25OH 62.4 03/16/2020    BP Readings from Last 3 Encounters:  09/04/21 137/82  08/22/21 (!) 158/85  08/07/21 137/78

## 2021-10-09 NOTE — Telephone Encounter (Signed)
LAST APPOINTMENT DATE: 09/04/21 NEXT APPOINTMENT DATE: 10/25/21   [place], [place] - [address] [address] [facility] [address] [place] [address] Phone: [phone] Fax: [phone]  [facility] ([place], [place] [facility] [place] [address] [place] [address] Phone: [phone] Fax: [phone]  [place], [place] [address] Phone: [phone] Fax: [phone]  [id number] - [place], [place] [address] [place] [address] Phone: [phone] Fax: [phone]  [facility] ([facility] ) - [place], [place] [address] Phone: [phone] Fax: [phone]  Patient is requesting a refill of the following medications: Requested Prescriptions   Pending Prescriptions Disp Refills   ergocalciferol (VITAMIN D2) 1.25 MG (50000 UT) capsule 12 capsule 0    Sig: Take 1 capsule (50,000 Units total) by mouth once a week.    Date last filled: 05/11/21 Previously prescribed by Dr. Leafy Ro  Lab Results  Component Value Date   HGBA1C 6.3 07/25/2021   HGBA1C 5.9 (H) 09/29/2020   HGBA1C 5.8 (H) 03/16/2020   Lab Results  Component Value Date   LDLCALC 66 03/08/2021   CREATININE 0.91 03/08/2021   Lab Results  Component Value Date   VD25OH 61.8 03/08/2021   VD25OH 44.0 09/29/2020   VD25OH 62.4 03/16/2020    BP Readings from Last 3 Encounters:  09/04/21 137/82  08/22/21 (!) 158/85  08/07/21 137/78

## 2021-10-10 ENCOUNTER — Ambulatory Visit (HOSPITAL_BASED_OUTPATIENT_CLINIC_OR_DEPARTMENT_OTHER)
Admission: RE | Admit: 2021-10-10 | Discharge: 2021-10-10 | Disposition: A | Discharge status: Home / Self Care | Payer: 59 | Source: Ambulatory Visit | Attending: Internal Medicine | Admitting: Internal Medicine

## 2021-10-10 ENCOUNTER — Encounter: Payer: Self-pay | Admitting: Internal Medicine

## 2021-10-10 ENCOUNTER — Ambulatory Visit: Payer: 59 | Admitting: Internal Medicine

## 2021-10-10 ENCOUNTER — Other Ambulatory Visit: Payer: Self-pay

## 2021-10-10 VITALS — BP 136/92 | HR 68 | Temp 98.1°F | Resp 16 | Ht 64.0 in

## 2021-10-10 DIAGNOSIS — W19XXXD Unspecified fall, subsequent encounter: Secondary | ICD-10-CM | POA: Diagnosis not present

## 2021-10-10 DIAGNOSIS — M79672 Pain in left foot: Secondary | ICD-10-CM

## 2021-10-10 NOTE — Progress Notes (Signed)
Subjective:    Patient ID: Rebekah [name], female    DOB: January 26, 1961, 60 y.o.   MRN: [id number]  DOS:  10/10/2021 Type of visit - description: Acute The patient had a fall  07-2021, went to the ER, had a head concussion, right shoulder contusion. Those  issues are resolved.  At the time of the emergency room visit, she did not realized but evidently she injuried the L foot at the time of the fall. She started having pain and swelling, mostly at the dorsum of the foot.  She is here because the swelling has decreased but is not gone; also has pain at lateral aspect of the midfoot.  Denies calf pain or swelling  BP Readings from Last 3 Encounters:  10/10/21 (!) 136/92  09/04/21 137/82  08/22/21 (!) 158/85     Review of Systems See above   Past Medical History:  Diagnosis Date   Allergy    Back pain    Bowel obstruction ([place]) 2019   Depression    Gall bladder stones    GERD (gastroesophageal reflux disease)    Hyperlipidemia    Hypertension    IBS (irritable bowel syndrome)    IBS (irritable bowel syndrome)    Joint pain    Obesity    Lap band surgery 12-08 Dr Hassell Done   OSA (obstructive sleep apnea)    using a CPAP     Pre-diabetes    REDUCTION MAMMOPLASTY, HX OF 09/22/2007   Qualifier: Diagnosis of  By: Larose Kells MD, Marianne     Past Surgical History:  Procedure Laterality Date   APPENDECTOMY     BREAST REDUCTION SURGERY     CHOLECYSTECTOMY     CLOSED REDUCTION FINGER WITH PERCUTANEOUS PINNING Left 09/14/2014   Procedure: CLOSED REDUCTION PERCUTANEOUS PINNING ;  Surgeon: Leanora Cover, MD;  Location: [facility];  Service: Orthopedics;  Laterality: Left;   ESOPHAGEAL MANOMETRY N/A 09/23/2017   Procedure: ESOPHAGEAL MANOMETRY (EM);  Surgeon: Mauri Pole, MD;  Location: [facility];  Service: Endoscopy;  Laterality: N/A;   Lap Band Adjustment  09/01/2015   Dr. Redmond Pulling   LAPAROSCOPIC GASTRIC BANDING  09/2007   Lap band surgery 12-08 Dr Hassell Done    NASAL SINUS SURGERY     TONSILLECTOMY     x2    Allergies as of 10/10/2021       Reactions   Penicillins Hives, Rash   Has patient had a PCN reaction causing immediate rash, facial/tongue/throat swelling, SOB or lightheadedness with hypotension: Yes Has patient had a PCN reaction causing severe rash involving mucus membranes or skin necrosis: Yes Has patient had a PCN reaction that required hospitalization: No Has patient had a PCN reaction occurring within the last 10 years: No If all of the above answers are "NO", then may proceed with Cephalosporin use.   Erythromycin Nausea Only   Gastrointestinal issues.        Medication List        Accurate as of October 10, 2021 10:03 AM. If you have any questions, ask your nurse or doctor.          alendronate 70 MG tablet Commonly known as: FOSAMAX Take 1 tablet (70 mg total) by mouth every 7 (seven) days. Take with a full glass of water on an empty stomach.   aspirin EC 81 MG tablet Take 81 mg by mouth daily. Swallow whole.   BD Pen Needle Nano 2nd Gen 32G X 4 MM Misc Generic  drug: Insulin Pen Needle Use pen's with Saxenda daily   Deplin 7.5 7.5-90.314 MG Caps Take 1 tablet by mouth daily.   Divigel 1 MG/GM Gel Generic drug: Estradiol Place 1 mg onto the skin daily.   ELDERBERRY PO   ergocalciferol 1.25 MG (50000 UT) capsule Commonly known as: VITAMIN D2 Take 1 capsule (50,000 Units total) by mouth once a week.   glucose blood test strip Use OneTouch Verio test strips as instructed to check blood sugar daily.   hydrochlorothiazide 12.5 MG capsule Commonly known as: MICROZIDE TAKE 1 CAPSULE BY MOUTH  DAILY   metFORMIN 500 MG tablet Commonly known as: GLUCOPHAGE Take 1 tablet (500 mg total) by mouth daily with breakfast.   multivitamin tablet Take 1 tablet by mouth daily.   nortriptyline 75 MG capsule Commonly known as: PAMELOR Take 1 capsule (75 mg total) by mouth daily.   omeprazole 40 MG  capsule Commonly known as: PRILOSEC Take 1 capsule (40 mg total) by mouth daily.   OneTouch Delica Lancets 91M Misc Use one lancet as instructed to check blood sugar once daily.   OneTouch Verio Reflect w/Device Kit 1 each by Does not apply route daily. Use OneTouch Verio Reflect to check blood sugar once daily.   Probiotic-10 Chew Chew 1 tablet by mouth daily.   progesterone 100 MG capsule Commonly known as: PROMETRIUM Take 100 mg by mouth daily.   rosuvastatin 10 MG tablet Commonly known as: CRESTOR Take 1 tablet (10 mg total) by mouth daily.   Saxenda 18 MG/3ML Sopn Generic drug: Liraglutide -Weight Management Inject 3 mg into the skin daily.           Objective:   Physical Exam BP (!) 136/92 (BP Location: Left Arm, Patient Position: Sitting, Cuff Size: Small)    Pulse 68    Temp 98.1 F (36.7 C) (Oral)    Resp 16    Ht _0  (1.626 m)    SpO2 97%    BMI 30.90 kg/m  General:   Well developed, NAD, BMI noted. HEENT:  Normocephalic . Face symmetric, atraumatic  Lower extremities:  R foot: Normal L foot:  Good pedal pulses, good capillary refill, ankle normal to inspection and palpation and range of motion normal. Midfoot: Minimal puffiness, no redness.  No deformities, slightly TTP at the lateral aspect of the midfoot. Calves: Symmetric, nontender Skin: Not pale. Not jaundice Neurologic:  alert & oriented X3.  Speech normal, gait appropriate for age and unassisted Psych--  Cognition and judgment appear intact.  Cooperative with normal attention span and concentration.  Behavior appropriate. No anxious or depressed appearing.      Assessment    Assessment  Prediabetes HTN Hyperlipidemia: lipitor aches (2018) Anxiety, depression (chronic sx, occ suicidal ideas, h/o self cutting). Used to see Dr Casimiro Needle GERD Migraines  Obesity, lap band surgery 2008 OSA: not using CPAP as off 06/2020 Acne (doxy) ++FH CAD: F, M , B MI age 21, G-parents  SBO  09-2017  PLAN: Midfoot pain: After a fall 10- 2022, went to the ER, notes and x-rays reviewed. Only remaining symptom is midfoot pain, apparently she hyperflexed at the ankle. Exam is benign, will get an x-ray, otherwise conservative treatment with Tylenol/Advil, use comfortable and flat shoes.  Call if not better.   This visit occurred during the SARS-CoV-2 public health emergency.  Safety protocols were in place, including screening questions prior to the visit, additional usage of staff PPE, and extensive cleaning of exam room while observing  appropriate contact time as indicated for disinfecting solutions.

## 2021-10-10 NOTE — Patient Instructions (Signed)
Stop by the first floor and get an x-ray of the L foot.  Okay to take Tylenol or ibuprofen as needed for pain  Call if not gradually better in the next 2 or 3-week

## 2021-10-11 NOTE — Assessment & Plan Note (Signed)
Midfoot pain: After a fall 10- 2022, went to the ER, notes and x-rays reviewed. Only remaining symptom is midfoot pain, apparently she hyperflexed at the ankle. Exam is benign, will get an x-ray, otherwise conservative treatment with Tylenol/Advil, use comfortable and flat shoes.  Call if not better.

## 2021-10-25 ENCOUNTER — Encounter (INDEPENDENT_AMBULATORY_CARE_PROVIDER_SITE_OTHER): Payer: Self-pay | Admitting: Family Medicine

## 2021-10-25 ENCOUNTER — Ambulatory Visit (INDEPENDENT_AMBULATORY_CARE_PROVIDER_SITE_OTHER): Payer: 59 | Admitting: Family Medicine

## 2021-10-25 ENCOUNTER — Other Ambulatory Visit: Payer: Self-pay

## 2021-10-25 VITALS — BP 134/80 | HR 79 | Temp 97.8°F | Ht 64.0 in | Wt 185.0 lb

## 2021-10-25 DIAGNOSIS — E669 Obesity, unspecified: Secondary | ICD-10-CM | POA: Diagnosis not present

## 2021-10-25 DIAGNOSIS — E559 Vitamin D deficiency, unspecified: Secondary | ICD-10-CM

## 2021-10-25 DIAGNOSIS — Z9189 Other specified personal risk factors, not elsewhere classified: Secondary | ICD-10-CM | POA: Diagnosis not present

## 2021-10-25 DIAGNOSIS — R7303 Prediabetes: Secondary | ICD-10-CM

## 2021-10-25 DIAGNOSIS — E782 Mixed hyperlipidemia: Secondary | ICD-10-CM | POA: Diagnosis not present

## 2021-10-25 DIAGNOSIS — Z6833 Body mass index (BMI) 33.0-33.9, adult: Secondary | ICD-10-CM

## 2021-10-25 MED ORDER — METFORMIN HCL 500 MG PO TABS: 500.0000 mg | ORAL_TABLET | Freq: Every day | ORAL | 0 refills | Status: DC

## 2021-10-26 LAB — CMP14+EGFR
ALT: 22 IU/L (ref 0–32)
AST: 26 IU/L (ref 0–40)
Albumin/Globulin Ratio: 1.7 (ref 1.2–2.2)
Albumin: 4.6 g/dL (ref 3.8–4.9)
Alkaline Phosphatase: 75 IU/L (ref 44–121)
BUN/Creatinine Ratio: 14 (ref 9–23)
BUN: 12 mg/dL (ref 6–24)
Bilirubin Total: 0.3 mg/dL (ref 0.0–1.2)
CO2: 24 mmol/L (ref 20–29)
Calcium: 9.3 mg/dL (ref 8.7–10.2)
Chloride: 101 mmol/L (ref 96–106)
Creatinine, Ser: 0.85 mg/dL (ref 0.57–1.00)
Globulin, Total: 2.7 g/dL (ref 1.5–4.5)
Glucose: 84 mg/dL (ref 70–99)
Potassium: 4.2 mmol/L (ref 3.5–5.2)
Sodium: 139 mmol/L (ref 134–144)
Total Protein: 7.3 g/dL (ref 6.0–8.5)
eGFR: 79 mL/min/{1.73_m2} (ref >59)

## 2021-10-26 LAB — LIPID PANEL WITH LDL/HDL RATIO
Cholesterol, Total: 149 mg/dL (ref 100–199)
HDL: 48 mg/dL (ref >39)
LDL Chol Calc (NIH): 85 mg/dL (ref 0–99)
LDL/HDL Ratio: 1.8 ratio (ref 0.0–3.2)
Triglycerides: 84 mg/dL (ref 0–149)
VLDL Cholesterol Cal: 16 mg/dL (ref 5–40)

## 2021-10-26 LAB — HEMOGLOBIN A1C
Est. average glucose Bld gHb Est-mCnc: 120 mg/dL
Hgb A1c MFr Bld: 5.8 % — ABNORMAL HIGH (ref 4.8–5.6)

## 2021-10-26 LAB — INSULIN, RANDOM: INSULIN: 32 u[IU]/mL — ABNORMAL HIGH (ref 2.6–24.9)

## 2021-10-26 LAB — VITAMIN D 25 HYDROXY (VIT D DEFICIENCY, FRACTURES): Vit D, 25-Hydroxy: 54.3 ng/mL (ref 30.0–100.0)

## 2021-10-26 NOTE — Progress Notes (Signed)
Chief Complaint:   OBESITY Rebekah [name] is here to discuss her progress with her obesity treatment plan along with follow-up of her obesity related diagnoses. Rebekah [name] is on the Category 2 Plan and states she is following her eating plan approximately 5% of the time. Rebekah [name] states she is active while walking the dog or doing yard work.   Today's visit was #: 7 Starting weight: 198 lbs Starting date: 07/21/2018 Today's weight: 185 lbs Today's date: 10/25/2021 Total lbs lost to date: 7 Total lbs lost since last in-office visit: 0  Interim History: Rebekah [name] did some celebration eating over the holidays, but she is ready to get back on track. She has had extra challenges after retirement and she is working on getting back into her routine. She is ready to start Saxenda.  Subjective:   1. Pre-diabetes Rebekah [name] is working on diet and weight loss, and she is due for labs.  2. Vitamin D deficiency Rebekah [name] is on Vit D, and she is at risk for over-replacement but with no side effects noted.   3. Mixed hyperlipidemia Rebekah [name] is working on diet and exercise, and she is due to have labs checked to monitor her progress.   4. At risk for impaired metabolic function Rebekah [name] is at increased risk for impaired metabolic function if protein decreases.   Assessment/Plan:   1. Pre-diabetes We will check labs today, and we will refill metformin for 90 days with no refills. Rebekah [name] will continue to work on weight loss, exercise, and decreasing simple carbohydrates to help decrease the risk of diabetes.   - metFORMIN (GLUCOPHAGE) 500 MG tablet; Take 1 tablet (500 mg total) by mouth daily with breakfast.  Dispense: 90 tablet; Refill: 0 - CMP14+EGFR - Insulin, random - Hemoglobin A1c  2. Vitamin D deficiency Low Vitamin D level contributes to fatigue and are associated with obesity, breast, and colon cancer. We will check labs today, and Rebekah [name] will follow-up for routine testing of Vitamin D, at least 2-3 times per year to  avoid over-replacement.  - VITAMIN D 25 Hydroxy (Vit-D Deficiency, Fractures)  3. Mixed hyperlipidemia Cardiovascular risk and specific lipid/LDL goals reviewed. We discussed several lifestyle modifications today. Rebekah [name] will continue to work on diet, exercise and weight loss efforts. We will check labs today. Orders and follow up as documented in patient record.   - Lipid Panel With LDL/HDL Ratio  4. At risk for impaired metabolic function Rebekah [name] was given approximately 15 minutes of impaired  metabolic function prevention counseling today. We discussed intensive lifestyle modifications today with an emphasis on specific nutrition and exercise instructions and strategies.   Repetitive spaced learning was employed today to elicit superior memory formation and behavioral change.  5. Obesity with current BMI 31.9 Rebekah [name] is currently in the action stage of change. As such, her goal is to continue with weight loss efforts. She has agreed to change to the Category 1 Plan.   Rebekah [name] agreed to start Saxenda, no refill needed. She is start at 0.6 mg for 2 weeks and then increase to 1.2 mg if she feels she needs to go higher.   Exercise goals: As is.  Behavioral modification strategies: increasing lean protein intake.  Rebekah [name] has agreed to follow-up with our clinic in 4 weeks. She was informed of the importance of frequent follow-up visits to maximize her success with intensive lifestyle modifications for her multiple health conditions.   Objective:   Blood pressure 134/80, pulse 79, temperature 97.8 F (36.6 C), temperature source  Oral, height _0  (1.626 m), weight 185 lb (83.9 kg), SpO2 98 %. Body mass index is 31.76 kg/m.  General: Cooperative, alert, well developed, in no acute distress. HEENT: Conjunctivae and lids unremarkable. Cardiovascular: Regular rhythm.  Lungs: Normal work of breathing. Neurologic: No focal deficits.   Lab Results  Component Value Date   CREATININE 0.85  10/25/2021   BUN 12 10/25/2021   NA 139 10/25/2021   K 4.2 10/25/2021   CL 101 10/25/2021   CO2 24 10/25/2021   Lab Results  Component Value Date   ALT 22 10/25/2021   AST 26 10/25/2021   ALKPHOS 75 10/25/2021   BILITOT 0.3 10/25/2021   Lab Results  Component Value Date   HGBA1C 5.8 (H) 10/25/2021   HGBA1C 6.3 07/25/2021   HGBA1C 5.9 (H) 09/29/2020   HGBA1C 5.8 (H) 03/16/2020   HGBA1C 6.0 (H) 11/09/2019   Lab Results  Component Value Date   INSULIN 32.0 (H) 10/25/2021   INSULIN 29.9 (H) 03/08/2021   INSULIN 34.2 (H) 09/29/2020   INSULIN 24.6 03/16/2020   INSULIN 21.2 05/04/2019   Lab Results  Component Value Date   TSH 3.83 07/25/2021   Lab Results  Component Value Date   CHOL 149 10/25/2021   HDL 48 10/25/2021   LDLCALC 85 10/25/2021   TRIG 84 10/25/2021   CHOLHDL 2 07/10/2018   Lab Results  Component Value Date   VD25OH 54.3 10/25/2021   VD25OH 61.8 03/08/2021   VD25OH 44.0 09/29/2020   Lab Results  Component Value Date   WBC 10.3 07/25/2021   HGB 12.9 07/25/2021   HCT 39.5 07/25/2021   MCV 84.4 07/25/2021   PLT 359.0 07/25/2021   Lab Results  Component Value Date   IRON 86 01/07/2015   FERRITIN 19.1 01/07/2015   Attestation Statements:   Reviewed by clinician on day of visit: allergies, medications, problem list, medical history, surgical history, family history, social history, and previous encounter notes.   I, Trixie Dredge, am acting as transcriptionist for Dennard Nip, MD.  I have reviewed the above documentation for accuracy and completeness, and I agree with the above. -  Dennard Nip, MD

## 2021-11-22 ENCOUNTER — Ambulatory Visit (INDEPENDENT_AMBULATORY_CARE_PROVIDER_SITE_OTHER): Payer: 59 | Admitting: Family Medicine

## 2021-12-21 ENCOUNTER — Ambulatory Visit (INDEPENDENT_AMBULATORY_CARE_PROVIDER_SITE_OTHER): Payer: 59 | Admitting: Family Medicine

## 2021-12-25 ENCOUNTER — Encounter: Payer: Self-pay | Admitting: Internal Medicine

## 2021-12-25 DIAGNOSIS — M79672 Pain in left foot: Secondary | ICD-10-CM

## 2021-12-25 DIAGNOSIS — W19XXXD Unspecified fall, subsequent encounter: Secondary | ICD-10-CM

## 2022-01-01 ENCOUNTER — Other Ambulatory Visit: Payer: Self-pay

## 2022-01-01 ENCOUNTER — Encounter: Payer: Self-pay | Admitting: Internal Medicine

## 2022-01-01 ENCOUNTER — Ambulatory Visit: Payer: 59 | Admitting: Orthopedic Surgery

## 2022-01-01 DIAGNOSIS — G5762 Lesion of plantar nerve, left lower limb: Secondary | ICD-10-CM | POA: Diagnosis not present

## 2022-01-01 MED ORDER — OMEPRAZOLE 40 MG PO CPDR: 40.0000 mg | DELAYED_RELEASE_CAPSULE | Freq: Every day | ORAL | 1 refills | Status: DC

## 2022-01-02 ENCOUNTER — Encounter: Payer: Self-pay | Admitting: Orthopedic Surgery

## 2022-01-02 DIAGNOSIS — G5762 Lesion of plantar nerve, left lower limb: Secondary | ICD-10-CM | POA: Diagnosis not present

## 2022-01-02 MED ORDER — METHYLPREDNISOLONE ACETATE 40 MG/ML IJ SUSP
40.0000 mg | INTRAMUSCULAR | Status: AC | PRN
  Administered 2022-01-02: 40 mg via INTRA_ARTICULAR

## 2022-01-02 MED ORDER — LIDOCAINE HCL 1 % IJ SOLN
1.0000 mL | INTRAMUSCULAR | Status: AC | PRN
  Administered 2022-01-02: 1 mL

## 2022-01-02 NOTE — Progress Notes (Signed)
? ?Office Visit Note ?  ?Patient: Rebekah [name]           ?Date of Birth: 1961/06/15           ?MRN: [id number] ?Visit Date: 01/01/2022 ?             ?Requested by: Colon Branch, MD ?[address] ?[address] ?[place],  [place] [address] ?PCP: Colon Branch, MD ? ?Chief Complaint  ?Patient presents with  ? Left Foot - Pain  ? ? ? ? ?HPI: ?Patient is a 61 year old woman who is seen for initial evaluation for burning dorsal left foot pain.  Patient states she initially injured her foot on October 31 when she fell backwards down some steps.  Radiographs obtained in December were normal.  Patient complains of pain over the third webspace.  She states it feels like a knife is going through her foot. ? ?Assessment & Plan: ?Visit Diagnoses:  ?1. Morton's neuroma of left foot   ? ? ?Plan: The neuroma was injected.  Discussed that she may require several injections and rarely requires a resection of the neuroma. ? ?Follow-Up Instructions: Return if symptoms worsen or fail to improve.  ? ?Ortho Exam ? ?Patient is alert, oriented, no adenopathy, well-dressed, normal affect, normal respiratory effort. ?Examination patient has a good dorsalis pedis pulse.  She has pain reproduced with lateral compression of the metatarsal heads.  She is maximally tender to palpation over the third webspace and this reproduces her symptoms.  There is no redness no cellulitis.  Radiographs are normal.  No history of gout. ? ?Imaging: ?No results found. ?No images are attached to the encounter. ? ?Labs: ?Lab Results  ?Component Value Date  ? HGBA1C 5.8 (H) 10/25/2021  ? HGBA1C 6.3 07/25/2021  ? HGBA1C 5.9 (H) 09/29/2020  ? ESRSEDRATE 18 06/14/2016  ? ESRSEDRATE 31 (H) 06/20/2011  ? LABORGA Insignificant Growth 07/04/2012  ? ? ? ?Lab Results  ?Component Value Date  ? ALBUMIN 4.6 10/25/2021  ? ALBUMIN 4.3 03/08/2021  ? ALBUMIN 4.1 09/29/2020  ? ? ?No results found for: MG ?Lab Results  ?Component Value Date  ? VD25OH 54.3 10/25/2021  ? VD25OH 61.8  03/08/2021  ? VD25OH 44.0 09/29/2020  ? ? ?No results found for: PREALBUMIN ?CBC EXTENDED Latest Ref Rng & Units 07/25/2021 02/23/2020 02/20/2020  ?WBC 4.0 - 10.5 K/uL 10.3 8.0 18.0(H)  ?RBC 3.87 - 5.11 Mil/uL 4.68 4.44 5.55(H)  ?HGB 12.0 - 15.0 g/dL 12.9 12.4 15.2(H)  ?HCT 36.0 - 46.0 % 39.5 38.2 48.5(H)  ?PLT 150.0 - 400.0 K/uL 359.0 310 331  ?NEUTROABS 1.4 - 7.7 K/uL 5.9 4.5 -  ?LYMPHSABS 0.7 - 4.0 K/uL 3.6 2.9 -  ? ? ? ?There is no height or weight on file to calculate BMI. ? ?Orders:  ?No orders of the defined types were placed in this encounter. ? ?No orders of the defined types were placed in this encounter. ? ? ? Procedures: ?Small Joint Inj: L intertarsal on 01/02/2022 8:00 AM ?Indications: pain and diagnostic evaluation ?Details: 22 G needle ? ?Spinal Needle: No ? ?Medications: 1 mL lidocaine 1 %; 40 mg methylPREDNISolone acetate 40 MG/ML ?Outcome: tolerated well, no immediate complications ?Procedure, treatment alternatives, risks and benefits explained, specific risks discussed. Consent was given by the patient. Immediately prior to procedure a time out was called to verify the correct patient, procedure, equipment, support staff and site/side marked as required. Patient was prepped and draped in the usual sterile fashion.  ? ? ? ?  Clinical Data: ?No additional findings. ? ?ROS: ? ?All other systems negative, except as noted in the HPI. ?Review of Systems ? ?Objective: ?Vital Signs: There were no vitals taken for this visit. ? ?Specialty Comments:  ?No specialty comments available. ? ?PMFS History: ?Patient Active Problem List  ? Diagnosis Date Noted  ? Metabolic syndrome 40/98/1191  ? Morbid obesity ([place]) 01/10/2020  ? Migraines 11/13/2019  ? Pain in right knee 11/05/2019  ? Hyperglycemia, unspecified 07/21/2018  ? SBO (small bowel obstruction) ([place] [facility]) 09/28/2017  ? Dysphagia   ? PCP NOTES >>>> 09/25/2015  ? Annual physical exam 09/05/2011  ? GERD 10/12/2009  ? Hyperlipidemia 09/22/2007  ? Anxiety and depression  09/22/2007  ? Essential hypertension 09/22/2007  ? Seasonal and perennial allergic rhinitis 09/22/2007  ? Obstructive sleep apnea 09/22/2007  ? ?Past Medical History:  ?Diagnosis Date  ? Allergy   ? Back pain   ? Bowel obstruction ([place]) 2019  ? Depression   ? Gall bladder stones   ? GERD (gastroesophageal reflux disease)   ? Hyperlipidemia   ? Hypertension   ? IBS (irritable bowel syndrome)   ? IBS (irritable bowel syndrome)   ? Joint pain   ? Obesity   ? Lap band surgery 12-08 Dr Hassell Done  ? OSA (obstructive sleep apnea)   ? using a CPAP    ? Pre-diabetes   ? REDUCTION MAMMOPLASTY, HX OF 09/22/2007  ? Qualifier: Diagnosis of  By: Larose Kells MD, [place]  ?Family History  ?Problem Relation Age of Onset  ? Coronary artery disease Father   ?     CABG, smoker .age onset 66s  ? Prostate cancer Father   ? Pulmonary fibrosis Father   ?     and others  ? High blood pressure Father   ? High Cholesterol Father   ? Depression Father   ? Obesity Father   ? Breast cancer Mother   ?     age 70  ? Heart disease Mother   ?     CABG, smoker .age onset 51s  ? Diverticulitis Mother   ? High blood pressure Mother   ? High Cholesterol Mother   ? Thyroid disease Mother   ? Depression Mother   ? Obesity Mother   ? Heart attack Brother   ?     age 64  ? Diabetes Neg Hx   ? Colon cancer Neg Hx   ? Stomach cancer Neg Hx   ? Esophageal cancer Neg Hx   ? Rectal cancer Neg Hx   ?  ?Past Surgical History:  ?Procedure Laterality Date  ? APPENDECTOMY    ? BREAST REDUCTION SURGERY    ? CHOLECYSTECTOMY    ? CLOSED REDUCTION FINGER WITH PERCUTANEOUS PINNING Left 09/14/2014  ? Procedure: CLOSED REDUCTION PERCUTANEOUS PINNING ;  Surgeon: Leanora Cover, MD;  Location: [facility];  Service: Orthopedics;  Laterality: Left;  ? ESOPHAGEAL MANOMETRY N/A 09/23/2017  ? Procedure: ESOPHAGEAL MANOMETRY (EM);  Surgeon: Mauri Pole, MD;  Location: [facility];  Service: Endoscopy;  Laterality: N/A;  ? Lap Band Adjustment  09/01/2015  ? Dr. Redmond Pulling  ?  LAPAROSCOPIC GASTRIC BANDING  09/2007  ? Lap band surgery 12-08 Dr Hassell Done  ? NASAL SINUS SURGERY    ? TONSILLECTOMY    ? x2  ? ?Social History  ? ?Occupational History  ? Occupation: [employer]- works from home  ?  Comment: retired 2022  ?Tobacco Use  ? Smoking status:  Never  ? Smokeless tobacco: Never  ?Vaping Use  ? Vaping Use: Never used  ?Substance and Sexual Activity  ? Alcohol use: Yes  ?  Comment: socially   ? Drug use: No  ? Sexual activity: Yes  ?  Partners: Male  ?  Birth control/protection: None  ? ? ? ? ? ?

## 2022-01-15 ENCOUNTER — Telehealth: Payer: Self-pay | Admitting: Orthopedic Surgery

## 2022-01-15 NOTE — Telephone Encounter (Signed)
Pt had morton's neuroma injection 2 weeks ago please see below and advise.  ?

## 2022-01-15 NOTE — Telephone Encounter (Signed)
Pt called and states that her foot is in more pain now since the injection than before. Please call her.  ? ?CB [phone]  ?

## 2022-01-16 NOTE — Telephone Encounter (Signed)
Can you please call pt and make an appt for follow up with Dr. Sharol Given anytime this week or next week. Its for follow up of a painful morton's neuroma.  ?

## 2022-01-17 ENCOUNTER — Other Ambulatory Visit (INDEPENDENT_AMBULATORY_CARE_PROVIDER_SITE_OTHER): Payer: Self-pay | Admitting: Family Medicine

## 2022-01-17 DIAGNOSIS — R7303 Prediabetes: Secondary | ICD-10-CM

## 2022-01-22 ENCOUNTER — Ambulatory Visit: Payer: 59 | Admitting: Orthopedic Surgery

## 2022-01-22 ENCOUNTER — Encounter: Payer: Self-pay | Admitting: Internal Medicine

## 2022-01-23 ENCOUNTER — Other Ambulatory Visit: Payer: Self-pay | Admitting: Internal Medicine

## 2022-01-23 MED ORDER — ONDANSETRON HCL 8 MG PO TABS: 8.0000 mg | ORAL_TABLET | Freq: Three times a day (TID) | ORAL | 0 refills | Status: DC | PRN

## 2022-01-25 ENCOUNTER — Telehealth: Payer: Self-pay | Admitting: Orthopedic Surgery

## 2022-01-25 NOTE — Telephone Encounter (Signed)
LMOM for pt to return call to sch appt with Sharol Given per my chart message of foot pain after cort injs ?

## 2022-01-29 ENCOUNTER — Encounter: Payer: Self-pay | Admitting: Internal Medicine

## 2022-01-29 ENCOUNTER — Ambulatory Visit: Payer: 59 | Admitting: Internal Medicine

## 2022-01-29 VITALS — BP 126/70 | HR 78 | Temp 98.1°F | Resp 16 | Ht 64.0 in | Wt 184.1 lb

## 2022-01-29 DIAGNOSIS — R110 Nausea: Secondary | ICD-10-CM | POA: Diagnosis not present

## 2022-01-29 DIAGNOSIS — R197 Diarrhea, unspecified: Secondary | ICD-10-CM | POA: Diagnosis not present

## 2022-01-29 DIAGNOSIS — E782 Mixed hyperlipidemia: Secondary | ICD-10-CM | POA: Diagnosis not present

## 2022-01-29 DIAGNOSIS — M8580 Other specified disorders of bone density and structure, unspecified site: Secondary | ICD-10-CM

## 2022-01-29 LAB — COMPREHENSIVE METABOLIC PANEL
ALT: 21 U/L (ref 0–35)
AST: 21 U/L (ref 0–37)
Albumin: 4.3 g/dL (ref 3.5–5.2)
Alkaline Phosphatase: 59 U/L (ref 39–117)
BUN: 12 mg/dL (ref 6–23)
CO2: 30 mEq/L (ref 19–32)
Calcium: 9.6 mg/dL (ref 8.4–10.5)
Chloride: 104 mEq/L (ref 96–112)
Creatinine, Ser: 0.88 mg/dL (ref 0.40–1.20)
GFR: 71.91 mL/min (ref >60.00)
Glucose, Bld: 80 mg/dL (ref 70–99)
Potassium: 4.9 mEq/L (ref 3.5–5.1)
Sodium: 141 mEq/L (ref 135–145)
Total Bilirubin: 0.4 mg/dL (ref 0.2–1.2)
Total Protein: 7.2 g/dL (ref 6.0–8.3)

## 2022-01-29 LAB — CBC WITH DIFFERENTIAL/PLATELET
Basophils Absolute: 0.1 10*3/uL (ref 0.0–0.1)
Basophils Relative: 0.8 % (ref 0.0–3.0)
Eosinophils Absolute: 0.6 10*3/uL (ref 0.0–0.7)
Eosinophils Relative: 6.4 % — ABNORMAL HIGH (ref 0.0–5.0)
HCT: 40.6 % (ref 36.0–46.0)
Hemoglobin: 13.2 g/dL (ref 12.0–15.0)
Lymphocytes Relative: 39 % (ref 12.0–46.0)
Lymphs Abs: 3.6 10*3/uL (ref 0.7–4.0)
MCHC: 32.4 g/dL (ref 30.0–36.0)
MCV: 85.3 fl (ref 78.0–100.0)
Monocytes Absolute: 0.4 10*3/uL (ref 0.1–1.0)
Monocytes Relative: 4.6 % (ref 3.0–12.0)
Neutro Abs: 4.5 10*3/uL (ref 1.4–7.7)
Neutrophils Relative %: 49.2 % (ref 43.0–77.0)
Platelets: 360 10*3/uL (ref 150.0–400.0)
RBC: 4.76 Mil/uL (ref 3.87–5.11)
RDW: 17.4 % — ABNORMAL HIGH (ref 11.5–15.5)
WBC: 9.1 10*3/uL (ref 4.0–10.5)

## 2022-01-29 MED ORDER — OMEPRAZOLE 40 MG PO CPDR: 40.0000 mg | DELAYED_RELEASE_CAPSULE | Freq: Every day | ORAL | 1 refills | Status: DC

## 2022-01-29 MED ORDER — ROSUVASTATIN CALCIUM 10 MG PO TABS: 10.0000 mg | ORAL_TABLET | Freq: Every day | ORAL | 1 refills | Status: DC

## 2022-01-29 NOTE — Assessment & Plan Note (Addendum)
Episodic nausea, vomiting and diarrhea: ?As described above, she has a history of lap band surgery in 2008 with just in 2016. ?History of appendectomy and cholecystectomy.  Had a EGD 08-2018, Dr. Fuller Plan. ?No hematemesis or blood in the stools. ?Zofran helps the symptoms.  No recent NSAIDs. ?Plan: ?CBC, CMP, GI referral, continue Zofran ?High cholesterol: Controlled, request a refill on rosuvastatin ?Morbid obesity: Last visit with the wellness clinic 10-2021, does not plan to go back.  Was on Saxenda, self stopped many months ago. ?Osteopenia: T score -2.3   09/2020, Rx Fosamax, has not been taking lately, encouraged to do. ?RTC 07-2022 CPX, sooner if needed ?

## 2022-01-29 NOTE — Patient Instructions (Addendum)
We are referring you to the gastroenterology office ?[phone] ? ?Continue Zofran ? ?Good hydration ?  ? ?GO TO THE LAB : Get the blood work   ? ? ?[place], [place] ?Come back for  a physical by 07-2022 ?

## 2022-01-29 NOTE — Progress Notes (Signed)
? ?Subjective:  ? ? Patient ID: Rebekah [name], female    DOB: [birth year], 61 y.o.   MRN: [id number] ? ?DOS:  01/29/2022 ?Type of visit - description: acute ? ?Symptoms a started approximately 6 weeks ago.  Approximately 6-7 episodes. ? ?Reports nausea and vomiting along with diarrhea, episodes happen typically at night and wake her up. ?Her stomach is empty so she has dry heaves.  Stools are watery. ?Denies any blood per rectum. ?She has a history of lap band, no recent adjustment, no dysphagia or odynophagia. ?Not taking any ibuprofen lately. ?Zofran does help. ?Denies vaginal discharge or bleeding. ?No dysuria or gross hematuria ?No headache ? ?Review of Systems ?See above  ? ?Past Medical History:  ?Diagnosis Date  ? Allergy   ? Back pain   ? Bowel obstruction ([place]) 2019  ? Depression   ? Gall bladder stones   ? GERD (gastroesophageal reflux disease)   ? Hyperlipidemia   ? Hypertension   ? IBS (irritable bowel syndrome)   ? IBS (irritable bowel syndrome)   ? Joint pain   ? Obesity   ? Lap band surgery 12-08 Dr Hassell Done  ? OSA (obstructive sleep apnea)   ? using a CPAP    ? Pre-diabetes   ? REDUCTION MAMMOPLASTY, HX OF 09/22/2007  ? Qualifier: Diagnosis of  By: Larose Kells MD, [place] ? ?Past Surgical History:  ?Procedure Laterality Date  ? APPENDECTOMY    ? BREAST REDUCTION SURGERY    ? CHOLECYSTECTOMY    ? CLOSED REDUCTION FINGER WITH PERCUTANEOUS PINNING Left 09/14/2014  ? Procedure: CLOSED REDUCTION PERCUTANEOUS PINNING ;  Surgeon: Leanora Cover, MD;  Location: [facility];  Service: Orthopedics;  Laterality: Left;  ? ESOPHAGEAL MANOMETRY N/A 09/23/2017  ? Procedure: ESOPHAGEAL MANOMETRY (EM);  Surgeon: Mauri Pole, MD;  [facility];  Service: Endoscopy;  Laterality: N/A;  ? Lap Band Adjustment  09/01/2015  ? Dr. Redmond Pulling  ? LAPAROSCOPIC GASTRIC BANDING  09/2007  ? Lap band surgery 12-08 Dr Hassell Done  ? NASAL SINUS SURGERY    ? TONSILLECTOMY    ? x2  ? ? ?Current Outpatient Medications   ?Medication Instructions  ? alendronate (FOSAMAX) 70 mg, Oral, Every 7 days, Take with a full glass of water on an empty stomach.  ? aspirin EC 81 mg, Oral, Daily, Swallow whole.  ? Blood Glucose Monitoring Suppl (ONETOUCH VERIO REFLECT) w/Device KIT 1 each, Daily  ? Divigel 1 mg, Transdermal, Daily  ? ELDERBERRY PO No dose, route, or frequency recorded.  ? ergocalciferol (VITAMIN D2) 50,000 Units, Oral, Weekly  ? glucose blood test strip Use OneTouch Verio test strips as instructed to check blood sugar daily.  ? hydrochlorothiazide (MICROZIDE) 12.5 MG capsule TAKE 1 CAPSULE BY MOUTH  DAILY  ? Insulin Pen Needle (BD PEN NEEDLE NANO 2ND GEN) 32G X 4 MM MISC Use pen's with Saxenda daily  ? L-Methylfolate-Algae (DEPLIN 7.5) 7.5-90.314 MG CAPS 1 tablet, Oral, Daily  ? metFORMIN (GLUCOPHAGE) 500 mg, Oral, Daily with breakfast  ? Multiple Vitamin (MULTIVITAMIN) tablet 1 tablet, Oral, Daily  ? nortriptyline (PAMELOR) 75 mg, Oral, Daily  ? omeprazole (PRILOSEC) 40 mg, Oral, Daily  ? ondansetron (ZOFRAN) 8 mg, Oral, Every 8 hours PRN  ? OneTouch Delica Lancets 09T MISC Use one lancet as instructed to check blood sugar once daily.  ? Probiotic Product (PROBIOTIC-10) CHEW 1 tablet, Oral, Daily  ? progesterone (PROMETRIUM) 100 mg, Oral, Daily  ? rosuvastatin (CRESTOR) 10  mg, Oral, Daily  ? Saxenda 3 mg, Subcutaneous, Daily  ? ? ?   ?Objective:  ? Physical Exam ?BP 126/70 (BP Location: Left Arm, Patient Position: Sitting, Cuff Size: Small)   Pulse 78   Temp 98.1 ?F (36.7 ?C) (Oral)   Resp 16   Ht _0  (1.626 m)   Wt 184 lb 2 oz (83.5 kg)   SpO2 97%   BMI 31.60 kg/m?  ?General:   ?Well developed, NAD, BMI noted. ?HEENT:  ?Normocephalic . Face symmetric, atraumatic ?Lungs:  ?CTA B ?Normal respiratory effort, no intercostal retractions, no accessory muscle use. ?Heart: RRR,  no murmur. ?Abdomen: Soft, nondistended, lap band device noted at the right side of the upper abdomen. ?Lower extremities: no pretibial edema  bilaterally  ?Skin: Not pale. Not jaundice ?Neurologic:  ?alert & oriented X3.  ?Speech normal, gait appropriate for age and unassisted ?Psych--  ?Cognition and judgment appear intact.  ?Cooperative with normal attention span and concentration.  ?Behavior appropriate. ?No anxious or depressed appearing.  ? ?   ?Assessment   ? ? Assessment  ?Prediabetes ?HTN ?Hyperlipidemia: lipitor aches (2018) ?Anxiety, depression (chronic sx, occ suicidal ideas, h/o self cutting). Used to see Dr Casimiro Needle ?GERD ?Migraines  ?Obesity, lap band surgery 2008, adjustment 2016 ?OSA: not using CPAP as off 06/2020 ?Acne (doxy) ?++FH CAD: F, M , B MI age 59, G-parents  ?SBO 09-2017 ? ?PLAN: ?Episodic nausea, vomiting and diarrhea: ?As described above, she has a history of lap band surgery in 2008 with just in 2016. ?History of appendectomy and cholecystectomy.  Had a EGD 08-2018, Dr. Fuller Plan. ?No hematemesis or blood in the stools. ?Zofran helps the symptoms.  No recent NSAIDs. ?Plan: ?CBC, CMP, GI referral, continue Zofran ?High cholesterol: Controlled, request a refill on rosuvastatin ?Morbid obesity: Last visit with the wellness clinic 10-2021, does not plan to go back.  Was on Saxenda, self stopped many months ago. ?Osteopenia: T score -2.3   09/2020, Rx Fosamax, has not been taking lately, encouraged to do. ?RTC 07-2022 CPX, sooner if needed ? ? ?This visit occurred during the SARS-CoV-2 public health emergency.  Safety protocols were in place, including screening questions prior to the visit, additional usage of staff PPE, and extensive cleaning of exam room while observing appropriate contact time as indicated for disinfecting solutions.  ? ?

## 2022-01-31 ENCOUNTER — Other Ambulatory Visit: Payer: Self-pay

## 2022-01-31 MED ORDER — ALENDRONATE SODIUM 70 MG PO TABS
70.0000 mg | ORAL_TABLET | ORAL | 3 refills | Status: AC
Start: 2022-01-31 — End: ?

## 2022-01-31 MED ORDER — NORTRIPTYLINE HCL 75 MG PO CAPS: 75.0000 mg | ORAL_CAPSULE | Freq: Every day | ORAL | 1 refills | Status: DC

## 2022-02-12 ENCOUNTER — Ambulatory Visit (INDEPENDENT_AMBULATORY_CARE_PROVIDER_SITE_OTHER): Payer: 59

## 2022-02-12 ENCOUNTER — Ambulatory Visit: Payer: 59 | Admitting: Orthopedic Surgery

## 2022-02-12 DIAGNOSIS — M79672 Pain in left foot: Secondary | ICD-10-CM | POA: Diagnosis not present

## 2022-02-25 ENCOUNTER — Encounter: Payer: Self-pay | Admitting: Orthopedic Surgery

## 2022-02-25 NOTE — Progress Notes (Signed)
? ?Office Visit Note ?  ?Patient: Rebekah [name]           ?Date of Birth: 12/13/1960           ?MRN: [id number] ?Visit Date: 02/12/2022 ?             ?Requested by: Colon Branch, MD ?[address] ?[address] ?[place],  [place] [address] ?PCP: Colon Branch, MD ? ?Chief Complaint  ?Patient presents with  ? Left Foot - Pain  ?  Pain s/p injection  ? ? ? ? ?HPI: ?Patient is a 61 year old woman who presents in follow-up for left foot pain she is status post a steroid injection.  She states the pain is worse than before.  Pain over the dorsum of the second metatarsal head.  Patient was symptomatic over the third webspace but the injection at this location provided no relief. ? ?Assessment & Plan: ?Visit Diagnoses:  ?1. Pain in left foot   ? ? ?Plan: Patient may be symptomatic from a stress fracture in the second metatarsal.  She is currently on Fosamax her bone mineral density is -2.  Recommended vitamin D3 5000 international units a postoperative shoe.  Repeat three-view radiographs of the left foot at follow-up. ? ?Follow-Up Instructions: Return in about 3 weeks (around 03/05/2022).  ? ?Ortho Exam ? ?Patient is alert, oriented, no adenopathy, well-dressed, normal affect, normal respiratory effort. ?Examination patient has a strong dorsalis pedis pulse she has good dorsiflexion of the ankle she has maximal pain to palpation over the second and third metatarsal heads there is swelling.  There is no pain in the third webspace today. ? ?Imaging: ?No results found. ?No images are attached to the encounter. ? ?Labs: ?Lab Results  ?Component Value Date  ? HGBA1C 5.8 (H) 10/25/2021  ? HGBA1C 6.3 07/25/2021  ? HGBA1C 5.9 (H) 09/29/2020  ? ESRSEDRATE 18 06/14/2016  ? ESRSEDRATE 31 (H) 06/20/2011  ? LABORGA Insignificant Growth 07/04/2012  ? ? ? ?Lab Results  ?Component Value Date  ? ALBUMIN 4.3 01/29/2022  ? ALBUMIN 4.6 10/25/2021  ? ALBUMIN 4.3 03/08/2021  ? ? ?No results found for: MG ?Lab Results  ?Component Value Date  ? VD25OH  54.3 10/25/2021  ? VD25OH 61.8 03/08/2021  ? VD25OH 44.0 09/29/2020  ? ? ?No results found for: PREALBUMIN ? ?  Latest Ref Rng & Units 01/29/2022  ? 10:33 AM 07/25/2021  ?  2:40 PM 02/23/2020  ? 11:30 AM  ?CBC EXTENDED  ?WBC 4.0 - 10.5 K/uL 9.1   10.3   8.0    ?RBC 3.87 - 5.11 Mil/uL 4.76   4.68   4.44    ?Hemoglobin 12.0 - 15.0 g/dL 13.2   12.9   12.4    ?HCT 36.0 - 46.0 % 40.6   39.5   38.2    ?Platelets 150.0 - 400.0 K/uL 360.0   359.0   310    ?NEUT# 1.4 - 7.7 K/uL 4.5   5.9   4.5    ?Lymph# 0.7 - 4.0 K/uL 3.6   3.6   2.9    ? ? ? ?There is no height or weight on file to calculate BMI. ? ?Orders:  ?Orders Placed This Encounter  ?Procedures  ? XR Foot Complete Left  ? ?No orders of the defined types were placed in this encounter. ? ? ? Procedures: ?No procedures performed ? ?Clinical Data: ?No additional findings. ? ?ROS: ? ?All other systems negative, except as noted in  the HPI. ?Review of Systems ? ?Objective: ?Vital Signs: There were no vitals taken for this visit. ? ?Specialty Comments:  ?No specialty comments available. ? ?PMFS History: ?Patient Active Problem List  ? Diagnosis Date Noted  ? Osteopenia 01/29/2022  ? Metabolic syndrome 47/06/6282  ? Morbid obesity ([place]) 01/10/2020  ? Migraines 11/13/2019  ? Hyperglycemia, unspecified 07/21/2018  ? SBO (small bowel obstruction) ([facility]) 09/28/2017  ? Dysphagia   ? PCP NOTES >>>> 09/25/2015  ? Annual physical exam 09/05/2011  ? GERD 10/12/2009  ? Hyperlipidemia 09/22/2007  ? Anxiety and depression 09/22/2007  ? Essential hypertension 09/22/2007  ? Seasonal and perennial allergic rhinitis 09/22/2007  ? Obstructive sleep apnea 09/22/2007  ? ?Past Medical History:  ?Diagnosis Date  ? Allergy   ? Back pain   ? Bowel obstruction ([facility]) 2019  ? Depression   ? Gall bladder stones   ? GERD (gastroesophageal reflux disease)   ? Hyperlipidemia   ? Hypertension   ? IBS (irritable bowel syndrome)   ? IBS (irritable bowel syndrome)   ? Joint pain   ? Obesity   ? Lap band surgery  12-08 Dr Hassell Done  ? OSA (obstructive sleep apnea)   ? using a CPAP    ? Pre-diabetes   ? REDUCTION MAMMOPLASTY, HX OF 09/22/2007  ? Qualifier: Diagnosis of  By: Larose Kells MD, [place]  ?Family History  ?Problem Relation Age of Onset  ? Coronary artery disease Father   ?     CABG, smoker .age onset 71s  ? Prostate cancer Father   ? Pulmonary fibrosis Father   ?     and others  ? High blood pressure Father   ? High Cholesterol Father   ? Depression Father   ? Obesity Father   ? Breast cancer Mother   ?     age 82  ? Heart disease Mother   ?     CABG, smoker .age onset 55s  ? Diverticulitis Mother   ? High blood pressure Mother   ? High Cholesterol Mother   ? Thyroid disease Mother   ? Depression Mother   ? Obesity Mother   ? Heart attack Brother   ?     age 55  ? Diabetes Neg Hx   ? Colon cancer Neg Hx   ? Stomach cancer Neg Hx   ? Esophageal cancer Neg Hx   ? Rectal cancer Neg Hx   ?  ?Past Surgical History:  ?Procedure Laterality Date  ? APPENDECTOMY    ? BREAST REDUCTION SURGERY    ? CHOLECYSTECTOMY    ? CLOSED REDUCTION FINGER WITH PERCUTANEOUS PINNING Left 09/14/2014  ? Procedure: CLOSED REDUCTION PERCUTANEOUS PINNING ;  Surgeon: Leanora Cover, MD;  Location: [facility];  Service: Orthopedics;  Laterality: Left;  ? ESOPHAGEAL MANOMETRY N/A 09/23/2017  ? Procedure: ESOPHAGEAL MANOMETRY (EM);  Surgeon: Mauri Pole, MD;  Location: [facility];  Service: Endoscopy;  Laterality: N/A;  ? Lap Band Adjustment  09/01/2015  ? Dr. Redmond Pulling  ? LAPAROSCOPIC GASTRIC BANDING  09/2007  ? Lap band surgery 12-08 Dr Hassell Done  ? NASAL SINUS SURGERY    ? TONSILLECTOMY    ? x2  ? ?Social History  ? ?Occupational History  ? Occupation: [employer]- works from home  ?  Comment: retired 2022  ?Tobacco Use  ? Smoking status: Never  ? Smokeless tobacco: Never  ?Vaping Use  ? Vaping Use: Never used  ?Substance and Sexual Activity  ?  Alcohol use: Yes  ?  Comment: socially   ? Drug use: No  ? Sexual activity: Yes  ?  Partners: Male   ?  Birth control/protection: None  ? ? ? ? ? ?

## 2022-03-05 ENCOUNTER — Ambulatory Visit (INDEPENDENT_AMBULATORY_CARE_PROVIDER_SITE_OTHER): Payer: 59

## 2022-03-05 ENCOUNTER — Encounter: Payer: Self-pay | Admitting: Orthopedic Surgery

## 2022-03-05 ENCOUNTER — Ambulatory Visit: Payer: 59 | Admitting: Orthopedic Surgery

## 2022-03-05 DIAGNOSIS — M79672 Pain in left foot: Secondary | ICD-10-CM | POA: Diagnosis not present

## 2022-03-05 DIAGNOSIS — M654 Radial styloid tenosynovitis [de Quervain]: Secondary | ICD-10-CM

## 2022-03-05 NOTE — Progress Notes (Signed)
? ?Office Visit Note ?  ?Patient: Rebekah [name]           ?Date of Birth: 07/16/1961           ?MRN: [id number] ?Visit Date: 03/05/2022 ?             ?Requested by: Colon Branch, MD ?[address] ?[address] ?[place],  [place] [address] ?PCP: Colon Branch, MD ? ?Chief Complaint  ?Patient presents with  ? Left Foot - Fracture, Follow-up  ? ? ? ? ?HPI: ?Patient is a 61 year old woman who presents in follow-up for stress fracture second metatarsal left foot.  Patient has been in a postoperative shoe using vitamin D3.  Patient also complains of pain over the first dorsal extensor compartment left wrist. ? ?Assessment & Plan: ?Visit Diagnoses:  ?1. Pain in left foot   ? ? ?Plan: Recommended Achilles stretching stiff soled shoe increase her activities as tolerated.  Recommended Voltaren gel and a wrist splint for the de Quervain's tenosynovitis on the left. ? ?Follow-Up Instructions: Return if symptoms worsen or fail to improve.  ? ?Ortho Exam ? ?Patient is alert, oriented, no adenopathy, well-dressed, normal affect, normal respiratory effort.  Examination of the left wrist patient has pain to palpation over the first dorsal extensor compartment the Finkelstein's test is positive.  Patient's foot is asymptomatic no pain with weightbearing in a postoperative shoe. ? ?Imaging: ?No results found. ?No images are attached to the encounter. ? ?Labs: ?Lab Results  ?Component Value Date  ? HGBA1C 5.8 (H) 10/25/2021  ? HGBA1C 6.3 07/25/2021  ? HGBA1C 5.9 (H) 09/29/2020  ? ESRSEDRATE 18 06/14/2016  ? ESRSEDRATE 31 (H) 06/20/2011  ? LABORGA Insignificant Growth 07/04/2012  ? ? ? ?Lab Results  ?Component Value Date  ? ALBUMIN 4.3 01/29/2022  ? ALBUMIN 4.6 10/25/2021  ? ALBUMIN 4.3 03/08/2021  ? ? ?No results found for: MG ?Lab Results  ?Component Value Date  ? VD25OH 54.3 10/25/2021  ? VD25OH 61.8 03/08/2021  ? VD25OH 44.0 09/29/2020  ? ? ?No results found for: PREALBUMIN ? ?  Latest Ref Rng & Units 01/29/2022  ? 10:33 AM 07/25/2021  ?   2:40 PM 02/23/2020  ? 11:30 AM  ?CBC EXTENDED  ?WBC 4.0 - 10.5 K/uL 9.1   10.3   8.0    ?RBC 3.87 - 5.11 Mil/uL 4.76   4.68   4.44    ?Hemoglobin 12.0 - 15.0 g/dL 13.2   12.9   12.4    ?HCT 36.0 - 46.0 % 40.6   39.5   38.2    ?Platelets 150.0 - 400.0 K/uL 360.0   359.0   310    ?NEUT# 1.4 - 7.7 K/uL 4.5   5.9   4.5    ?Lymph# 0.7 - 4.0 K/uL 3.6   3.6   2.9    ? ? ? ?There is no height or weight on file to calculate BMI. ? ?Orders:  ?Orders Placed This Encounter  ?Procedures  ? XR Foot Complete Left  ? ?No orders of the defined types were placed in this encounter. ? ? ? Procedures: ?No procedures performed ? ?Clinical Data: ?No additional findings. ? ?ROS: ? ?All other systems negative, except as noted in the HPI. ?Review of Systems ? ?Objective: ?Vital Signs: There were no vitals taken for this visit. ? ?Specialty Comments:  ?No specialty comments available. ? ?PMFS History: ?Patient Active Problem List  ? Diagnosis Date Noted  ? Osteopenia 01/29/2022  ?  Metabolic syndrome 44/81/8563  ? Morbid obesity ([facility]) 01/10/2020  ? Migraines 11/13/2019  ? Hyperglycemia, unspecified 07/21/2018  ? SBO (small bowel obstruction) ([facility]) 09/28/2017  ? Dysphagia   ? PCP NOTES >>>> 09/25/2015  ? Annual physical exam 09/05/2011  ? GERD 10/12/2009  ? Hyperlipidemia 09/22/2007  ? Anxiety and depression 09/22/2007  ? Essential hypertension 09/22/2007  ? Seasonal and perennial allergic rhinitis 09/22/2007  ? Obstructive sleep apnea 09/22/2007  ? ?Past Medical History:  ?Diagnosis Date  ? Allergy   ? Back pain   ? Bowel obstruction ([facility]) 2019  ? Depression   ? Gall bladder stones   ? GERD (gastroesophageal reflux disease)   ? Hyperlipidemia   ? Hypertension   ? IBS (irritable bowel syndrome)   ? IBS (irritable bowel syndrome)   ? Joint pain   ? Obesity   ? Lap band surgery 12-08 Dr Hassell Done  ? OSA (obstructive sleep apnea)   ? using a CPAP    ? Pre-diabetes   ? REDUCTION MAMMOPLASTY, HX OF 09/22/2007  ? Qualifier: Diagnosis of  By: Larose Kells MD, [place]  ?Family History  ?Problem Relation Age of Onset  ? Coronary artery disease Father   ?     CABG, smoker .age onset 40s  ? Prostate cancer Father   ? Pulmonary fibrosis Father   ?     and others  ? High blood pressure Father   ? High Cholesterol Father   ? Depression Father   ? Obesity Father   ? Breast cancer Mother   ?     age 38  ? Heart disease Mother   ?     CABG, smoker .age onset 47s  ? Diverticulitis Mother   ? High blood pressure Mother   ? High Cholesterol Mother   ? Thyroid disease Mother   ? Depression Mother   ? Obesity Mother   ? Heart attack Brother   ?     age 39  ? Diabetes Neg Hx   ? Colon cancer Neg Hx   ? Stomach cancer Neg Hx   ? Esophageal cancer Neg Hx   ? Rectal cancer Neg Hx   ?  ?Past Surgical History:  ?Procedure Laterality Date  ? APPENDECTOMY    ? BREAST REDUCTION SURGERY    ? CHOLECYSTECTOMY    ? CLOSED REDUCTION FINGER WITH PERCUTANEOUS PINNING Left 09/14/2014  ? Procedure: CLOSED REDUCTION PERCUTANEOUS PINNING ;  Surgeon: Leanora Cover, MD;  Location: [facility];  Service: Orthopedics;  Laterality: Left;  ? ESOPHAGEAL MANOMETRY N/A 09/23/2017  ? Procedure: ESOPHAGEAL MANOMETRY (EM);  Surgeon: Mauri Pole, MD;  [facility];  Service: Endoscopy;  Laterality: N/A;  ? Lap Band Adjustment  09/01/2015  ? Dr. Redmond Pulling  ? LAPAROSCOPIC GASTRIC BANDING  09/2007  ? Lap band surgery 12-08 Dr Hassell Done  ? NASAL SINUS SURGERY    ? TONSILLECTOMY    ? x2  ? ?Social History  ? ?Occupational History  ? Occupation: [employer]- works from home  ?  Comment: retired 2022  ?Tobacco Use  ? Smoking status: Never  ? Smokeless tobacco: Never  ?Vaping Use  ? Vaping Use: Never used  ?Substance and Sexual Activity  ? Alcohol use: Yes  ?  Comment: socially   ? Drug use: No  ? Sexual activity: Yes  ?  Partners: Male  ?  Birth control/protection: None  ? ? ? ? ? ?

## 2022-03-07 ENCOUNTER — Ambulatory Visit: Payer: 59 | Admitting: Gastroenterology

## 2022-03-21 ENCOUNTER — Encounter: Payer: Self-pay | Admitting: Orthopedic Surgery

## 2022-03-21 ENCOUNTER — Other Ambulatory Visit: Payer: Self-pay | Admitting: Family

## 2022-03-21 MED ORDER — PREDNISONE 10 MG PO TABS: 10.0000 mg | ORAL_TABLET | Freq: Every day | ORAL | 0 refills | Status: DC

## 2022-04-10 ENCOUNTER — Ambulatory Visit: Payer: 59 | Admitting: Gastroenterology

## 2022-04-21 ENCOUNTER — Other Ambulatory Visit: Payer: Self-pay | Admitting: Family

## 2022-05-17 ENCOUNTER — Other Ambulatory Visit: Payer: Self-pay | Admitting: Internal Medicine

## 2022-05-21 ENCOUNTER — Ambulatory Visit: Payer: 59 | Admitting: Orthopedic Surgery

## 2022-05-21 ENCOUNTER — Encounter: Payer: Self-pay | Admitting: Orthopedic Surgery

## 2022-05-21 DIAGNOSIS — M65332 Trigger finger, left middle finger: Secondary | ICD-10-CM

## 2022-05-21 DIAGNOSIS — M654 Radial styloid tenosynovitis [de Quervain]: Secondary | ICD-10-CM

## 2022-05-21 MED ORDER — PREDNISONE 10 MG PO TABS: 10.0000 mg | ORAL_TABLET | Freq: Every day | ORAL | 0 refills | Status: DC

## 2022-05-21 NOTE — Progress Notes (Signed)
Office Visit Note   Patient: Rebekah [name]           Date of Birth: [birth year]           MRN: [id number] Visit Date: 05/21/2022              Requested by: Colon Branch, [address] [place],  [place] [address] PCP: Colon Branch, MD  Chief Complaint  Patient presents with   Left Foot - Follow-up      HPI: Patient is a 61 year old woman who presents with 3 separate issues.  #1 she is status post stress reaction left foot second metatarsal she has been doing Achilles stretching stiff soled shoe Voltaren gel she states that the foot is no longer symptomatic.  Patient states that she has had acute triggering of the left long finger as well as de Quervain's tenosynovitis of the first dorsal extensor compartment left hand she has been wearing a thumb spica splint.  She states the symptoms did get better with prednisone.  Assessment & Plan: Visit Diagnoses:  1. De Quervain's disease (tenosynovitis)   2. Trigger middle finger of left hand     Plan: We will refill a prescription for prednisone.  Patient was not interested in injection.  She will use the thumb spica splint at work to protect the first dorsal extensor compartment.  Follow-Up Instructions: Return in about 4 weeks (around 06/18/2022).   Ortho Exam  Patient is alert, oriented, no adenopathy, well-dressed, normal affect, normal respiratory effort. Examination patient has triggering of the left long finger tenderness to palpation at the A1 pulley.  She has a positive Finkelstein's test with pain over the first dorsal extensor compartment.  Imaging: No results found. No images are attached to the encounter.  Labs: Lab Results  Component Value Date   HGBA1C 5.8 (H) 10/25/2021   HGBA1C 6.3 07/25/2021   HGBA1C 5.9 (H) 09/29/2020   ESRSEDRATE 18 06/14/2016   ESRSEDRATE 31 (H) 06/20/2011   LABORGA Insignificant Growth 07/04/2012     Lab Results  Component Value Date   ALBUMIN 4.3 01/29/2022   ALBUMIN  4.6 10/25/2021   ALBUMIN 4.3 03/08/2021    No results found for: "MG" Lab Results  Component Value Date   VD25OH 54.3 10/25/2021   VD25OH 61.8 03/08/2021   VD25OH 44.0 09/29/2020    No results found for: "PREALBUMIN"    Latest Ref Rng & Units 01/29/2022   10:33 AM 07/25/2021    2:40 PM 02/23/2020   11:30 AM  CBC EXTENDED  WBC 4.0 - 10.5 K/uL 9.1  10.3  8.0   RBC 3.87 - 5.11 Mil/uL 4.76  4.68  4.44   Hemoglobin 12.0 - 15.0 g/dL 13.2  12.9  12.4   HCT 36.0 - 46.0 % 40.6  39.5  38.2   Platelets 150.0 - 400.0 K/uL 360.0  359.0  310   NEUT# 1.4 - 7.7 K/uL 4.5  5.9  4.5   Lymph# 0.7 - 4.0 K/uL 3.6  3.6  2.9      There is no height or weight on file to calculate BMI.  Orders:  No orders of the defined types were placed in this encounter.  Meds ordered this encounter  Medications   predniSONE (DELTASONE) 10 MG tablet    Sig: Take 1 tablet (10 mg total) by mouth daily with breakfast.    Dispense:  30 tablet    Refill:  0  Procedures: No procedures performed  Clinical Data: No additional findings.  ROS:  All other systems negative, except as noted in the HPI. Review of Systems  Objective: Vital Signs: There were no vitals taken for this visit.  Specialty Comments:  No specialty comments available.  PMFS History: Patient Active Problem List   Diagnosis Date Noted   Osteopenia 47/06/6282   Metabolic syndrome 66/29/4765   Morbid obesity ([facility]) 01/10/2020   Migraines 11/13/2019   Hyperglycemia, unspecified 07/21/2018   SBO (small bowel obstruction) ([facility]) 09/28/2017   Dysphagia    PCP NOTES >>>> 09/25/2015   Annual physical exam 09/05/2011   GERD 10/12/2009   Hyperlipidemia 09/22/2007   Anxiety and depression 09/22/2007   Essential hypertension 09/22/2007   Seasonal and perennial allergic rhinitis 09/22/2007   Obstructive sleep apnea 09/22/2007   Past Medical History:  Diagnosis Date   Allergy    Back pain    Bowel obstruction ([facility]) 2019   Depression     Gall bladder stones    GERD (gastroesophageal reflux disease)    Hyperlipidemia    Hypertension    IBS (irritable bowel syndrome)    IBS (irritable bowel syndrome)    Joint pain    Obesity    Lap band surgery 12-08 Dr Hassell Done   OSA (obstructive sleep apnea)    using a CPAP     Pre-diabetes    REDUCTION MAMMOPLASTY, HX OF 09/22/2007   Qualifier: Diagnosis of  By: Larose Kells MD, Alda Berthold.     Family History  Problem Relation Age of Onset   Coronary artery disease Father        CABG, smoker .age onset 30s   Prostate cancer Father    Pulmonary fibrosis Father        and others   High blood pressure Father    High Cholesterol Father    Depression Father    Obesity Father    Breast cancer Mother        age 23   Heart disease Mother        CABG, smoker .age onset 58s   Diverticulitis Mother    High blood pressure Mother    High Cholesterol Mother    Thyroid disease Mother    Depression Mother    Obesity Mother    Heart attack Brother        age 31   Diabetes Neg Hx    Colon cancer Neg Hx    Stomach cancer Neg Hx    Esophageal cancer Neg Hx    Rectal cancer Neg Hx     Past Surgical History:  Procedure Laterality Date   APPENDECTOMY     BREAST REDUCTION SURGERY     CHOLECYSTECTOMY     CLOSED REDUCTION FINGER WITH PERCUTANEOUS PINNING Left 09/14/2014   Procedure: CLOSED REDUCTION PERCUTANEOUS PINNING ;  Surgeon: Leanora Cover, MD;  Location: [facility];  Service: Orthopedics;  Laterality: Left;   ESOPHAGEAL MANOMETRY N/A 09/23/2017   Procedure: ESOPHAGEAL MANOMETRY (EM);  Surgeon: Mauri Pole, MD;  [facility];  Service: Endoscopy;  Laterality: N/A;   Lap Band Adjustment  09/01/2015   Dr. Redmond Pulling   LAPAROSCOPIC GASTRIC BANDING  09/2007   Lap band surgery 12-08 Dr Hassell Done   NASAL SINUS SURGERY     TONSILLECTOMY     x2   Social History   Occupational History   Occupation: [employer]- works from home    Comment: retired 2022  Tobacco Use  Smoking status: Never   Smokeless tobacco: Never  Vaping Use   Vaping Use: Never used  Substance and Sexual Activity   Alcohol use: Yes    Comment: socially    Drug use: No   Sexual activity: Yes    Partners: Male    Birth control/protection: None

## 2022-05-30 ENCOUNTER — Encounter (INDEPENDENT_AMBULATORY_CARE_PROVIDER_SITE_OTHER): Payer: Self-pay

## 2022-06-18 ENCOUNTER — Encounter: Payer: Self-pay | Admitting: Orthopedic Surgery

## 2022-06-18 ENCOUNTER — Ambulatory Visit: Payer: 59 | Admitting: Orthopedic Surgery

## 2022-06-18 DIAGNOSIS — M654 Radial styloid tenosynovitis [de Quervain]: Secondary | ICD-10-CM | POA: Diagnosis not present

## 2022-06-18 MED ORDER — LIDOCAINE HCL 1 % IJ SOLN
1.0000 mL | INTRAMUSCULAR | Status: AC | PRN
  Administered 2022-06-18: 1 mL

## 2022-06-18 MED ORDER — METHYLPREDNISOLONE ACETATE 40 MG/ML IJ SUSP
40.0000 mg | INTRAMUSCULAR | Status: AC | PRN
  Administered 2022-06-18: 40 mg

## 2022-06-18 NOTE — Progress Notes (Signed)
Office Visit Note   Patient: Rebekah [name]           Date of Birth: 11-12-1960           MRN: [id number] Visit Date: 06/18/2022              Requested by: Colon Branch, [address] [place],  [place] [address] PCP: Colon Branch, MD  Chief Complaint  Patient presents with   Left Hand - Follow-up      HPI: Patient is a 61 year old woman who presents in follow-up for inflammation first dorsal extensor compartment left wrist.  Patient has tried immobilization and anti-inflammatories without relief.  Assessment & Plan: Visit Diagnoses:  1. De Quervain's disease (tenosynovitis)     Plan: The first dorsal extensor compartment was injected she tolerated this well follow-up as needed.  Follow-Up Instructions: Return if symptoms worsen or fail to improve.   Ortho Exam  Patient is alert, oriented, no adenopathy, well-dressed, normal affect, normal respiratory effort. Examination patient has pain to palpation of the first dorsal extensor compartment the Finkelstein's test is positive.  Imaging: No results found. No images are attached to the encounter.  Labs: Lab Results  Component Value Date   HGBA1C 5.8 (H) 10/25/2021   HGBA1C 6.3 07/25/2021   HGBA1C 5.9 (H) 09/29/2020   ESRSEDRATE 18 06/14/2016   ESRSEDRATE 31 (H) 06/20/2011   LABORGA Insignificant Growth 07/04/2012     Lab Results  Component Value Date   ALBUMIN 4.3 01/29/2022   ALBUMIN 4.6 10/25/2021   ALBUMIN 4.3 03/08/2021    No results found for: "MG" Lab Results  Component Value Date   VD25OH 54.3 10/25/2021   VD25OH 61.8 03/08/2021   VD25OH 44.0 09/29/2020    No results found for: "PREALBUMIN"    Latest Ref Rng & Units 01/29/2022   10:33 AM 07/25/2021    2:40 PM 02/23/2020   11:30 AM  CBC EXTENDED  WBC 4.0 - 10.5 K/uL 9.1  10.3  8.0   RBC 3.87 - 5.11 Mil/uL 4.76  4.68  4.44   Hemoglobin 12.0 - 15.0 g/dL 13.2  12.9  12.4   HCT 36.0 - 46.0 % 40.6  39.5  38.2   Platelets 150.0 -  400.0 K/uL 360.0  359.0  310   NEUT# 1.4 - 7.7 K/uL 4.5  5.9  4.5   Lymph# 0.7 - 4.0 K/uL 3.6  3.6  2.9      There is no height or weight on file to calculate BMI.  Orders:  No orders of the defined types were placed in this encounter.  No orders of the defined types were placed in this encounter.    Procedures: Hand/UE Inj: L extensor compartment 1 for de Quervain's tenosynovitis on 06/18/2022 1:20 PM Indications: therapeutic and diagnostic Details: 22 G needle, dorsal approach Medications: 1 mL lidocaine 1 %; 40 mg methylPREDNISolone acetate 40 MG/ML Outcome: tolerated well, no immediate complications Procedure, treatment alternatives, risks and benefits explained, specific risks discussed. Consent was given by the patient. Immediately prior to procedure a time out was called to verify the correct patient, procedure, equipment, support staff and site/side marked as required. Patient was prepped and draped in the usual sterile fashion.      Clinical Data: No additional findings.  ROS:  All other systems negative, except as noted in the HPI. Review of Systems  Objective: Vital Signs: There were no vitals taken for this visit.  Specialty Comments:  No specialty comments available.  PMFS History: Patient Active Problem List   Diagnosis Date Noted   Osteopenia 09/98/3382   Metabolic syndrome 50/53/9767   Morbid obesity ([facility]) 01/10/2020   Migraines 11/13/2019   Hyperglycemia, unspecified 07/21/2018   SBO (small bowel obstruction) ([facility]) 09/28/2017   Dysphagia    PCP NOTES >>>> 09/25/2015   Annual physical exam 09/05/2011   GERD 10/12/2009   Hyperlipidemia 09/22/2007   Anxiety and depression 09/22/2007   Essential hypertension 09/22/2007   Seasonal and perennial allergic rhinitis 09/22/2007   Obstructive sleep apnea 09/22/2007   Past Medical History:  Diagnosis Date   Allergy    Back pain    Bowel obstruction ([facility]) 2019   Depression    Gall bladder stones     GERD (gastroesophageal reflux disease)    Hyperlipidemia    Hypertension    IBS (irritable bowel syndrome)    IBS (irritable bowel syndrome)    Joint pain    Obesity    Lap band surgery 12-08 Dr Hassell Done   OSA (obstructive sleep apnea)    using a CPAP     Pre-diabetes    REDUCTION MAMMOPLASTY, HX OF 09/22/2007   Qualifier: Diagnosis of  By: Larose Kells MD, Alda Berthold.     Family History  Problem Relation Age of Onset   Coronary artery disease Father        CABG, smoker .age onset 24s   Prostate cancer Father    Pulmonary fibrosis Father        and others   High blood pressure Father    High Cholesterol Father    Depression Father    Obesity Father    Breast cancer Mother        age 56   Heart disease Mother        CABG, smoker .age onset 43s   Diverticulitis Mother    High blood pressure Mother    High Cholesterol Mother    Thyroid disease Mother    Depression Mother    Obesity Mother    Heart attack Brother        age 74   Diabetes Neg Hx    Colon cancer Neg Hx    Stomach cancer Neg Hx    Esophageal cancer Neg Hx    Rectal cancer Neg Hx     Past Surgical History:  Procedure Laterality Date   APPENDECTOMY     BREAST REDUCTION SURGERY     CHOLECYSTECTOMY     CLOSED REDUCTION FINGER WITH PERCUTANEOUS PINNING Left 09/14/2014   Procedure: CLOSED REDUCTION PERCUTANEOUS PINNING ;  Surgeon: Leanora Cover, MD;  Location: [facility];  Service: Orthopedics;  Laterality: Left;   ESOPHAGEAL MANOMETRY N/A 09/23/2017   Procedure: ESOPHAGEAL MANOMETRY (EM);  Surgeon: Mauri Pole, MD;  [facility];  Service: Endoscopy;  Laterality: N/A;   Lap Band Adjustment  09/01/2015   Dr. Redmond Pulling   LAPAROSCOPIC GASTRIC BANDING  09/2007   Lap band surgery 12-08 Dr Hassell Done   NASAL SINUS SURGERY     TONSILLECTOMY     x2   Social History   Occupational History   Occupation: [employer]- works from home    Comment: retired 2022  Tobacco Use   Smoking status: Never    Smokeless tobacco: Never  Vaping Use   Vaping Use: Never used  Substance and Sexual Activity   Alcohol use: Yes    Comment: socially    Drug use: No   Sexual activity:  Yes    Partners: Male    Birth control/protection: None

## 2022-06-26 ENCOUNTER — Other Ambulatory Visit: Payer: Self-pay | Admitting: Internal Medicine

## 2022-06-27 ENCOUNTER — Other Ambulatory Visit (INDEPENDENT_AMBULATORY_CARE_PROVIDER_SITE_OTHER): Payer: Self-pay | Admitting: Family Medicine

## 2022-06-27 DIAGNOSIS — I10 Essential (primary) hypertension: Secondary | ICD-10-CM

## 2022-07-16 LAB — HM MAMMOGRAPHY

## 2022-07-18 ENCOUNTER — Telehealth: Payer: Self-pay | Admitting: Internal Medicine

## 2022-07-18 NOTE — Telephone Encounter (Signed)
Recv'd records from [facility]  forwarded 1 page to Dr. Kathlene November 9/28/03fbg

## 2022-07-20 ENCOUNTER — Encounter: Payer: Self-pay | Admitting: Internal Medicine

## 2022-07-26 DIAGNOSIS — R87619 Unspecified abnormal cytological findings in specimens from cervix uteri: Secondary | ICD-10-CM | POA: Insufficient documentation

## 2022-07-27 ENCOUNTER — Ambulatory Visit (INDEPENDENT_AMBULATORY_CARE_PROVIDER_SITE_OTHER): Payer: 59 | Admitting: Internal Medicine

## 2022-07-27 ENCOUNTER — Encounter: Payer: Self-pay | Admitting: Internal Medicine

## 2022-07-27 VITALS — BP 126/70 | HR 74 | Temp 98.0°F | Resp 16 | Ht 64.0 in | Wt 193.1 lb

## 2022-07-27 DIAGNOSIS — I10 Essential (primary) hypertension: Secondary | ICD-10-CM

## 2022-07-27 DIAGNOSIS — Z23 Encounter for immunization: Secondary | ICD-10-CM | POA: Diagnosis not present

## 2022-07-27 DIAGNOSIS — Z0000 Encounter for general adult medical examination without abnormal findings: Secondary | ICD-10-CM

## 2022-07-27 DIAGNOSIS — E7849 Other hyperlipidemia: Secondary | ICD-10-CM | POA: Diagnosis not present

## 2022-07-27 DIAGNOSIS — E038 Other specified hypothyroidism: Secondary | ICD-10-CM | POA: Diagnosis not present

## 2022-07-27 DIAGNOSIS — R739 Hyperglycemia, unspecified: Secondary | ICD-10-CM

## 2022-07-27 LAB — COMPREHENSIVE METABOLIC PANEL
ALT: 26 U/L (ref 0–35)
AST: 26 U/L (ref 0–37)
Albumin: 4.5 g/dL (ref 3.5–5.2)
Alkaline Phosphatase: 58 U/L (ref 39–117)
BUN: 13 mg/dL (ref 6–23)
CO2: 30 mEq/L (ref 19–32)
Calcium: 9.6 mg/dL (ref 8.4–10.5)
Chloride: 101 mEq/L (ref 96–112)
Creatinine, Ser: 0.96 mg/dL (ref 0.40–1.20)
GFR: 64.56 mL/min (ref >60.00)
Glucose, Bld: 84 mg/dL (ref 70–99)
Potassium: 4.5 mEq/L (ref 3.5–5.1)
Sodium: 139 mEq/L (ref 135–145)
Total Bilirubin: 0.5 mg/dL (ref 0.2–1.2)
Total Protein: 7.5 g/dL (ref 6.0–8.3)

## 2022-07-27 LAB — LIPID PANEL
Cholesterol: 147 mg/dL (ref 0–200)
HDL: 57.6 mg/dL (ref >39.00)
LDL Cholesterol: 71 mg/dL (ref 0–99)
NonHDL: 89.34
Total CHOL/HDL Ratio: 3
Triglycerides: 93 mg/dL (ref 0.0–149.0)
VLDL: 18.6 mg/dL (ref 0.0–40.0)

## 2022-07-27 LAB — HEMOGLOBIN A1C: Hgb A1c MFr Bld: 6.4 % (ref 4.6–6.5)

## 2022-07-27 LAB — TSH: TSH: 2.83 u[IU]/mL (ref 0.35–5.50)

## 2022-07-27 LAB — T4, FREE: Free T4: 0.69 ng/dL (ref 0.60–1.60)

## 2022-07-27 NOTE — Progress Notes (Signed)
Subjective:    Patient ID: Rebekah [name], female    DOB: [birth year], 61 y.o.   MRN: [id number]  DOS:  07/27/2022 Type of visit - description: CPX  Here for CPX In general feeling well. Still has occasional GI symptoms, nothing unusual.  Review of Systems  Other than above, a 14 point review of systems is negative     Past Medical History:  Diagnosis Date   Allergy    Back pain    Bowel obstruction ([facility]) 2019   Depression    Gall bladder stones    GERD (gastroesophageal reflux disease)    Hyperlipidemia    Hypertension    IBS (irritable bowel syndrome)    IBS (irritable bowel syndrome)    Joint pain    Obesity    Lap band surgery 12-08 Dr Hassell Done   OSA (obstructive sleep apnea)    using a CPAP     Pre-diabetes    REDUCTION MAMMOPLASTY, HX OF 09/22/2007   Qualifier: Diagnosis of  By: Larose Kells MD, [place]     Past Surgical History:  Procedure Laterality Date   APPENDECTOMY     BREAST REDUCTION SURGERY     CHOLECYSTECTOMY     CLOSED REDUCTION FINGER WITH PERCUTANEOUS PINNING Left 09/14/2014   Procedure: CLOSED REDUCTION PERCUTANEOUS PINNING ;  Surgeon: Leanora Cover, MD;  Location: [facility];  Service: Orthopedics;  Laterality: Left;   ESOPHAGEAL MANOMETRY N/A 09/23/2017   Procedure: ESOPHAGEAL MANOMETRY (EM);  Surgeon: Mauri Pole, MD;  [facility];  Service: Endoscopy;  Laterality: N/A;   Lap Band Adjustment  09/01/2015   Dr. Redmond Pulling   LAPAROSCOPIC GASTRIC BANDING  09/2007   Lap band surgery 12-08 Dr Hassell Done   NASAL SINUS SURGERY     TONSILLECTOMY     x2    Current Outpatient Medications  Medication Instructions   alendronate (FOSAMAX) 70 mg, Oral, Every 7 days, Take with a full glass of water on an empty stomach.   aspirin EC 81 mg, Oral, Daily, Swallow whole.   Divigel 1 mg, Transdermal, Daily   ELDERBERRY PO No dose, route, or frequency recorded.   hydrochlorothiazide (MICROZIDE) 12.5 MG capsule TAKE 1 CAPSULE BY MOUTH  DAILY    L-Methylfolate-Algae (DEPLIN 7.5) 7.5-90.314 MG CAPS 1 tablet, Oral, Daily   metFORMIN (GLUCOPHAGE) 500 mg, Oral, Daily with breakfast   Multiple Vitamin (MULTIVITAMIN) tablet 1 tablet, Oral, Daily   nortriptyline (PAMELOR) 75 mg, Oral, Daily   omeprazole (PRILOSEC) 40 mg, Oral, Daily   ondansetron (ZOFRAN) 8 mg, Oral, Every 8 hours PRN   predniSONE (DELTASONE) 10 mg, Oral, Daily   predniSONE (DELTASONE) 10 mg, Oral, Daily with breakfast   Probiotic Product (PROBIOTIC-10) CHEW 1 tablet, Oral, Daily   progesterone (PROMETRIUM) 100 mg, Oral, Daily   rosuvastatin (CRESTOR) 10 mg, Oral, Daily       Objective:   Physical Exam BP 126/70   Pulse 74   Temp 98 F (36.7 C) (Oral)   Resp 16   Ht '5\' 4"'$  (1.626 m)   Wt 193 lb 2 oz (87.6 kg)   SpO2 95%   BMI 33.15 kg/m  General: Well developed, NAD, BMI noted Neck: No  thyromegaly  HEENT:  Normocephalic . Face symmetric, atraumatic Lungs:  CTA B Normal respiratory effort, no intercostal retractions, no accessory muscle use. Heart: RRR,  no murmur.  Abdomen:  Not distended, soft, non-tender. No rebound or rigidity.  Lap band felt at the R UQ Lower extremities:  no pretibial edema bilaterally  Skin: Exposed areas without rash. Not pale. Not jaundice Neurologic:  alert & oriented X3.  Speech normal, gait appropriate for age and unassisted Strength symmetric and appropriate for age.  Psych: Cognition and judgment appear intact.  Cooperative with normal attention span and concentration.  Behavior appropriate. No anxious or depressed appearing.     Assessment     Assessment  Prediabetes HTN Hyperlipidemia: lipitor aches (2018) Anxiety, depression (chronic sx, occ suicidal ideas, h/o self cutting). Used to see Dr Casimiro Needle GERD Migraines  Obesity, lap band surgery 2008, adjustment 2016 OSA: not using CPAP as off 06/2020 Acne (doxy) ++FH CAD: F, M , B MI age 13, G-parents  Osteopenia: T score -2.3   09/2020, Rx Fosamax SBO  09-2017  PLAN: Here for CPX Diabetes: Used to take metformin, weight gain noted, recheck A1c.  She has episodic GI symptoms with or without metformin thus would be okay to restart if needed HTN: BP is great.  Continue HCTZ. Hyperlipidemia: On Crestor.  Has a strong FH CAD, no symptoms, on aspirin, last EKG 2021.  Plan is to check FLP, will set LDL goal of 70. FH CAD: See above Dyspepsia: Episodic nausea and diarrhea with or without metformin on board.  Currently doing well on Zofran as needed Anxiety depression: On Deplin, doing well Obesity: Not seen at the wellness clinic anymore, + weight loss, encouraged healthy diet. Osteopenia: On Fosamax since 2021, occasionally forgets, encourage compliance.  On OTC vitamin D. RTC 4 months

## 2022-07-27 NOTE — Patient Instructions (Addendum)
Check the  blood pressure regularly BP GOAL is between 110/65 and  135/85. If it is consistently higher or lower, let me know  Take vitamin D daily Take Fosamax weekly  You are due for a colonoscopy by November 2023. You can contact the gastroenterology division at [phone]  [facility] : Get the blood work     [facility], [place] back for a checkup in 4 months

## 2022-07-29 ENCOUNTER — Encounter: Payer: Self-pay | Admitting: Internal Medicine

## 2022-07-29 NOTE — Assessment & Plan Note (Signed)
Here for CPX Diabetes: Used to take metformin, weight gain noted, recheck A1c.  She has episodic GI symptoms with or without metformin thus would be okay to restart if needed HTN: BP is great.  Continue HCTZ. Hyperlipidemia: On Crestor.  Has a strong FH CAD, no symptoms, on aspirin, last EKG 2021.  Plan is to check FLP, will set LDL goal of 70. FH CAD: See above Dyspepsia: Episodic nausea and diarrhea with or without metformin on board.  Currently doing well on Zofran as needed Anxiety depression: On Deplin, doing well Obesity: Not seen at the wellness clinic anymore, + weight loss, encouraged healthy diet. Osteopenia: On Fosamax since 2021, occasionally forgets, encourage compliance.  On OTC vitamin D. RTC 4 months

## 2022-07-29 NOTE — Assessment & Plan Note (Signed)
-  Td 6-14 -PNM 23: 09-2015;  Prevnar 05-2016 - shingrix x 2   - Covid shot d/w pt: Declines -Flu shot today -Female care: per gyn, [facility] [phone] ([facility]) -[facility]--08/2012--adenomatous polyp, Dr Fuller Plan, [place]  08/2017 , next 11- 2023, patient aware, see AVS -Diet, exercise: Discussed -Labs: CMP FLP A1c TSH , free T4 (history increased TSH)

## 2022-07-30 ENCOUNTER — Encounter: Payer: Self-pay | Admitting: Gastroenterology

## 2022-07-31 ENCOUNTER — Encounter: Payer: Self-pay | Admitting: Internal Medicine

## 2022-08-01 ENCOUNTER — Other Ambulatory Visit: Payer: Self-pay | Admitting: Internal Medicine

## 2022-08-01 DIAGNOSIS — R7303 Prediabetes: Secondary | ICD-10-CM

## 2022-08-02 MED ORDER — METFORMIN HCL 500 MG PO TABS: 500.0000 mg | ORAL_TABLET | Freq: Every day | ORAL | 1 refills | Status: DC

## 2022-08-06 LAB — HM DIABETES EYE EXAM

## 2022-08-07 ENCOUNTER — Encounter: Payer: Self-pay | Admitting: Internal Medicine

## 2022-08-08 ENCOUNTER — Other Ambulatory Visit: Payer: Self-pay | Admitting: Internal Medicine

## 2022-08-09 ENCOUNTER — Other Ambulatory Visit: Payer: Self-pay | Admitting: Internal Medicine

## 2022-08-09 MED ORDER — EMPAGLIFLOZIN 10 MG PO TABS: 10.0000 mg | ORAL_TABLET | Freq: Every day | ORAL | 0 refills | Status: DC

## 2022-08-09 NOTE — Telephone Encounter (Signed)
Jardiance 10 mg 1 p.o. daily, 90 days, no refill

## 2022-08-09 NOTE — Addendum Note (Signed)
Addended byDamita Dunnings D on: 08/09/2022 01:04 PM   Modules accepted: Orders

## 2022-08-22 ENCOUNTER — Ambulatory Visit (AMBULATORY_SURGERY_CENTER): Payer: Self-pay | Admitting: *Deleted

## 2022-08-22 VITALS — Ht 64.0 in | Wt 191.8 lb

## 2022-08-22 DIAGNOSIS — Z86010 Personal history of colonic polyps: Secondary | ICD-10-CM

## 2022-08-22 MED ORDER — NA SULFATE-K SULFATE-MG SULF 17.5-3.13-1.6 GM/177ML PO SOLN: 1.0000 | Freq: Once | ORAL | 0 refills | Status: AC

## 2022-08-22 NOTE — Progress Notes (Signed)
No egg or soy allergy known to patient  No issues known to pt with past sedation with any surgeries or procedures Patient denies ever being told they had issues or difficulty with intubation  No FH of Malignant Hyperthermia Pt is not on diet pills Pt is not on  home 02  Pt is not on blood thinners  Pt encouraged to use to use Singlecare or Goodrx to reduce cost 2 day Suprep/Miralax prep given  Patient's chart reviewed by Osvaldo Angst CNRA prior to previsit and patient appropriate for the [facility].  Previsit completed and red dot placed by patient's name on their procedure day (on provider's schedule).

## 2022-09-05 ENCOUNTER — Encounter: Payer: Self-pay | Admitting: Gastroenterology

## 2022-09-11 ENCOUNTER — Ambulatory Visit (AMBULATORY_SURGERY_CENTER): Payer: 59 | Admitting: Gastroenterology

## 2022-09-11 ENCOUNTER — Encounter: Payer: Self-pay | Admitting: Gastroenterology

## 2022-09-11 VITALS — BP 136/80 | HR 78 | Temp 97.3°F | Resp 15 | Ht 64.0 in | Wt 191.8 lb

## 2022-09-11 DIAGNOSIS — Z09 Encounter for follow-up examination after completed treatment for conditions other than malignant neoplasm: Secondary | ICD-10-CM | POA: Diagnosis present

## 2022-09-11 DIAGNOSIS — Z86010 Personal history of colonic polyps: Secondary | ICD-10-CM

## 2022-09-11 MED ORDER — SODIUM CHLORIDE 0.9 % IV SOLN: 500.0000 mL | Freq: Once | INTRAVENOUS | Status: DC

## 2022-09-11 NOTE — Progress Notes (Signed)
Pt's states no medical or surgical changes since previsit or office visit. 

## 2022-09-11 NOTE — Patient Instructions (Signed)
YOU HAD AN ENDOSCOPIC PROCEDURE TODAY AT [facility]:   Refer to the procedure report that was given to you for any specific questions about what was found during the examination.  If the procedure report does not answer your questions, please call your gastroenterologist to clarify.  If you requested that your care partner not be given the details of your procedure findings, then the procedure report has been included in a sealed envelope for you to review at your convenience later.  YOU SHOULD EXPECT: Some feelings of bloating in the abdomen. Passage of more gas than usual.  Walking can help get rid of the air that was put into your GI tract during the procedure and reduce the bloating. If you had a lower endoscopy (such as a colonoscopy or flexible sigmoidoscopy) you may notice spotting of blood in your stool or on the toilet paper. If you underwent a bowel prep for your procedure, you may not have a normal bowel movement for a few days.  Please Note:  You might notice some irritation and congestion in your nose or some drainage.  This is from the oxygen used during your procedure.  There is no need for concern and it should clear up in a day or so.  SYMPTOMS TO REPORT IMMEDIATELY:  Following lower endoscopy (colonoscopy or flexible sigmoidoscopy):  Excessive amounts of blood in the stool  Significant tenderness or worsening of abdominal pains  Swelling of the abdomen that is new, acute  Fever of 100F or higher   For urgent or emergent issues, a gastroenterologist can be reached at any hour by calling [phone]. Do not use MyChart messaging for urgent concerns.    DIET:  We do recommend a small meal at first, but then you may proceed to your regular diet.  Drink plenty of fluids but you should avoid alcoholic beverages for 24 hours.  MEDICATIONS: Continue present medications.  FOLLOW UP: Repeat colonoscopy in 10 years for surveillance.  Thank you for allowing [name] to  provide for your healthcare needs today.  ACTIVITY:  You should plan to take it easy for the rest of today and you should NOT DRIVE or use heavy machinery until tomorrow (because of the sedation medicines used during the test).    FOLLOW UP: Our staff will call the number listed on your records the next business day following your procedure.  We will call around 7:15- 8:00 am to check on you and address any questions or concerns that you may have regarding the information given to you following your procedure. If we do not reach you, we will leave a message.     If any biopsies were taken you will be contacted by phone or by letter within the next 1-3 weeks.  Please call us at [phone] if you have not heard about the biopsies in 3 weeks.    SIGNATURES/CONFIDENTIALITY: You and/or your care partner have signed paperwork which will be entered into your electronic medical record.  These signatures attest to the fact that that the information above on your After Visit Summary has been reviewed and is understood.  Full responsibility of the confidentiality of this discharge information lies with you and/or your care-partner.

## 2022-09-11 NOTE — Progress Notes (Signed)
History & Physical  Primary Care Physician:  Colon Branch, MD Primary Gastroenterologist: Lucio Edward, MD  CHIEF COMPLAINT:  Personal history of colon polyps   HPI: Rebekah [name] is a 61 y.o. female with a personal history of a sessile serrated adenoma for colonoscopy.    Past Medical History:  Diagnosis Date   Allergy    Arthritis    Back pain    Bowel obstruction ([place]) 2019   Depression    Gall bladder stones    GERD (gastroesophageal reflux disease)    Hyperlipidemia    Hypertension    IBS (irritable bowel syndrome)    IBS (irritable bowel syndrome)    Joint pain    Obesity    Lap band surgery 12-08 Dr Hassell Done   OSA (obstructive sleep apnea)    using a CPAP     Pre-diabetes    REDUCTION MAMMOPLASTY, HX OF 09/22/2007   Qualifier: Diagnosis of  By: Larose Kells MD, Alda Berthold.    Sleep apnea    doesn't use CPAP    Past Surgical History:  Procedure Laterality Date   APPENDECTOMY     BREAST REDUCTION SURGERY     CHOLECYSTECTOMY     CLOSED REDUCTION FINGER WITH PERCUTANEOUS PINNING Left 09/14/2014   Procedure: CLOSED REDUCTION PERCUTANEOUS PINNING ;  Surgeon: Leanora Cover, MD;  Location: [facility];  Service: Orthopedics;  Laterality: Left;   COLONOSCOPY     ESOPHAGEAL MANOMETRY N/A 09/23/2017   Procedure: ESOPHAGEAL MANOMETRY (EM);  Surgeon: Mauri Pole, MD;  Location: [facility];  Service: Endoscopy;  Laterality: N/A;   Lap Band Adjustment  09/01/2015   Dr. Redmond Pulling   LAPAROSCOPIC GASTRIC BANDING  09/22/2007   Lap band surgery 12-08 Dr Hassell Done   NASAL SINUS SURGERY     TONSILLECTOMY     x2   UPPER GASTROINTESTINAL ENDOSCOPY      Prior to Admission medications   Medication Sig Start Date End Date Taking? Authorizing Provider  alendronate (FOSAMAX) 70 MG tablet Take 1 tablet (70 mg total) by mouth every 7 (seven) days. Take with a full glass of water on an empty stomach. 01/31/22  Yes Colon Branch, MD  aspirin EC 81 MG tablet Take 81 mg by mouth  daily. Swallow whole.   Yes [provider]  ELDERBERRY PO  03/22/20  Yes [provider]  empagliflozin (JARDIANCE) 10 MG TABS tablet Take 1 tablet (10 mg total) by mouth daily before breakfast. 08/09/22  Yes Paz, Alda Berthold, MD  Estradiol (DIVIGEL) 1 MG/GM GEL Place 1 mg onto the skin daily.   Yes [provider]  hydrochlorothiazide (MICROZIDE) 12.5 MG capsule TAKE 1 CAPSULE BY MOUTH  DAILY 08/09/21  Yes Dennard Nip D, MD  L-Methylfolate-Algae (DEPLIN 7.5) 7.5-90.314 MG CAPS TAKE 1 CAPSULE BY MOUTH EVERY DAY 05/17/22  Yes Colon Branch, MD  Multiple Vitamin (MULTIVITAMIN) tablet Take 1 tablet by mouth daily. 09/04/13  Yes Paz, Alda Berthold, MD  nortriptyline (PAMELOR) 75 MG capsule Take 1 capsule (75 mg total) by mouth daily. 01/31/22  Yes Paz, Alda Berthold, MD  omeprazole (PRILOSEC) 40 MG capsule Take 1 capsule (40 mg total) by mouth daily. 08/09/22  Yes Paz, Alda Berthold, MD  ondansetron (ZOFRAN) 8 MG tablet Take 1 tablet (8 mg total) by mouth every 8 (eight) hours as needed for nausea or vomiting. 01/23/22  Yes Colon Branch, MD  Probiotic Product (PROBIOTIC-10) CHEW Chew 1 tablet by mouth daily.  Yes [provider]  progesterone (PROMETRIUM) 100 MG capsule Take 100 mg by mouth daily. 08/30/16  Yes [provider]  rosuvastatin (CRESTOR) 10 MG tablet Take 1 tablet (10 mg total) by mouth daily. 06/26/22  Yes Colon Branch, MD    Current Outpatient Medications  Medication Sig Dispense Refill   alendronate (FOSAMAX) 70 MG tablet Take 1 tablet (70 mg total) by mouth every 7 (seven) days. Take with a full glass of water on an empty stomach. 12 tablet 3   aspirin EC 81 MG tablet Take 81 mg by mouth daily. Swallow whole.     ELDERBERRY PO      empagliflozin (JARDIANCE) 10 MG TABS tablet Take 1 tablet (10 mg total) by mouth daily before breakfast. 90 tablet 0   Estradiol (DIVIGEL) 1 MG/GM GEL Place 1 mg onto the skin daily.     hydrochlorothiazide (MICROZIDE) 12.5 MG capsule TAKE 1  CAPSULE BY MOUTH  DAILY 90 capsule 3   L-Methylfolate-Algae (DEPLIN 7.5) 7.5-90.314 MG CAPS TAKE 1 CAPSULE BY MOUTH EVERY DAY 90 capsule 3   Multiple Vitamin (MULTIVITAMIN) tablet Take 1 tablet by mouth daily. 90 tablet 1   nortriptyline (PAMELOR) 75 MG capsule Take 1 capsule (75 mg total) by mouth daily. 90 capsule 1   omeprazole (PRILOSEC) 40 MG capsule Take 1 capsule (40 mg total) by mouth daily. 90 capsule 3   ondansetron (ZOFRAN) 8 MG tablet Take 1 tablet (8 mg total) by mouth every 8 (eight) hours as needed for nausea or vomiting. 15 tablet 0   Probiotic Product (PROBIOTIC-10) CHEW Chew 1 tablet by mouth daily.      progesterone (PROMETRIUM) 100 MG capsule Take 100 mg by mouth daily.     rosuvastatin (CRESTOR) 10 MG tablet Take 1 tablet (10 mg total) by mouth daily. 90 tablet 1   Current Facility-Administered Medications  Medication Dose Route Frequency Provider Last Rate Last Admin   0.9 %  sodium chloride infusion  500 mL Intravenous Once Ladene Artist, MD        Allergies as of 09/11/2022 - Review Complete 09/11/2022  Allergen Reaction Noted   Penicillins Hives and Rash 09/22/2007   Metformin and related  08/08/2022   Erythromycin Nausea Only 07/31/2011    Family History  Problem Relation Age of Onset   Breast cancer Mother        age 45   Heart disease Mother        CABG, smoker .age onset 29s   Diverticulitis Mother    High blood pressure Mother    High Cholesterol Mother    Thyroid disease Mother    Depression Mother    Obesity Mother    Coronary artery disease Father        CABG, smoker .age onset 23s   Prostate cancer Father    Pulmonary fibrosis Father        and others   High blood pressure Father    High Cholesterol Father    Depression Father    Obesity Father    Heart attack Brother        age 58   Diabetes Neg Hx    Colon cancer Neg Hx    Stomach cancer Neg Hx    Esophageal cancer Neg Hx    Rectal cancer Neg Hx     Social History    Socioeconomic History   Marital status: Married    Spouse name: daniel   Number of children: 0  Years of education: Not on file   Highest education level: Not on file  Occupational History   Occupation: [employer]- works from home    Comment: retired 2022  Tobacco Use   Smoking status: Never   Smokeless tobacco: Never  Vaping Use   Vaping Use: Never used  Substance and Sexual Activity   Alcohol use: Yes    Comment: socially    Drug use: No   Sexual activity: Yes    Partners: Male    Birth control/protection: None, Post-menopausal  Other Topics Concern   Not on file  Social History Narrative   Lives w/ husband   Social Determinants of Health   Financial Resource Strain: Not on file  Food Insecurity: Not on file  Transportation Needs: Not on file  Physical Activity: Not on file  Stress: Not on file  Social Connections: Not on file  Intimate Partner Violence: Not on file    Review of Systems:  All systems reviewed were negative except where noted in HPI.   Physical Exam: General:  Alert, well-developed, in NAD Head:  Normocephalic and atraumatic. Eyes:  Sclera clear, no icterus.   Conjunctiva pink. Ears:  Normal auditory acuity. Mouth:  No deformity or lesions.  Neck:  Supple; no masses . Lungs:  Clear throughout to auscultation.   No wheezes, crackles, or rhonchi. No acute distress. Heart:  Regular rate and rhythm; no murmurs. Abdomen:  Soft, nondistended, nontender. No masses, hepatomegaly. No obvious masses.  Normal bowel .    Rectal:  Deferred   Msk:  Symmetrical without gross deformities.. Pulses:  Normal pulses noted. Extremities:  Without edema. Neurologic:  Alert and  oriented x4;  grossly normal neurologically. Skin:  Intact without significant lesions or rashes. Cervical Nodes:  No significant cervical adenopathy. Psych:  Alert and cooperative. Normal mood and affect.  Impression / Plan:   Personal history of a sessile serrated adenoma for  colonoscopy.  Pricilla Riffle. Fuller Plan  09/11/2022, 1:39 PM See Shea Evans, [facility], to contact our on call provider

## 2022-09-11 NOTE — Op Note (Signed)
[facility] Patient Name: Rebekah [name] Procedure Date: 09/11/2022 1:39 PM MRN: [id number] Endoscopist: Ladene Artist , MD, [id number] Age: 61 Referring MD:  Date of Birth: Jun 23, 1961 Gender: Female Account #: [id number] Procedure:                Colonoscopy Indications:              High risk colon cancer surveillance: Personal                            history of traditional serrated adenoma of the colon Medicines:                Monitored Anesthesia Care Procedure:                Pre-Anesthesia Assessment:                           - Prior to the procedure, a History and Physical                            was performed, and patient medications and                            allergies were reviewed. The patient's tolerance of                            previous anesthesia was also reviewed. The risks                            and benefits of the procedure and the sedation                            options and risks were discussed with the patient.                            All questions were answered, and informed consent                            was obtained. Prior Anticoagulants: The patient has                            taken no anticoagulant or antiplatelet agents. ASA                            Grade Assessment: II - A patient with mild systemic                            disease. After reviewing the risks and benefits,                            the patient was deemed in satisfactory condition to                            undergo the procedure.  After obtaining informed consent, the colonoscope                            was passed under direct vision. Throughout the                            procedure, the patient's blood pressure, pulse, and                            oxygen saturations were monitored continuously. The                            Olympus CF-HQ190L [id number]) Colonoscope was                            introduced  through the anus and advanced to the the                            cecum, identified by appendiceal orifice and                            ileocecal valve. The ileocecal valve, appendiceal                            orifice, and rectum were photographed. The quality                            of the bowel preparation was adequate after                            significant lavage, suction. The colonoscopy was                            somewhat difficult due to significant looping and a                            tortuous colon. The patient tolerated the procedure                            well. Scope In: 1:45:55 PM Scope Out: 2:11:14 PM Scope Withdrawal Time: 0 hours 10 minutes 32 seconds  Total Procedure Duration: 0 hours 25 minutes 19 seconds  Findings:                 The perianal and digital rectal examinations were                            normal.                           The entire examined colon appeared normal on direct                            and retroflexion views. Complications:            No immediate complications. Estimated  blood loss:                            None. Estimated Blood Loss:     Estimated blood loss: none. Impression:               - The entire examined colon is normal on direct and                            retroflexion views.                           - No specimens collected. Recommendation:           - Repeat colonoscopy in 10 years for surveillance.                           - Patient has a contact number available for                            emergencies. The signs and symptoms of potential                            delayed complications were discussed with the                            patient. Return to normal activities tomorrow.                            Written discharge instructions were provided to the                            patient.                           - Resume previous diet.                           - Continue  present medications. Ladene Artist, MD 09/11/2022 2:14:13 PM This report has been signed electronically.

## 2022-09-11 NOTE — Progress Notes (Signed)
Approx 11 minutes of abd pressure needed to get to and stay in cecum ([id number]).  Zofran and Robinul given to try and prevent emesis

## 2022-09-11 NOTE — Progress Notes (Signed)
Report to PACU, RN, vss, BBS= Clear.  

## 2022-09-12 ENCOUNTER — Telehealth: Payer: Self-pay

## 2022-09-12 NOTE — Telephone Encounter (Signed)
  Follow up Call-     09/11/2022   12:34 PM  Call back number  Post procedure Call Back phone  # [phone]  Permission to leave phone message Yes     Patient questions:  Do you have a fever, pain , or abdominal swelling? No. Pain Score  0 *  Have you tolerated food without any problems? Yes.    Have you been able to return to your normal activities? Yes.    Do you have any questions about your discharge instructions: Diet   No. Medications  No. Follow up visit  No.  Do you have questions or concerns about your Care? No.  Actions: * If pain score is 4 or above: No action needed, pain <4.

## 2022-09-29 ENCOUNTER — Encounter: Payer: Self-pay | Admitting: Internal Medicine

## 2022-09-29 DIAGNOSIS — I10 Essential (primary) hypertension: Secondary | ICD-10-CM

## 2022-10-01 ENCOUNTER — Other Ambulatory Visit: Payer: Self-pay

## 2022-10-01 MED ORDER — HYDROCHLOROTHIAZIDE 12.5 MG PO CAPS: 12.5000 mg | ORAL_CAPSULE | Freq: Every day | ORAL | 1 refills | Status: DC

## 2022-10-01 MED ORDER — EMPAGLIFLOZIN 10 MG PO TABS: 10.0000 mg | ORAL_TABLET | Freq: Every day | ORAL | 1 refills | Status: DC

## 2022-10-19 ENCOUNTER — Other Ambulatory Visit: Payer: Self-pay

## 2022-10-23 ENCOUNTER — Ambulatory Visit: Payer: 59 | Admitting: Internal Medicine

## 2022-10-23 VITALS — BP 130/78 | HR 81 | Temp 97.5°F | Resp 16 | Ht 64.0 in | Wt 194.8 lb

## 2022-10-23 DIAGNOSIS — I10 Essential (primary) hypertension: Secondary | ICD-10-CM

## 2022-10-23 DIAGNOSIS — R7303 Prediabetes: Secondary | ICD-10-CM

## 2022-10-23 LAB — HEMOGLOBIN A1C: Hgb A1c MFr Bld: 6.2 % (ref 4.6–6.5)

## 2022-10-23 MED ORDER — HYDROCHLOROTHIAZIDE 12.5 MG PO CAPS: 12.5000 mg | ORAL_CAPSULE | Freq: Every day | ORAL | 1 refills | Status: DC

## 2022-10-23 NOTE — Assessment & Plan Note (Addendum)
ROV Prediabetes: Last A1c increased to 6.4, currently on Jardiance.  Check A1c, further advised with results, consider restart metformin, see LOV High cholesterol: Last LDL 71, well-controlled on crestor. HTN: Currently on HCTZ, ran out recently, RF sent 90 days and 30 days. Morbid obesity: Restarted a healthier diet yesterday,  following plan. Preventive care: Had a colonoscopy 08-2022, UTD on COVID and flu shots RTC 4 months

## 2022-10-23 NOTE — Progress Notes (Signed)
Subjective:    Patient ID: Rebekah [name], female    DOB: 12/12/1960, 62 y.o.   MRN: [id number]  DOS:  10/23/2022 Type of visit - description: f/u  Routine follow-up. In general feels well. We reviewed together her previous labs.  Wt Readings from Last 3 Encounters:  10/23/22 194 lb 12.8 oz (88.4 kg)  09/11/22 191 lb 12.8 oz (87 kg)  08/22/22 191 lb 12.8 oz (87 kg)     Review of Systems See above   Past Medical History:  Diagnosis Date   Allergy    Arthritis    Back pain    Bowel obstruction ([facility]) 2019   Depression    Gall bladder stones    GERD (gastroesophageal reflux disease)    Hyperlipidemia    Hypertension    IBS (irritable bowel syndrome)    IBS (irritable bowel syndrome)    Joint pain    Obesity    Lap band surgery 12-08 Dr Hassell Done   OSA (obstructive sleep apnea)    using a CPAP     Pre-diabetes    REDUCTION MAMMOPLASTY, HX OF 09/22/2007   Qualifier: Diagnosis of  By: Larose Kells MD, Alda Berthold.    Sleep apnea    doesn't use CPAP    Past Surgical History:  Procedure Laterality Date   APPENDECTOMY     BREAST REDUCTION SURGERY     CHOLECYSTECTOMY     CLOSED REDUCTION FINGER WITH PERCUTANEOUS PINNING Left 09/14/2014   Procedure: CLOSED REDUCTION PERCUTANEOUS PINNING ;  Surgeon: Leanora Cover, MD;  Location: [facility];  Service: Orthopedics;  Laterality: Left;   COLONOSCOPY     ESOPHAGEAL MANOMETRY N/A 09/23/2017   Procedure: ESOPHAGEAL MANOMETRY (EM);  Surgeon: Mauri Pole, MD;  Location: [facility];  Service: Endoscopy;  Laterality: N/A;   Lap Band Adjustment  09/01/2015   Dr. Redmond Pulling   LAPAROSCOPIC GASTRIC BANDING  09/22/2007   Lap band surgery 12-08 Dr Hassell Done   NASAL SINUS SURGERY     TONSILLECTOMY     x2   UPPER GASTROINTESTINAL ENDOSCOPY      Current Outpatient Medications  Medication Instructions   alendronate (FOSAMAX) 70 mg, Oral, Every 7 days, Take with a full glass of water on an empty stomach.   aspirin EC 81 mg, Oral,  Daily, Swallow whole.   Divigel 1 mg, Transdermal, Daily   ELDERBERRY PO No dose, route, or frequency recorded.   empagliflozin (JARDIANCE) 10 mg, Oral, Daily before breakfast   hydrochlorothiazide (MICROZIDE) 12.5 mg, Oral, Daily   L-Methylfolate-Algae (DEPLIN 7.5) 7.5-90.314 MG CAPS 1 tablet, Oral, Daily   Multiple Vitamin (MULTIVITAMIN) tablet 1 tablet, Oral, Daily   nortriptyline (PAMELOR) 75 mg, Oral, Daily   omeprazole (PRILOSEC) 40 mg, Oral, Daily   ondansetron (ZOFRAN) 8 mg, Oral, Every 8 hours PRN   Probiotic Product (PROBIOTIC-10) CHEW 1 tablet, Oral, Daily   progesterone (PROMETRIUM) 100 mg, Oral, Daily   rosuvastatin (CRESTOR) 10 mg, Oral, Daily       Objective:   Physical Exam BP 130/78 (BP Location: Right Arm, Patient Position: Sitting, Cuff Size: Normal)   Pulse 81   Temp (!) 97.5 F (36.4 C) (Oral)   Resp 16   Ht '5\' 4"'$  (1.626 m)   Wt 194 lb 12.8 oz (88.4 kg)   SpO2 95%   BMI 33.44 kg/m  General:   Well developed, NAD, BMI noted. HEENT:  Normocephalic . Face symmetric, atraumatic Lungs:  CTA B Normal respiratory effort, no intercostal retractions,  no accessory muscle use. Heart: RRR,  no murmur.  Lower extremities: no pretibial edema bilaterally  Skin: Not pale. Not jaundice Neurologic:  alert & oriented X3.  Speech normal, gait appropriate for age and unassisted Psych--  Cognition and judgment appear intact.  Cooperative with normal attention span and concentration.  Behavior appropriate. No anxious or depressed appearing.      Assessment     Assessment  Prediabetes HTN Hyperlipidemia: lipitor aches (2018) Anxiety, depression (chronic sx, occ suicidal ideas, h/o self cutting). Used to see Dr Casimiro Needle GERD Migraines  Obesity, lap band surgery 2008, adjustment 2016 OSA: not using CPAP as off 06/2020 Acne (doxy) ++FH CAD: F, M , B MI age 69, G-parents  Osteopenia: T score -2.3   09/2020, Rx Fosamax SBO 09-2017  PLAN: ROV Prediabetes: Last  A1c increased to 6.4, currently on Jardiance.  Check A1c, further advised with results, consider restart metformin, see LOV High cholesterol: Last LDL 71, well-controlled on crestor. HTN: Currently on HCTZ, ran out recently, RF sent 90 days and 30 days. Morbid obesity: Restarted a healthier diet yesterday,  following plan. Preventive care: Had a colonoscopy 08-2022, UTD on COVID and flu shots RTC 4 months

## 2022-10-23 NOTE — Patient Instructions (Signed)
Check the  blood pressure regularly BP GOAL is between 110/65 and  135/85. If it is consistently higher or lower, let me know    GO TO THE LAB : Get the blood work     [place], [place] back for  a check up in 3-4  months

## 2022-11-15 ENCOUNTER — Other Ambulatory Visit: Payer: Self-pay | Admitting: Internal Medicine

## 2022-11-15 DIAGNOSIS — I10 Essential (primary) hypertension: Secondary | ICD-10-CM

## 2022-11-28 ENCOUNTER — Ambulatory Visit: Payer: 59 | Admitting: Internal Medicine

## 2022-12-13 ENCOUNTER — Other Ambulatory Visit: Payer: Self-pay | Admitting: Internal Medicine

## 2022-12-20 ENCOUNTER — Encounter: Payer: Self-pay | Admitting: Internal Medicine

## 2022-12-20 DIAGNOSIS — Z8249 Family history of ischemic heart disease and other diseases of the circulatory system: Secondary | ICD-10-CM

## 2022-12-20 NOTE — Addendum Note (Signed)
Addended byDamita Dunnings D on: 12/20/2022 10:48 AM   Modules accepted: Orders

## 2022-12-20 NOTE — Telephone Encounter (Signed)
Arrange a cardiology referral, + FH, multiple cardiovascular risk factors. She likes to see Dr Buford Dresser

## 2022-12-20 NOTE — Telephone Encounter (Signed)
Referral placed.

## 2023-02-05 ENCOUNTER — Ambulatory Visit (HOSPITAL_BASED_OUTPATIENT_CLINIC_OR_DEPARTMENT_OTHER): Payer: 59 | Admitting: Cardiology

## 2023-02-05 ENCOUNTER — Encounter (HOSPITAL_BASED_OUTPATIENT_CLINIC_OR_DEPARTMENT_OTHER): Payer: Self-pay | Admitting: Cardiology

## 2023-02-05 VITALS — BP 118/64 | HR 86 | Ht 64.0 in | Wt 195.0 lb

## 2023-02-05 DIAGNOSIS — E6609 Other obesity due to excess calories: Secondary | ICD-10-CM

## 2023-02-05 DIAGNOSIS — G4733 Obstructive sleep apnea (adult) (pediatric): Secondary | ICD-10-CM

## 2023-02-05 DIAGNOSIS — I10 Essential (primary) hypertension: Secondary | ICD-10-CM | POA: Diagnosis not present

## 2023-02-05 DIAGNOSIS — E7800 Pure hypercholesterolemia, unspecified: Secondary | ICD-10-CM

## 2023-02-05 DIAGNOSIS — Z8249 Family history of ischemic heart disease and other diseases of the circulatory system: Secondary | ICD-10-CM | POA: Diagnosis not present

## 2023-02-05 DIAGNOSIS — Z6833 Body mass index (BMI) 33.0-33.9, adult: Secondary | ICD-10-CM

## 2023-02-05 DIAGNOSIS — Z7189 Other specified counseling: Secondary | ICD-10-CM

## 2023-02-05 NOTE — Progress Notes (Signed)
Cardiology Office Note:    Date:  02/05/2023   ID:  [name], DOB [birth year], MRN [id number]  PCP:  Wanda Plump, MD  Cardiologist:  Jodelle Red, MD  Referring MD: Wanda Plump, MD   CC: new patient consultation for CV risk/family history of heart disease  History of Present Illness:    Rebekah [name] is a 62 y.o. female with a hx of hypertension, hyperlipidemia, Obesity, Pre-diabetes, and sleep apnea without CPAP who is seen as a new consult at the request of Wanda Plump, MD for the evaluation and management of family history of CAD.  Today, the patient states that she has been doing poorly since her brother passed away from MI in June 08, 2023 and has been worried about her heart since. She has had hypertension for 20 years. She generally doesn't check her blood pressure at home. She is Pre-Diabetic. Her highest adult weight is 236lbs in 2008 before her gastric banding, her lowest adult weight is 166lbs. She denies smoking. She is very active at work 6 hours a day. She generally doesn't eat lunch however her diet is very variable. She states that her issue is that she craves sweets all day every day. Her most strenuous activity was pulling weeds from the garden. She has gotten weaker in the last 6 months since starting HCTZ and has pains in the back of her legs when getting up.  As for family history, her mother had bypass surgery, her father had CAD and bypass. Both of her maternal grandparents had afib. Her paternal grandmother had dementia. Her father and paternal grandmother died of pulmonary fibrosis.  She denies any palpitations, chest pain, shortness of breath, or peripheral edema. No lightheadedness, headaches, syncope, orthopnea, or PND.  Cardiovascular risk factors: Prior clinical ASCVD:  none Comorbid conditions: hypertension for 20 years, hyperlipidemia for about 20 years--did well on lisinopril for a long time, HCTZ not as good at controlling it (doesn't routinely check  at home) No diabetes but prediabetic, no chronic kidney disease  Metabolic syndrome/Obesity: 236 lbs peak prior to lap band, lowest 166 lbs after lab band. Chronic inflammatory conditions: no Tobacco use history: none Family history: brother died last year, age 45, from MI. Had MI prior to that as well. Mother had CABG, father had CABG. Both maternal grandparents had heart disease. Paternal grandfather with heart disease. Prior pertinent testing and/or incidental findings: stress test and echo with Dr. Deborah Chalk, reported as normal Current diet: skips lunch, hungry at dinner and eats a variety of foods, not always healthy. Loves vegetables, husband is a vegetarian Exercise level: no intentional exercise, does stay active at work with lifting/walking. Has a small farm, always active with chores  Past Medical History:  Diagnosis Date   Allergy    Arthritis    Back pain    Bowel obstruction (HCC) 2019   Depression    Gall bladder stones    GERD (gastroesophageal reflux disease)    Hyperlipidemia    Hypertension    IBS (irritable bowel syndrome)    IBS (irritable bowel syndrome)    Joint pain    Obesity    Lap band surgery 12-08 Dr Daphine Deutscher   OSA (obstructive sleep apnea)    using a CPAP     Pre-diabetes    REDUCTION MAMMOPLASTY, HX OF 09/22/2007   Qualifier: Diagnosis of  By: Drue Novel MD, Nolon Rod.    Sleep apnea    doesn't use CPAP    Past Surgical History:  Procedure Laterality Date   APPENDECTOMY     BREAST REDUCTION SURGERY     CHOLECYSTECTOMY     CLOSED REDUCTION FINGER WITH PERCUTANEOUS PINNING Left 09/14/2014   Procedure: CLOSED REDUCTION PERCUTANEOUS PINNING ;  Surgeon: Betha Loa, MD;  [facility];  Service: Orthopedics;  Laterality: Left;   COLONOSCOPY     ESOPHAGEAL MANOMETRY N/A 09/23/2017   Procedure: ESOPHAGEAL MANOMETRY (EM);  Surgeon: Napoleon Form, MD;  Location: [facility];  Service: Endoscopy;  Laterality: N/A;   Lap Band Adjustment   09/01/2015   Dr. Andrey Campanile   LAPAROSCOPIC GASTRIC BANDING  09/22/2007   Lap band surgery 12-08 Dr Daphine Deutscher   NASAL SINUS SURGERY     TONSILLECTOMY     x2   UPPER GASTROINTESTINAL ENDOSCOPY      Current Medications: Current Outpatient Medications on File Prior to Visit  Medication Sig   nortriptyline (PAMELOR) 75 MG capsule TAKE 1 CAPSULE BY MOUTH DAILY   alendronate (FOSAMAX) 70 MG tablet Take 1 tablet (70 mg total) by mouth every 7 (seven) days. Take with a full glass of water on an empty stomach.   aspirin EC 81 MG tablet Take 81 mg by mouth daily. Swallow whole.   ELDERBERRY PO    empagliflozin (JARDIANCE) 10 MG TABS tablet Take 1 tablet (10 mg total) by mouth daily before breakfast.   Estradiol (DIVIGEL) 1 MG/GM GEL Place 1 mg onto the skin daily.   hydrochlorothiazide (MICROZIDE) 12.5 MG capsule Take 1 capsule (12.5 mg total) by mouth daily.   L-Methylfolate-Algae (DEPLIN 7.5) 7.5-90.314 MG CAPS TAKE 1 CAPSULE BY MOUTH EVERY DAY   Multiple Vitamin (MULTIVITAMIN) tablet Take 1 tablet by mouth daily.   omeprazole (PRILOSEC) 40 MG capsule Take 1 capsule (40 mg total) by mouth daily.   ondansetron (ZOFRAN) 8 MG tablet Take 1 tablet (8 mg total) by mouth every 8 (eight) hours as needed for nausea or vomiting.   Probiotic Product (PROBIOTIC-10) CHEW Chew 1 tablet by mouth daily.    progesterone (PROMETRIUM) 100 MG capsule Take 100 mg by mouth daily.   rosuvastatin (CRESTOR) 10 MG tablet Take 1 tablet (10 mg total) by mouth daily.   No current facility-administered medications on file prior to visit.     Allergies:   Penicillins, Metformin and related, and Erythromycin   Social History   Tobacco Use   Smoking status: Never   Smokeless tobacco: Never  Vaping Use   Vaping Use: Never used  Substance Use Topics   Alcohol use: Yes    Comment: socially    Drug use: No    Family History: family history includes Breast cancer in her mother; Coronary artery disease in her father;  Depression in her father and mother; Diverticulitis in her mother; Heart attack in her brother; Heart disease in her mother; High Cholesterol in her father and mother; High blood pressure in her father and mother; Obesity in her father and mother; Prostate cancer in her father; Pulmonary fibrosis in her father; Thyroid disease in her mother. There is no history of Diabetes, Colon cancer, Stomach cancer, Esophageal cancer, or Rectal cancer.  ROS:   Please see the history of present illness.  (+) Weakness (+) Leg Pain  Additional pertinent ROS: Constitutional: Negative for chills, fever, night sweats, unintentional weight loss  HENT: Negative for ear pain and hearing loss.   Eyes: Negative for loss of vision and eye pain.  Respiratory: Negative for cough, sputum, wheezing.   Cardiovascular: See HPI.  Gastrointestinal: Negative for abdominal pain, melena, and hematochezia.  Genitourinary: Negative for dysuria and hematuria.  Musculoskeletal: Negative for falls and myalgias.  Skin: Negative for itching and rash.  Neurological: Negative for focal weakness, focal sensory changes and loss of consciousness.  Endo/Heme/Allergies: Does not bruise/bleed easily.     EKGs/Labs/Other Studies Reviewed:    The following studies were reviewed today: No prior studies available  EKG:  EKG is personally reviewed.   02/05/2023: NSR at 86 bpm with nonspecific t wave pattern  Recent Labs: 07/27/2022: ALT 26; BUN 13; Creatinine, Ser 0.96; Potassium 4.5; Sodium 139; TSH 2.83  Recent Lipid Panel    Component Value Date/Time   CHOL 147 07/27/2022 0913   CHOL 149 10/25/2021 0843   TRIG 93.0 07/27/2022 0913   TRIG 99 08/30/2006 0904   HDL 57.60 07/27/2022 0913   HDL 48 10/25/2021 0843   CHOLHDL 3 07/27/2022 0913   VLDL 18.6 07/27/2022 0913   LDLCALC 71 07/27/2022 0913   LDLCALC 85 10/25/2021 0843    Physical Exam:    VS:  BP 118/64 (BP Location: Right Arm, Patient Position: Sitting, Cuff Size: Large)    Pulse 86   Ht 5\' 4"  (1.626 m)   Wt 195 lb (88.5 kg)   BMI 33.47 kg/m     Wt Readings from Last 3 Encounters:  02/25/23 195 lb 6 oz (88.6 kg)  02/05/23 195 lb (88.5 kg)  10/23/22 194 lb 12.8 oz (88.4 kg)    GEN: Well nourished, well developed in no acute distress HEENT: Normal, moist mucous membranes NECK: No JVD CARDIAC: regular rhythm, normal S1 and S2, no rubs or gallops. No murmur. VASCULAR: Radial and DP pulses 2+ bilaterally. No carotid bruits RESPIRATORY:  Clear to auscultation without rales, wheezing or rhonchi  ABDOMEN: Soft, non-tender, non-distended MUSCULOSKELETAL:  Ambulates independently SKIN: Warm and dry, no edema NEUROLOGIC:  Alert and oriented x 3. No focal neuro deficits noted. PSYCHIATRIC:  Normal affect    ASSESSMENT:    1. Class 1 obesity due to excess calories without serious comorbidity with body mass index (BMI) of 33.0 to 33.9 in adult   2. Family history of heart disease   3. Essential hypertension   4. Pure hypercholesterolemia   5. Cardiac risk counseling   6. Counseling on health promotion and disease prevention   7. OSA (obstructive sleep apnea)    PLAN:    Family history of heart disease Hyperlipidemia Obesity s/p lap band OSA Hypertension -risk factors for CAD -check lp(a) -interested in GLP1RA if covered -BP at goal, discussed home monitoring -continue aspirin, rosuvastatin -on both HCTZ and Jardiance, Would be cautious re: dehydration  Cardiac risk counseling and prevention recommendations: -recommend heart healthy/Mediterranean diet, with whole grains, fruits, vegetable, fish, lean meats, nuts, and olive oil. Limit salt. -recommend moderate walking, 3-5 times/week for 30-50 minutes each session. Aim for at least 150 minutes.week. Goal should be pace of 3 miles/hours, or walking 1.5 miles in 30 minutes -recommend avoidance of tobacco products. Avoid excess alcohol. -ASCVD risk score: The 10-year ASCVD risk score (Arnett DK, et  al., 2019) is: 6.6%   Values used to calculate the score:     Age: 76 years     Sex: Female     Is Non-Hispanic African American: No     Diabetic: Yes     Tobacco smoker: No     Systolic Blood Pressure: 130 mmHg     Is BP treated: Yes     HDL Cholesterol:  57.6 mg/dL     Total Cholesterol: 147 mg/dL    Plan for follow up: 1 year.  Jodelle Red, MD, PhD, [facility]  [facility]    Heart & Vascular at [facility] at [facility] [address], [address] [place], [place] [address] [phone]   Medication Adjustments/Labs and Tests Ordered: Current medicines are reviewed at length with the patient today.  Concerns regarding medicines are outlined above.  Orders Placed This Encounter  Procedures   Lipoprotein A (LPA)   EKG 12-Lead   No orders of the defined types were placed in this encounter.   Patient Instructions  Medication Instructions:  Your physician recommends that you continue on your current medications as directed. Please refer to the Current Medication list given to you today.   Labwork: LPa today   Testing/Procedures: NONE   Follow-Up: Your physician wants you to follow-up in: 12 months with Dr Barron Alvine will receive a reminder letter in the mail two months in advance. If you don't receive a letter, please call our office to schedule the follow-up appointment.  Any Other Special Instructions Will Be Listed Below (If Applicable).  how to check blood pressure:  -sit comfortably in a chair, feet uncrossed and flat on floor, for 5-10 minutes  -arm ideally should rest at the level of the heart. However, arm should be relaxed and not tense (for example, do not hold the arm up unsupported)  -avoid exercise, caffeine, and tobacco for at least 30 minutes prior to BP reading  -don't take BP cuff reading over clothes (always place on skin directly)  -I prefer to know how well the medication is working, so I  would like you to take your readings 1-2 hours after taking your blood pressure medication if possible    I,Coren O'Brien,acting as a scribe for [employer], MD.,have documented all relevant documentation on the behalf of Jodelle Red, MD,as directed by  Jodelle Red, MD while in the presence of Jodelle Red, MD.  I, Jodelle Red, MD, have reviewed all documentation for this visit. The documentation on 03/26/23 for the exam, diagnosis, procedures, and orders are all accurate and complete.

## 2023-02-05 NOTE — Patient Instructions (Signed)
Medication Instructions:  Your physician recommends that you continue on your current medications as directed. Please refer to the Current Medication list given to you today.   Labwork: LPa today   Testing/Procedures: NONE   Follow-Up: Your physician wants you to follow-up in: 12 months with Dr Barron Alvine will receive a reminder letter in the mail two months in advance. If you don't receive a letter, please call our office to schedule the follow-up appointment.  Any Other Special Instructions Will Be Listed Below (If Applicable).  how to check blood pressure:  -sit comfortably in a chair, feet uncrossed and flat on floor, for 5-10 minutes  -arm ideally should rest at the level of the heart. However, arm should be relaxed and not tense (for example, do not hold the arm up unsupported)  -avoid exercise, caffeine, and tobacco for at least 30 minutes prior to BP reading  -don't take BP cuff reading over clothes (always place on skin directly)  -I prefer to know how well the medication is working, so I would like you to take your readings 1-2 hours after taking your blood pressure medication if possible

## 2023-02-06 LAB — LIPOPROTEIN A (LPA): Lipoprotein (a): <8.4 nmol/L (ref <75.0)

## 2023-02-25 ENCOUNTER — Ambulatory Visit: Payer: 59 | Admitting: Internal Medicine

## 2023-02-25 ENCOUNTER — Encounter: Payer: Self-pay | Admitting: Internal Medicine

## 2023-02-25 VITALS — BP 126/82 | HR 80 | Temp 98.1°F | Resp 16 | Ht 64.0 in | Wt 195.4 lb

## 2023-02-25 DIAGNOSIS — R7303 Prediabetes: Secondary | ICD-10-CM

## 2023-02-25 DIAGNOSIS — M7918 Myalgia, other site: Secondary | ICD-10-CM | POA: Diagnosis not present

## 2023-02-25 DIAGNOSIS — I10 Essential (primary) hypertension: Secondary | ICD-10-CM

## 2023-02-25 LAB — CBC WITH DIFFERENTIAL/PLATELET
Basophils Absolute: 0.1 10*3/uL (ref 0.0–0.1)
Basophils Relative: 0.7 % (ref 0.0–3.0)
Eosinophils Absolute: 0.9 10*3/uL — ABNORMAL HIGH (ref 0.0–0.7)
Eosinophils Relative: 7.8 % — ABNORMAL HIGH (ref 0.0–5.0)
HCT: 42.4 % (ref 36.0–46.0)
Hemoglobin: 13.8 g/dL (ref 12.0–15.0)
Lymphocytes Relative: 26.5 % (ref 12.0–46.0)
Lymphs Abs: 3.1 10*3/uL (ref 0.7–4.0)
MCHC: 32.6 g/dL (ref 30.0–36.0)
MCV: 82.4 fl (ref 78.0–100.0)
Monocytes Absolute: 0.6 10*3/uL (ref 0.1–1.0)
Monocytes Relative: 5.3 % (ref 3.0–12.0)
Neutro Abs: 6.9 10*3/uL (ref 1.4–7.7)
Neutrophils Relative %: 59.7 % (ref 43.0–77.0)
Platelets: 313 10*3/uL (ref 150.0–400.0)
RBC: 5.15 Mil/uL — ABNORMAL HIGH (ref 3.87–5.11)
RDW: 18.5 % — ABNORMAL HIGH (ref 11.5–15.5)
WBC: 11.6 10*3/uL — ABNORMAL HIGH (ref 4.0–10.5)

## 2023-02-25 LAB — BASIC METABOLIC PANEL
BUN: 13 mg/dL (ref 6–23)
CO2: 28 mEq/L (ref 19–32)
Calcium: 9.1 mg/dL (ref 8.4–10.5)
Chloride: 101 mEq/L (ref 96–112)
Creatinine, Ser: 0.86 mg/dL (ref 0.40–1.20)
GFR: 73.37 mL/min (ref >60.00)
Glucose, Bld: 81 mg/dL (ref 70–99)
Potassium: 4.1 mEq/L (ref 3.5–5.1)
Sodium: 138 mEq/L (ref 135–145)

## 2023-02-25 LAB — HEMOGLOBIN A1C: Hgb A1c MFr Bld: 6.4 % (ref 4.6–6.5)

## 2023-02-25 NOTE — Assessment & Plan Note (Signed)
Here for routine checkup Prediabetes: On Jardiance, lifestyle is healthy, check A1c. HTN: On hydrochlorothiazide.  BP looks good.  Check CBC, BMP Pain, buttocks: As described above, no evidence of vascular disease or radiculopathy, stretching helps, recommend formal PT.  Call if not better RTC 5 months CPX

## 2023-02-25 NOTE — Progress Notes (Signed)
Subjective:    Patient ID: Rebekah [name], female    DOB: 11-10-1960, 62 y.o.   MRN: [id number]  DOS:  02/25/2023 Type of visit - description: F/U  Saw cardiology last month Chronic medical problems were reviewed. She reports that for the last 3 months she has occasional spasms of both buttocks. Could be at rest or with walking. Sxs  last few minutes. No back pain per se.  No lower extremity paresthesias. Stretching helps to some extent.  Wt Readings from Last 3 Encounters:  02/25/23 195 lb 6 oz (88.6 kg)  02/05/23 195 lb (88.5 kg)  10/23/22 194 lb 12.8 oz (88.4 kg)     Review of Systems See above   Past Medical History:  Diagnosis Date   Allergy    Arthritis    Back pain    Bowel obstruction (HCC) 2019   Depression    Gall bladder stones    GERD (gastroesophageal reflux disease)    Hyperlipidemia    Hypertension    IBS (irritable bowel syndrome)    IBS (irritable bowel syndrome)    Joint pain    Obesity    Lap band surgery 12-08 Dr Daphine Deutscher   OSA (obstructive sleep apnea)    using a CPAP     Pre-diabetes    REDUCTION MAMMOPLASTY, HX OF 09/22/2007   Qualifier: Diagnosis of  By: Drue Novel MD, Nolon Rod.    Sleep apnea    doesn't use CPAP    Past Surgical History:  Procedure Laterality Date   APPENDECTOMY     BREAST REDUCTION SURGERY     CHOLECYSTECTOMY     CLOSED REDUCTION FINGER WITH PERCUTANEOUS PINNING Left 09/14/2014   Procedure: CLOSED REDUCTION PERCUTANEOUS PINNING ;  Surgeon: Betha Loa, MD;  Location: [facility];  Service: Orthopedics;  Laterality: Left;   COLONOSCOPY     ESOPHAGEAL MANOMETRY N/A 09/23/2017   Procedure: ESOPHAGEAL MANOMETRY (EM);  Surgeon: Napoleon Form, MD;  Location: [facility];  Service: Endoscopy;  Laterality: N/A;   Lap Band Adjustment  09/01/2015   Dr. Andrey Campanile   LAPAROSCOPIC GASTRIC BANDING  09/22/2007   Lap band surgery 12-08 Dr Daphine Deutscher   NASAL SINUS SURGERY     TONSILLECTOMY     x2   UPPER GASTROINTESTINAL  ENDOSCOPY      Current Outpatient Medications  Medication Instructions   alendronate (FOSAMAX) 70 mg, Oral, Every 7 days, Take with a full glass of water on an empty stomach.   aspirin EC 81 mg, Oral, Daily, Swallow whole.   Divigel 1 mg, Transdermal, Daily   ELDERBERRY PO No dose, route, or frequency recorded.   empagliflozin (JARDIANCE) 10 mg, Oral, Daily before breakfast   hydrochlorothiazide (MICROZIDE) 12.5 mg, Oral, Daily   L-Methylfolate-Algae (DEPLIN 7.5) 7.5-90.314 MG CAPS 1 tablet, Oral, Daily   Multiple Vitamin (MULTIVITAMIN) tablet 1 tablet, Oral, Daily   nortriptyline (PAMELOR) 75 mg, Oral, Daily   omeprazole (PRILOSEC) 40 mg, Oral, Daily   ondansetron (ZOFRAN) 8 mg, Oral, Every 8 hours PRN   Probiotic Product (PROBIOTIC-10) CHEW 1 tablet, Oral, Daily   progesterone (PROMETRIUM) 100 mg, Oral, Daily   rosuvastatin (CRESTOR) 10 mg, Oral, Daily       Objective:   Physical Exam BP 126/82   Pulse 80   Temp 98.1 F (36.7 C) (Oral)   Resp 16   Ht 5\' 4"  (1.626 m)   Wt 195 lb 6 oz (88.6 kg)   SpO2 97%   BMI 33.54 kg/m  General:   Well developed, NAD, BMI noted. HEENT:  Normocephalic . Face symmetric, atraumatic Lungs:  CTA B Normal respiratory effort, no intercostal retractions, no accessory muscle use. Heart: RRR,  no murmur.  Lower extremities: no pretibial edema bilaterally.  No edema, good pedal pulses Skin: Not pale. Not jaundice Neurologic:  alert & oriented X3.  Speech normal, gait appropriate for age and unassisted. Motor and DTR symmetric.  Straight leg test negative Psych--  Cognition and judgment appear intact.  Cooperative with normal attention span and concentration.  Behavior appropriate. No anxious or depressed appearing.      Assessment    Assessment  Prediabetes HTN Hyperlipidemia: lipitor aches (2018) Anxiety, depression (chronic sx, occ suicidal ideas, h/o self cutting). Used to see Dr Donell Beers GERD Migraines  Obesity, lap band  surgery 2008, adjustment 2016 OSA: not using CPAP as off 06/2020 Acne (doxy) ++FH CAD: F, M , B MI age 55, G-parents  Osteopenia: T score -2.3   09/2020, Rx Fosamax SBO 09-2017  PLAN: Here for routine checkup Prediabetes: On Jardiance, lifestyle is healthy, check A1c. HTN: On hydrochlorothiazide.  BP looks good.  Check CBC, BMP Pain, buttocks: As described above, no evidence of vascular disease or radiculopathy, stretching helps, recommend formal PT.  Call if not better RTC 5 months CPX

## 2023-02-25 NOTE — Patient Instructions (Addendum)
Note reviewed.

## 2023-02-26 ENCOUNTER — Encounter: Payer: Self-pay | Admitting: Internal Medicine

## 2023-02-28 ENCOUNTER — Other Ambulatory Visit: Payer: Self-pay | Admitting: Internal Medicine

## 2023-03-01 NOTE — Therapy (Unsigned)
[facility] OUTPATIENT PHYSICAL THERAPY LOWER EXTREMITY EVALUATION   Patient Name: Rebekah [name] MRN: [id number] DOB:[birth year], 62 y.o., female Today's Date: 03/04/2023  END OF SESSION:  PT End of Session - 03/04/23 1231     Visit Number 1    Date for PT Re-Evaluation 05/13/23    PT Start Time 1230    PT Stop Time 1300    PT Time Calculation (min) 30 min             Past Medical History:  Diagnosis Date   Allergy    Arthritis    Back pain    Bowel obstruction (HCC) 2019   Depression    Gall bladder stones    GERD (gastroesophageal reflux disease)    Hyperlipidemia    Hypertension    IBS (irritable bowel syndrome)    IBS (irritable bowel syndrome)    Joint pain    Obesity    Lap band surgery 12-08 Dr Daphine Deutscher   OSA (obstructive sleep apnea)    using a CPAP     Pre-diabetes    REDUCTION MAMMOPLASTY, HX OF 09/22/2007   Qualifier: Diagnosis of  By: Drue Novel MD, Nolon Rod    Sleep apnea    doesn't use CPAP   Past Surgical History:  Procedure Laterality Date   APPENDECTOMY     BREAST REDUCTION SURGERY     CHOLECYSTECTOMY     CLOSED REDUCTION FINGER WITH PERCUTANEOUS PINNING Left 09/14/2014   Procedure: CLOSED REDUCTION PERCUTANEOUS PINNING ;  Surgeon: Betha Loa, MD;  Location: [facility];  Service: Orthopedics;  Laterality: Left;   COLONOSCOPY     ESOPHAGEAL MANOMETRY N/A 09/23/2017   Procedure: ESOPHAGEAL MANOMETRY (EM);  Surgeon: Napoleon Form, MD;  Location: [facility];  Service: Endoscopy;  Laterality: N/A;   Lap Band Adjustment  09/01/2015   Dr. Andrey Campanile   LAPAROSCOPIC GASTRIC BANDING  09/22/2007   Lap band surgery 12-08 Dr Daphine Deutscher   NASAL SINUS SURGERY     TONSILLECTOMY     x2   UPPER GASTROINTESTINAL ENDOSCOPY     Patient Active Problem List   Diagnosis Date Noted   Atypical glandular cells on cervical Pap smear 07/26/2022   Osteopenia 01/29/2022   HGSIL (high grade squamous intraepithelial lesion) on Pap smear of cervix 07/12/2021    Metabolic syndrome 01/10/2020   Morbid obesity (HCC) 01/10/2020   Migraines 11/13/2019   Hyperglycemia, unspecified 07/21/2018   SBO (small bowel obstruction) (HCC) 09/28/2017   Dysphagia    PCP NOTES >>>> 09/25/2015   Annual physical exam 09/05/2011   GERD 10/12/2009   Hyperlipidemia 09/22/2007   Anxiety and depression 09/22/2007   Essential hypertension 09/22/2007   Seasonal and perennial allergic rhinitis 09/22/2007   Obstructive sleep apnea 09/22/2007    PCP: Wanda Plump, MD  REFERRING PROVIDER: Wanda Plump, MD  REFERRING DIAG:  Diagnosis  M79.18 (ICD-10-CM) - Buttock pain    THERAPY DIAG:  Abnormal posture  Muscle weakness (generalized)  Bilateral buttock pain  Pain of right heel  Rationale for Evaluation and Treatment: Rehabilitation  ONSET DATE: 02/25/23  SUBJECTIVE:   SUBJECTIVE STATEMENT: Patient reports 1 month H/O B buttock pain that sometimes extends into legs, R > L, but both are painful. R heel pain as well. Bedroom is on the main floor, but her cats are upstairs so she must negotiate them frequently.  PERTINENT HISTORY: Prediabetes HTN Hyperlipidemia: lipitor aches (2018) Anxiety, depression (chronic sx, occ suicidal ideas, h/o self cutting). Used to see  Dr Donell Beers GERD Migraines  Obesity, lap band surgery 2008, adjustment 2016 OSA: not using CPAP as off 06/2020 Acne (doxy) ++FH CAD: F, M , B MI age 73, G-parents  Osteopenia: T score -2.3   09/2020, Rx Fosamax SBO 09-2017 PAIN:  Are you having pain? Yes: NPRS scale: 9/10 Pain location: B hips into legs Pain description: spasm like Aggravating factors: sitting, walking, first thing in the morning. Relieving factors: Stretching sometimes helps, movement  PRECAUTIONS: None  WEIGHT BEARING RESTRICTIONS: No  FALLS:  Has patient fallen in last 6 months? No  LIVING ENVIRONMENT: Lives with: lives with their family and lives with their spouse Lives in: House/apartment Stairs: Yes: Internal:  12 steps; on right going up and External: 2 steps; none, severely painful Has following equipment at home: None  OCCUPATION: [employer], on her feet, part time. She also likes to garden.  PLOF: Independent  PATIENT GOALS: Decrease [pain, cope with the pain better to be able to return to her normal activities.  NEXT MD VISIT: unknown  OBJECTIVE:   DIAGNOSTIC FINDINGS: N/A  COGNITION: Overall cognitive status: Within functional limits for tasks assessed     SENSATION: WFL  EDEMA:  Patient denies swelling.  MUSCLE LENGTH: Hamstrings: Right 80 deg; Left 80 deg Thomas test: WNL B  POSTURE: rounded shoulders, decreased lumbar lordosis, decreased thoracic kyphosis, and dowager's hump  PALPATION: Mildly TTP at B HS origin, tension noted throughout BLE, although she is flexible. R heel is TTP at insertion of PF.  LOWER EXTREMITY ROM: WNL B   LOWER EXTREMITY MMT: 4/5 B hip ext, otherwise 5/5. Trunk instability noted.   LOWER EXTREMITY SPECIAL TESTS:  Back- SLR and slump test-both negative  GAIT: Distance walked: In clinic distances Assistive device utilized: None Level of assistance: Complete Independence Comments: No obvious gait deviations- patient reports that her pain is not currently present.   TODAY'S TREATMENT:                                                                                                                              DATE:  03/04/23 Education    PATIENT EDUCATION:  Education details: POC Person educated: Patient Education method: Explanation Education comprehension: verbalized understanding  HOME EXERCISE PROGRAM: TBD  ASSESSMENT:  CLINICAL IMPRESSION: Patient is a 62 y.o. who was seen today for physical therapy evaluation and treatment for B buttock pain into her legs, and R heel pain. The pain can rise to 9/10, but fluctuates a lot and was not really present during exam. When present, the pain I in the area of her HS origins. Worst  first thing in am and after a lot of standing. She also reports heel pain consistent with mild PF. She will benefit from PT to strengthen her hips and postural stability, treat any acute  pain as well as stretch and strengthen trunk and hips, and establish HEP for her to continue to address her issues after D/C.  OBJECTIVE IMPAIRMENTS: Abnormal gait, decreased activity tolerance, decreased balance, decreased coordination, difficulty walking, decreased ROM, decreased strength, obesity, and pain.   ACTIVITY LIMITATIONS: lifting, bending, sitting, squatting, sleeping, stairs, and locomotion level  PARTICIPATION LIMITATIONS: cleaning, laundry, shopping, and community activity  PERSONAL FACTORS: Past/current experiences are also affecting patient's functional outcome.   REHAB POTENTIAL: Good  CLINICAL DECISION MAKING: Stable/uncomplicated  EVALUATION COMPLEXITY: Low   GOALS: Goals reviewed with patient? Yes  SHORT TERM GOALS: Target date: 03/17/23 I with initial HEP Baseline: Goal status: INITIAL  LONG TERM GOALS: Target date: 05/13/23  I final HEP Baseline:  Goal status: INITIAL  2.  Increase B hip strength to 5/5 Baseline:  Goal status: INITIAL  3.  Patient will increase activity tolerance to get through her full day with pain < 3/10 Baseline: 9/10 Goal status: INITIAL  4.  Patient will report resolution of her R heel pain with exercises and obtaining shoes offering arch support Baseline: Fluctuating R heel pain, worst in the morning. Goal status: INITIAL  PLAN:  PT FREQUENCY: 1-2x/week  PT DURATION: 10 weeks  PLANNED INTERVENTIONS: Therapeutic exercises, Therapeutic activity, Neuromuscular re-education, Balance training, Gait training, Patient/Family education, Self Care, Joint mobilization, Stair training, Dry Needling, Cryotherapy, Moist heat, Ionotophoresis 4mg /ml Dexamethasone, and Manual therapy  PLAN FOR NEXT SESSION: HEP-foot intrinsics, hip strength,  stretch.   Iona Beard, DPT 03/04/2023, 1:21 PM

## 2023-03-04 ENCOUNTER — Encounter: Payer: Self-pay | Admitting: Physical Therapy

## 2023-03-04 ENCOUNTER — Ambulatory Visit: Payer: 59 | Attending: Internal Medicine | Admitting: Physical Therapy

## 2023-03-04 DIAGNOSIS — R293 Abnormal posture: Secondary | ICD-10-CM | POA: Diagnosis present

## 2023-03-04 DIAGNOSIS — M7918 Myalgia, other site: Secondary | ICD-10-CM

## 2023-03-04 DIAGNOSIS — M79671 Pain in right foot: Secondary | ICD-10-CM

## 2023-03-04 DIAGNOSIS — M6281 Muscle weakness (generalized): Secondary | ICD-10-CM | POA: Diagnosis present

## 2023-03-06 ENCOUNTER — Ambulatory Visit: Payer: 59 | Admitting: Physical Therapy

## 2023-03-06 ENCOUNTER — Encounter: Payer: Self-pay | Admitting: Physical Therapy

## 2023-03-06 DIAGNOSIS — R293 Abnormal posture: Secondary | ICD-10-CM

## 2023-03-06 DIAGNOSIS — M7918 Myalgia, other site: Secondary | ICD-10-CM

## 2023-03-06 DIAGNOSIS — M6281 Muscle weakness (generalized): Secondary | ICD-10-CM

## 2023-03-06 DIAGNOSIS — M79671 Pain in right foot: Secondary | ICD-10-CM

## 2023-03-06 NOTE — Therapy (Signed)
[facility] OUTPATIENT PHYSICAL THERAPY LOWER EXTREMITY EVALUATION   Patient Name: Rebekah [name] MRN: [id number] DOB:1961/07/01, 62 y.o., female Today's Date: 03/06/2023  END OF SESSION:  PT End of Session - 03/06/23 1726     Visit Number 2    Date for PT Re-Evaluation 05/13/23    PT Start Time 1721    PT Stop Time 1800    PT Time Calculation (min) 39 min    Activity Tolerance Patient tolerated treatment well    Behavior During Therapy [facility] for tasks assessed/performed             Past Medical History:  Diagnosis Date   Allergy    Arthritis    Back pain    Bowel obstruction (HCC) 2019   Depression    Gall bladder stones    GERD (gastroesophageal reflux disease)    Hyperlipidemia    Hypertension    IBS (irritable bowel syndrome)    IBS (irritable bowel syndrome)    Joint pain    Obesity    Lap band surgery 12-08 Dr Daphine Deutscher   OSA (obstructive sleep apnea)    using a CPAP     Pre-diabetes    REDUCTION MAMMOPLASTY, HX OF 09/22/2007   Qualifier: Diagnosis of  By: Drue Novel MD, Nolon Rod    Sleep apnea    doesn't use CPAP   Past Surgical History:  Procedure Laterality Date   APPENDECTOMY     BREAST REDUCTION SURGERY     CHOLECYSTECTOMY     CLOSED REDUCTION FINGER WITH PERCUTANEOUS PINNING Left 09/14/2014   Procedure: CLOSED REDUCTION PERCUTANEOUS PINNING ;  Surgeon: Betha Loa, MD;  Location: [facility];  Service: Orthopedics;  Laterality: Left;   COLONOSCOPY     ESOPHAGEAL MANOMETRY N/A 09/23/2017   Procedure: ESOPHAGEAL MANOMETRY (EM);  Surgeon: Napoleon Form, MD;  Location: [facility];  Service: Endoscopy;  Laterality: N/A;   Lap Band Adjustment  09/01/2015   Dr. Andrey Campanile   LAPAROSCOPIC GASTRIC BANDING  09/22/2007   Lap band surgery 12-08 Dr Daphine Deutscher   NASAL SINUS SURGERY     TONSILLECTOMY     x2   UPPER GASTROINTESTINAL ENDOSCOPY     Patient Active Problem List   Diagnosis Date Noted   Atypical glandular cells on cervical Pap smear 07/26/2022    Osteopenia 01/29/2022   HGSIL (high grade squamous intraepithelial lesion) on Pap smear of cervix 07/12/2021   Metabolic syndrome 01/10/2020   Morbid obesity (HCC) 01/10/2020   Migraines 11/13/2019   Hyperglycemia, unspecified 07/21/2018   SBO (small bowel obstruction) (HCC) 09/28/2017   Dysphagia    PCP NOTES >>>> 09/25/2015   Annual physical exam 09/05/2011   GERD 10/12/2009   Hyperlipidemia 09/22/2007   Anxiety and depression 09/22/2007   Essential hypertension 09/22/2007   Seasonal and perennial allergic rhinitis 09/22/2007   Obstructive sleep apnea 09/22/2007    PCP: Wanda Plump, MD  REFERRING PROVIDER: Wanda Plump, MD  REFERRING DIAG:  Diagnosis  M79.18 (ICD-10-CM) - Buttock pain    THERAPY DIAG:  Abnormal posture  Muscle weakness (generalized)  Bilateral buttock pain  Pain of right heel  Rationale for Evaluation and Treatment: Rehabilitation  ONSET DATE: 02/25/23  SUBJECTIVE:   SUBJECTIVE STATEMENT: Patient reports 1 month H/O B buttock pain that sometimes extends into legs, R > L, but both are painful. R heel pain as well. Bedroom is on the main floor, but her cats are upstairs so she must negotiate them frequently.  PERTINENT HISTORY:  Prediabetes HTN Hyperlipidemia: lipitor aches (2018) Anxiety, depression (chronic sx, occ suicidal ideas, h/o self cutting). Used to see Dr Donell Beers GERD Migraines  Obesity, lap band surgery 2008, adjustment 2016 OSA: not using CPAP as off 06/2020 Acne (doxy) ++FH CAD: F, M , B MI age 18, G-parents  Osteopenia: T score -2.3   09/2020, Rx Fosamax SBO 09-2017 PAIN:  Are you having pain? Yes: NPRS scale: 9/10 Pain location: B hips into legs Pain description: spasm like Aggravating factors: sitting, walking, first thing in the morning. Relieving factors: Stretching sometimes helps, movement  PRECAUTIONS: None  WEIGHT BEARING RESTRICTIONS: No  FALLS:  Has patient fallen in last 6 months? No  LIVING  ENVIRONMENT: Lives with: lives with their family and lives with their spouse Lives in: House/apartment Stairs: Yes: Internal: 12 steps; on right going up and External: 2 steps; none, severely painful Has following equipment at home: None  OCCUPATION: [employer], on her feet, part time. She also likes to garden.  PLOF: Independent  PATIENT GOALS: Decrease [pain, cope with the pain better to be able to return to her normal activities.  NEXT MD VISIT: unknown  OBJECTIVE:   DIAGNOSTIC FINDINGS: N/A  COGNITION: Overall cognitive status: Within functional limits for tasks assessed     SENSATION: WFL  EDEMA:  Patient denies swelling.  MUSCLE LENGTH: Hamstrings: Right 80 deg; Left 80 deg Thomas test: WNL B  POSTURE: rounded shoulders, decreased lumbar lordosis, decreased thoracic kyphosis, and dowager's hump  PALPATION: Mildly TTP at B HS origin, tension noted throughout BLE, although she is flexible. R heel is TTP at insertion of PF.  LOWER EXTREMITY ROM: WNL B   LOWER EXTREMITY MMT: 4/5 B hip ext, otherwise 5/5. Trunk instability noted.   LOWER EXTREMITY SPECIAL TESTS:  Back- SLR and slump test-both negative  GAIT: Distance walked: In clinic distances Assistive device utilized: None Level of assistance: Complete Independence Comments: No obvious gait deviations- patient reports that her pain is not currently present.   TODAY'S TREATMENT:                                                                                                                              DATE:  03/06/23 NuStep L5 x 6 minutes Supine Deep tissue self mobs with tennis ball for B piriformis Supine stretching, figure 4 with strap, glut stretch Supine strengthening, clamshells against G tband resistance, pelvic tilts with 5 sec hold, bridge with pelvic tilt, 10 each. Seated HS stretch Standing mini squats and hip abd against G tband resistance, 10 reps each  03/04/23 Education    PATIENT  EDUCATION:  Education details: POC Person educated: Patient Education method: Explanation Education comprehension: verbalized understanding  HOME EXERCISE PROGRAM: TBD  ASSESSMENT:  CLINICAL IMPRESSION: Patient reports that she used a tennis ball on her piriformis, demonstrated technique, which was effective for her. Progressed with the deep tissue mobs, then had her perform stretching and strengthening program for lower body.  Initiated HEP with same.She was able to return demosntrate all exercises.  OBJECTIVE IMPAIRMENTS: Abnormal gait, decreased activity tolerance, decreased balance, decreased coordination, difficulty walking, decreased ROM, decreased strength, obesity, and pain.   ACTIVITY LIMITATIONS: lifting, bending, sitting, squatting, sleeping, stairs, and locomotion level  PARTICIPATION LIMITATIONS: cleaning, laundry, shopping, and community activity  PERSONAL FACTORS: Past/current experiences are also affecting patient's functional outcome.   REHAB POTENTIAL: Good  CLINICAL DECISION MAKING: Stable/uncomplicated  EVALUATION COMPLEXITY: Low   GOALS: Goals reviewed with patient? Yes  SHORT TERM GOALS: Target date: 03/17/23 I with initial HEP Baseline: Goal status: Provided with initial HEP  LONG TERM GOALS: Target date: 05/13/23  I final HEP Baseline:  Goal status: INITIAL  2.  Increase B hip strength to 5/5 Baseline:  Goal status: INITIAL  3.  Patient will increase activity tolerance to get through her full day with pain < 3/10 Baseline: 9/10 Goal status: INITIAL  4.  Patient will report resolution of her R heel pain with exercises and obtaining shoes offering arch support Baseline: Fluctuating R heel pain, worst in the morning. Goal status: INITIAL  PLAN:  PT FREQUENCY: 1-2x/week  PT DURATION: 10 weeks  PLANNED INTERVENTIONS: Therapeutic exercises, Therapeutic activity, Neuromuscular re-education, Balance training, Gait training, Patient/Family  education, Self Care, Joint mobilization, Stair training, Dry Needling, Cryotherapy, Moist heat, Ionotophoresis 4mg /ml Dexamethasone, and Manual therapy  PLAN FOR NEXT SESSION: HEP-foot intrinsics, hip strength, stretch.   Iona Beard, DPT 03/06/2023, 6:02 PM

## 2023-03-11 ENCOUNTER — Ambulatory Visit: Payer: 59

## 2023-03-11 DIAGNOSIS — R293 Abnormal posture: Secondary | ICD-10-CM

## 2023-03-11 DIAGNOSIS — M6281 Muscle weakness (generalized): Secondary | ICD-10-CM

## 2023-03-11 DIAGNOSIS — M7918 Myalgia, other site: Secondary | ICD-10-CM

## 2023-03-11 DIAGNOSIS — M79671 Pain in right foot: Secondary | ICD-10-CM

## 2023-03-11 NOTE — Therapy (Signed)
[facility] OUTPATIENT PHYSICAL THERAPY LOWER EXTREMITY EVALUATION   Patient Name: Rebekah [name] MRN: [id number] DOB:1961/03/27, 62 y.o., female Today's Date: 03/11/2023  END OF SESSION:  PT End of Session - 03/11/23 1516     Visit Number 3    Date for PT Re-Evaluation 05/13/23    PT Start Time 1512    PT Stop Time 1558    PT Time Calculation (min) 46 min    Activity Tolerance Patient tolerated treatment well    Behavior During Therapy Rebekah [name] for tasks assessed/performed              Past Medical History:  Diagnosis Date   Allergy    Arthritis    Back pain    Bowel obstruction (HCC) 2019   Depression    Gall bladder stones    GERD (gastroesophageal reflux disease)    Hyperlipidemia    Hypertension    IBS (irritable bowel syndrome)    IBS (irritable bowel syndrome)    Joint pain    Obesity    Lap band surgery 12-08 Dr Daphine Deutscher   OSA (obstructive sleep apnea)    using a CPAP     Pre-diabetes    REDUCTION MAMMOPLASTY, HX OF 09/22/2007   Qualifier: Diagnosis of  By: Drue Novel MD, Nolon Rod    Sleep apnea    doesn't use CPAP   Past Surgical History:  Procedure Laterality Date   APPENDECTOMY     BREAST REDUCTION SURGERY     CHOLECYSTECTOMY     CLOSED REDUCTION FINGER WITH PERCUTANEOUS PINNING Left 09/14/2014   Procedure: CLOSED REDUCTION PERCUTANEOUS PINNING ;  Surgeon: Betha Loa, MD;  Location: [facility];  Service: Orthopedics;  Laterality: Left;   COLONOSCOPY     ESOPHAGEAL MANOMETRY N/A 09/23/2017   Procedure: ESOPHAGEAL MANOMETRY (EM);  Surgeon: Napoleon Form, MD;  Location: [facility];  Service: Endoscopy;  Laterality: N/A;   Lap Band Adjustment  09/01/2015   Dr. Andrey Campanile   LAPAROSCOPIC GASTRIC BANDING  09/22/2007   Lap band surgery 12-08 Dr Daphine Deutscher   NASAL SINUS SURGERY     TONSILLECTOMY     x2   UPPER GASTROINTESTINAL ENDOSCOPY     Patient Active Problem List   Diagnosis Date Noted   Atypical glandular cells on cervical Pap smear 07/26/2022    Osteopenia 01/29/2022   HGSIL (high grade squamous intraepithelial lesion) on Pap smear of cervix 07/12/2021   Metabolic syndrome 01/10/2020   Morbid obesity (HCC) 01/10/2020   Migraines 11/13/2019   Hyperglycemia, unspecified 07/21/2018   SBO (small bowel obstruction) (HCC) 09/28/2017   Dysphagia    PCP NOTES >>>> 09/25/2015   Annual physical exam 09/05/2011   GERD 10/12/2009   Hyperlipidemia 09/22/2007   Anxiety and depression 09/22/2007   Essential hypertension 09/22/2007   Seasonal and perennial allergic rhinitis 09/22/2007   Obstructive sleep apnea 09/22/2007    PCP: Wanda Plump, MD  REFERRING PROVIDER: Wanda Plump, MD  REFERRING DIAG:  Diagnosis  M79.18 (ICD-10-CM) - Buttock pain    THERAPY DIAG:  Abnormal posture  Muscle weakness (generalized)  Bilateral buttock pain  Pain of right heel  Rationale for Evaluation and Treatment: Rehabilitation  ONSET DATE: 02/25/23  SUBJECTIVE:   SUBJECTIVE STATEMENT: Patient reports 1 month H/O B buttock pain that sometimes extends into legs, R > L, but both are painful. R heel pain as well. Bedroom is on the main floor, but her cats are upstairs so she must negotiate them frequently.  PERTINENT  HISTORY: Prediabetes HTN Hyperlipidemia: lipitor aches (2018) Anxiety, depression (chronic sx, occ suicidal ideas, h/o self cutting). Used to see Dr Donell Beers GERD Migraines  Obesity, lap band surgery 2008, adjustment 2016 OSA: not using CPAP as off 06/2020 Acne (doxy) ++FH CAD: F, M , B MI age 58, G-parents  Osteopenia: T score -2.3   09/2020, Rx Fosamax SBO 09-2017 PAIN:  Are you having pain? Yes: NPRS scale: 9/10 Pain location: B hips into legs Pain description: spasm like Aggravating factors: sitting, walking, first thing in the morning. Relieving factors: Stretching sometimes helps, movement  PRECAUTIONS: None  WEIGHT BEARING RESTRICTIONS: No  FALLS:  Has patient fallen in last 6 months? No  LIVING  ENVIRONMENT: Lives with: lives with their family and lives with their spouse Lives in: House/apartment Stairs: Yes: Internal: 12 steps; on right going up and External: 2 steps; none, severely painful Has following equipment at home: None  OCCUPATION: [employer], on her feet, part time. She also likes to garden.  PLOF: Independent  PATIENT GOALS: Decrease [pain, cope with the pain better to be able to return to her normal activities.  NEXT MD VISIT: unknown  OBJECTIVE:   DIAGNOSTIC FINDINGS: N/A  COGNITION: Overall cognitive status: Within functional limits for tasks assessed     SENSATION: WFL  EDEMA:  Patient denies swelling.  MUSCLE LENGTH: Hamstrings: Right 80 deg; Left 80 deg Thomas test: WNL B  POSTURE: rounded shoulders, decreased lumbar lordosis, decreased thoracic kyphosis, and dowager's hump  PALPATION: Mildly TTP at B HS origin, tension noted throughout BLE, although she is flexible. R heel is TTP at insertion of PF.  LOWER EXTREMITY ROM: WNL B   LOWER EXTREMITY MMT: 4/5 B hip ext, otherwise 5/5. Trunk instability noted.   LOWER EXTREMITY SPECIAL TESTS:  Back- SLR and slump test-both negative  GAIT: Distance walked: In clinic distances Assistive device utilized: None Level of assistance: Complete Independence Comments: No obvious gait deviations- patient reports that her pain is not currently present.   TODAY'S TREATMENT:                                                                                                                              DATE:  03/11/23: Therex:  Nustep L5, 6 min Assessed ant hip flexibility B, restricted B, more so on R Instructed in the ex as indicated below for medbridge app, to address ant hip flexibility and lumbar motion in am  Manual:  side lying B for muscle energy to address Lumbar rotation, 5 sec holds, 6 to 10 reps each side Massage gun B to hamstring insertion SITS bones, and L piriformis   03/06/23 NuStep  L5 x 6 minutes Supine Deep tissue self mobs with tennis ball for B piriformis Supine stretching, figure 4 with strap, glut stretch Supine strengthening, clamshells against G tband resistance, pelvic tilts with 5 sec hold, bridge with pelvic tilt, 10 each. Seated HS stretch Standing mini squats and hip abd  against G tband resistance, 10 reps each  03/04/23 Education    PATIENT EDUCATION:  Education details: POC Person educated: Patient Education method: Explanation Education comprehension: verbalized understanding  HOME EXERCISE PROGRAM: Access Code: [email or web address] URL: [email or web address] Date: 03/11/2023 Prepared by: Zaida Reiland  Exercises - Supine Lower Trunk Rotation  - 1 x daily - 7 x weekly - 3 sets - 10 reps - Seated Flexion Stretch  - 1 x daily - 7 x weekly - 3 sets - 10 reps - Prone SIJ Anterior Rotation Mobilization on Table  - 1 x daily - 7 x weekly - 3 sets - 10 reps  ASSESSMENT:  CLINICAL IMPRESSION: Patient reports tennis ball works well.  Her worst times are first thing in am, when she has to climb steps to feed her cats and when walking dog outdoors.  She takes a good 45 min to loosen up, and her hamstrings feel like they are cramping,.  Better if she stays bent over.  Today further assessed her anterior hip flexibility with mild tightness noted. She is still tender B hamstrings proximally responded well to deep mass with theragun, mentioned dry needling to her but she is fearful of needles.  Continue to benefit from skilled PT to address her deficits.  OBJECTIVE IMPAIRMENTS: Abnormal gait, decreased activity tolerance, decreased balance, decreased coordination, difficulty walking, decreased ROM, decreased strength, obesity, and pain.   ACTIVITY LIMITATIONS: lifting, bending, sitting, squatting, sleeping, stairs, and locomotion level  PARTICIPATION LIMITATIONS: cleaning, laundry, shopping, and community activity  PERSONAL FACTORS: Past/current experiences  are also affecting patient's functional outcome.   REHAB POTENTIAL: Good  CLINICAL DECISION MAKING: Stable/uncomplicated  EVALUATION COMPLEXITY: Low   GOALS: Goals reviewed with patient? Yes  SHORT TERM GOALS: Target date: 03/17/23 I with initial HEP Baseline: Goal status: Provided with initial HEP  LONG TERM GOALS: Target date: 05/13/23  I final HEP Baseline:  Goal status: INITIAL  2.  Increase B hip strength to 5/5 Baseline:  Goal status: INITIAL  3.  Patient will increase activity tolerance to get through her full day with pain < 3/10 Baseline: 9/10 Goal status: INITIAL  4.  Patient will report resolution of her R heel pain with exercises and obtaining shoes offering arch support Baseline: Fluctuating R heel pain, worst in the morning. Goal status: INITIAL  PLAN:  PT FREQUENCY: 1-2x/week  PT DURATION: 10 weeks  PLANNED INTERVENTIONS: Therapeutic exercises, Therapeutic activity, Neuromuscular re-education, Balance training, Gait training, Patient/Family education, Self Care, Joint mobilization, Stair training, Dry Needling, Cryotherapy, Moist heat, Ionotophoresis 4mg /ml Dexamethasone, and Manual therapy  PLAN FOR NEXT SESSION: foot intrinsics, .   Harold Moncus Maxcine Ham, DPT 03/11/2023, 4:56 PM

## 2023-03-12 ENCOUNTER — Other Ambulatory Visit: Payer: Self-pay | Admitting: Internal Medicine

## 2023-03-13 ENCOUNTER — Ambulatory Visit: Payer: 59

## 2023-03-13 ENCOUNTER — Other Ambulatory Visit: Payer: Self-pay

## 2023-03-13 DIAGNOSIS — R293 Abnormal posture: Secondary | ICD-10-CM

## 2023-03-13 DIAGNOSIS — M7918 Myalgia, other site: Secondary | ICD-10-CM

## 2023-03-13 DIAGNOSIS — M6281 Muscle weakness (generalized): Secondary | ICD-10-CM

## 2023-03-13 DIAGNOSIS — M79671 Pain in right foot: Secondary | ICD-10-CM

## 2023-03-13 NOTE — Therapy (Signed)
[facility] OUTPATIENT PHYSICAL THERAPY LOWER EXTREMITY TREATMENT   Patient Name: Rebekah [name] MRN: [id number] DOB:[birth year], 61 y.o., female Today's Date: 03/13/2023  END OF SESSION:  PT End of Session - 03/13/23 1059     Visit Number 4    Date for PT Re-Evaluation 05/13/23    PT Start Time 1100    PT Stop Time 1145    PT Time Calculation (min) 45 min    Activity Tolerance Patient tolerated treatment well    Behavior During Therapy [facility] for tasks assessed/performed               Past Medical History:  Diagnosis Date   Allergy    Arthritis    Back pain    Bowel obstruction (HCC) 2019   Depression    Gall bladder stones    GERD (gastroesophageal reflux disease)    Hyperlipidemia    Hypertension    IBS (irritable bowel syndrome)    IBS (irritable bowel syndrome)    Joint pain    Obesity    Lap band surgery 12-08 Dr Daphine Deutscher   OSA (obstructive sleep apnea)    using a CPAP     Pre-diabetes    REDUCTION MAMMOPLASTY, HX OF 09/22/2007   Qualifier: Diagnosis of  By: Drue Novel MD, Nolon Rod    Sleep apnea    doesn't use CPAP   Past Surgical History:  Procedure Laterality Date   APPENDECTOMY     BREAST REDUCTION SURGERY     CHOLECYSTECTOMY     CLOSED REDUCTION FINGER WITH PERCUTANEOUS PINNING Left 09/14/2014   Procedure: CLOSED REDUCTION PERCUTANEOUS PINNING ;  Surgeon: Betha Loa, MD;  Location: [facility];  Service: Orthopedics;  Laterality: Left;   COLONOSCOPY     ESOPHAGEAL MANOMETRY N/A 09/23/2017   Procedure: ESOPHAGEAL MANOMETRY (EM);  Surgeon: Napoleon Form, MD;  Location: [facility];  Service: Endoscopy;  Laterality: N/A;   Lap Band Adjustment  09/01/2015   Dr. Andrey Campanile   LAPAROSCOPIC GASTRIC BANDING  09/22/2007   Lap band surgery 12-08 Dr Daphine Deutscher   NASAL SINUS SURGERY     TONSILLECTOMY     x2   UPPER GASTROINTESTINAL ENDOSCOPY     Patient Active Problem List   Diagnosis Date Noted   Atypical glandular cells on cervical Pap smear 07/26/2022    Osteopenia 01/29/2022   HGSIL (high grade squamous intraepithelial lesion) on Pap smear of cervix 07/12/2021   Metabolic syndrome 01/10/2020   Morbid obesity (HCC) 01/10/2020   Migraines 11/13/2019   Hyperglycemia, unspecified 07/21/2018   SBO (small bowel obstruction) (HCC) 09/28/2017   Dysphagia    PCP NOTES >>>> 09/25/2015   Annual physical exam 09/05/2011   GERD 10/12/2009   Hyperlipidemia 09/22/2007   Anxiety and depression 09/22/2007   Essential hypertension 09/22/2007   Seasonal and perennial allergic rhinitis 09/22/2007   Obstructive sleep apnea 09/22/2007    PCP: Wanda Plump, MD  REFERRING PROVIDER: Wanda Plump, MD  REFERRING DIAG:  Diagnosis  M79.18 (ICD-10-CM) - Buttock pain    THERAPY DIAG:  Abnormal posture  Pain of right heel  Muscle weakness (generalized)  Bilateral buttock pain  Rationale for Evaluation and Treatment: Rehabilitation  ONSET DATE: 02/25/23  SUBJECTIVE:   SUBJECTIVE STATEMENT: Haven't really been able to attempt the stretches as I still have my friend's dog and have to rush to get up in the morning.  Hobbling with my knees, hips, back bent over in am  PERTINENT HISTORY: Prediabetes HTN Hyperlipidemia: lipitor  aches (2018) Anxiety, depression (chronic sx, occ suicidal ideas, h/o self cutting). Used to see Dr Donell Beers GERD Migraines  Obesity, lap band surgery 2008, adjustment 2016 OSA: not using CPAP as off 06/2020 Acne (doxy) ++FH CAD: F, M , B MI age 37, G-parents  Osteopenia: T score -2.3   09/2020, Rx Fosamax SBO 09-2017 PAIN:  Are you having pain? Yes: NPRS scale: 9/10 Pain location: B hips into legs Pain description: spasm like Aggravating factors: sitting, walking, first thing in the morning. Relieving factors: Stretching sometimes helps, movement  PRECAUTIONS: None  WEIGHT BEARING RESTRICTIONS: No  FALLS:  Has patient fallen in last 6 months? No  LIVING ENVIRONMENT: Lives with: lives with their family and lives  with their spouse Lives in: House/apartment Stairs: Yes: Internal: 12 steps; on right going up and External: 2 steps; none, severely painful Has following equipment at home: None  OCCUPATION: [employer], on her feet, part time. She also likes to garden.  PLOF: Independent  PATIENT GOALS: Decrease [pain, cope with the pain better to be able to return to her normal activities.  NEXT MD VISIT: unknown  OBJECTIVE:   DIAGNOSTIC FINDINGS: N/A  COGNITION: Overall cognitive status: Within functional limits for tasks assessed     SENSATION: WFL  EDEMA:  Patient denies swelling.  MUSCLE LENGTH: Hamstrings: Right 80 deg; Left 80 deg Thomas test: WNL B  POSTURE: rounded shoulders, decreased lumbar lordosis, decreased thoracic kyphosis, and dowager's hump  PALPATION: Mildly TTP at B HS origin, tension noted throughout BLE, although she is flexible. R heel is TTP at insertion of PF.  LOWER EXTREMITY ROM: WNL B   LOWER EXTREMITY MMT: 4/5 B hip ext, otherwise 5/5. Trunk instability noted.   LOWER EXTREMITY SPECIAL TESTS:  Back- SLR and slump test-both negative  GAIT: Distance walked: In clinic distances Assistive device utilized: None Level of assistance: Complete Independence Comments: No obvious gait deviations- patient reports that her pain is not currently present.   TODAY'S TREATMENT:                                                                                                                              DATE:  03/13/23: Therapeutic exercise: Initiated session with supine bike 7 min, level 2 for tissue perfusion At end of session following manual techniques, instructed in seated tailors stretch, alternating hips and alternating ER and IR stretch each hip  Manual:  Prone for theragun massage B post hamstring attachments, , proximal hamstring muscle belly Supine for manual stretching by PT for each hamstring, 30 sec holds, 4 reps each leg.    03/11/23:  Therex:  Nustep L5, 6 min Assessed ant hip flexibility B, restricted B, more so on R Instructed in the ex as indicated below for medbridge app, to address ant hip flexibility and lumbar motion in am  Manual:  side lying B for muscle energy to address Lumbar rotation, 5 sec holds, 6 to 10 reps each  side Massage gun B to hamstring insertion SITS bones, and L piriformis   03/06/23 NuStep L5 x 6 minutes Supine Deep tissue self mobs with tennis ball for B piriformis Supine stretching, figure 4 with strap, glut stretch Supine strengthening, clamshells against G tband resistance, pelvic tilts with 5 sec hold, bridge with pelvic tilt, 10 each. Seated HS stretch Standing mini squats and hip abd against G tband resistance, 10 reps each  03/04/23 Education    PATIENT EDUCATION:  Education details: POC Person educated: Patient Education method: Explanation Education comprehension: verbalized understanding  HOME EXERCISE PROGRAM: Access Code: [email or web address] URL: [email or web address] Date: 03/11/2023 Prepared by: Cecile Guevara  Exercises - Supine Lower Trunk Rotation  - 1 x daily - 7 x weekly - 3 sets - 10 reps - Seated Flexion Stretch  - 1 x daily - 7 x weekly - 3 sets - 10 reps - Prone SIJ Anterior Rotation Mobilization on Table  - 1 x daily - 7 x weekly - 3 sets - 10 reps  ASSESSMENT:  CLINICAL IMPRESSION: Patient still reports  worst times are first thing in am, when she has to climb steps to feed her cats and when walking dog outdoors.  She takes a good 45 min to loosen up, and her hamstrings feel like they are cramping,.  Better if she stays bent over.  Today added more manual stretching, soft tissue myofascial release and isolated hamstring stretching to her routine.  She may benefit from instruction in hamstring eccentrics progression in future visits.  She will be going out of town next week and will see how she responds with time to pursue stretching while vacationing. .   Continue to benefit from skilled PT to address her deficits.  OBJECTIVE IMPAIRMENTS: Abnormal gait, decreased activity tolerance, decreased balance, decreased coordination, difficulty walking, decreased ROM, decreased strength, obesity, and pain.   ACTIVITY LIMITATIONS: lifting, bending, sitting, squatting, sleeping, stairs, and locomotion level  PARTICIPATION LIMITATIONS: cleaning, laundry, shopping, and community activity  PERSONAL FACTORS: Past/current experiences are also affecting patient's functional outcome.   REHAB POTENTIAL: Good  CLINICAL DECISION MAKING: Stable/uncomplicated  EVALUATION COMPLEXITY: Low   GOALS: Goals reviewed with patient? Yes  SHORT TERM GOALS: Target date: 03/17/23 I with initial HEP Baseline: Goal status: Provided with initial HEP  LONG TERM GOALS: Target date: 05/13/23  I final HEP Baseline:  Goal status: INITIAL  2.  Increase B hip strength to 5/5 Baseline:  Goal status: INITIAL  3.  Patient will increase activity tolerance to get through her full day with pain < 3/10 Baseline: 9/10 Goal status: INITIAL  4.  Patient will report resolution of her R heel pain with exercises and obtaining shoes offering arch support Baseline: Fluctuating R heel pain, worst in the morning. Goal status: INITIAL  PLAN:  PT FREQUENCY: 1-2x/week  PT DURATION: 10 weeks  PLANNED INTERVENTIONS: Therapeutic exercises, Therapeutic activity, Neuromuscular re-education, Balance training, Gait training, Patient/Family education, Self Care, Joint mobilization, Stair training, Dry Needling, Cryotherapy, Moist heat, Ionotophoresis 4mg /ml Dexamethasone, and Manual therapy  PLAN FOR NEXT SESSION: foot intrinsics, .   Waddell Iten, DPT 03/13/2023, 3:51 PM

## 2023-03-25 ENCOUNTER — Ambulatory Visit: Payer: 59 | Attending: Internal Medicine | Admitting: Physical Therapy

## 2023-03-25 ENCOUNTER — Encounter: Payer: Self-pay | Admitting: Physical Therapy

## 2023-03-25 DIAGNOSIS — M7918 Myalgia, other site: Secondary | ICD-10-CM | POA: Diagnosis present

## 2023-03-25 DIAGNOSIS — M6281 Muscle weakness (generalized): Secondary | ICD-10-CM | POA: Insufficient documentation

## 2023-03-25 DIAGNOSIS — R293 Abnormal posture: Secondary | ICD-10-CM | POA: Diagnosis present

## 2023-03-25 DIAGNOSIS — M79671 Pain in right foot: Secondary | ICD-10-CM | POA: Diagnosis present

## 2023-03-25 NOTE — Therapy (Signed)
[facility] OUTPATIENT PHYSICAL THERAPY LOWER EXTREMITY TREATMENT   Patient Name: Rebekah [name] MRN: [id number] DOB:[birth year], 61 y.o., female Today's Date: 03/25/2023  END OF SESSION:  PT End of Session - 03/25/23 0933     Visit Number 5    Date for PT Re-Evaluation 05/13/23    PT Start Time 0930    PT Stop Time 1010    PT Time Calculation (min) 40 min    Activity Tolerance Patient tolerated treatment well    Behavior During Therapy [facility] for tasks assessed/performed               Past Medical History:  Diagnosis Date   Allergy    Arthritis    Back pain    Bowel obstruction (HCC) 2019   Depression    Gall bladder stones    GERD (gastroesophageal reflux disease)    Hyperlipidemia    Hypertension    IBS (irritable bowel syndrome)    IBS (irritable bowel syndrome)    Joint pain    Obesity    Lap band surgery 12-08 Dr Daphine Deutscher   OSA (obstructive sleep apnea)    using a CPAP     Pre-diabetes    REDUCTION MAMMOPLASTY, HX OF 09/22/2007   Qualifier: Diagnosis of  By: Drue Novel MD, Nolon Rod    Sleep apnea    doesn't use CPAP   Past Surgical History:  Procedure Laterality Date   APPENDECTOMY     BREAST REDUCTION SURGERY     CHOLECYSTECTOMY     CLOSED REDUCTION FINGER WITH PERCUTANEOUS PINNING Left 09/14/2014   Procedure: CLOSED REDUCTION PERCUTANEOUS PINNING ;  Surgeon: Betha Loa, MD;  Location: [facility];  Service: Orthopedics;  Laterality: Left;   COLONOSCOPY     ESOPHAGEAL MANOMETRY N/A 09/23/2017   Procedure: ESOPHAGEAL MANOMETRY (EM);  Surgeon: Napoleon Form, MD;  Location: [facility];  Service: Endoscopy;  Laterality: N/A;   Lap Band Adjustment  09/01/2015   Dr. Andrey Campanile   LAPAROSCOPIC GASTRIC BANDING  09/22/2007   Lap band surgery 12-08 Dr Daphine Deutscher   NASAL SINUS SURGERY     TONSILLECTOMY     x2   UPPER GASTROINTESTINAL ENDOSCOPY     Patient Active Problem List   Diagnosis Date Noted   Atypical glandular cells on cervical Pap smear 07/26/2022    Osteopenia 01/29/2022   HGSIL (high grade squamous intraepithelial lesion) on Pap smear of cervix 07/12/2021   Metabolic syndrome 01/10/2020   Morbid obesity (HCC) 01/10/2020   Migraines 11/13/2019   Hyperglycemia, unspecified 07/21/2018   SBO (small bowel obstruction) (HCC) 09/28/2017   Dysphagia    PCP NOTES >>>> 09/25/2015   Annual physical exam 09/05/2011   GERD 10/12/2009   Hyperlipidemia 09/22/2007   Anxiety and depression 09/22/2007   Essential hypertension 09/22/2007   Seasonal and perennial allergic rhinitis 09/22/2007   Obstructive sleep apnea 09/22/2007    PCP: Wanda Plump, MD  REFERRING PROVIDER: Wanda Plump, MD  REFERRING DIAG:  Diagnosis  M79.18 (ICD-10-CM) - Buttock pain    THERAPY DIAG:  Abnormal posture  Pain of right heel  Muscle weakness (generalized)  Bilateral buttock pain  Rationale for Evaluation and Treatment: Rehabilitation  ONSET DATE: 02/25/23  SUBJECTIVE:   SUBJECTIVE STATEMENT:  Patient reports that she did more stretching while on vacation and the pain was much improved. She while on vacation without pain. The only thing that bothered her was climbing steps. After returning home she had to squat  for several hours  while weeding in her garden and the pain had returned.  PERTINENT HISTORY: Prediabetes HTN Hyperlipidemia: lipitor aches (2018) Anxiety, depression (chronic sx, occ suicidal ideas, h/o self cutting). Used to see Dr Donell Beers GERD Migraines  Obesity, lap band surgery 2008, adjustment 2016 OSA: not using CPAP as off 06/2020 Acne (doxy) ++FH CAD: F, M , B MI age 55, G-parents  Osteopenia: T score -2.3   09/2020, Rx Fosamax SBO 09-2017 PAIN:  Are you having pain? Yes: NPRS scale: 9/10 Pain location: B hips into legs Pain description: spasm like Aggravating factors: sitting, walking, first thing in the morning. Relieving factors: Stretching sometimes helps, movement  PRECAUTIONS: None  WEIGHT BEARING RESTRICTIONS:  No  FALLS:  Has patient fallen in last 6 months? No  LIVING ENVIRONMENT: Lives with: lives with their family and lives with their spouse Lives in: House/apartment Stairs: Yes: Internal: 12 steps; on right going up and External: 2 steps; none, severely painful Has following equipment at home: None  OCCUPATION: [employer] store, on her feet, part time. She also likes to garden.  PLOF: Independent  PATIENT GOALS: Decrease [pain, cope with the pain better to be able to return to her normal activities.  NEXT MD VISIT: unknown  OBJECTIVE:   DIAGNOSTIC FINDINGS: N/A  COGNITION: Overall cognitive status: Within functional limits for tasks assessed     SENSATION: WFL  EDEMA:  Patient denies swelling.  MUSCLE LENGTH: Hamstrings: Right 80 deg; Left 80 deg Thomas test: WNL B  POSTURE: rounded shoulders, decreased lumbar lordosis, decreased thoracic kyphosis, and dowager's hump  PALPATION: Mildly TTP at B HS origin, tension noted throughout BLE, although she is flexible. R heel is TTP at insertion of PF.  LOWER EXTREMITY ROM: WNL B   LOWER EXTREMITY MMT: 4/5 B hip ext, otherwise 5/5. Trunk instability noted.   LOWER EXTREMITY SPECIAL TESTS:  Back- SLR and slump test-both negative  GAIT: Distance walked: In clinic distances Assistive device utilized: None Level of assistance: Complete Independence Comments: No obvious gait deviations- patient reports that her pain is not currently present.   TODAY'S TREATMENT:                                                                                                                              DATE:  03/25/23 NuStep L5 x 6 STM to B piriformis muscles followed by AA stretch Supine stretch to piriformis Supine strengthening-clamshells, then bridge with hip abd, 2 x 10 each Seated piriformis stretch, hip flexor stretch. Dumbell squats and dead lift, 4#, 10 reps each, min VC for form.  03/13/23: Therapeutic exercise: Initiated  session with supine bike 7 min, level 2 for tissue perfusion At end of session following manual techniques, instructed in seated tailors stretch, alternating hips and alternating ER and IR stretch each hip  Manual:  Prone for theragun massage B post hamstring attachments, , proximal hamstring muscle belly Supine for manual stretching by PT for each hamstring, 30 sec holds,  4 reps each leg.    03/11/23: Therex:  Nustep L5, 6 min Assessed ant hip flexibility B, restricted B, more so on R Instructed in the ex as indicated below for medbridge app, to address ant hip flexibility and lumbar motion in am  Manual:  side lying B for muscle energy to address Lumbar rotation, 5 sec holds, 6 to 10 reps each side Massage gun B to hamstring insertion SITS bones, and L piriformis   03/06/23 NuStep L5 x 6 minutes Supine Deep tissue self mobs with tennis ball for B piriformis Supine stretching, figure 4 with strap, glut stretch Supine strengthening, clamshells against G tband resistance, pelvic tilts with 5 sec hold, bridge with pelvic tilt, 10 each. Seated HS stretch Standing mini squats and hip abd against G tband resistance, 10 reps each  03/04/23 Education   PATIENT EDUCATION:  Education details: POC Person educated: Patient Education method: Explanation Education comprehension: verbalized understanding  HOME EXERCISE PROGRAM: Access Code: [email or web address] URL: [email or web address] Date: 03/11/2023 Prepared by: Amy Speaks  Exercises - Supine Lower Trunk Rotation  - 1 x daily - 7 x weekly - 3 sets - 10 reps - Seated Flexion Stretch  - 1 x daily - 7 x weekly - 3 sets - 10 reps - Prone SIJ Anterior Rotation Mobilization on Table  - 1 x daily - 7 x weekly - 3 sets - 10 reps  ASSESSMENT:  CLINICAL IMPRESSION: Patient reports that while on vacation she was able to stretch more and her pain improved. However, upon return home she squatted while weeding and her pain has returned.  Treatment involved progression of STM to B piriformis muscles, HS were not as tender today. She then performed stretching and strengthening to engage the muscles for stability.  OBJECTIVE IMPAIRMENTS: Abnormal gait, decreased activity tolerance, decreased balance, decreased coordination, difficulty walking, decreased ROM, decreased strength, obesity, and pain.   ACTIVITY LIMITATIONS: lifting, bending, sitting, squatting, sleeping, stairs, and locomotion level  PARTICIPATION LIMITATIONS: cleaning, laundry, shopping, and community activity  PERSONAL FACTORS: Past/current experiences are also affecting patient's functional outcome.   REHAB POTENTIAL: Good  CLINICAL DECISION MAKING: Stable/uncomplicated  EVALUATION COMPLEXITY: Low   GOALS: Goals reviewed with patient? Yes  SHORT TERM GOALS: Target date: 03/17/23 I with initial HEP Baseline: Goal status: Provided with initial HEP  LONG TERM GOALS: Target date: 05/13/23  I final HEP Baseline:  Goal status: INITIAL  2.  Increase B hip strength to 5/5 Baseline:  Goal status: INITIAL  3.  Patient will increase activity tolerance to get through her full day with pain < 3/10 Baseline: 9/10 Goal status: 03/25/23-Up to 7/10, ongoing  4.  Patient will report resolution of her R heel pain with exercises and obtaining shoes offering arch support Baseline: Fluctuating R heel pain, worst in the morning. Goal status:03/25/23 Going to pick out some new shoes today-ongoing  PLAN:  PT FREQUENCY: 1-2x/week  PT DURATION: 10 weeks  PLANNED INTERVENTIONS: Therapeutic exercises, Therapeutic activity, Neuromuscular re-education, Balance training, Gait training, Patient/Family education, Self Care, Joint mobilization, Stair training, Dry Needling, Cryotherapy, Moist heat, Ionotophoresis 4mg /ml Dexamethasone, and Manual therapy  PLAN FOR NEXT SESSION: foot intrinsics, .   Oley Balm DPT 03/25/23 10:13 AM

## 2023-03-27 ENCOUNTER — Ambulatory Visit: Payer: 59 | Admitting: Physical Therapy

## 2023-03-29 ENCOUNTER — Ambulatory Visit: Payer: 59 | Admitting: Physical Therapy

## 2023-03-29 ENCOUNTER — Encounter: Payer: Self-pay | Admitting: Physical Therapy

## 2023-03-29 DIAGNOSIS — M79671 Pain in right foot: Secondary | ICD-10-CM

## 2023-03-29 DIAGNOSIS — M6281 Muscle weakness (generalized): Secondary | ICD-10-CM

## 2023-03-29 DIAGNOSIS — R293 Abnormal posture: Secondary | ICD-10-CM | POA: Diagnosis not present

## 2023-03-29 DIAGNOSIS — M7918 Myalgia, other site: Secondary | ICD-10-CM

## 2023-03-29 NOTE — Therapy (Signed)
[facility] OUTPATIENT PHYSICAL THERAPY LOWER EXTREMITY TREATMENT   Patient Name: TED ALEJANDRO MRN: [id number] DOB:09/14/1961, 62 y.o., female Today's Date: 03/29/2023  END OF SESSION:  PT End of Session - 03/29/23 0839     Visit Number 6    Date for PT Re-Evaluation 05/13/23    PT Start Time 0837    PT Stop Time 0920    PT Time Calculation (min) 43 min    Activity Tolerance Patient tolerated treatment well    Behavior During Therapy [facility] for tasks assessed/performed               Past Medical History:  Diagnosis Date   Allergy    Arthritis    Back pain    Bowel obstruction (HCC) 2019   Depression    Gall bladder stones    GERD (gastroesophageal reflux disease)    Hyperlipidemia    Hypertension    IBS (irritable bowel syndrome)    IBS (irritable bowel syndrome)    Joint pain    Obesity    Lap band surgery 12-08 Dr Daphine Deutscher   OSA (obstructive sleep apnea)    using a CPAP     Pre-diabetes    REDUCTION MAMMOPLASTY, HX OF 09/22/2007   Qualifier: Diagnosis of  By: Drue Novel MD, Nolon Rod    Sleep apnea    doesn't use CPAP   Past Surgical History:  Procedure Laterality Date   APPENDECTOMY     BREAST REDUCTION SURGERY     CHOLECYSTECTOMY     CLOSED REDUCTION FINGER WITH PERCUTANEOUS PINNING Left 09/14/2014   Procedure: CLOSED REDUCTION PERCUTANEOUS PINNING ;  Surgeon: Betha Loa, MD;  Location: [facility];  Service: Orthopedics;  Laterality: Left;   COLONOSCOPY     ESOPHAGEAL MANOMETRY N/A 09/23/2017   Procedure: ESOPHAGEAL MANOMETRY (EM);  Surgeon: Napoleon Form, MD;  Location: [facility];  Service: Endoscopy;  Laterality: N/A;   Lap Band Adjustment  09/01/2015   Dr. Andrey Campanile   LAPAROSCOPIC GASTRIC BANDING  09/22/2007   Lap band surgery 12-08 Dr Daphine Deutscher   NASAL SINUS SURGERY     TONSILLECTOMY     x2   UPPER GASTROINTESTINAL ENDOSCOPY     Patient Active Problem List   Diagnosis Date Noted   Atypical glandular cells on cervical Pap smear 07/26/2022    Osteopenia 01/29/2022   HGSIL (high grade squamous intraepithelial lesion) on Pap smear of cervix 07/12/2021   Metabolic syndrome 01/10/2020   Morbid obesity (HCC) 01/10/2020   Migraines 11/13/2019   Hyperglycemia, unspecified 07/21/2018   SBO (small bowel obstruction) (HCC) 09/28/2017   Dysphagia    PCP NOTES >>>> 09/25/2015   Annual physical exam 09/05/2011   GERD 10/12/2009   Hyperlipidemia 09/22/2007   Anxiety and depression 09/22/2007   Essential hypertension 09/22/2007   Seasonal and perennial allergic rhinitis 09/22/2007   Obstructive sleep apnea 09/22/2007    PCP: Wanda Plump, MD  REFERRING PROVIDER: Wanda Plump, MD  REFERRING DIAG:  Diagnosis  M79.18 (ICD-10-CM) - Buttock pain    THERAPY DIAG:  Abnormal posture  Pain of right heel  Muscle weakness (generalized)  Bilateral buttock pain  Rationale for Evaluation and Treatment: Rehabilitation  ONSET DATE: 02/25/23  SUBJECTIVE:   SUBJECTIVE STATEMENT:  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  PERTINENT HISTORY: Prediabetes HTN Hyperlipidemia: lipitor aches (2018) Anxiety, depression (chronic sx, occ suicidal ideas, h/o self cutting). Used to see Dr Donell Beers GERD Migraines  Obesity,  lap band surgery 2008, adjustment 2016 OSA: not using CPAP as off 06/2020 Acne (doxy) ++FH CAD: F, M , B MI age 74, G-parents  Osteopenia: T score -2.3   09/2020, Rx Fosamax SBO 09-2017 PAIN:  Are you having pain? Yes: NPRS scale: 1/10 Pain location: B hips into legs Pain description: spasm like Aggravating factors: sitting, walking, first thing in the morning. Relieving factors: Stretching sometimes helps, movement  PRECAUTIONS: None  WEIGHT BEARING RESTRICTIONS: No  FALLS:  Has patient fallen in last 6 months? No  LIVING ENVIRONMENT: Lives with: lives with their family and lives with their spouse Lives in: House/apartment Stairs: Yes: Internal: 12 steps; on right going up and  External: 2 steps; none, severely painful Has following equipment at home: None  OCCUPATION: [employer] store, on her feet, part time. She also likes to garden.  PLOF: Independent  PATIENT GOALS: Decrease [pain, cope with the pain better to be able to return to her normal activities.  NEXT MD VISIT: unknown  OBJECTIVE:   DIAGNOSTIC FINDINGS: N/A  COGNITION: Overall cognitive status: Within functional limits for tasks assessed     SENSATION: WFL  EDEMA:  Patient denies swelling.  MUSCLE LENGTH: Hamstrings: Right 80 deg; Left 80 deg Thomas test: WNL B  POSTURE: rounded shoulders, decreased lumbar lordosis, decreased thoracic kyphosis, and dowager's hump  PALPATION: Mildly TTP at B HS origin, tension noted throughout BLE, although she is flexible. R heel is TTP at insertion of PF.  LOWER EXTREMITY ROM: WNL B   LOWER EXTREMITY MMT: 4/5 B hip ext, otherwise 5/5. Trunk instability noted.   LOWER EXTREMITY SPECIAL TESTS:  Back- SLR and slump test-both negative  GAIT: Distance walked: In clinic distances Assistive device utilized: None Level of assistance: Complete Independence Comments: No obvious gait deviations- patient reports that her pain is not currently present.   TODAY'S TREATMENT:                                                                                                                              DATE:  03/29/23 NuStep L5 x 6 min S2S OHP yellow ball 2x10 Shoulder Ext 5lb 2x10 Standing row 10lb 2x10 Supine stretch to piriformis, HS, Cl[place], ITB LE on pball bridges, Oblq, K2C Double K2C stretch   03/25/23 NuStep L5 x 6 STM to B piriformis muscles followed by AA stretch Supine stretch to piriformis Supine strengthening-clamshells, then bridge with hip abd, 2 x 10 each Seated piriformis stretch, hip flexor stretch. Dumbell squats and dead lift, 4#, 10 reps each, min VC for form.  03/13/23: Therapeutic exercise: Initiated session with supine bike 7  min, level 2 for tissue perfusion At end of session following manual techniques, instructed in seated tailors stretch, alternating hips and alternating ER and IR stretch each hip  Manual:  Prone for theragun massage B post hamstring attachments, , proximal hamstring muscle belly Supine for manual stretching by PT for each hamstring, 30 sec holds, 4  reps each leg.    03/11/23: Therex:  Nustep L5, 6 min Assessed ant hip flexibility B, restricted B, more so on R Instructed in the ex as indicated below for medbridge app, to address ant hip flexibility and lumbar motion in am  Manual:  side lying B for muscle energy to address Lumbar rotation, 5 sec holds, 6 to 10 reps each side Massage gun B to hamstring insertion SITS bones, and L piriformis   03/06/23 NuStep L5 x 6 minutes Supine Deep tissue self mobs with tennis ball for B piriformis Supine stretching, figure 4 with strap, glut stretch Supine strengthening, clamshells against G tband resistance, pelvic tilts with 5 sec hold, bridge with pelvic tilt, 10 each. Seated HS stretch Standing mini squats and hip abd against G tband resistance, 10 reps each  03/04/23 Education   PATIENT EDUCATION:  Education details: POC Person educated: Patient Education method: Explanation Education comprehension: verbalized understanding  HOME EXERCISE PROGRAM: Access Code: [email or web address] URL: [email or web address] Date: 03/11/2023 Prepared by: Amy Speaks  Exercises - Supine Lower Trunk Rotation  - 1 x daily - 7 x weekly - 3 sets - 10 reps - Seated Flexion Stretch  - 1 x daily - 7 x weekly - 3 sets - 10 reps - Prone SIJ Anterior Rotation Mobilization on Table  - 1 x daily - 7 x weekly - 3 sets - 10 reps  ASSESSMENT:  CLINICAL IMPRESSION: Treatment involved progression of STM to B piriformis muscles, she had good LE flexibility overall. Performed stretching and strengthening to engage the muscles for stability. Mild cramping in the L glute  area with standing rows. After checking squat mechanics pt said it did bother her, but the pain came from when she goes up the stairs.  OBJECTIVE IMPAIRMENTS: Abnormal gait, decreased activity tolerance, decreased balance, decreased coordination, difficulty walking, decreased ROM, decreased strength, obesity, and pain.   ACTIVITY LIMITATIONS: lifting, bending, sitting, squatting, sleeping, stairs, and locomotion level  PARTICIPATION LIMITATIONS: cleaning, laundry, shopping, and community activity  PERSONAL FACTORS: Past/current experiences are also affecting patient's functional outcome.   REHAB POTENTIAL: Good  CLINICAL DECISION MAKING: Stable/uncomplicated  EVALUATION COMPLEXITY: Low   GOALS: Goals reviewed with patient? Yes  SHORT TERM GOALS: Target date: 03/17/23 I with initial HEP Baseline: Goal status: Provided with initial HEP  LONG TERM GOALS: Target date: 05/13/23  I final HEP Baseline:  Goal status: INITIAL  2.  Increase B hip strength to 5/5 Baseline:  Goal status: INITIAL  3.  Patient will increase activity tolerance to get through her full day with pain < 3/10 Baseline: 9/10 Goal status: 03/25/23-Up to 7/10, ongoing  4.  Patient will report resolution of her R heel pain with exercises and obtaining shoes offering arch support Baseline: Fluctuating R heel pain, worst in the morning. Goal status:03/25/23 Going to pick out some new shoes today-ongoing  PLAN:  PT FREQUENCY: 1-2x/week  PT DURATION: 10 weeks  PLANNED INTERVENTIONS: Therapeutic exercises, Therapeutic activity, Neuromuscular re-education, Balance training, Gait training, Patient/Family education, Self Care, Joint mobilization, Stair training, Dry Needling, Cryotherapy, Moist heat, Ionotophoresis 4mg /ml Dexamethasone, and Manual therapy  PLAN FOR NEXT SESSION: Check hip extensor strength .   Debroah Baller, PTA 03/29/23 8:40 AM

## 2023-04-01 ENCOUNTER — Ambulatory Visit: Payer: 59

## 2023-04-01 ENCOUNTER — Other Ambulatory Visit: Payer: Self-pay

## 2023-04-01 DIAGNOSIS — M7918 Myalgia, other site: Secondary | ICD-10-CM

## 2023-04-01 DIAGNOSIS — M79671 Pain in right foot: Secondary | ICD-10-CM

## 2023-04-01 DIAGNOSIS — M6281 Muscle weakness (generalized): Secondary | ICD-10-CM

## 2023-04-01 DIAGNOSIS — R293 Abnormal posture: Secondary | ICD-10-CM | POA: Diagnosis not present

## 2023-04-01 NOTE — Therapy (Signed)
[facility] OUTPATIENT PHYSICAL THERAPY LOWER EXTREMITY TREATMENT   Patient Name: Rebekah [name] MRN: [id number] DOB:1961-09-14, 62 y.o., female Today's Date: 04/01/2023  END OF SESSION:  PT End of Session - 04/01/23 0900     Visit Number 7    Date for PT Re-Evaluation 05/13/23    PT Start Time 0852    PT Stop Time 0930    PT Time Calculation (min) 38 min    Activity Tolerance Patient tolerated treatment well    Behavior During Therapy [facility] for tasks assessed/performed               Past Medical History:  Diagnosis Date   Allergy    Arthritis    Back pain    Bowel obstruction (HCC) 2019   Depression    Gall bladder stones    GERD (gastroesophageal reflux disease)    Hyperlipidemia    Hypertension    IBS (irritable bowel syndrome)    IBS (irritable bowel syndrome)    Joint pain    Obesity    Lap band surgery 12-08 Dr Daphine Deutscher   OSA (obstructive sleep apnea)    using a CPAP     Pre-diabetes    REDUCTION MAMMOPLASTY, HX OF 09/22/2007   Qualifier: Diagnosis of  By: Drue Novel MD, Nolon Rod    Sleep apnea    doesn't use CPAP   Past Surgical History:  Procedure Laterality Date   APPENDECTOMY     BREAST REDUCTION SURGERY     CHOLECYSTECTOMY     CLOSED REDUCTION FINGER WITH PERCUTANEOUS PINNING Left 09/14/2014   Procedure: CLOSED REDUCTION PERCUTANEOUS PINNING ;  Surgeon: Betha Loa, MD;  Location: [facility];  Service: Orthopedics;  Laterality: Left;   COLONOSCOPY     ESOPHAGEAL MANOMETRY N/A 09/23/2017   Procedure: ESOPHAGEAL MANOMETRY (EM);  Surgeon: Napoleon Form, MD;  Location: [facility];  Service: Endoscopy;  Laterality: N/A;   Lap Band Adjustment  09/01/2015   Dr. Andrey Campanile   LAPAROSCOPIC GASTRIC BANDING  09/22/2007   Lap band surgery 12-08 Dr Daphine Deutscher   NASAL SINUS SURGERY     TONSILLECTOMY     x2   UPPER GASTROINTESTINAL ENDOSCOPY     Patient Active Problem List   Diagnosis Date Noted   Atypical glandular cells on cervical Pap smear 07/26/2022    Osteopenia 01/29/2022   HGSIL (high grade squamous intraepithelial lesion) on Pap smear of cervix 07/12/2021   Metabolic syndrome 01/10/2020   Morbid obesity (HCC) 01/10/2020   Migraines 11/13/2019   Hyperglycemia, unspecified 07/21/2018   SBO (small bowel obstruction) (HCC) 09/28/2017   Dysphagia    PCP NOTES >>>> 09/25/2015   Annual physical exam 09/05/2011   GERD 10/12/2009   Hyperlipidemia 09/22/2007   Anxiety and depression 09/22/2007   Essential hypertension 09/22/2007   Seasonal and perennial allergic rhinitis 09/22/2007   Obstructive sleep apnea 09/22/2007    PCP: Wanda Plump, MD  REFERRING PROVIDER: Wanda Plump, MD  REFERRING DIAG:  Diagnosis  M79.18 (ICD-10-CM) - Buttock pain    THERAPY DIAG:  Abnormal posture  Pain of right heel  Muscle weakness (generalized)  Bilateral buttock pain  Rationale for Evaluation and Treatment: Rehabilitation  ONSET DATE: 02/25/23  SUBJECTIVE:   SUBJECTIVE STATEMENT:   PERTINENT HISTORY: Prediabetes HTN Hyperlipidemia: lipitor aches (2018) Anxiety, depression (chronic sx, occ suicidal ideas, h/o self cutting). Used to see Dr Donell Beers GERD Migraines  Obesity, lap band surgery 2008, adjustment 2016 OSA: not using CPAP as off 06/2020 Acne (  doxy) ++FH CAD: F, M , B MI age 64, G-parents  Osteopenia: T score -2.3   09/2020, Rx Fosamax SBO 09-2017 PAIN:  Are you having pain? Yes: NPRS scale: 1/10 Pain location: B hips into legs Pain description: spasm like Aggravating factors: sitting, walking, first thing in the morning. Relieving factors: Stretching sometimes helps, movement  PRECAUTIONS: None  WEIGHT BEARING RESTRICTIONS: No  FALLS:  Has patient fallen in last 6 months? No  LIVING ENVIRONMENT: Lives with: lives with their family and lives with their spouse Lives in: House/apartment Stairs: Yes: Internal: 12 steps; on right going up and External: 2 steps; none, severely painful Has following equipment at home:  None  OCCUPATION: [employer], on her feet, part time. She also likes to garden.  PLOF: Independent  PATIENT GOALS: Decrease [pain, cope with the pain better to be able to return to her normal activities.  NEXT MD VISIT: unknown  OBJECTIVE:   DIAGNOSTIC FINDINGS: N/A  COGNITION: Overall cognitive status: Within functional limits for tasks assessed     SENSATION: WFL  EDEMA:  Patient denies swelling.  MUSCLE LENGTH: Hamstrings: Right 80 deg; Left 80 deg Thomas test: WNL B  POSTURE: rounded shoulders, decreased lumbar lordosis, decreased thoracic kyphosis, and dowager's hump  PALPATION: Mildly TTP at B HS origin, tension noted throughout BLE, although she is flexible. R heel is TTP at insertion of PF.  LOWER EXTREMITY ROM: WNL B   LOWER EXTREMITY MMT: 4/5 B hip ext, otherwise 5/5. Trunk instability noted.   LOWER EXTREMITY SPECIAL TESTS:  Back- SLR and slump test-both negative  GAIT: Distance walked: In clinic distances Assistive device utilized: None Level of assistance: Complete Independence Comments: No obvious gait deviations- patient reports that her pain is not currently present.   TODAY'S TREATMENT:                                                                                                                              DATE:  04/01/23: Manual:  prone for deep cross friction massage, theragun B over piriformis, hamstrings at proximal attachment B Deep pressure by PT over piriformis Supine for manual stretching each hamstring, noted with ankle dorsiflexion unable to isolate hamstrings, stretch is located more in plantarflexors, therefore therapist provided overpressure for knee capsule B with ankle in neutral position , with better hamstrings isolation  Therx: Added door frame stretch for hamstrings which patient was able to perform capably.  Also reviewe LTR , bridging prior to getting up in AM to improve spine mobility Also standing in door frame for  lateral glides pelvis, patient noted some L leg pain/ familiar pain with L lateral pelvic glides, which improved with repetition  03/29/23 NuStep L5 x 6 min S2S OHP yellow ball 2x10 Shoulder Ext 5lb 2x10 Standing row 10lb 2x10 Supine stretch to piriformis, HS, Clute, ITB LE on pball bridges, Oblq, K2C Double K2C stretch   03/25/23 NuStep L5 x 6 STM to B piriformis  muscles followed by AA stretch Supine stretch to piriformis Supine strengthening-clamshells, then bridge with hip abd, 2 x 10 each Seated piriformis stretch, hip flexor stretch. Dumbell squats and dead lift, 4#, 10 reps each, min VC for form.  03/13/23: Therapeutic exercise: Initiated session with supine bike 7 min, level 2 for tissue perfusion At end of session following manual techniques, instructed in seated tailors stretch, alternating hips and alternating ER and IR stretch each hip  Manual:  Prone for theragun massage B post hamstring attachments, , proximal hamstring muscle belly Supine for manual stretching by PT for each hamstring, 30 sec holds, 4 reps each leg.    03/11/23: Therex:  Nustep L5, 6 min Assessed ant hip flexibility B, restricted B, more so on R Instructed in the ex as indicated below for medbridge app, to address ant hip flexibility and lumbar motion in am  Manual:  side lying B for muscle energy to address Lumbar rotation, 5 sec holds, 6 to 10 reps each side Massage gun B to hamstring insertion SITS bones, and L piriformis   03/06/23 NuStep L5 x 6 minutes Supine Deep tissue self mobs with tennis ball for B piriformis Supine stretching, figure 4 with strap, glut stretch Supine strengthening, clamshells against G tband resistance, pelvic tilts with 5 sec hold, bridge with pelvic tilt, 10 each. Seated HS stretch Standing mini squats and hip abd against G tband resistance, 10 reps each  03/04/23 Education   PATIENT EDUCATION:  Education details: POC Person educated: Patient Education method:  Explanation Education comprehension: verbalized understanding  HOME EXERCISE PROGRAM: Access Code: [email or web address] URL: [email or web address] Date: 03/11/2023 Prepared by: Kerrianne Jeng  Exercises - Supine Lower Trunk Rotation  - 1 x daily - 7 x weekly - 3 sets - 10 reps - Seated Flexion Stretch  - 1 x daily - 7 x weekly - 3 sets - 10 reps - Prone SIJ Anterior Rotation Mobilization on Table  - 1 x daily - 7 x weekly - 3 sets - 10 reps  ASSESSMENT:  CLINICAL IMPRESSION: Treatment involved progression of STM to B piriformis muscles and hamstrings. In discussion with patient her  pain is worse after prolonged stitic positioning such as in am and after prolonged sitting, which is more indicative of lumbar spine radiculopathy/ joint restrictions, so tried to address lumbar spine some . Whe will return next week and will reassess again at that time.  OBJECTIVE IMPAIRMENTS: Abnormal gait, decreased activity tolerance, decreased balance, decreased coordination, difficulty walking, decreased ROM, decreased strength, obesity, and pain.   ACTIVITY LIMITATIONS: lifting, bending, sitting, squatting, sleeping, stairs, and locomotion level  PARTICIPATION LIMITATIONS: cleaning, laundry, shopping, and community activity  PERSONAL FACTORS: Past/current experiences are also affecting patient's functional outcome.   REHAB POTENTIAL: Good  CLINICAL DECISION MAKING: Stable/uncomplicated  EVALUATION COMPLEXITY: Low   GOALS: Goals reviewed with patient? Yes  SHORT TERM GOALS: Target date: 03/17/23 I with initial HEP Baseline: Goal status: Provided with initial HEP  LONG TERM GOALS: Target date: 05/13/23  I final HEP Baseline:  Goal status: INITIAL  2.  Increase B hip strength to 5/5 Baseline:  Goal status: INITIAL  3.  Patient will increase activity tolerance to get through her full day with pain < 3/10 Baseline: 9/10 Goal status: 03/25/23-Up to 7/10, ongoing  4.  Patient will report  resolution of her R heel pain with exercises and obtaining shoes offering arch support Baseline: Fluctuating R heel pain, worst in the morning. Goal status:03/25/23 Going to pick out some  new shoes today-ongoing  PLAN:  PT FREQUENCY: 1-2x/week  PT DURATION: 10 weeks  PLANNED INTERVENTIONS: Therapeutic exercises, Therapeutic activity, Neuromuscular re-education, Balance training, Gait training, Patient/Family education, Self Care, Joint mobilization, Stair training, Dry Needling, Cryotherapy, Moist heat, Ionotophoresis 4mg /ml Dexamethasone, and Manual therapy  PLAN FOR NEXT SESSION: how was new hamstring stretch.  Caralee Ates, PT, DPT 04/01/23 5:05 PM

## 2023-04-03 ENCOUNTER — Encounter: Payer: Self-pay | Admitting: Internal Medicine

## 2023-04-03 ENCOUNTER — Encounter (HOSPITAL_BASED_OUTPATIENT_CLINIC_OR_DEPARTMENT_OTHER): Payer: Self-pay

## 2023-04-04 MED ORDER — SEMAGLUTIDE-WEIGHT MANAGEMENT 2.4 MG/0.75ML ~~LOC~~ SOAJ: 2.4000 mg | SUBCUTANEOUS | 0 refills | Status: DC

## 2023-04-04 MED ORDER — SEMAGLUTIDE-WEIGHT MANAGEMENT 1 MG/0.5ML ~~LOC~~ SOAJ: 1.0000 mg | SUBCUTANEOUS | 0 refills | Status: DC

## 2023-04-04 MED ORDER — SEMAGLUTIDE-WEIGHT MANAGEMENT 0.5 MG/0.5ML ~~LOC~~ SOAJ: 0.5000 mg | SUBCUTANEOUS | 0 refills | Status: AC

## 2023-04-04 MED ORDER — SEMAGLUTIDE-WEIGHT MANAGEMENT 1.7 MG/0.75ML ~~LOC~~ SOAJ: 1.7000 mg | SUBCUTANEOUS | 0 refills | Status: DC

## 2023-04-04 NOTE — Telephone Encounter (Signed)
Okay to prescribe wegovy to see if coverage is available?

## 2023-04-08 ENCOUNTER — Ambulatory Visit: Payer: 59 | Admitting: Physical Therapy

## 2023-04-08 ENCOUNTER — Encounter: Payer: Self-pay | Admitting: Physical Therapy

## 2023-04-08 ENCOUNTER — Other Ambulatory Visit (HOSPITAL_COMMUNITY): Payer: Self-pay

## 2023-04-08 ENCOUNTER — Telehealth: Payer: Self-pay

## 2023-04-08 DIAGNOSIS — M6281 Muscle weakness (generalized): Secondary | ICD-10-CM

## 2023-04-08 DIAGNOSIS — M79671 Pain in right foot: Secondary | ICD-10-CM

## 2023-04-08 DIAGNOSIS — M7918 Myalgia, other site: Secondary | ICD-10-CM

## 2023-04-08 DIAGNOSIS — R293 Abnormal posture: Secondary | ICD-10-CM | POA: Diagnosis not present

## 2023-04-08 NOTE — Telephone Encounter (Signed)
Pharmacy Patient Advocate Encounter   Received notification from RN that prior authorization for [facility] is required/requested.   PA submitted to [facility] via CoverMyMeds Key or (Medicaid) confirmation # [id number]   Status is pending

## 2023-04-08 NOTE — Therapy (Signed)
[facility] OUTPATIENT PHYSICAL THERAPY LOWER EXTREMITY TREATMENT   Patient Name: Rebekah [name] MRN: [id number] DOB:1961/03/08, 62 y.o., female Today's Date: 04/08/2023  END OF SESSION:  PT End of Session - 04/08/23 0927     Visit Number 8    Date for PT Re-Evaluation 05/13/23    PT Start Time 0927    PT Stop Time 1010    PT Time Calculation (min) 43 min    Activity Tolerance Patient tolerated treatment well    Behavior During Therapy [facility] for tasks assessed/performed               Past Medical History:  Diagnosis Date   Allergy    Arthritis    Back pain    Bowel obstruction (HCC) 2019   Depression    Gall bladder stones    GERD (gastroesophageal reflux disease)    Hyperlipidemia    Hypertension    IBS (irritable bowel syndrome)    IBS (irritable bowel syndrome)    Joint pain    Obesity    Lap band surgery 12-08 Dr Daphine Deutscher   OSA (obstructive sleep apnea)    using a CPAP     Pre-diabetes    REDUCTION MAMMOPLASTY, HX OF 09/22/2007   Qualifier: Diagnosis of  By: Drue Novel MD, Nolon Rod    Sleep apnea    doesn't use CPAP   Past Surgical History:  Procedure Laterality Date   APPENDECTOMY     BREAST REDUCTION SURGERY     CHOLECYSTECTOMY     CLOSED REDUCTION FINGER WITH PERCUTANEOUS PINNING Left 09/14/2014   Procedure: CLOSED REDUCTION PERCUTANEOUS PINNING ;  Surgeon: Betha Loa, MD;  Location: [facility];  Service: Orthopedics;  Laterality: Left;   COLONOSCOPY     ESOPHAGEAL MANOMETRY N/A 09/23/2017   Procedure: ESOPHAGEAL MANOMETRY (EM);  Surgeon: Napoleon Form, MD;  Location: [facility];  Service: Endoscopy;  Laterality: N/A;   Lap Band Adjustment  09/01/2015   Dr. Andrey Campanile   LAPAROSCOPIC GASTRIC BANDING  09/22/2007   Lap band surgery 12-08 Dr Daphine Deutscher   NASAL SINUS SURGERY     TONSILLECTOMY     x2   UPPER GASTROINTESTINAL ENDOSCOPY     Patient Active Problem List   Diagnosis Date Noted   Atypical glandular cells on cervical Pap smear 07/26/2022    Osteopenia 01/29/2022   HGSIL (high grade squamous intraepithelial lesion) on Pap smear of cervix 07/12/2021   Metabolic syndrome 01/10/2020   Morbid obesity (HCC) 01/10/2020   Migraines 11/13/2019   Hyperglycemia, unspecified 07/21/2018   SBO (small bowel obstruction) (HCC) 09/28/2017   Dysphagia    PCP NOTES >>>> 09/25/2015   Annual physical exam 09/05/2011   GERD 10/12/2009   Hyperlipidemia 09/22/2007   Anxiety and depression 09/22/2007   Essential hypertension 09/22/2007   Seasonal and perennial allergic rhinitis 09/22/2007   Obstructive sleep apnea 09/22/2007    PCP: Wanda Plump, MD  REFERRING PROVIDER: Wanda Plump, MD  REFERRING DIAG:  Diagnosis  M79.18 (ICD-10-CM) - Buttock pain    THERAPY DIAG:  Abnormal posture  Pain of right heel  Muscle weakness (generalized)  Bilateral buttock pain  Rationale for Evaluation and Treatment: Rehabilitation  ONSET DATE: 02/25/23  SUBJECTIVE:   SUBJECTIVE STATEMENT:  Patient reports that her pain improves with stretching, but continues to interfere in daily activities. She reached up to place a kayak on a rack and experienced severe pain  down the back of both legs.  PERTINENT HISTORY: Prediabetes HTN Hyperlipidemia:  lipitor aches (2018) Anxiety, depression (chronic sx, occ suicidal ideas, h/o self cutting). Used to see Dr Donell Beers GERD Migraines  Obesity, lap band surgery 2008, adjustment 2016 OSA: not using CPAP as off 06/2020 Acne (doxy) ++FH CAD: F, M , B MI age 20, G-parents  Osteopenia: T score -2.3   09/2020, Rx Fosamax SBO 09-2017 PAIN:  Are you having pain? Yes: NPRS scale: 1/10 Pain location: B hips into legs Pain description: spasm like Aggravating factors: sitting, walking, first thing in the morning. Relieving factors: Stretching sometimes helps, movement  PRECAUTIONS: None  WEIGHT BEARING RESTRICTIONS: No  FALLS:  Has patient fallen in last 6 months? No  LIVING ENVIRONMENT: Lives with: lives  with their family and lives with their spouse Lives in: House/apartment Stairs: Yes: Internal: 12 steps; on right going up and External: 2 steps; none, severely painful Has following equipment at home: None  OCCUPATION: [employer], on her feet, part time. She also likes to garden.  PLOF: Independent  PATIENT GOALS: Decrease [pain, cope with the pain better to be able to return to her normal activities.  NEXT MD VISIT: unknown  OBJECTIVE:   DIAGNOSTIC FINDINGS: N/A  COGNITION: Overall cognitive status: Within functional limits for tasks assessed     SENSATION: WFL  EDEMA:  Patient denies swelling.  MUSCLE LENGTH: Hamstrings: Right 80 deg; Left 80 deg Thomas test: WNL B  POSTURE: rounded shoulders, decreased lumbar lordosis, decreased thoracic kyphosis, and dowager's hump  PALPATION: Mildly TTP at B HS origin, tension noted throughout BLE, although she is flexible. R heel is TTP at insertion of PF.  LOWER EXTREMITY ROM: WNL B   LOWER EXTREMITY MMT: 4/5 B hip ext, otherwise 5/5. Trunk instability noted.   LOWER EXTREMITY SPECIAL TESTS:  Back- SLR and slump test-both negative  GAIT: Distance walked: In clinic distances Assistive device utilized: None Level of assistance: Complete Independence Comments: No obvious gait deviations- patient reports that her pain is not currently present.   TODAY'S TREATMENT:                                                                                                                              DATE:  04/08/23 Bike L4 x 6 minutes STM to B piriformis and prox HS, L HS insertion and R prox HS seem to be the tightest and most painful. Performed stretch to HS after STM. HS curls, B 25#, 2 x 10, U 15#, 2 x 10, slow eccentric release. Knee Ext B 10#, 2 x 10. Squats to 16" target with 5#, 2 x 10 Dead Lifts to 16" target with 5# kettlebell, 2 x 10 Isometric hip abd against ball on wall, 2 x 10 reps each leg, 3-5 sec holds. Quadruped  arm reach, leg reach, bird dog x 3 each.  04/01/23: Manual:  prone for deep cross friction massage, theragun B over piriformis, hamstrings at proximal attachment B Deep pressure by PT over piriformis Supine for manual  stretching each hamstring, noted with ankle dorsiflexion unable to isolate hamstrings, stretch is located more in plantarflexors, therefore therapist provided overpressure for knee capsule B with ankle in neutral position , with better hamstrings isolation  Therx: Added door frame stretch for hamstrings which patient was able to perform capably.  Also reviewe LTR , bridging prior to getting up in AM to improve spine mobility Also standing in door frame for lateral glides pelvis, patient noted some L leg pain/ familiar pain with L lateral pelvic glides, which improved with repetition  03/29/23 NuStep L5 x 6 min S2S OHP yellow ball 2x10 Shoulder Ext 5lb 2x10 Standing row 10lb 2x10 Supine stretch to piriformis, HS, Clute, ITB LE on pball bridges, Oblq, K2C Double K2C stretch   03/25/23 NuStep L5 x 6 STM to B piriformis muscles followed by AA stretch Supine stretch to piriformis Supine strengthening-clamshells, then bridge with hip abd, 2 x 10 each Seated piriformis stretch, hip flexor stretch. Dumbell squats and dead lift, 4#, 10 reps each, min VC for form.  03/13/23: Therapeutic exercise: Initiated session with supine bike 7 min, level 2 for tissue perfusion At end of session following manual techniques, instructed in seated tailors stretch, alternating hips and alternating ER and IR stretch each hip  Manual:  Prone for theragun massage B post hamstring attachments, , proximal hamstring muscle belly Supine for manual stretching by PT for each hamstring, 30 sec holds, 4 reps each leg.    03/11/23: Therex:  Nustep L5, 6 min Assessed ant hip flexibility B, restricted B, more so on R Instructed in the ex as indicated below for medbridge app, to address ant hip flexibility  and lumbar motion in am  Manual:  side lying B for muscle energy to address Lumbar rotation, 5 sec holds, 6 to 10 reps each side Massage gun B to hamstring insertion SITS bones, and L piriformis   03/06/23 NuStep L5 x 6 minutes Supine Deep tissue self mobs with tennis ball for B piriformis Supine stretching, figure 4 with strap, glut stretch Supine strengthening, clamshells against G tband resistance, pelvic tilts with 5 sec hold, bridge with pelvic tilt, 10 each. Seated HS stretch Standing mini squats and hip abd against G tband resistance, 10 reps each  03/04/23 Education   PATIENT EDUCATION:  Education details: POC Person educated: Patient Education method: Explanation Education comprehension: verbalized understanding  HOME EXERCISE PROGRAM: Access Code: [email or web address] URL: [email or web address] Date: 03/11/2023 Prepared by: Amy Speaks  Exercises - Supine Lower Trunk Rotation  - 1 x daily - 7 x weekly - 3 sets - 10 reps - Seated Flexion Stretch  - 1 x daily - 7 x weekly - 3 sets - 10 reps - Prone SIJ Anterior Rotation Mobilization on Table  - 1 x daily - 7 x weekly - 3 sets - 10 reps  ASSESSMENT:  CLINICAL IMPRESSION: Patient reports improvement as long as she stretches, but pain can still be severe if she does not consistently stretch. She does have tightness in the sore areas, which are being addressed, but also reports pain going down the back of both legs when it is irritated. Emphasized Deep tissue mobs, stretch, and then strengthening and stabilization exercises for B LE and trunk.  OBJECTIVE IMPAIRMENTS: Abnormal gait, decreased activity tolerance, decreased balance, decreased coordination, difficulty walking, decreased ROM, decreased strength, obesity, and pain.   ACTIVITY LIMITATIONS: lifting, bending, sitting, squatting, sleeping, stairs, and locomotion level  PARTICIPATION LIMITATIONS: cleaning, laundry, shopping, and community activity  PERSONAL  FACTORS:  Past/current experiences are also affecting patient's functional outcome.   REHAB POTENTIAL: Good  CLINICAL DECISION MAKING: Stable/uncomplicated  EVALUATION COMPLEXITY: Low   GOALS: Goals reviewed with patient? Yes  SHORT TERM GOALS: Target date: 03/17/23 I with initial HEP Baseline: Goal status: 04/08/23-met  LONG TERM GOALS: Target date: 05/13/23  I final HEP Baseline:  Goal status: INITIAL  2.  Increase B hip strength to 5/5 Baseline:  Goal status: INITIAL  3.  Patient will increase activity tolerance to get through her full day with pain < 3/10 Baseline: 9/10 Goal status: 04/08/23-Up to 7/10, ongoing  4.  Patient will report resolution of her R heel pain with exercises and obtaining shoes offering arch support Baseline: Fluctuating R heel pain, worst in the morning. Goal status:04/08/23 met  PLAN:  PT FREQUENCY: 1-2x/week  PT DURATION: 10 weeks  PLANNED INTERVENTIONS: Therapeutic exercises, Therapeutic activity, Neuromuscular re-education, Balance training, Gait training, Patient/Family education, Self Care, Joint mobilization, Stair training, Dry Needling, Cryotherapy, Moist heat, Ionotophoresis 4mg /ml Dexamethasone, and Manual therapy  PLAN FOR NEXT SESSION: how was new hamstring stretch.  Oley Balm DPT 04/08/23 10:12 AM

## 2023-04-11 NOTE — Telephone Encounter (Signed)
Pharmacy Patient Advocate Encounter  Prior Authorization for [facility] has been APPROVED by [facility] from 6.17.24 to 10.17.24.

## 2023-04-12 NOTE — Telephone Encounter (Signed)
Mychart message sent to patient approved

## 2023-04-15 ENCOUNTER — Ambulatory Visit: Payer: 59

## 2023-04-15 ENCOUNTER — Other Ambulatory Visit: Payer: Self-pay

## 2023-04-15 DIAGNOSIS — R293 Abnormal posture: Secondary | ICD-10-CM | POA: Diagnosis not present

## 2023-04-15 DIAGNOSIS — M7918 Myalgia, other site: Secondary | ICD-10-CM

## 2023-04-15 DIAGNOSIS — M6281 Muscle weakness (generalized): Secondary | ICD-10-CM

## 2023-04-15 DIAGNOSIS — M79671 Pain in right foot: Secondary | ICD-10-CM

## 2023-04-15 NOTE — Therapy (Signed)
[facility] OUTPATIENT PHYSICAL THERAPY LOWER EXTREMITY TREATMENT   Patient Name: Rebekah [name] MRN: [id number] DOB:06/18/1961, 62 y.o., female Today's Date: 04/15/2023  END OF SESSION:  PT End of Session - 04/15/23 0929     Visit Number 9    Date for PT Re-Evaluation 05/13/23    PT Start Time 0929    PT Stop Time 1015    PT Time Calculation (min) 46 min               Past Medical History:  Diagnosis Date   Allergy    Arthritis    Back pain    Bowel obstruction (HCC) 2019   Depression    Gall bladder stones    GERD (gastroesophageal reflux disease)    Hyperlipidemia    Hypertension    IBS (irritable bowel syndrome)    IBS (irritable bowel syndrome)    Joint pain    Obesity    Lap band surgery 12-08 Dr Daphine Deutscher   OSA (obstructive sleep apnea)    using a CPAP     Pre-diabetes    REDUCTION MAMMOPLASTY, HX OF 09/22/2007   Qualifier: Diagnosis of  By: Drue Novel MD, Nolon Rod    Sleep apnea    doesn't use CPAP   Past Surgical History:  Procedure Laterality Date   APPENDECTOMY     BREAST REDUCTION SURGERY     CHOLECYSTECTOMY     CLOSED REDUCTION FINGER WITH PERCUTANEOUS PINNING Left 09/14/2014   Procedure: CLOSED REDUCTION PERCUTANEOUS PINNING ;  Surgeon: Betha Loa, MD;  Location: [facility];  Service: Orthopedics;  Laterality: Left;   COLONOSCOPY     ESOPHAGEAL MANOMETRY N/A 09/23/2017   Procedure: ESOPHAGEAL MANOMETRY (EM);  Surgeon: Napoleon Form, MD;  Location: [facility];  Service: Endoscopy;  Laterality: N/A;   Lap Band Adjustment  09/01/2015   Dr. Andrey Campanile   LAPAROSCOPIC GASTRIC BANDING  09/22/2007   Lap band surgery 12-08 Dr Daphine Deutscher   NASAL SINUS SURGERY     TONSILLECTOMY     x2   UPPER GASTROINTESTINAL ENDOSCOPY     Patient Active Problem List   Diagnosis Date Noted   Atypical glandular cells on cervical Pap smear 07/26/2022   Osteopenia 01/29/2022   HGSIL (high grade squamous intraepithelial lesion) on Pap smear of cervix 07/12/2021    Metabolic syndrome 01/10/2020   Morbid obesity (HCC) 01/10/2020   Migraines 11/13/2019   Hyperglycemia, unspecified 07/21/2018   SBO (small bowel obstruction) (HCC) 09/28/2017   Dysphagia    PCP NOTES >>>> 09/25/2015   Annual physical exam 09/05/2011   GERD 10/12/2009   Hyperlipidemia 09/22/2007   Anxiety and depression 09/22/2007   Essential hypertension 09/22/2007   Seasonal and perennial allergic rhinitis 09/22/2007   Obstructive sleep apnea 09/22/2007    PCP: Wanda Plump, MD  REFERRING PROVIDER: Wanda Plump, MD  REFERRING DIAG:  Diagnosis  M79.18 (ICD-10-CM) - Buttock pain    THERAPY DIAG:  Abnormal posture  Pain of right heel  Muscle weakness (generalized)  Bilateral buttock pain  Rationale for Evaluation and Treatment: Rehabilitation  ONSET DATE: 02/25/23  SUBJECTIVE:   SUBJECTIVE STATEMENT:  Patient reports that she is gradually better, much improved today , and after weekend. States that she has a better idea of how to deal with the pain when it happens, and the stretching seems to be key.    PERTINENT HISTORY: Prediabetes HTN Hyperlipidemia: lipitor aches (2018) Anxiety, depression (chronic sx, occ suicidal ideas, h/o self cutting). Used to  see Dr Donell Beers GERD Migraines  Obesity, lap band surgery 2008, adjustment 2016 OSA: not using CPAP as off 06/2020 Acne (doxy) ++FH CAD: F, M , B MI age 66, G-parents  Osteopenia: T score -2.3   09/2020, Rx Fosamax SBO 09-2017 PAIN:  Are you having pain? Yes: NPRS scale: 1/10 Pain location: B hips into legs Pain description: spasm like Aggravating factors: sitting, walking, first thing in the morning. Relieving factors: Stretching sometimes helps, movement  PRECAUTIONS: None  WEIGHT BEARING RESTRICTIONS: No  FALLS:  Has patient fallen in last 6 months? No  LIVING ENVIRONMENT: Lives with: lives with their family and lives with their spouse Lives in: House/apartment Stairs: Yes: Internal: 12 steps; on  right going up and External: 2 steps; none, severely painful Has following equipment at home: None  OCCUPATION: [employer], on her feet, part time. She also likes to garden.  PLOF: Independent  PATIENT GOALS: Decrease [pain, cope with the pain better to be able to return to her normal activities.  NEXT MD VISIT: unknown  OBJECTIVE:   DIAGNOSTIC FINDINGS: N/A  COGNITION: Overall cognitive status: Within functional limits for tasks assessed     SENSATION: WFL  EDEMA:  Patient denies swelling.  MUSCLE LENGTH: Hamstrings: Right 80 deg; Left 80 deg Thomas test: WNL B  POSTURE: rounded shoulders, decreased lumbar lordosis, decreased thoracic kyphosis, and dowager's hump  PALPATION: Mildly TTP at B HS origin, tension noted throughout BLE, although she is flexible. R heel is TTP at insertion of PF.  LOWER EXTREMITY ROM: WNL B   LOWER EXTREMITY MMT: 4/5 B hip ext, otherwise 5/5. Trunk instability noted.   LOWER EXTREMITY SPECIAL TESTS:  Back- SLR and slump test-both negative  GAIT: Distance walked: In clinic distances Assistive device utilized: None Level of assistance: Complete Independence Comments: No obvious gait deviations- patient reports that her pain is not currently present.   TODAY'S TREATMENT:                                                                                                                              DATE:  04/15/23:  Nustep 7 min Ue's and LE's Prone for cross friction massage and theragun massage B hamstrings over proximal insertion and superior 1/3 muscle belly Supine for hamstring stretch , alternating, with strap for eccentric contraction and stretch.  4 x each side, prolonged stretch 10 sec Supine for manual stretching each hamstring, 60 sec holds, each leg with overpressure by PT , 3 x each leg HS curls, B 25#, 2 x 10, U 15# Knee Ext B 10#, 2 x 10. 04/08/23 Bike L4 x 6 minutes STM to B piriformis and prox HS, L HS insertion and R  prox HS seem to be the tightest and most painful. Performed stretch to HS after STM. HS curls, B 25#, 2 x 10, U 15#, 2 x 10, slow eccentric release. Knee Ext B 10#, 2 x 10. Squats to 16" target with  5#, 2 x 10 Dead Lifts to 16" target with 5# kettlebell, 2 x 10 Isometric hip abd against ball on wall, 2 x 10 reps each leg, 3-5 sec holds. Quadruped arm reach, leg reach, bird dog x 3 each.  04/01/23: Manual:  prone for deep cross friction massage, theragun B over piriformis, hamstrings at proximal attachment B Deep pressure by PT over piriformis Supine for manual stretching each hamstring, noted with ankle dorsiflexion unable to isolate hamstrings, stretch is located more in plantarflexors, therefore therapist provided overpressure for knee capsule B with ankle in neutral position , with better hamstrings isolation  Therx: Added door frame stretch for hamstrings which patient was able to perform capably.  Also reviewe LTR , bridging prior to getting up in AM to improve spine mobility Also standing in door frame for lateral glides pelvis, patient noted some L leg pain/ familiar pain with L lateral pelvic glides, which improved with repetition  03/29/23 NuStep L5 x 6 min S2S OHP yellow ball 2x10 Shoulder Ext 5lb 2x10 Standing row 10lb 2x10 Supine stretch to piriformis, HS, Clute, ITB LE on pball bridges, Oblq, K2C Double K2C stretch   03/25/23 NuStep L5 x 6 STM to B piriformis muscles followed by AA stretch Supine stretch to piriformis Supine strengthening-clamshells, then bridge with hip abd, 2 x 10 each Seated piriformis stretch, hip flexor stretch. Dumbell squats and dead lift, 4#, 10 reps each, min VC for form.  03/13/23: Therapeutic exercise: Initiated session with supine bike 7 min, level 2 for tissue perfusion At end of session following manual techniques, instructed in seated tailors stretch, alternating hips and alternating ER and IR stretch each hip  Manual:  Prone for  theragun massage B post hamstring attachments, , proximal hamstring muscle belly Supine for manual stretching by PT for each hamstring, 30 sec holds, 4 reps each leg.    03/11/23: Therex:  Nustep L5, 6 min Assessed ant hip flexibility B, restricted B, more so on R Instructed in the ex as indicated below for medbridge app, to address ant hip flexibility and lumbar motion in am  Manual:  side lying B for muscle energy to address Lumbar rotation, 5 sec holds, 6 to 10 reps each side Massage gun B to hamstring insertion SITS bones, and L piriformis   03/06/23 NuStep L5 x 6 minutes Supine Deep tissue self mobs with tennis ball for B piriformis Supine stretching, figure 4 with strap, glut stretch Supine strengthening, clamshells against G tband resistance, pelvic tilts with 5 sec hold, bridge with pelvic tilt, 10 each. Seated HS stretch Standing mini squats and hip abd against G tband resistance, 10 reps each  03/04/23 Education   PATIENT EDUCATION:  Education details: POC Person educated: Patient Education method: Explanation Education comprehension: verbalized understanding  HOME EXERCISE PROGRAM: Access Code: [email or web address] URL: [email or web address] Date: 03/11/2023 Prepared by: Rowland Ericsson  Exercises - Supine Lower Trunk Rotation  - 1 x daily - 7 x weekly - 3 sets - 10 reps - Seated Flexion Stretch  - 1 x daily - 7 x weekly - 3 sets - 10 reps - Prone SIJ Anterior Rotation Mobilization on Table  - 1 x daily - 7 x weekly - 3 sets - 10 reps  ASSESSMENT:  CLINICAL IMPRESSION: Patient reports improvement as long as she stretches.  After today's Rx determined mutually to place the patient on 30 day hold, she needed to cancel next week's appts anyway.  She is going to continue with the exercises and pain  management techniques and will call within 30 days if needed, has increased Sx.   OBJECTIVE IMPAIRMENTS: Abnormal gait, decreased activity tolerance, decreased balance,  decreased coordination, difficulty walking, decreased ROM, decreased strength, obesity, and pain.   ACTIVITY LIMITATIONS: lifting, bending, sitting, squatting, sleeping, stairs, and locomotion level  PARTICIPATION LIMITATIONS: cleaning, laundry, shopping, and community activity  PERSONAL FACTORS: Past/current experiences are also affecting patient's functional outcome.   REHAB POTENTIAL: Good  CLINICAL DECISION MAKING: Stable/uncomplicated  EVALUATION COMPLEXITY: Low   GOALS: Goals reviewed with patient? Yes  SHORT TERM GOALS: Target date: 03/17/23 I with initial HEP Baseline: Goal status: 04/08/23-met  LONG TERM GOALS: Target date: 05/13/23  I final HEP Baseline:  Goal status: INITIAL  2.  Increase B hip strength to 5/5 Baseline:  Goal status: INITIAL  3.  Patient will increase activity tolerance to get through her full day with pain < 3/10 Baseline: 9/10 Goal status: 04/08/23-Up to 7/10, ongoing  04/15/23:  improved overall, 2 to 4/10 at worst now  4.  Patient will report resolution of her R heel pain with exercises and obtaining shoes offering arch support Baseline: Fluctuating R heel pain, worst in the morning. Goal status:04/08/23 met  PLAN:  PT FREQUENCY: 1-2x/week  PT DURATION: 10 weeks  PLANNED INTERVENTIONS: Therapeutic exercises, Therapeutic activity, Neuromuscular re-education, Balance training, Gait training, Patient/Family education, Self Care, Joint mobilization, Stair training, Dry Needling, Cryotherapy, Moist heat, Ionotophoresis 4mg /ml Dexamethasone, and Manual therapy  PLAN FOR NEXT SESSION: hold PT at this time patient to call if further Rx needed.  Caralee Ates, PT, DPT 04/15/23 12:18 PM

## 2023-04-17 ENCOUNTER — Ambulatory Visit (INDEPENDENT_AMBULATORY_CARE_PROVIDER_SITE_OTHER): Payer: 59

## 2023-04-17 ENCOUNTER — Other Ambulatory Visit: Payer: Self-pay | Admitting: Internal Medicine

## 2023-04-17 ENCOUNTER — Ambulatory Visit: Payer: 59

## 2023-04-17 DIAGNOSIS — Z6833 Body mass index (BMI) 33.0-33.9, adult: Secondary | ICD-10-CM | POA: Diagnosis not present

## 2023-04-17 DIAGNOSIS — E6609 Other obesity due to excess calories: Secondary | ICD-10-CM

## 2023-04-17 MED ORDER — WEGOVY 0.25 MG/0.5ML ~~LOC~~ SOAJ: 0.2500 mg | SUBCUTANEOUS | 0 refills | Status: DC

## 2023-04-17 NOTE — Progress Notes (Signed)
   Nurse Visit   Date of Encounter: 04/17/2023 ID: LEYDI WINSTEAD, DOB 1960/11/13, MRN [id number]  PCP:  Wanda Plump, MD   [facility] Providers Cardiologist:  Jodelle Red, MD      Visit Details   VS:  There were no vitals taken for this visit. , BMI There is no height or weight on file to calculate BMI.  Wt Readings from Last 3 Encounters:  02/25/23 195 lb 6 oz (88.6 kg)  02/05/23 195 lb (88.5 kg)  10/23/22 194 lb 12.8 oz (88.4 kg)     Reason for visit: Wegovy first injection  Performed today: Education Changes (medications, testing, etc.) : none made  Length of Visit: 10 minutes    Medications Adjustments/Labs and Tests Ordered: No orders of the defined types were placed in this encounter.  Meds ordered this encounter  Medications   Semaglutide-Weight Management (WEGOVY) 0.25 MG/0.5ML SOAJ    Sig: Inject 0.25 mg into the skin once a week. FOR 4 WEEKS    Dispense:  2 mL    Refill:  0    Order Specific Question:   Lot Number?    Answer:   [id number]    Order Specific Question:   Expiration Date?    Answer:   08/22/2023     Signed, Marlene Lard, RN  04/17/2023 12:52 PM

## 2023-04-17 NOTE — Patient Instructions (Signed)
Medication Instructions:  Your physician recommends that you continue on your current medications as directed. Please refer to the Current Medication list given to you today.  *If you need a refill on your cardiac medications before your next appointment, please call your pharmacy*  Follow-Up: At [facility], you and your health needs are our priority.  As part of our continuing mission to provide you with exceptional heart care, we have created designated Provider Care Teams.  These Care Teams include your primary Cardiologist (physician) and Advanced Practice Providers (APPs -  Physician Assistants and Nurse Practitioners) who all work together to provide you with the care you need, when you need it.  We recommend signing up for the patient portal called "MyChart".  Sign up information is provided on this After Visit Summary.  MyChart is used to connect with patients for Virtual Visits (Telemedicine).  Patients are able to view lab/test results, encounter notes, upcoming appointments, etc.  Non-urgent messages can be sent to your provider as well.   To learn more about what you can do with MyChart, go to [email or web address].    Your next appointment:   April 2025 with Dr. Cristal Deer

## 2023-04-22 ENCOUNTER — Ambulatory Visit: Payer: 59

## 2023-04-29 ENCOUNTER — Other Ambulatory Visit (HOSPITAL_BASED_OUTPATIENT_CLINIC_OR_DEPARTMENT_OTHER): Payer: Self-pay | Admitting: Cardiology

## 2023-04-29 MED ORDER — SEMAGLUTIDE-WEIGHT MANAGEMENT 1 MG/0.5ML ~~LOC~~ SOAJ: 1.0000 mg | SUBCUTANEOUS | 0 refills | Status: DC

## 2023-04-29 NOTE — Addendum Note (Signed)
Addended by: Marlene Lard on: 04/29/2023 09:32 AM   Modules accepted: Orders

## 2023-05-02 ENCOUNTER — Other Ambulatory Visit: Payer: Self-pay

## 2023-05-02 ENCOUNTER — Emergency Department (HOSPITAL_COMMUNITY): Payer: 59

## 2023-05-02 ENCOUNTER — Encounter (HOSPITAL_COMMUNITY): Payer: Self-pay | Admitting: Emergency Medicine

## 2023-05-02 ENCOUNTER — Emergency Department (HOSPITAL_COMMUNITY)
Admission: EM | Admit: 2023-05-02 | Discharge: 2023-05-03 | Disposition: A | Discharge status: Home / Self Care | Payer: 59 | Attending: Emergency Medicine | Admitting: Emergency Medicine

## 2023-05-02 DIAGNOSIS — R110 Nausea: Secondary | ICD-10-CM | POA: Diagnosis not present

## 2023-05-02 DIAGNOSIS — K219 Gastro-esophageal reflux disease without esophagitis: Secondary | ICD-10-CM | POA: Insufficient documentation

## 2023-05-02 DIAGNOSIS — I10 Essential (primary) hypertension: Secondary | ICD-10-CM | POA: Diagnosis not present

## 2023-05-02 DIAGNOSIS — Z794 Long term (current) use of insulin: Secondary | ICD-10-CM | POA: Insufficient documentation

## 2023-05-02 DIAGNOSIS — R112 Nausea with vomiting, unspecified: Secondary | ICD-10-CM

## 2023-05-02 DIAGNOSIS — Z79899 Other long term (current) drug therapy: Secondary | ICD-10-CM | POA: Diagnosis not present

## 2023-05-02 DIAGNOSIS — R1084 Generalized abdominal pain: Secondary | ICD-10-CM | POA: Insufficient documentation

## 2023-05-02 DIAGNOSIS — Z7982 Long term (current) use of aspirin: Secondary | ICD-10-CM | POA: Insufficient documentation

## 2023-05-02 DIAGNOSIS — R61 Generalized hyperhidrosis: Secondary | ICD-10-CM | POA: Diagnosis not present

## 2023-05-02 DIAGNOSIS — R109 Unspecified abdominal pain: Secondary | ICD-10-CM | POA: Diagnosis present

## 2023-05-02 LAB — I-STAT CHEM 8, ED
BUN: 15 mg/dL (ref 6–20)
Calcium, Ion: 1.15 mmol/L (ref 1.15–1.40)
Chloride: 106 mmol/L (ref 98–111)
Creatinine, Ser: 0.9 mg/dL (ref 0.44–1.00)
Glucose, Bld: 107 mg/dL — ABNORMAL HIGH (ref 70–99)
HCT: 44 % (ref 36.0–46.0)
Hemoglobin: 15 g/dL (ref 12.0–15.0)
Potassium: 4 mmol/L (ref 3.5–5.1)
Sodium: 141 mmol/L (ref 135–145)
TCO2: 29 mmol/L (ref 22–32)

## 2023-05-02 LAB — CBC WITH DIFFERENTIAL/PLATELET
Abs Immature Granulocytes: 0.05 10*3/uL (ref 0.00–0.07)
Basophils Absolute: 0 10*3/uL (ref 0.0–0.1)
Basophils Relative: 0 %
Eosinophils Absolute: 0.2 10*3/uL (ref 0.0–0.5)
Eosinophils Relative: 1 %
HCT: 48.5 % — ABNORMAL HIGH (ref 36.0–46.0)
Hemoglobin: 15.5 g/dL — ABNORMAL HIGH (ref 12.0–15.0)
Immature Granulocytes: 0 %
Lymphocytes Relative: 13 %
Lymphs Abs: 2.1 10*3/uL (ref 0.7–4.0)
MCH: 27.3 pg (ref 26.0–34.0)
MCHC: 32 g/dL (ref 30.0–36.0)
MCV: 85.4 fL (ref 80.0–100.0)
Monocytes Absolute: 0.5 10*3/uL (ref 0.1–1.0)
Monocytes Relative: 3 %
Neutro Abs: 12.9 10*3/uL — ABNORMAL HIGH (ref 1.7–7.7)
Neutrophils Relative %: 83 %
Platelets: 337 10*3/uL (ref 150–400)
RBC: 5.68 MIL/uL — ABNORMAL HIGH (ref 3.87–5.11)
RDW: 17.3 % — ABNORMAL HIGH (ref 11.5–15.5)
WBC: 15.8 10*3/uL — ABNORMAL HIGH (ref 4.0–10.5)
nRBC: 0 % (ref 0.0–0.2)

## 2023-05-02 LAB — COMPREHENSIVE METABOLIC PANEL
ALT: 35 U/L (ref 0–44)
AST: 36 U/L (ref 15–41)
Albumin: 4 g/dL (ref 3.5–5.0)
Alkaline Phosphatase: 60 U/L (ref 38–126)
Anion gap: 7 (ref 5–15)
BUN: 15 mg/dL (ref 6–20)
CO2: 25 mmol/L (ref 22–32)
Calcium: 9.1 mg/dL (ref 8.9–10.3)
Chloride: 107 mmol/L (ref 98–111)
Creatinine, Ser: 0.98 mg/dL (ref 0.44–1.00)
GFR, Estimated: >60 mL/min (ref >60)
Glucose, Bld: 108 mg/dL — ABNORMAL HIGH (ref 70–99)
Potassium: 3.8 mmol/L (ref 3.5–5.1)
Sodium: 139 mmol/L (ref 135–145)
Total Bilirubin: 0.4 mg/dL (ref 0.3–1.2)
Total Protein: 7.9 g/dL (ref 6.5–8.1)

## 2023-05-02 LAB — URINALYSIS, ROUTINE W REFLEX MICROSCOPIC
Bacteria, UA: NONE SEEN
Bilirubin Urine: NEGATIVE
Glucose, UA: >=500 mg/dL — AB
Hgb urine dipstick: NEGATIVE
Ketones, ur: 20 mg/dL — AB
Nitrite: NEGATIVE
Protein, ur: NEGATIVE mg/dL
Specific Gravity, Urine: 1.03 (ref 1.005–1.030)
pH: 6 (ref 5.0–8.0)

## 2023-05-02 LAB — LIPASE, BLOOD: Lipase: 36 U/L (ref 11–51)

## 2023-05-02 MED ORDER — SODIUM CHLORIDE 0.9 % IV BOLUS
1000.0000 mL | Freq: Once | INTRAVENOUS | Status: AC
  Administered 2023-05-02: 1000 mL via INTRAVENOUS

## 2023-05-02 MED ORDER — IOHEXOL 300 MG/ML  SOLN
100.0000 mL | Freq: Once | INTRAMUSCULAR | Status: AC | PRN
  Administered 2023-05-02: 100 mL via INTRAVENOUS

## 2023-05-02 MED ORDER — SODIUM CHLORIDE 0.9 % IV SOLN: INTRAVENOUS | Status: DC

## 2023-05-02 NOTE — ED Provider Notes (Signed)
[facility] EMERGENCY DEPARTMENT AT [facility] Provider Note   CSN: [id number] Arrival date & time: 05/02/23  2027     History  Chief Complaint  Patient presents with   Abdominal Pain    Rebekah [name] is a 62 y.o. female.   Abdominal Pain    Patient has a history of reflux hypertension hyperlipidemia obstructive sleep apnea, depression, irritable bowel syndrome, bowel obstruction.  Patient has had prior surgery including laparoscopic gastric banding, cholecystectomy appendectomy.  Patient states she started having trouble with nausea abdominal pain today.  Patient states she started having pain around 2 PM today.  Mostly in left upper part of her abdomen.  She started becoming very nauseated.  She did not vomit but then became very pale and diaphoretic.  EMS was called.  Patient was given fentanyl and Zofran with improvement of her symptoms.  Patient has had a bowel obstruction in the past and this does feel similar.  Home Medications Prior to Admission medications   Medication Sig Start Date End Date Taking? Authorizing Provider  alendronate (FOSAMAX) 70 MG tablet Take 1 tablet (70 mg total) by mouth every 7 (seven) days. Take with a full glass of water on an empty stomach. 01/31/22   Wanda Plump, MD  aspirin EC 81 MG tablet Take 81 mg by mouth daily. Swallow whole.    [provider]  ELDERBERRY PO  03/22/20   [provider]  empagliflozin (JARDIANCE) 10 MG TABS tablet Take 1 tablet (10 mg total) by mouth daily before breakfast. 03/13/23   Wanda Plump, MD  Estradiol (DIVIGEL) 1 MG/GM GEL Place 1 mg onto the skin daily.    [provider]  hydrochlorothiazide (MICROZIDE) 12.5 MG capsule Take 1 capsule (12.5 mg total) by mouth daily. 11/15/22   Wanda Plump, MD  L-Methylfolate-Algae (DEPLIN 7.5) 7.5-90.314 MG CAPS Take 1 tablet by mouth daily. 04/17/23   Wanda Plump, MD  Multiple Vitamin (MULTIVITAMIN) tablet Take 1 tablet by mouth daily. Patient not  taking: Reported on 02/25/2023 09/04/13   Wanda Plump, MD  nortriptyline (PAMELOR) 75 MG capsule TAKE 1 CAPSULE BY MOUTH DAILY 12/13/22   Wanda Plump, MD  omeprazole (PRILOSEC) 40 MG capsule Take 1 capsule (40 mg total) by mouth daily. 08/09/22   Wanda Plump, MD  ondansetron (ZOFRAN) 8 MG tablet Take 1 tablet (8 mg total) by mouth every 8 (eight) hours as needed for nausea or vomiting. 01/23/22   Wanda Plump, MD  Probiotic Product (PROBIOTIC-10) CHEW Chew 1 tablet by mouth daily.     [provider]  progesterone (PROMETRIUM) 100 MG capsule Take 100 mg by mouth daily. 08/30/16   [provider]  rosuvastatin (CRESTOR) 10 MG tablet Take 1 tablet (10 mg total) by mouth daily. 02/28/23   Wanda Plump, MD  Semaglutide-Weight Management [facility]) 0.25 MG/0.5ML SOAJ Inject 0.25 mg into the skin once a week. FOR 4 WEEKS 04/17/23   Jodelle Red, MD  Semaglutide-Weight Management 0.5 MG/0.5ML SOAJ Inject 0.5 mg into the skin once a week for 28 days. 05/03/23 05/31/23  Jodelle Red, MD  Semaglutide-Weight Management 1 MG/0.5ML SOAJ Inject 1 mg into the skin once a week for 28 days. 06/01/23 06/29/23  Jodelle Red, MD  Semaglutide-Weight Management 1.7 MG/0.75ML SOAJ Inject 1.7 mg into the skin once a week for 28 days. 06/30/23 07/28/23  Jodelle Red, MD  Semaglutide-Weight Management 2.4 MG/0.75ML SOAJ Inject 2.4 mg into the skin once  a week for 28 days. 07/29/23 08/26/23  Jodelle Red, MD      Allergies    Penicillins, Metformin and related, and Erythromycin    Review of Systems   Review of Systems  Gastrointestinal:  Positive for abdominal pain.    Physical Exam Updated Vital Signs BP 127/78   Pulse 90   Temp 97.9 F (36.6 C) (Oral)   Resp 20   Ht 1.626 m (5\' 4" )   Wt 88.6 kg   SpO2 93%   BMI 33.54 kg/m  Physical Exam Vitals and nursing note reviewed.  Constitutional:      General: She is not in acute distress.    Appearance: She is  well-developed.  HENT:     Head: Normocephalic and atraumatic.     Right Ear: External ear normal.     Left Ear: External ear normal.  Eyes:     General: No scleral icterus.       Right eye: No discharge.        Left eye: No discharge.     Conjunctiva/sclera: Conjunctivae normal.  Neck:     Trachea: No tracheal deviation.  Cardiovascular:     Rate and Rhythm: Normal rate and regular rhythm.  Pulmonary:     Effort: Pulmonary effort is normal. No respiratory distress.     Breath sounds: Normal breath sounds. No stridor. No wheezing or rales.  Abdominal:     General: Bowel sounds are normal. There is no distension.     Palpations: Abdomen is soft.     Tenderness: There is abdominal tenderness in the left upper quadrant. There is no guarding or rebound.     Comments: No hernia appreciated, Lap-Band port palpable right epigastric region  Musculoskeletal:        General: No tenderness or deformity.     Cervical back: Neck supple.  Skin:    General: Skin is warm and dry.     Findings: No rash.  Neurological:     General: No focal deficit present.     Mental Status: She is alert.     Cranial Nerves: No cranial nerve deficit, dysarthria or facial asymmetry.     Sensory: No sensory deficit.     Motor: No abnormal muscle tone or seizure activity.     Coordination: Coordination normal.  Psychiatric:        Mood and Affect: Mood normal.     ED Results / Procedures / Treatments   Labs (all labs ordered are listed, but only abnormal results are displayed) Labs Reviewed  COMPREHENSIVE METABOLIC PANEL  LIPASE, BLOOD  CBC WITH DIFFERENTIAL/PLATELET  URINALYSIS, ROUTINE W REFLEX MICROSCOPIC  I-STAT CHEM 8, ED    EKG None  Radiology No results found.  Procedures Procedures    Medications Ordered in ED Medications  sodium chloride 0.9 % bolus 1,000 mL (has no administration in time range)    And  0.9 %  sodium chloride infusion (has no administration in time range)     ED Course/ Medical Decision Making/ A&P Clinical Course as of 05/03/23 1603  Fri May 03, 2023  1603 CBC with Diff(!) Leukocytosis noted. [JK]  1603 Urinalysis, Routine w reflex microscopic -Urine, Clean Catch(!) Increased white blood cells noted but patient's not have any urinary symptoms [JK]  1603 Comprehensive metabolic panel(!) Normal [JK]    Clinical Course User Index [JK] Linwood Dibbles, MD  Medical Decision Making Problems Addressed: Generalized abdominal pain: acute illness or injury that poses a threat to life or bodily functions  Amount and/or Complexity of Data Reviewed Labs: ordered. Decision-making details documented in ED Course. Radiology: ordered.  Risk Prescription drug management. Parenteral controlled substances.   Pt presented with abd pain, hx of lap band.  Multiple prior abd surgeries.  Concern for possible diverticulitis, SBO.  Sx improved with treatment in the ED.  CT scan ordered at shift change.  Care turned over to Dr Karene Fry.        Final Clinical Impression(s) / ED Diagnoses Final diagnoses:  None    Rx / DC Orders ED Discharge Orders     None         Linwood Dibbles, MD 05/03/23 [phone]

## 2023-05-02 NOTE — ED Provider Notes (Signed)
  Physical Exam  BP (!) 151/119   Pulse 87   Temp 97.9 F (36.6 C) (Oral)   Resp 20   Ht 5\' 4"  (1.626 m)   Wt 88.6 kg   SpO2 97%   BMI 33.54 kg/m   Physical Exam  Procedures  Procedures  ED Course / MDM    Medical Decision Making Amount and/or Complexity of Data Reviewed Labs: ordered. Radiology: ordered.  Risk Prescription drug management.   3F presenting with abd pain, feeling better after Fentanyl, pending CT and likely DC after reassessment.   CT Abd Pelvis: IMPRESSION:  No acute findings in the abdomen or pelvis.    Fatty infiltration of the liver.    Left band device in expected position.    Aortic atherosclerosis.    Patient reassessed, feeling symptomatically improved with a reassuring abdominal exam, tolerating oral intake.  Abdomen overall soft, minimal generalized tenderness on exam.  Will discharge with Zofran for nausea.  Patient endorses dry heaves, generalized abdominal discomfort, loose stools.  Symptoms consistent with likely developing gastroenteritis.  Tolerating oral intake, advised continued oral hydration.  Stable for discharge and outpatient follow-up.    Ernie Avena, MD 05/03/23 [phone]

## 2023-05-02 NOTE — ED Triage Notes (Addendum)
PT from home with ABD pain starting at 2pm. LUQ abdominal pain. Gallbladder has been removed. N, V, D and getting worse. EMS arrived and she was pale and diaphoretic. EKG ok. Hx of bowel obstruction 12 years ago and states felt like this. CBG 127.  EMS gave 100 mcg fentanyl and 4 mg zofran and pt's pain is greatly improved.

## 2023-05-03 MED ORDER — ONDANSETRON 4 MG PO TBDP: 4.0000 mg | ORAL_TABLET | Freq: Three times a day (TID) | ORAL | 0 refills | Status: DC | PRN

## 2023-05-03 MED ORDER — HYDROCODONE-ACETAMINOPHEN 5-325 MG PO TABS: 1.0000 | ORAL_TABLET | Freq: Once | ORAL | Status: DC

## 2023-05-03 NOTE — Discharge Instructions (Addendum)
Your CT was reassuring as were your labs. Continue to push oral fluids, take Zofran for nausea.   CT Results: IMPRESSION:  No acute findings in the abdomen or pelvis.    Fatty infiltration of the liver.    Left band device in expected position.    Aortic atherosclerosis.

## 2023-05-08 ENCOUNTER — Other Ambulatory Visit: Payer: Self-pay | Admitting: Internal Medicine

## 2023-05-08 DIAGNOSIS — I10 Essential (primary) hypertension: Secondary | ICD-10-CM

## 2023-05-30 ENCOUNTER — Other Ambulatory Visit (HOSPITAL_BASED_OUTPATIENT_CLINIC_OR_DEPARTMENT_OTHER): Payer: Self-pay | Admitting: Cardiology

## 2023-05-31 NOTE — Telephone Encounter (Signed)
Please assist with refill.  Thank you. 

## 2023-05-31 NOTE — Telephone Encounter (Signed)
Med available at [facility].  Contacted patient and Rebekah [name]

## 2023-06-01 ENCOUNTER — Other Ambulatory Visit: Payer: Self-pay | Admitting: Internal Medicine

## 2023-06-05 ENCOUNTER — Other Ambulatory Visit (HOSPITAL_BASED_OUTPATIENT_CLINIC_OR_DEPARTMENT_OTHER): Payer: Self-pay

## 2023-06-05 ENCOUNTER — Telehealth (HOSPITAL_BASED_OUTPATIENT_CLINIC_OR_DEPARTMENT_OTHER): Payer: Self-pay | Admitting: Cardiology

## 2023-06-05 DIAGNOSIS — E6609 Other obesity due to excess calories: Secondary | ICD-10-CM

## 2023-06-05 MED ORDER — WEGOVY 1 MG/0.5ML ~~LOC~~ SOAJ
1.0000 mg | SUBCUTANEOUS | 0 refills | Status: DC
Start: 2023-06-05 — End: 2023-07-31
  Filled 2023-06-05 (×2): qty 2, 28d supply, fill #0

## 2023-06-05 NOTE — Telephone Encounter (Signed)
Contacted patient again and left message

## 2023-06-05 NOTE — Addendum Note (Signed)
Addended by: Cheree Ditto on: 06/05/2023 03:54 PM   Modules accepted: Orders

## 2023-06-05 NOTE — Telephone Encounter (Signed)
Pt c/o medication issue:  1. Name of Medication:   Semaglutide-Weight Management (WEGOVY) 0.25 MG/0.5ML SOAJ    2. How are you currently taking this medication (dosage and times per day)? Inject 0.25 mg into the skin once a week. FOR 4 WEEKS   3. Are you having a reaction (difficulty breathing--STAT)? No   4. What is your medication issue? Pt requesting to speak with Thayer Ohm regarding her wegovy

## 2023-06-07 ENCOUNTER — Other Ambulatory Visit (HOSPITAL_BASED_OUTPATIENT_CLINIC_OR_DEPARTMENT_OTHER): Payer: Self-pay

## 2023-06-07 MED ORDER — SEMAGLUTIDE-WEIGHT MANAGEMENT 1.7 MG/0.75ML ~~LOC~~ SOAJ
1.7000 mg | SUBCUTANEOUS | 0 refills | Status: DC
Start: 2023-06-30 — End: 2023-07-02
  Filled 2023-06-07: qty 3, 28d supply, fill #0

## 2023-06-07 NOTE — Addendum Note (Signed)
Addended by: Cheree Ditto on: 06/07/2023 07:45 AM   Modules accepted: Orders

## 2023-07-01 ENCOUNTER — Other Ambulatory Visit (HOSPITAL_BASED_OUTPATIENT_CLINIC_OR_DEPARTMENT_OTHER): Payer: Self-pay

## 2023-07-01 ENCOUNTER — Other Ambulatory Visit (HOSPITAL_BASED_OUTPATIENT_CLINIC_OR_DEPARTMENT_OTHER): Payer: Self-pay | Admitting: Cardiology

## 2023-07-01 DIAGNOSIS — E6609 Other obesity due to excess calories: Secondary | ICD-10-CM

## 2023-07-01 LAB — HM MAMMOGRAPHY

## 2023-07-02 ENCOUNTER — Other Ambulatory Visit (HOSPITAL_BASED_OUTPATIENT_CLINIC_OR_DEPARTMENT_OTHER): Payer: Self-pay

## 2023-07-02 MED ORDER — SEMAGLUTIDE-WEIGHT MANAGEMENT 1.7 MG/0.75ML ~~LOC~~ SOAJ
1.7000 mg | SUBCUTANEOUS | 0 refills | Status: DC
Start: 2023-07-02 — End: 2023-07-31
  Filled 2023-07-02: qty 3, 28d supply, fill #0

## 2023-07-02 NOTE — Addendum Note (Signed)
Addended by: Cheree Ditto on: 07/02/2023 09:10 AM   Modules accepted: Orders

## 2023-07-02 NOTE — Telephone Encounter (Deleted)
Understood. What strength do you need me to send in?

## 2023-07-10 ENCOUNTER — Encounter: Payer: Self-pay | Admitting: Family

## 2023-07-10 ENCOUNTER — Other Ambulatory Visit (INDEPENDENT_AMBULATORY_CARE_PROVIDER_SITE_OTHER): Payer: 59

## 2023-07-10 ENCOUNTER — Ambulatory Visit: Payer: 59 | Admitting: Family

## 2023-07-10 DIAGNOSIS — M25562 Pain in left knee: Secondary | ICD-10-CM

## 2023-07-10 MED ORDER — LIDOCAINE HCL 1 % IJ SOLN
5.0000 mL | INTRAMUSCULAR | Status: AC | PRN
Start: 2023-07-10 — End: 2023-07-10
  Administered 2023-07-10: 5 mL

## 2023-07-10 MED ORDER — METHYLPREDNISOLONE ACETATE 40 MG/ML IJ SUSP
40.0000 mg | INTRAMUSCULAR | Status: AC | PRN
Start: 2023-07-10 — End: 2023-07-10
  Administered 2023-07-10: 40 mg via INTRA_ARTICULAR

## 2023-07-10 NOTE — Progress Notes (Signed)
Office Visit Note   Patient: Rebekah [name]           Date of Birth: 04-10-1961           MRN: [id number] Visit Date: 07/10/2023              Requested by: Wanda Plump, MD [address] [place],  [place] [address] PCP: Wanda Plump, MD  Chief Complaint  Patient presents with   Left Knee - Pain      HPI: The patient is a 62 year old woman who is seen for evaluation left knee pain which has been ongoing for 6 weeks this came on suddenly she has not previously had issues with her left knee now she has been having deep medial knee pain associated with some mild swelling pain with ascending and descending stairs she is having to do them 1 at a time.  She complains of loss of range of motion pain with flexion difficulty getting to sleep due to knee pain no locking or catching no giving way  Assessment & Plan: Visit Diagnoses:  1. Acute pain of left knee     Plan: Depo-Medrol injection left knee.  Patient tolerated well.  Follow-Up Instructions: No follow-ups on file.   Left Knee Exam   Tenderness  The patient is experiencing tenderness in the medial joint line.  Range of Motion  The patient has normal left knee ROM.  Tests  Varus: negative Valgus: negative  Other  Effusion: no effusion present      Patient is alert, oriented, no adenopathy, well-dressed, normal affect, normal respiratory effort.   Imaging: No results found. No images are attached to the encounter.  Labs: Lab Results  Component Value Date   HGBA1C 6.4 02/25/2023   HGBA1C 6.2 10/23/2022   HGBA1C 6.4 07/27/2022   ESRSEDRATE 18 06/14/2016   ESRSEDRATE 31 (H) 06/20/2011   LABORGA Insignificant Growth 07/04/2012     Lab Results  Component Value Date   ALBUMIN 4.0 05/02/2023   ALBUMIN 4.5 07/27/2022   ALBUMIN 4.3 01/29/2022    No results found for: "MG" Lab Results  Component Value Date   VD25OH 54.3 10/25/2021   VD25OH 61.8 03/08/2021   VD25OH 44.0 09/29/2020    No  results found for: "PREALBUMIN"    Latest Ref Rng & Units 05/02/2023   11:35 PM 05/02/2023    8:48 PM 02/25/2023    9:18 AM  CBC EXTENDED  WBC 4.0 - 10.5 K/uL  15.8  11.6   RBC 3.87 - 5.11 MIL/uL  5.68  5.15   Hemoglobin 12.0 - 15.0 g/dL 09.3  23.5  57.3   HCT 36.0 - 46.0 % 44.0  48.5  42.4   Platelets 150 - 400 K/uL  337  313.0   NEUT# 1.7 - 7.7 K/uL  12.9  6.9   Lymph# 0.7 - 4.0 K/uL  2.1  3.1      There is no height or weight on file to calculate BMI.  Orders:  Orders Placed This Encounter  Procedures   XR Knee 1-2 Views Left   No orders of the defined types were placed in this encounter.    Procedures: Large Joint Inj: L knee on 07/10/2023 3:36 PM Indications: pain Details: 18 G 1.5 in needle, anteromedial approach Medications: 5 mL lidocaine 1 %; 40 mg methylPREDNISolone acetate 40 MG/ML Consent was given by the patient.      Clinical Data: No additional findings.  ROS:  All other systems negative, except as noted in the HPI. Review of Systems  Objective: Vital Signs: There were no vitals taken for this visit.  Specialty Comments:  No specialty comments available.  PMFS History: Patient Active Problem List   Diagnosis Date Noted   Atypical glandular cells on cervical Pap smear 07/26/2022   Osteopenia 01/29/2022   HGSIL (high grade squamous intraepithelial lesion) on Pap smear of cervix 07/12/2021   Metabolic syndrome 01/10/2020   Morbid obesity (HCC) 01/10/2020   Migraines 11/13/2019   Hyperglycemia, unspecified 07/21/2018   SBO (small bowel obstruction) (HCC) 09/28/2017   Dysphagia    PCP NOTES >>>> 09/25/2015   Annual physical exam 09/05/2011   GERD 10/12/2009   Hyperlipidemia 09/22/2007   Anxiety and depression 09/22/2007   Essential hypertension 09/22/2007   Seasonal and perennial allergic rhinitis 09/22/2007   Obstructive sleep apnea 09/22/2007   Past Medical History:  Diagnosis Date   Allergy    Arthritis    Back pain    Bowel  obstruction (HCC) 2019   Depression    Gall bladder stones    GERD (gastroesophageal reflux disease)    Hyperlipidemia    Hypertension    IBS (irritable bowel syndrome)    IBS (irritable bowel syndrome)    Joint pain    Obesity    Lap band surgery 12-08 Dr Daphine Deutscher   OSA (obstructive sleep apnea)    using a CPAP     Pre-diabetes    REDUCTION MAMMOPLASTY, HX OF 09/22/2007   Qualifier: Diagnosis of  By: Drue Novel MD, Nolon Rod    Sleep apnea    doesn't use CPAP    Family History  Problem Relation Age of Onset   Breast cancer Mother        age 63   Heart disease Mother        CABG, smoker .age onset 70s   Diverticulitis Mother    High blood pressure Mother    High Cholesterol Mother    Thyroid disease Mother    Depression Mother    Obesity Mother    Coronary artery disease Father        CABG, smoker .age onset 38s   Prostate cancer Father    Pulmonary fibrosis Father        and others   High blood pressure Father    High Cholesterol Father    Depression Father    Obesity Father    Heart attack Brother        age 66   Diabetes Neg Hx    Colon cancer Neg Hx    Stomach cancer Neg Hx    Esophageal cancer Neg Hx    Rectal cancer Neg Hx     Past Surgical History:  Procedure Laterality Date   APPENDECTOMY     BREAST REDUCTION SURGERY     CHOLECYSTECTOMY     CLOSED REDUCTION FINGER WITH PERCUTANEOUS PINNING Left 09/14/2014   Procedure: CLOSED REDUCTION PERCUTANEOUS PINNING ;  Surgeon: Betha Loa, MD;  Location: [facility];  Service: Orthopedics;  Laterality: Left;   COLONOSCOPY     ESOPHAGEAL MANOMETRY N/A 09/23/2017   Procedure: ESOPHAGEAL MANOMETRY (EM);  Surgeon: Napoleon Form, MD;  Location: [facility];  Service: Endoscopy;  Laterality: N/A;   Lap Band Adjustment  09/01/2015   Dr. Andrey Campanile   LAPAROSCOPIC GASTRIC BANDING  09/22/2007   Lap band surgery 12-08 Dr Daphine Deutscher   NASAL SINUS SURGERY     TONSILLECTOMY  x2   UPPER GASTROINTESTINAL ENDOSCOPY      Social History   Occupational History   Occupation: [employer]- works from home    Comment: retired 2022  Tobacco Use   Smoking status: Never   Smokeless tobacco: Never  Vaping Use   Vaping status: Never Used  Substance and Sexual Activity   Alcohol use: Yes    Comment: socially    Drug use: No   Sexual activity: Yes    Partners: Male    Birth control/protection: None, Post-menopausal

## 2023-07-30 ENCOUNTER — Encounter: Payer: Self-pay | Admitting: Internal Medicine

## 2023-07-31 ENCOUNTER — Encounter: Payer: Self-pay | Admitting: Internal Medicine

## 2023-07-31 ENCOUNTER — Other Ambulatory Visit (HOSPITAL_BASED_OUTPATIENT_CLINIC_OR_DEPARTMENT_OTHER): Payer: Self-pay

## 2023-07-31 ENCOUNTER — Ambulatory Visit: Payer: 59 | Admitting: Internal Medicine

## 2023-07-31 VITALS — BP 128/78 | HR 76 | Ht 64.0 in | Wt 177.1 lb

## 2023-07-31 DIAGNOSIS — E782 Mixed hyperlipidemia: Secondary | ICD-10-CM | POA: Diagnosis not present

## 2023-07-31 DIAGNOSIS — Z23 Encounter for immunization: Secondary | ICD-10-CM

## 2023-07-31 DIAGNOSIS — Z0000 Encounter for general adult medical examination without abnormal findings: Secondary | ICD-10-CM | POA: Diagnosis not present

## 2023-07-31 DIAGNOSIS — R739 Hyperglycemia, unspecified: Secondary | ICD-10-CM | POA: Diagnosis not present

## 2023-07-31 LAB — BASIC METABOLIC PANEL
BUN: 12 mg/dL (ref 6–23)
CO2: 29 meq/L (ref 19–32)
Calcium: 9.4 mg/dL (ref 8.4–10.5)
Chloride: 100 meq/L (ref 96–112)
Creatinine, Ser: 0.85 mg/dL (ref 0.40–1.20)
GFR: 74.19 mL/min (ref >60.00)
Glucose, Bld: 76 mg/dL (ref 70–99)
Potassium: 4 meq/L (ref 3.5–5.1)
Sodium: 139 meq/L (ref 135–145)

## 2023-07-31 LAB — LIPID PANEL
Cholesterol: 127 mg/dL (ref 0–200)
HDL: 46.3 mg/dL (ref >39.00)
LDL Cholesterol: 61 mg/dL (ref 0–99)
NonHDL: 80.47
Total CHOL/HDL Ratio: 3
Triglycerides: 97 mg/dL (ref 0.0–149.0)
VLDL: 19.4 mg/dL (ref 0.0–40.0)

## 2023-07-31 LAB — HEMOGLOBIN A1C: Hgb A1c MFr Bld: 6 % (ref 4.6–6.5)

## 2023-07-31 MED ORDER — SEMAGLUTIDE-WEIGHT MANAGEMENT 2.4 MG/0.75ML ~~LOC~~ SOAJ
2.4000 mg | SUBCUTANEOUS | 11 refills | Status: DC
  Filled 2023-07-31: qty 3, 28d supply, fill #0
  Filled 2023-08-25: qty 3, 28d supply, fill #1
  Filled 2023-10-02: qty 3, 28d supply, fill #2
  Filled 2023-10-30: qty 3, 28d supply, fill #3
  Filled 2023-11-27: qty 3, 28d supply, fill #4
  Filled 2023-12-25: qty 3, 28d supply, fill #5
  Filled 2024-01-22: qty 3, 28d supply, fill #6
  Filled 2024-02-21 – 2024-03-03 (×2): qty 3, 28d supply, fill #7
  Filled 2024-03-26: qty 3, 28d supply, fill #8
  Filled 2024-04-23: qty 3, 28d supply, fill #9
  Filled 2024-05-21: qty 3, 28d supply, fill #10
  Filled 2024-06-18: qty 3, 28d supply, fill #11

## 2023-07-31 NOTE — Assessment & Plan Note (Signed)
Here for CPX -Td 6-14.  Declines booster today. -PNM 23: 09-2015;  Prevnar 05-2016 - shingrix x 2   -Flu shot today - rec : RSV, covid booster  -Female care: per gyn, [facility] 06/2022 ([facility]) -[facility]S--08/2012--adenomatous polyp, Dr Russella Dar, Cscope  08/2017 ,cscope 08/2022, next per GI -Diet, exercise: advise and encouragement provided  -Labs: BMP FLP A1c - Healthcare POA discussed.

## 2023-07-31 NOTE — Progress Notes (Signed)
Subjective:    Patient ID: Rebekah [name], female    DOB: 07/01/1961, 62 y.o.   MRN: [id number]  DOS:  07/31/2023 Type of visit - description: CPX  Here for CPX. Chronic medical problems addressed. In general feels well except for GI side effects from [facility] including diarrhea and nausea.  No blood in the stools. H/o  OSA, denies fatigue and sleeps well   Wt Readings from Last 3 Encounters:  07/31/23 177 lb 2 oz (80.3 kg)  05/02/23 195 lb 6 oz (88.6 kg)  02/25/23 195 lb 6 oz (88.6 kg)    Review of Systems  Other than above, a 14 point review of systems is negative    Past Medical History:  Diagnosis Date   Allergy    Arthritis    Back pain    Bowel obstruction (HCC) 2019   Depression    Gall bladder stones    GERD (gastroesophageal reflux disease)    Hyperlipidemia    Hypertension    IBS (irritable bowel syndrome)    IBS (irritable bowel syndrome)    Joint pain    Obesity    Lap band surgery 12-08 Dr Daphine Deutscher   OSA (obstructive sleep apnea)    using a CPAP     Pre-diabetes    REDUCTION MAMMOPLASTY, HX OF 09/22/2007   Qualifier: Diagnosis of  By: Drue Novel MD, Nolon Rod.    Sleep apnea    doesn't use CPAP    Past Surgical History:  Procedure Laterality Date   APPENDECTOMY     BREAST REDUCTION SURGERY     CHOLECYSTECTOMY     CLOSED REDUCTION FINGER WITH PERCUTANEOUS PINNING Left 09/14/2014   Procedure: CLOSED REDUCTION PERCUTANEOUS PINNING ;  Surgeon: Betha Loa, MD;  Location: [facility];  Service: Orthopedics;  Laterality: Left;   COLONOSCOPY     ESOPHAGEAL MANOMETRY N/A 09/23/2017   Procedure: ESOPHAGEAL MANOMETRY (EM);  Surgeon: Napoleon Form, MD;  Location: [facility];  Service: Endoscopy;  Laterality: N/A;   Lap Band Adjustment  09/01/2015   Dr. Andrey Campanile   LAPAROSCOPIC GASTRIC BANDING  09/22/2007   Lap band surgery 12-08 Dr Daphine Deutscher   NASAL SINUS SURGERY     TONSILLECTOMY     x2   UPPER GASTROINTESTINAL ENDOSCOPY     Social History    Socioeconomic History   Marital status: Married    Spouse name: daniel   Number of children: 0   Years of education: Not on file   Highest education level: Bachelor's degree (e.g., BA, AB, BS)  Occupational History   Occupation: RETIRED--[employer]- works from home    Comment: retired 2022  Tobacco Use   Smoking status: Never   Smokeless tobacco: Never  Vaping Use   Vaping status: Never Used  Substance and Sexual Activity   Alcohol use: Yes    Comment: socially    Drug use: No   Sexual activity: Yes    Partners: Male    Birth control/protection: None, Post-menopausal  Other Topics Concern   Not on file  Social History Narrative   Lives w/ husband   Social Determinants of Health   Financial Resource Strain: Medium Risk (02/21/2023)   Overall Financial Resource Strain (CARDIA)    Difficulty of Paying Living Expenses: Somewhat hard  Food Insecurity: No Food Insecurity (02/21/2023)   Hunger Vital Sign    Worried About Running Out of Food in the Last Year: Never true    Ran Out of Food in the  Last Year: Never true  Transportation Needs: No Transportation Needs (02/21/2023)   PRAPARE - Administrator, Civil Service (Medical): No    Lack of Transportation (Non-Medical): No  Physical Activity: Insufficiently Active (02/21/2023)   Exercise Vital Sign    Days of Exercise per Week: 3 days    Minutes of Exercise per Session: 30 min  Stress: Stress Concern Present (02/21/2023)   [employer] of Occupational Health - Occupational Stress Questionnaire    Feeling of Stress : Rather much  Social Connections: Moderately Integrated (02/21/2023)   Social Connection and Isolation Panel [NHANES]    Frequency of Communication with Friends and Family: More than three times a week    Frequency of Social Gatherings with Friends and Family: Twice a week    Attends Religious Services: 1 to 4 times per year    Active Member of [employer] or Organizations: No    Attends Hospital doctor: Not on file    Marital Status: Married  Catering manager Violence: Not on file     Current Outpatient Medications  Medication Instructions   alendronate (FOSAMAX) 70 mg, Oral, Every 7 days, Take with a full glass of water on an empty stomach.   aspirin EC 81 mg, Oral, Daily, Swallow whole.   Divigel 1 mg, Transdermal, Daily   ELDERBERRY PO No dose, route, or frequency recorded.   empagliflozin (JARDIANCE) 10 mg, Oral, Daily before breakfast   hydrochlorothiazide (MICROZIDE) 12.5 mg, Oral, Daily   L-Methylfolate-Algae (DEPLIN 7.5) 7.5-90.314 MG CAPS 1 tablet, Oral, Daily   nortriptyline (PAMELOR) 75 mg, Oral, Daily   omeprazole (PRILOSEC) 40 mg, Oral, Daily   ondansetron (ZOFRAN-ODT) 4 mg, Oral, Every 8 hours PRN   Probiotic Product (PROBIOTIC-10) CHEW 1 tablet, Oral, Daily   progesterone (PROMETRIUM) 100 mg, Oral, Daily   rosuvastatin (CRESTOR) 10 mg, Oral, Daily   Semaglutide-Weight Management 2.4 mg, Subcutaneous, Weekly       Objective:   Physical Exam BP 128/78   Pulse 76   Ht 5\' 4"  (1.626 m)   Wt 177 lb 2 oz (80.3 kg)   SpO2 97%   BMI 30.40 kg/m  General: Well developed, NAD, BMI noted Neck: No  thyromegaly  HEENT:  Normocephalic . Face symmetric, atraumatic Lungs:  CTA B Normal respiratory effort, no intercostal retractions, no accessory muscle use. Heart: RRR,  no murmur.  Abdomen:  Not distended, soft, non-tender. No rebound or rigidity.  Lap band noted at the RUQ Lower extremities: no pretibial edema bilaterally  Skin: Exposed areas without rash. Not pale. Not jaundice Neurologic:  alert & oriented X3.  Speech normal, gait appropriate for age and unassisted Strength symmetric and appropriate for age.  Psych: Cognition and judgment appear intact.  Cooperative with normal attention span and concentration.  Behavior appropriate. No anxious or depressed appearing.     Assessment     Assessment  Prediabetes HTN Hyperlipidemia: lipitor aches  (2018) Anxiety, depression (occ suicidal ideas, h/o self cutting). Used to see Dr Donell Beers GERD- IBS Migraines  Obesity, lap band surgery 2008, adjustment 2016 OSA: Intolerant to CPAP Acne (doxy) ++FH CAD: F, M , B MI age 75, G-parents  Osteopenia: T score -2.3   09/2020, Rx Fosamax SBO 09-2017  PLAN: Here for CPX -Td 6-14.  Declines booster today. -PNM 23: 09-2015;  Prevnar 05-2016 - shingrix x 2   -Flu shot today - rec : RSV, covid booster  -Female care: per gyn, [facility] 06/2022 ([facility]) -CCS--08/2012--adenomatous polyp, Dr  Russella Dar, Cscope  08/2017 ,cscope 08/2022, next per GI -Diet, exercise: advise and encouragement provided  -Labs: BMP FLP A1c - Healthcare POA discussed. Diabetes: Continue Jardiance, check A1c. HTN: BP is very good, continue HCTZ.  Labs. High cholesterol: On Crestor, last LFTs normal, check FLP, LDL goal 70, +FH CAD  Obesity,  on Wegovy, has s/e, recommend to discuss with prescriber. OSA: Intolerant to CPAP, currently with no symptoms. Osteopenia: DEXA 2021, on Fosamax x 5 years. RTC 6 months

## 2023-07-31 NOTE — Assessment & Plan Note (Signed)
Here for CPX Diabetes: Continue Jardiance, check A1c. HTN: BP is very good, continue HCTZ.  Labs. High cholesterol: On Crestor, last LFTs normal, check FLP, LDL goal 70, +FH CAD  Obesity,  on Wegovy, has s/e, recommend to discuss with prescriber. OSA: Intolerant to CPAP, currently with no symptoms. Osteopenia: DEXA 2021, on Fosamax x 5 years. RTC 6 months

## 2023-07-31 NOTE — Addendum Note (Signed)
Addended by: Cheree Ditto on: 07/31/2023 01:03 PM   Modules accepted: Orders

## 2023-07-31 NOTE — Patient Instructions (Addendum)
Vaccines I recommend- Covid booster, RSV, tetanus    Check the  blood pressure regularly Blood pressure goal:  between 110/65 and  135/85. If it is consistently higher or lower, let me know     GO TO THE LAB : Get the blood work     Next visit with me in 6 months   please schedule it at the front desk        "Health Care Power of attorney" ,  "Living will" (Advance care planning documents)  If you already have a living will or healthcare power of attorney, is recommended you bring the copy to be scanned in your chart.   The document will be available to all the doctors you see in the system.  Advance care planning is a process that supports adults in  understanding and sharing their preferences regarding future medical care.  The patient's preferences are recorded in documents called Advance Directives and the can be modified at any time while the patient is in full mental capacity.   If you don't have one, please consider create one.      More information at: [email or web address]

## 2023-08-07 ENCOUNTER — Ambulatory Visit: Payer: 59 | Admitting: Family

## 2023-08-11 ENCOUNTER — Other Ambulatory Visit: Payer: Self-pay | Admitting: Internal Medicine

## 2023-08-13 ENCOUNTER — Encounter: Payer: Self-pay | Admitting: Internal Medicine

## 2023-08-19 ENCOUNTER — Other Ambulatory Visit: Payer: Self-pay | Admitting: Internal Medicine

## 2023-08-20 LAB — HM DIABETES EYE EXAM

## 2023-08-21 ENCOUNTER — Ambulatory Visit: Payer: 59 | Admitting: Family

## 2023-08-21 DIAGNOSIS — M1712 Unilateral primary osteoarthritis, left knee: Secondary | ICD-10-CM

## 2023-08-22 ENCOUNTER — Encounter: Payer: Self-pay | Admitting: Internal Medicine

## 2023-08-23 ENCOUNTER — Encounter: Payer: Self-pay | Admitting: Family

## 2023-08-23 DIAGNOSIS — M1712 Unilateral primary osteoarthritis, left knee: Secondary | ICD-10-CM

## 2023-08-23 MED ORDER — METHYLPREDNISOLONE ACETATE 40 MG/ML IJ SUSP
40.0000 mg | INTRAMUSCULAR | Status: AC | PRN
  Administered 2023-08-23: 40 mg via INTRA_ARTICULAR

## 2023-08-23 MED ORDER — LIDOCAINE HCL 1 % IJ SOLN
5.0000 mL | INTRAMUSCULAR | Status: AC | PRN
  Administered 2023-08-23: 5 mL

## 2023-08-23 NOTE — Progress Notes (Signed)
Office Visit Note   Patient: Rebekah [name]           Date of Birth: 31-Dec-1960           MRN: [id number] Visit Date: 08/21/2023              Requested by: Wanda Plump, MD [address] [place],  [place] [address] PCP: Wanda Plump, MD  Chief Complaint  Patient presents with   Left Knee - Pain      HPI: The patient is a 62 year old woman who is seen in follow-up for ongoing left knee pain.  This has been ongoing for about 3 months now without much improvement.  She is having left knee pain which is medial deep pain and intermittent swelling especially with ascending and descending stairs.  Complains of some stiffness pain with deep flexion difficulty with sleep this hurts with knee extended in bed no locking or catching  Reports last injection gave relief for about 3 weeks  Assessment & Plan: Visit Diagnoses: No diagnosis found.  Plan: Suspect possible meniscus injury.  At this point the patient would like to continue with conservative measures we will hold off on any intervention or MRI Depo-Medrol injection again today Follow-Up Instructions: No follow-ups on file.   Left Knee Exam   Muscle Strength  The patient has normal left knee strength.  Tenderness  The patient is experiencing tenderness in the medial joint line.  Range of Motion  The patient has normal left knee ROM.  Tests  Varus: negative Valgus: negative  Other  Swelling: mild      Patient is alert, oriented, no adenopathy, well-dressed, normal affect, normal respiratory effort.   Imaging: No results found. No images are attached to the encounter.  Labs: Lab Results  Component Value Date   HGBA1C 6.0 07/31/2023   HGBA1C 6.4 02/25/2023   HGBA1C 6.2 10/23/2022   ESRSEDRATE 18 06/14/2016   ESRSEDRATE 31 (H) 06/20/2011   LABORGA Insignificant Growth 07/04/2012     Lab Results  Component Value Date   ALBUMIN 4.0 05/02/2023   ALBUMIN 4.5 07/27/2022   ALBUMIN 4.3 01/29/2022     No results found for: "MG" Lab Results  Component Value Date   VD25OH 54.3 10/25/2021   VD25OH 61.8 03/08/2021   VD25OH 44.0 09/29/2020    No results found for: "PREALBUMIN"    Latest Ref Rng & Units 05/02/2023   11:35 PM 05/02/2023    8:48 PM 02/25/2023    9:18 AM  CBC EXTENDED  WBC 4.0 - 10.5 K/uL  15.8  11.6   RBC 3.87 - 5.11 MIL/uL  5.68  5.15   Hemoglobin 12.0 - 15.0 g/dL 88.4  16.6  06.3   HCT 36.0 - 46.0 % 44.0  48.5  42.4   Platelets 150 - 400 K/uL  337  313.0   NEUT# 1.7 - 7.7 K/uL  12.9  6.9   Lymph# 0.7 - 4.0 K/uL  2.1  3.1      There is no height or weight on file to calculate BMI.  Orders:  No orders of the defined types were placed in this encounter.  No orders of the defined types were placed in this encounter.    Procedures: Large Joint Inj: L knee on 08/23/2023 8:34 AM Indications: pain Details: 18 G 1.5 in needle, anteromedial approach Medications: 5 mL lidocaine 1 %; 40 mg methylPREDNISolone acetate 40 MG/ML Consent was given by the patient.  Clinical Data: No additional findings.  ROS:  All other systems negative, except as noted in the HPI. Review of Systems  Objective: Vital Signs: There were no vitals taken for this visit.  Specialty Comments:  No specialty comments available.  PMFS History: Patient Active Problem List   Diagnosis Date Noted   Atypical glandular cells on cervical Pap smear 07/26/2022   Osteopenia 01/29/2022   HGSIL (high grade squamous intraepithelial lesion) on Pap smear of cervix 07/12/2021   Metabolic syndrome 01/10/2020   Morbid obesity (HCC) 01/10/2020   Migraines 11/13/2019   Hyperglycemia, unspecified 07/21/2018   SBO (small bowel obstruction) (HCC) 09/28/2017   Dysphagia    PCP NOTES >>>> 09/25/2015   Annual physical exam 09/05/2011   GERD 10/12/2009   Hyperlipidemia 09/22/2007   Anxiety and depression 09/22/2007   Essential hypertension 09/22/2007   Seasonal and perennial allergic rhinitis  09/22/2007   Obstructive sleep apnea 09/22/2007   Past Medical History:  Diagnosis Date   Allergy    Arthritis    Back pain    Bowel obstruction (HCC) 2019   Depression    Gall bladder stones    GERD (gastroesophageal reflux disease)    Hyperlipidemia    Hypertension    IBS (irritable bowel syndrome)    IBS (irritable bowel syndrome)    Joint pain    Obesity    Lap band surgery 12-08 Dr Daphine Deutscher   OSA (obstructive sleep apnea)    using a CPAP     Pre-diabetes    REDUCTION MAMMOPLASTY, HX OF 09/22/2007   Qualifier: Diagnosis of  By: Drue Novel MD, Nolon Rod    Sleep apnea    doesn't use CPAP    Family History  Problem Relation Age of Onset   Breast cancer Mother        age 9   Heart disease Mother        CABG, smoker .age onset 74s   Diverticulitis Mother    High blood pressure Mother    High Cholesterol Mother    Thyroid disease Mother    Depression Mother    Obesity Mother    Coronary artery disease Father        CABG, smoker .age onset 2s   Prostate cancer Father    Pulmonary fibrosis Father        and others   High blood pressure Father    High Cholesterol Father    Depression Father    Obesity Father    Heart attack Brother        age 87   Diabetes Neg Hx    Colon cancer Neg Hx    Stomach cancer Neg Hx    Esophageal cancer Neg Hx    Rectal cancer Neg Hx     Past Surgical History:  Procedure Laterality Date   APPENDECTOMY     BREAST REDUCTION SURGERY     CHOLECYSTECTOMY     CLOSED REDUCTION FINGER WITH PERCUTANEOUS PINNING Left 09/14/2014   Procedure: CLOSED REDUCTION PERCUTANEOUS PINNING ;  Surgeon: Betha Loa, MD;  Location: [facility];  Service: Orthopedics;  Laterality: Left;   COLONOSCOPY     ESOPHAGEAL MANOMETRY N/A 09/23/2017   Procedure: ESOPHAGEAL MANOMETRY (EM);  Surgeon: Napoleon Form, MD;  Location: [facility];  Service: Endoscopy;  Laterality: N/A;   Lap Band Adjustment  09/01/2015   Dr. Andrey Campanile   LAPAROSCOPIC GASTRIC  BANDING  09/22/2007   Lap band surgery 12-08 Dr Daphine Deutscher   NASAL SINUS  SURGERY     TONSILLECTOMY     x2   UPPER GASTROINTESTINAL ENDOSCOPY     Social History   Occupational History   Occupation: RETIRED--[employer]- works from home    Comment: retired 2022  Tobacco Use   Smoking status: Never   Smokeless tobacco: Never  Vaping Use   Vaping status: Never Used  Substance and Sexual Activity   Alcohol use: Yes    Comment: socially    Drug use: No   Sexual activity: Yes    Partners: Male    Birth control/protection: None, Post-menopausal

## 2023-08-26 ENCOUNTER — Other Ambulatory Visit (HOSPITAL_BASED_OUTPATIENT_CLINIC_OR_DEPARTMENT_OTHER): Payer: Self-pay

## 2023-08-28 ENCOUNTER — Other Ambulatory Visit (HOSPITAL_BASED_OUTPATIENT_CLINIC_OR_DEPARTMENT_OTHER): Payer: Self-pay

## 2023-09-03 ENCOUNTER — Other Ambulatory Visit (HOSPITAL_BASED_OUTPATIENT_CLINIC_OR_DEPARTMENT_OTHER): Payer: Self-pay

## 2023-09-05 ENCOUNTER — Telehealth: Payer: Self-pay

## 2023-09-05 ENCOUNTER — Other Ambulatory Visit (HOSPITAL_COMMUNITY): Payer: Self-pay

## 2023-09-06 ENCOUNTER — Other Ambulatory Visit: Payer: Self-pay

## 2023-09-06 ENCOUNTER — Other Ambulatory Visit (HOSPITAL_COMMUNITY): Payer: Self-pay

## 2023-09-06 NOTE — Telephone Encounter (Signed)
Pharmacy Patient Advocate Encounter  Received notification from [facility] that Prior Authorization for [facility] has been APPROVED from 09/05/23 to 09/04/24. Ran test claim, Copay is $5. This test claim was processed through [facility]- copay amounts may vary at other pharmacies due to pharmacy/plan contracts, or as the patient moves through the different stages of their insurance plan.

## 2023-09-06 NOTE — Telephone Encounter (Signed)
Pharmacy Patient Advocate Encounter   Received notification from CoverMyMeds that prior authorization for WEGOVY 2.4 MG/.75ML is required/requested.   Insurance verification completed.   The patient is insured through [facility] .   Per test claim: PA required; PA submitted to above mentioned insurance via CoverMyMeds Key/confirmation #/EOC [id number] Status is pending

## 2023-09-09 ENCOUNTER — Other Ambulatory Visit (HOSPITAL_BASED_OUTPATIENT_CLINIC_OR_DEPARTMENT_OTHER): Payer: Self-pay

## 2023-09-09 ENCOUNTER — Telehealth: Payer: Self-pay | Admitting: Cardiology

## 2023-09-09 NOTE — Telephone Encounter (Signed)
Pt calling to f/u on Prior Auth for [facility]. Please advise

## 2023-11-16 ENCOUNTER — Other Ambulatory Visit: Payer: Self-pay | Admitting: Internal Medicine

## 2023-11-16 DIAGNOSIS — I10 Essential (primary) hypertension: Secondary | ICD-10-CM

## 2023-11-26 ENCOUNTER — Other Ambulatory Visit: Payer: Self-pay | Admitting: Internal Medicine

## 2024-01-12 ENCOUNTER — Other Ambulatory Visit: Payer: Self-pay | Admitting: Internal Medicine

## 2024-01-14 ENCOUNTER — Other Ambulatory Visit: Payer: Self-pay | Admitting: Internal Medicine

## 2024-01-29 ENCOUNTER — Other Ambulatory Visit (HOSPITAL_BASED_OUTPATIENT_CLINIC_OR_DEPARTMENT_OTHER): Payer: Self-pay

## 2024-01-29 ENCOUNTER — Ambulatory Visit: Payer: 59 | Admitting: Internal Medicine

## 2024-01-29 ENCOUNTER — Encounter: Payer: Self-pay | Admitting: Internal Medicine

## 2024-01-29 VITALS — BP 118/68 | HR 73 | Temp 98.1°F | Resp 16 | Ht 64.0 in | Wt 175.4 lb

## 2024-01-29 DIAGNOSIS — E782 Mixed hyperlipidemia: Secondary | ICD-10-CM

## 2024-01-29 DIAGNOSIS — I10 Essential (primary) hypertension: Secondary | ICD-10-CM

## 2024-01-29 DIAGNOSIS — R739 Hyperglycemia, unspecified: Secondary | ICD-10-CM | POA: Diagnosis not present

## 2024-01-29 DIAGNOSIS — Z23 Encounter for immunization: Secondary | ICD-10-CM | POA: Diagnosis not present

## 2024-01-29 DIAGNOSIS — R110 Nausea: Secondary | ICD-10-CM

## 2024-01-29 LAB — CBC WITH DIFFERENTIAL/PLATELET
Basophils Absolute: 0.1 10*3/uL (ref 0.0–0.1)
Basophils Relative: 0.6 % (ref 0.0–3.0)
Eosinophils Absolute: 0.2 10*3/uL (ref 0.0–0.7)
Eosinophils Relative: 2.2 % (ref 0.0–5.0)
HCT: 46.8 % — ABNORMAL HIGH (ref 36.0–46.0)
Hemoglobin: 15.3 g/dL — ABNORMAL HIGH (ref 12.0–15.0)
Lymphocytes Relative: 34.4 % (ref 12.0–46.0)
Lymphs Abs: 3 10*3/uL (ref 0.7–4.0)
MCHC: 32.7 g/dL (ref 30.0–36.0)
MCV: 89.5 fl (ref 78.0–100.0)
Monocytes Absolute: 0.5 10*3/uL (ref 0.1–1.0)
Monocytes Relative: 6.3 % (ref 3.0–12.0)
Neutro Abs: 4.9 10*3/uL (ref 1.4–7.7)
Neutrophils Relative %: 56.5 % (ref 43.0–77.0)
Platelets: 318 10*3/uL (ref 150.0–400.0)
RBC: 5.23 Mil/uL — ABNORMAL HIGH (ref 3.87–5.11)
RDW: 15.2 % (ref 11.5–15.5)
WBC: 8.6 10*3/uL (ref 4.0–10.5)

## 2024-01-29 LAB — BASIC METABOLIC PANEL WITH GFR
BUN: 11 mg/dL (ref 6–23)
CO2: 29 meq/L (ref 19–32)
Calcium: 9.6 mg/dL (ref 8.4–10.5)
Chloride: 102 meq/L (ref 96–112)
Creatinine, Ser: 0.96 mg/dL (ref 0.40–1.20)
GFR: 63.88 mL/min (ref >60.00)
Glucose, Bld: 80 mg/dL (ref 70–99)
Potassium: 4 meq/L (ref 3.5–5.1)
Sodium: 140 meq/L (ref 135–145)

## 2024-01-29 LAB — HEMOGLOBIN A1C: Hgb A1c MFr Bld: 5.8 % (ref 4.6–6.5)

## 2024-01-29 MED ORDER — ONDANSETRON 4 MG PO TBDP
4.0000 mg | ORAL_TABLET | Freq: Three times a day (TID) | ORAL | 3 refills | Status: AC | PRN
  Filled 2024-01-29: qty 20, 7d supply, fill #0
  Filled 2024-06-08: qty 20, 7d supply, fill #1

## 2024-01-29 NOTE — Patient Instructions (Addendum)
 Exercise regularly throughout the year.  Aim to 3 hours a week.  Vaccines I recommend: Covid booster   Continue to checking your  blood pressure regularly Blood pressure goal:  between 110/65 and  135/85. If it is consistently higher or lower, let me know     GO TO THE LAB : Get the blood work     Please go to the front desk: Schedule a physical exam by October 2025

## 2024-01-29 NOTE — Progress Notes (Unsigned)
 Subjective:    Patient ID: Rebekah [name], female    DOB: Jun 11, 1961, 63 y.o.   MRN: [id number]  DOS:  01/29/2024 Type of visit - description: Follow-up  Routine follow-up, doing well. Ambulatory BPs very good. Weight loss has plateaued Wt Readings from Last 3 Encounters:  01/29/24 175 lb 6 oz (79.5 kg)  07/31/23 177 lb 2 oz (80.3 kg)  05/02/23 195 lb 6 oz (88.6 kg)     Review of Systems See above   Past Medical History:  Diagnosis Date   Allergy    Arthritis    Back pain    Bowel obstruction (HCC) 2019   Depression    Gall bladder stones    GERD (gastroesophageal reflux disease)    Hyperlipidemia    Hypertension    IBS (irritable bowel syndrome)    IBS (irritable bowel syndrome)    Joint pain    Obesity    Lap band surgery 12-08 Dr Daphine Deutscher   OSA (obstructive sleep apnea)    using a CPAP     Pre-diabetes    REDUCTION MAMMOPLASTY, HX OF 09/22/2007   Qualifier: Diagnosis of  By: Drue Novel MD, Nolon Rod.    Sleep apnea    doesn't use CPAP    Past Surgical History:  Procedure Laterality Date   APPENDECTOMY     BREAST REDUCTION SURGERY     CHOLECYSTECTOMY     CLOSED REDUCTION FINGER WITH PERCUTANEOUS PINNING Left 09/14/2014   Procedure: CLOSED REDUCTION PERCUTANEOUS PINNING ;  Surgeon: Betha Loa, MD;  Location: [facility];  Service: Orthopedics;  Laterality: Left;   COLONOSCOPY     ESOPHAGEAL MANOMETRY N/A 09/23/2017   Procedure: ESOPHAGEAL MANOMETRY (EM);  Surgeon: Napoleon Form, MD;  Location: [facility];  Service: Endoscopy;  Laterality: N/A;   Lap Band Adjustment  09/01/2015   Dr. Andrey Campanile   LAPAROSCOPIC GASTRIC BANDING  09/22/2007   Lap band surgery 12-08 Dr Daphine Deutscher   NASAL SINUS SURGERY     TONSILLECTOMY     x2   UPPER GASTROINTESTINAL ENDOSCOPY      Current Outpatient Medications  Medication Instructions   alendronate (FOSAMAX) 70 mg, Oral, Every 7 days, Take with a full glass of water on an empty stomach.   aspirin EC 81 mg, Daily    Divigel 1 mg, Daily   ELDERBERRY PO No dose, route, or frequency recorded.   empagliflozin (JARDIANCE) 10 mg, Oral, Daily before breakfast   hydrochlorothiazide (MICROZIDE) 12.5 mg, Oral, Daily   L-Methylfolate-Algae (DEPLIN 7.5) 7.5-90.314 MG CAPS 1 tablet, Oral, Daily   nortriptyline (PAMELOR) 75 mg, Oral, Daily   omeprazole (PRILOSEC) 40 mg, Oral, Daily   ondansetron (ZOFRAN-ODT) 4 mg, Oral, Every 8 hours PRN   Probiotic Product (PROBIOTIC-10) CHEW 1 tablet, Daily   progesterone (PROMETRIUM) 100 mg, Daily   rosuvastatin (CRESTOR) 10 mg, Oral, Daily   Wegovy 2.4 mg, Subcutaneous, Weekly       Objective:   Physical Exam BP 118/68   Pulse 73   Temp 98.1 F (36.7 C) (Oral)   Resp 16   Ht 5\' 4"  (1.626 m)   Wt 175 lb 6 oz (79.5 kg)   SpO2 97%   BMI 30.10 kg/m  General:   Well developed, NAD, BMI noted. HEENT:  Normocephalic . Face symmetric, atraumatic Lungs:  CTA B Normal respiratory effort, no intercostal retractions, no accessory muscle use. Heart: RRR,  no murmur.  Lower extremities: no pretibial edema bilaterally  Skin: Not pale. Not  jaundice Neurologic:  alert & oriented X3.  Speech normal, gait appropriate for age and unassisted Psych--  Cognition and judgment appear intact.  Cooperative with normal attention span and concentration.  Behavior appropriate. No anxious or depressed appearing.      Assessment      Assessment  Prediabetes HTN Hyperlipidemia: lipitor aches (2018) Anxiety, depression (occ suicidal ideas, h/o self cutting). Used to see Dr Donell Beers GERD- IBS Migraines  Obesity, lap band surgery 2008, adjustment 2016 OSA: Intolerant to CPAP Acne (doxy) ++FH CAD: F, M , B MI age 50, G-parents  Osteopenia: T score -2.3   09/2020, Rx Fosamax 2021 SBO 09-2017  PLAN: Prediabetes: Last A1c 6.0.  On Jardiance on Wegovy.    Check A1c. HTN: Normal ambulatory BPs, 120/80, continue HCTZ, check a BMP and CBC. Cholesterol: With + FH CAD, LDL goal 70,On  rosuvastatin, last LDL 61.  No change Nausea: Episodic nausea, Rx Zofran. Obesity: On Jardiance, Wegovy, Weight loss has plateaued, admits that she does not exercise during the winter,  encouraged to exercise year-round. Preventive care: Tdap today, COVID booster recommended RTC 07-2024 CPX 10 Here for CPX -Td 6-14.  Declines booster today. -PNM 23: 09-2015;  Prevnar 05-2016 - shingrix x 2   -Flu shot today - rec : RSV, covid booster  -Female care: per gyn, [facility] 06/2022 ([facility]) -[facility]--08/2012--adenomatous polyp, Dr Russella Dar, Cscope  08/2017 ,cscope 08/2022, next per GI -Diet, exercise: advise and encouragement provided  -Labs: BMP FLP A1c - Healthcare POA discussed. Diabetes: Continue Jardiance, check A1c. HTN: BP is very good, continue HCTZ.  Labs. High cholesterol: On Crestor, last LFTs normal, check FLP, LDL goal 70, +FH CAD  Obesity,  on Wegovy, has s/e, recommend to discuss with prescriber. OSA: Intolerant to CPAP, currently with no symptoms. Osteopenia: DEXA 2021, on Fosamax x 5 years. RTC 6 months

## 2024-01-30 NOTE — Assessment & Plan Note (Signed)
 Prediabetes: Last A1c 6.0.  On Jardiance on Wegovy.    Check A1c. HTN: Normal ambulatory BPs, 120/80, continue HCTZ, check a BMP and CBC. Hyperlipidemia: With + FH CAD, LDL goal 70,On rosuvastatin, last LDL 61.  No change Nausea: Episodic nausea, Rx Zofran. Obesity: On Jardiance, Wegovy, Weight loss has plateau, admits that she does not exercise during the winter,  encouraged to exercise year-round. Preventive care: Tdap today, COVID booster recommended RTC 07-2024 CPX

## 2024-01-31 ENCOUNTER — Encounter: Payer: Self-pay | Admitting: Internal Medicine

## 2024-02-09 ENCOUNTER — Encounter: Payer: Self-pay | Admitting: Internal Medicine

## 2024-02-10 ENCOUNTER — Other Ambulatory Visit (HOSPITAL_BASED_OUTPATIENT_CLINIC_OR_DEPARTMENT_OTHER): Payer: Self-pay

## 2024-02-10 MED ORDER — FLUCONAZOLE 150 MG PO TABS
150.0000 mg | ORAL_TABLET | Freq: Every day | ORAL | 0 refills | Status: DC
  Filled 2024-02-10: qty 2, 2d supply, fill #0

## 2024-02-18 ENCOUNTER — Encounter (HOSPITAL_BASED_OUTPATIENT_CLINIC_OR_DEPARTMENT_OTHER): Payer: Self-pay | Admitting: Emergency Medicine

## 2024-02-18 ENCOUNTER — Emergency Department (HOSPITAL_BASED_OUTPATIENT_CLINIC_OR_DEPARTMENT_OTHER)
Admission: EM | Admit: 2024-02-18 | Discharge: 2024-02-18 | Disposition: A | Discharge status: Home / Self Care | Attending: Emergency Medicine | Admitting: Emergency Medicine

## 2024-02-18 ENCOUNTER — Other Ambulatory Visit: Payer: Self-pay

## 2024-02-18 DIAGNOSIS — Z794 Long term (current) use of insulin: Secondary | ICD-10-CM | POA: Insufficient documentation

## 2024-02-18 DIAGNOSIS — H43391 Other vitreous opacities, right eye: Secondary | ICD-10-CM | POA: Insufficient documentation

## 2024-02-18 DIAGNOSIS — H579 Unspecified disorder of eye and adnexa: Secondary | ICD-10-CM | POA: Diagnosis present

## 2024-02-18 DIAGNOSIS — Z7982 Long term (current) use of aspirin: Secondary | ICD-10-CM | POA: Diagnosis not present

## 2024-02-18 NOTE — Discharge Instructions (Signed)
 Follow-up tomorrow in the ophthalmology office with Dr. Orlena Bitters.  Call at 830 when the office opens to make these arrangements.

## 2024-02-18 NOTE — ED Provider Notes (Signed)
 [place] EMERGENCY DEPARTMENT AT [facility] Provider Note   CSN: [id number] Arrival date & time: 02/18/24  2115     History  Chief Complaint  Patient presents with   Eye Problem    Darnella SHERA WHITTINGHILL is a 63 y.o. female.  Patient is a 63 year old female presenting with complaints of flashing lights and floaters in her right thigh.  This started earlier this evening.  Patient denies any pain.  She denies any injury or trauma.  No aggravating or alleviating factors.       Home Medications Prior to Admission medications   Medication Sig Start Date End Date Taking? Authorizing Provider  alendronate  (FOSAMAX ) 70 MG tablet Take 1 tablet (70 mg total) by mouth every 7 (seven) days. Take with a full glass of water on an empty stomach. 01/31/22   Ezell Hollow, MD  aspirin  EC 81 MG tablet Take 81 mg by mouth daily. Swallow whole.    [provider]  empagliflozin  (JARDIANCE ) 10 MG TABS tablet Take 1 tablet (10 mg total) by mouth daily before breakfast. 01/15/24   Paz, Jose E, MD  Estradiol  (DIVIGEL ) 1 MG/GM GEL Place 1 mg onto the skin daily.    [provider]  fluconazole  (DIFLUCAN ) 150 MG tablet Take 1 tablet (150 mg total) by mouth daily. 02/10/24   Ezell Hollow, MD  hydrochlorothiazide  (MICROZIDE ) 12.5 MG capsule Take 1 capsule (12.5 mg total) by mouth daily. 11/18/23   Paz, Jose E, MD  L-Methylfolate-Algae (DEPLIN 7.5) 7.5-90.314 MG CAPS Take 1 tablet by mouth daily. 04/17/23   Ezell Hollow, MD  nortriptyline  (PAMELOR ) 75 MG capsule Take 1 capsule (75 mg total) by mouth daily. 11/26/23   Ezell Hollow, MD  omeprazole  (PRILOSEC) 40 MG capsule Take 1 capsule (40 mg total) by mouth daily. 01/13/24   Paz, Jose E, MD  ondansetron  (ZOFRAN -ODT) 4 MG disintegrating tablet Take 1 tablet (4 mg total) by mouth every 8 (eight) hours as needed for nausea or vomiting. 01/29/24   Paz, Jose E, MD  Probiotic Product (PROBIOTIC-10) CHEW Chew 1 tablet by mouth daily.     [provider]  progesterone (PROMETRIUM) 100 MG capsule Take 100 mg by mouth daily. 08/30/16   [provider]  rosuvastatin  (CRESTOR ) 10 MG tablet Take 1 tablet (10 mg total) by mouth daily. 01/13/24   Ezell Hollow, MD  Semaglutide -Weight Management 2.4 MG/0.75ML SOAJ Inject 2.4 mg into the skin once a week. 07/31/23   Sheryle Donning, MD      Allergies    Penicillins, Metformin  and related, and Erythromycin    Review of Systems   Review of Systems  All other systems reviewed and are negative.   Physical Exam Updated Vital Signs BP (!) 143/89   Pulse 73   Temp 98 F (36.7 C) (Oral)   Resp 18   SpO2 96%  Physical Exam Vitals and nursing note reviewed.  Constitutional:      Appearance: Normal appearance.  Eyes:     Extraocular Movements: Extraocular movements intact.     Pupils: Pupils are equal, round, and reactive to light.     Comments: The right eye is grossly normal in appearance.  Funduscopic examination reveals no obvious abnormalities.  Pulmonary:     Effort: Pulmonary effort is normal.  Skin:    General: Skin is warm and dry.  Neurological:     Mental Status: She is alert and oriented to person, place, and time.  ED Results / Procedures / Treatments   Labs (all labs ordered are listed, but only abnormal results are displayed) Labs Reviewed - No data to display  EKG None  Radiology No results found.  Procedures Procedures    Medications Ordered in ED Medications - No data to display  ED Course/ Medical Decision Making/ A&P  Patient presenting with floaters and flashers in her right eye.  I have discussed this with Dr. Orlena Bitters from ophthalmology.  Patient will follow-up in their office tomorrow.  Final Clinical Impression(s) / ED Diagnoses Final diagnoses:  None    Rx / DC Orders ED Discharge Orders     None         Orvilla Blander, MD 02/18/24 2335

## 2024-02-18 NOTE — ED Triage Notes (Signed)
 Right eye flashes and floaters. Started around [employer]

## 2024-03-02 ENCOUNTER — Other Ambulatory Visit (HOSPITAL_BASED_OUTPATIENT_CLINIC_OR_DEPARTMENT_OTHER): Payer: Self-pay

## 2024-03-03 ENCOUNTER — Ambulatory Visit (HOSPITAL_BASED_OUTPATIENT_CLINIC_OR_DEPARTMENT_OTHER): Payer: 59 | Admitting: Family

## 2024-03-03 ENCOUNTER — Other Ambulatory Visit (HOSPITAL_BASED_OUTPATIENT_CLINIC_OR_DEPARTMENT_OTHER): Payer: Self-pay

## 2024-03-03 ENCOUNTER — Encounter (HOSPITAL_BASED_OUTPATIENT_CLINIC_OR_DEPARTMENT_OTHER): Payer: Self-pay | Admitting: Family

## 2024-03-03 VITALS — BP 128/80 | HR 91 | Ht 64.0 in | Wt 175.5 lb

## 2024-03-03 DIAGNOSIS — E785 Hyperlipidemia, unspecified: Secondary | ICD-10-CM | POA: Diagnosis not present

## 2024-03-03 DIAGNOSIS — Z8249 Family history of ischemic heart disease and other diseases of the circulatory system: Secondary | ICD-10-CM

## 2024-03-03 DIAGNOSIS — I10 Essential (primary) hypertension: Secondary | ICD-10-CM

## 2024-03-03 DIAGNOSIS — Z6830 Body mass index (BMI) 30.0-30.9, adult: Secondary | ICD-10-CM

## 2024-03-03 LAB — BASIC METABOLIC PANEL WITH GFR
BUN/Creatinine Ratio: 16 (ref 12–28)
BUN: 15 mg/dL (ref 8–27)
CO2: 22 mmol/L (ref 20–29)
Calcium: 9.7 mg/dL (ref 8.7–10.3)
Chloride: 102 mmol/L (ref 96–106)
Creatinine, Ser: 0.93 mg/dL (ref 0.57–1.00)
Glucose: 94 mg/dL (ref 70–99)
Potassium: 4.8 mmol/L (ref 3.5–5.2)
Sodium: 141 mmol/L (ref 134–144)
eGFR: 70 mL/min/{1.73_m2} (ref >59)

## 2024-03-03 MED ORDER — METOPROLOL TARTRATE 100 MG PO TABS: ORAL_TABLET | ORAL | 0 refills | Status: DC

## 2024-03-03 NOTE — Progress Notes (Signed)
 Cardiology Office Note:  .   Date:  03/03/2024  ID:  Rebekah [name], DOB [birth year], MRN [id number] PCP: Rebekah Hollow, MD  [facility] Providers Cardiologist:  Sheryle Donning, MD    History of Present Illness: .   Rebekah [name] is a 63 y.o. female with history of hypertension, hyperlipidemia, obesity, prior gastric banding, prediabetes, sleep apnea without CPAP, aortic atherosclerosis.  Family history notable for CAD.  Established with Dr. Veryl [name] 02/05/2023.  She was started on Wegovy .  Presents today for follow up. Weight loss of 20 lbs since last year. Notes she continues to drink soda and feels she would have more significant weight loss if she cut back on soda. Works part time at [employer] and enjoys working in her garden. Reports no shortness of breath nor dyspnea on exertion. Reports no chest pain, pressure, or tightness. No edema, orthopnea, PND. Reports no palpitations.    Reports her mother has had 17 small bowel obstructions. She has had one small bowel obstruction. Last night reports having an "episode" of throwing up and being drenched in sweat. Her husband was concerned about cardiac etiology. Notes regular bowel movements until period of constipation with left lower abdominal pain then in the evening will throw up and have extreme pain then after 4-5 hours something will move and she feels better. Similar episode 04/2023 in ED visit. Last night took Zofran  which did help. This morning had diarrhea. Previously diagnosed with IBS but notes this feels different. Encouraged to follow up with PCP or new GI provider as Dr. Sandrea Cruel recently retired.    ROS: Please see the history of present illness.    All other systems reviewed and are negative.   Studies Reviewed: Aaron Aas   EKG Interpretation Date/Time:  Tuesday Mar 03 2024 07:51:53 EDT Ventricular Rate:  91 PR Interval:  138 QRS Duration:  88 QT Interval:  384 QTC Calculation: 472 R Axis:   4  Text  Interpretation: Normal sinus rhythm  No acute ST/T wave changes Confirmed by Neomi Banks ([id number]) on 03/03/2024 7:56:04 AM        Risk Assessment/Calculations:             Physical Exam:   VS:  BP 128/80   Pulse 91   Ht 5\' 4"  (1.626 m)   Wt 175 lb 8 oz (79.6 kg)   SpO2 96%   BMI 30.12 kg/m    Wt Readings from Last 3 Encounters:  03/03/24 175 lb 8 oz (79.6 kg)  01/29/24 175 lb 6 oz (79.5 kg)  07/31/23 177 lb 2 oz (80.3 kg)    GEN: Well nourished, well developed in no acute distress NECK: No JVD; No carotid bruits CARDIAC: RRR, no murmurs, rubs, gallops RESPIRATORY:  Clear to auscultation without rales, wheezing or rhonchi  ABDOMEN: Soft, non-tender, non-distended EXTREMITIES:  No edema; No deformity   ASSESSMENT AND PLAN: .    HTN - BP well controlled. Continue current antihypertensive regimen hydrochlorothiazide  12.5mg  daily. Discussed to monitor BP at home at least 2 hours after medications and sitting for 5-10 minutes.   HLD, LDL goal <70 / Aortic atherosclerosis - Continue Rosuvastatin  10mg  daily. 07/31/23 LDL 61.   Family history of coronary artery disease - Family history of fatal MI in her brother, mother with CABG, father with CABG, both maternal grandparents with heart disease, paternal grandfather with heart disease. Lp(a) unremarkable. Interested in further evaluation for CAD. Plan for cardiac CTA. Risk factors include aortic atherosclerosis,  obesity, family history, prediabetes. She is aware of use of nitroglycerin during study.  Prediabetes / Obesity - 01/29/24 A1c 5.8. Successful 20 lb weight loss over the last year on Wegovy . Recommend aiming for 150 minutes of moderate intensity activity per week and following a heart healthy diet.   Constipation / Abdominal Pain / IBS - Cyclical episodes of constipation followed by 4-5 hours of vomiting and diarrhea. Most recent episode last night. Bowel sounds present. Recommend follow up with PCP or GI.          Dispo:  follow up in 1 year (sooner follow up will be scheduled if CTA with concerning findings)  Signed, Clearnce Curia, NP

## 2024-03-03 NOTE — Patient Instructions (Signed)
 Medication Instructions:  Your physician recommends that you continue on your current medications as directed. Please refer to the Current Medication list given to you today.   Lab Work: [employer] today   Testing/Procedures: Your cardiac CT will be scheduled at one of the below locations:   [facility] [address] [place], [place] [address] [phone]  OR  [facility]eral[facility] Mom. [facility] Tower [address]  [place], [place] [address] Opening February 17, 2024  If scheduled at [facility], please arrive at the [facility] and Children's Entrance (Entrance C2) of [facility] 30 minutes prior to test start time. You can use the FREE valet parking offered at entrance C (encouraged to control the heart rate for the test)  Proceed to the [facility] Radiology Department (first floor) to check-in and test prep.   All radiology patients and guests should use entrance C2 at [facility], accessed from [facility], even though the hospital's physical address listed is [address]    If scheduled at the Heart and Vascular Tower at [address], please enter the parking lot using the [address] entrance and use the FREE valet service at the patient drop-off area. Enter the buidling and check-in with registration on the main floor.  Please follow these instructions carefully (unless otherwise directed):  An IV will be required for this test and Nitroglycerin will be given.   On the Night Before the Test: Be sure to Drink plenty of water. Do not consume any caffeinated/decaffeinated beverages or chocolate 12 hours prior to your test. Do not take any antihistamines 12 hours prior to your test.  On the Day of the Test: Drink plenty of water until 1 hour prior to the test. Do not eat any food 1 hour prior to test. You may take your regular medications prior to the test.  Take metoprolol (Lopressor)100mg  two hours prior to  test.      After the Test: Drink plenty of water. After receiving IV contrast, you may experience a mild flushed feeling. This is normal. On occasion, you may experience a mild rash up to 24 hours after the test. This is not dangerous. If this occurs, you can take Benadryl  25 mg, Zyrtec, Claritin, or Allegra and increase your fluid intake. (Patients taking Tikosyn should avoid Benadryl , and may take Zyrtec, Claritin, or Allegra) If you experience trouble breathing, this can be serious. If it is severe call 911 IMMEDIATELY. If it is mild, please call our office.  We will call to schedule your test 2-4 weeks out understanding that some insurance companies will need an authorization prior to the service being performed.   For more information and frequently asked questions, please visit our website : [email or web address]  For non-scheduling related questions, please contact the cardiac imaging nurse navigator should you have any questions/concerns: Cardiac Imaging Nurse Navigators Direct Office Dial: [phone]   For scheduling needs, including cancellations and rescheduling, please call [place], [phone].   Follow-Up: Please follow up in 1 year with Dr. Veryl Gottron, Slater Duncan, NP or Neomi Banks, NP   Other Instructions Heart Healthy Diet Recommendations: A low-salt diet is recommended. Meats should be grilled, baked, or boiled. Avoid fried foods. Focus on lean protein sources like fish or chicken with vegetables and fruits. The American Heart Association is a Chief Technology Officer!  American Heart Association Diet and Lifeystyle Recommendations   Exercise recommendations: The American Heart Association recommends 150 minutes of moderate intensity exercise weekly.  Try 30 minutes of moderate intensity exercise 4-5 times per week. This could include walking, jogging, or swimming.

## 2024-03-05 ENCOUNTER — Ambulatory Visit (HOSPITAL_BASED_OUTPATIENT_CLINIC_OR_DEPARTMENT_OTHER): Payer: Self-pay | Admitting: Family

## 2024-03-13 NOTE — Telephone Encounter (Signed)
 Both are fine for CT. Wishing her a speedy recovery after root canal!  Clearnce Curia, NP

## 2024-03-13 NOTE — Telephone Encounter (Signed)
Please advise and review

## 2024-03-20 ENCOUNTER — Encounter (HOSPITAL_COMMUNITY): Payer: Self-pay

## 2024-03-24 ENCOUNTER — Ambulatory Visit (HOSPITAL_COMMUNITY)
Admission: RE | Admit: 2024-03-24 | Discharge: 2024-03-24 | Disposition: A | Discharge status: Home / Self Care | Source: Ambulatory Visit | Attending: Cardiology

## 2024-03-24 ENCOUNTER — Ambulatory Visit (HOSPITAL_COMMUNITY)
Admission: RE | Admit: 2024-03-24 | Discharge: 2024-03-24 | Disposition: A | Discharge status: Home / Self Care | Source: Ambulatory Visit | Attending: Family | Admitting: Family

## 2024-03-24 ENCOUNTER — Other Ambulatory Visit: Payer: Self-pay | Admitting: Cardiology

## 2024-03-24 DIAGNOSIS — Z8249 Family history of ischemic heart disease and other diseases of the circulatory system: Secondary | ICD-10-CM

## 2024-03-24 DIAGNOSIS — R931 Abnormal findings on diagnostic imaging of heart and coronary circulation: Secondary | ICD-10-CM

## 2024-03-24 DIAGNOSIS — E785 Hyperlipidemia, unspecified: Secondary | ICD-10-CM | POA: Insufficient documentation

## 2024-03-24 DIAGNOSIS — I2510 Atherosclerotic heart disease of native coronary artery without angina pectoris: Secondary | ICD-10-CM

## 2024-03-24 MED ORDER — NITROGLYCERIN 0.4 MG SL SUBL
0.8000 mg | SUBLINGUAL_TABLET | Freq: Once | SUBLINGUAL | Status: AC
  Administered 2024-03-24: 0.8 mg via SUBLINGUAL

## 2024-03-24 MED ORDER — NITROGLYCERIN 0.4 MG SL SUBL
SUBLINGUAL_TABLET | SUBLINGUAL | Status: AC
Start: 2024-03-24 — End: ?
  Filled 2024-03-24: qty 2

## 2024-03-24 MED ORDER — IOHEXOL 350 MG/ML SOLN
100.0000 mL | Freq: Once | INTRAVENOUS | Status: AC | PRN
  Administered 2024-03-24: 100 mL via INTRAVENOUS

## 2024-03-26 NOTE — Telephone Encounter (Signed)
 Okay to do in person or video or phone visit, whichever she prefers!  Kaidance Pantoja S Ethlyn Alto, NP

## 2024-03-27 ENCOUNTER — Ambulatory Visit: Payer: Self-pay | Admitting: Cardiology

## 2024-03-27 ENCOUNTER — Other Ambulatory Visit (HOSPITAL_BASED_OUTPATIENT_CLINIC_OR_DEPARTMENT_OTHER): Payer: Self-pay

## 2024-03-27 MED ORDER — METOPROLOL SUCCINATE ER 25 MG PO TB24
25.0000 mg | ORAL_TABLET | Freq: Every day | ORAL | 3 refills | Status: AC
Start: 2024-03-27 — End: ?

## 2024-03-27 NOTE — Addendum Note (Signed)
 Addended by: Guss Legacy on: 03/27/2024 04:14 PM   Modules accepted: Orders

## 2024-03-30 ENCOUNTER — Encounter: Payer: Self-pay | Admitting: Internal Medicine

## 2024-03-31 MED ORDER — DEPLIN 7.5 7.5-90.314 MG PO CAPS: 1.0000 | ORAL_CAPSULE | Freq: Every day | ORAL | 3 refills | Status: AC

## 2024-04-04 ENCOUNTER — Other Ambulatory Visit: Payer: Self-pay | Admitting: Internal Medicine

## 2024-04-04 DIAGNOSIS — I10 Essential (primary) hypertension: Secondary | ICD-10-CM

## 2024-04-20 ENCOUNTER — Telehealth (HOSPITAL_BASED_OUTPATIENT_CLINIC_OR_DEPARTMENT_OTHER): Admitting: Family

## 2024-04-20 VITALS — Ht 64.0 in | Wt 172.0 lb

## 2024-04-20 DIAGNOSIS — I10 Essential (primary) hypertension: Secondary | ICD-10-CM | POA: Diagnosis not present

## 2024-04-20 DIAGNOSIS — E785 Hyperlipidemia, unspecified: Secondary | ICD-10-CM

## 2024-04-20 DIAGNOSIS — Z6829 Body mass index (BMI) 29.0-29.9, adult: Secondary | ICD-10-CM

## 2024-04-20 DIAGNOSIS — I25118 Atherosclerotic heart disease of native coronary artery with other forms of angina pectoris: Secondary | ICD-10-CM

## 2024-04-20 DIAGNOSIS — R7303 Prediabetes: Secondary | ICD-10-CM

## 2024-04-20 MED ORDER — NITROGLYCERIN 0.4 MG SL SUBL: 0.4000 mg | SUBLINGUAL_TABLET | SUBLINGUAL | 3 refills | Status: AC | PRN

## 2024-04-20 NOTE — Patient Instructions (Addendum)
 Medication Instructions:   START Nitroglycerin  as needed for chest pain  For as needed Nitroglycerin , if you develop chest pain: Sit and rest 5 minutes. If chest pain does not resolve place 1 nitroglycerin  under your tongue and wait 5 minutes. If chest pain does not resolve, place a 2nd nitroglycerin  under your tongue and wait 5 more minutes. If chest pain does not resolve, place a 3rd nitroglycerin  under your tongue and seek emergency services.   *If you need a refill on your cardiac medications before your next appointment, please call your pharmacy*  Testing/Procedures:  Your cardiac CTA showed: Coronary calcium  score is 1241, which places the patient in the 99th percentile for age and sex matched control. You have 4 main heart arteries: Left Main  < 25% plaque Left Anterior Descending <25% plaque in proximal vessel and 25-49% in mid vessel Left Circumflex <25% plaque Right Coronary with 25-49% plaque in the proximal vessel and 50-69% in the mid vessel. This is your 'dominant' heart artery so it would be a larger vessel.   You have some little side branches. One of these called your 'first obtuse marginal' had <25% plaque    The second part of the CT scan measures the flow of the blood through the arteries regardless of the amount of plaque. A normal blood flow through the artery would be FFR measurement more than 0.75.   1. Left Main: FFR = 1.0 2. LAD: Proximal FFR = 0.96, Mid FFR = 0.90, Distal FFR = 0.82 3. LCX: Proximal FFR = 0.98, Distal FFR = 0.94 4. OM1: Proximal FFR = 0.96, Distal FFR = 0.88 5. RCA: Proximal FFR = 1.0, Mid FFR =0.92, Distal FFR = 0.89  All your flow measurements were normal which is great! It tells us  there is plaque in the heart arteries but your heart muscle is still getting good blood flow so we can treat with medical therapy. Medical therapy detailed below with the A, B, Cs.  Follow-Up: At [facility], you and your health needs are our  priority.  As part of our continuing mission to provide you with exceptional heart care, our providers are all part of one team.  This team includes your primary Cardiologist (physician) and Advanced Practice Providers or APPs (Physician Assistants and Nurse Practitioners) who all work together to provide you with the care you need, when you need it.  Your next appointment:   May 2026 - we will reach out closer to time to schedule  We recommend signing up for the patient portal called MyChart.  Sign up information is provided on this After Visit Summary.  MyChart is used to connect with patients for Virtual Visits (Telemedicine).  Patients are able to view lab/test results, encounter notes, upcoming appointments, etc.  Non-urgent messages can be sent to your provider as well.   To learn more about what you can do with MyChart, go to [email or web address].   Other Instructions  Recommend calling gastroenterology [phone] to schedule an appointment. Dr. Leigh, Dr. Claretha, Dr. Avram, Dr. Shila, Dr. Albertus are all great as are their NP & PAs.  For coronary artery disease often called heart disease we aim for optimal guideline directed medical therapy. We use the A, B, Cs to help keep us  on track!  A = Aspirin  81mg  daily B = Beta blocker which helps to relax the heart. This is your Metoprolol . C = Cholesterol control. You take Rosuvastatin  to help control your cholesterol. We want your LDL (bad cholesterol)  to be less than 70 and when last checked it was 61. D = Don't forget nitroglycerin ! This is an emergency tablet to be used if you have chest pain. E = Extras. In your case, this is Jardiance  which helps to further lower your cardiovascular risk.  Heart Healthy Diet Recommendations: A low-salt diet is recommended. Meats should be grilled, baked, or boiled. Avoid fried foods. Focus on lean protein sources like fish or chicken with vegetables and fruits. The American Heart  Association is a Chief Technology Officer!  American Heart Association Diet and Lifeystyle Recommendations    Exercise recommendations: The American Heart Association recommends 150 minutes of moderate intensity exercise weekly. Try 30 minutes of moderate intensity exercise 4-5 times per week. This could include walking, jogging, or swimming.  If you want a referral to PREP exercise program at [facility], let us  know! You can also try [employer] on Youtube - she has a lot of great videos!

## 2024-04-20 NOTE — Progress Notes (Unsigned)
 Virtual Visit via Telephone Note   Because of Ivanell O Grulke's co-morbid illnesses, she is at least at moderate risk for complications without adequate follow up.  This format is felt to be most appropriate for this patient at this time.  The patient did not have access to video technology/had technical difficulties with video requiring transitioning to audio format only (telephone).  All issues noted in this document were discussed and addressed.  No physical exam could be performed with this format.  Please refer to the patient's chart for her consent to telehealth for [facility].   Date:  04/20/2024   ID:  ALLEYNE LAC, DOB 02-Nov-1960, MRN [id number] The patient was identified using 2 identifiers.  Patient Location: Home Provider Location: Office/Clinic   PCP:  Amon Aloysius BRAVO, MD   [facility] Providers Cardiologist:  Shelda Bruckner, MD { Click to update primary MD,subspecialty MD or APP then REFRESH:1}    Evaluation Performed:  Follow-Up Visit  Chief Complaint:  follow up after cardiac CTA  History of Present Illness:    Bijou BETHENE HANKINSON is a 63 y.o. female with history of hypertension, hyperlipidemia, obesity, prior gastric banding, prediabetes, sleep apnea without CPAP, aortic atherosclerosis.  Family history notable for CAD.   Established with Dr. Bruckner 02/05/2023.  She was started on Wegovy .   Presents today for follow up. Weight loss of 20 lbs since last year. Notes she continues to drink soda and feels she would have more significant weight loss if she cut back on soda. Works part time at [employer] and enjoys working in her garden. Reports no shortness of breath nor dyspnea on exertion. Reports no chest pain, pressure, or tightness. No edema, orthopnea, PND. Reports no palpitations.     Reports her mother has had 17 small bowel obstructions. She has had one small bowel obstruction. Last night reports having an episode of throwing up and  being drenched in sweat. Her husband was concerned about cardiac etiology. Notes regular bowel movements until period of constipation with left lower abdominal pain then in the evening will throw up and have extreme pain then after 4-5 hours something will move and she feels better. Similar episode 04/2023 in ED visit. Last night took Zofran  which did help. This morning had diarrhea. Previously diagnosed with IBS but notes this feels different. Encouraged to follow up with PCP or new GI provider as Dr. Aneita recently retired. ***  *** Left lower quadrant  Past Medical History:  Diagnosis Date   Allergy    Arthritis    Back pain    Bowel obstruction (HCC) 2019   Depression    Gall bladder stones    GERD (gastroesophageal reflux disease)    Hyperlipidemia    Hypertension    IBS (irritable bowel syndrome)    IBS (irritable bowel syndrome)    Joint pain    Obesity    Lap band surgery 12-08 Dr Gladis   OSA (obstructive sleep apnea)    using a CPAP     Pre-diabetes    REDUCTION MAMMOPLASTY, HX OF 09/22/2007   Qualifier: Diagnosis of  By: Amon MD, Aloysius BRAVO.    Sleep apnea    doesn't use CPAP   Past Surgical History:  Procedure Laterality Date   APPENDECTOMY     BREAST REDUCTION SURGERY     CHOLECYSTECTOMY     CLOSED REDUCTION FINGER WITH PERCUTANEOUS PINNING Left 09/14/2014   Procedure: CLOSED REDUCTION PERCUTANEOUS PINNING ;  Surgeon:  Franky Curia, MD;  Location: [facility];  Service: Orthopedics;  Laterality: Left;   COLONOSCOPY     ESOPHAGEAL MANOMETRY N/A 09/23/2017   Procedure: ESOPHAGEAL MANOMETRY (EM);  Surgeon: Shila Gustav GAILS, MD;  Location: [facility];  Service: Endoscopy;  Laterality: N/A;   Lap Band Adjustment  09/01/2015   Dr. Tanda   LAPAROSCOPIC GASTRIC BANDING  09/22/2007   Lap band surgery 12-08 Dr Gladis   NASAL SINUS SURGERY     TONSILLECTOMY     x2   UPPER GASTROINTESTINAL ENDOSCOPY       Current Meds  Medication Sig   alendronate   (FOSAMAX ) 70 MG tablet Take 1 tablet (70 mg total) by mouth every 7 (seven) days. Take with a full glass of water on an empty stomach.   aspirin  EC 81 MG tablet Take 81 mg by mouth daily. Swallow whole.   empagliflozin  (JARDIANCE ) 10 MG TABS tablet Take 1 tablet (10 mg total) by mouth daily before breakfast.   Estradiol  (DIVIGEL ) 1 MG/GM GEL Place 1 mg onto the skin daily.   hydrochlorothiazide  (MICROZIDE ) 12.5 MG capsule Take 1 capsule (12.5 mg total) by mouth daily.   L-Methylfolate-Algae (DEPLIN 7.5) 7.5-90.314 MG CAPS Take 1 tablet by mouth daily.   metoprolol  succinate (TOPROL  XL) 25 MG 24 hr tablet Take 1 tablet (25 mg total) by mouth daily.   nortriptyline  (PAMELOR ) 75 MG capsule Take 1 capsule (75 mg total) by mouth daily.   omeprazole  (PRILOSEC) 40 MG capsule Take 1 capsule (40 mg total) by mouth daily.   ondansetron  (ZOFRAN -ODT) 4 MG disintegrating tablet Take 1 tablet (4 mg total) by mouth every 8 (eight) hours as needed for nausea or vomiting.   Probiotic Product (PROBIOTIC-10) CHEW Chew 1 tablet by mouth daily.    progesterone (PROMETRIUM) 100 MG capsule Take 100 mg by mouth daily.   rosuvastatin  (CRESTOR ) 10 MG tablet Take 1 tablet (10 mg total) by mouth daily.   Semaglutide -Weight Management 2.4 MG/0.75ML SOAJ Inject 2.4 mg into the skin once a week.     Allergies:   Penicillins, Metformin  and related, and Erythromycin   Social History   Tobacco Use   Smoking status: Never   Smokeless tobacco: Never  Vaping Use   Vaping status: Never Used  Substance Use Topics   Alcohol use: Yes    Comment: socially    Drug use: No     Family Hx: The patient's family history includes Breast cancer in her mother; Coronary artery disease in her father; Depression in her father and mother; Diverticulitis in her mother; Heart attack in her brother; Heart disease in her mother; High Cholesterol in her father and mother; High blood pressure in her father and mother; Obesity in her father and  mother; Prostate cancer in her father; Pulmonary fibrosis in her father; Thyroid  disease in her mother. There is no history of Diabetes, Colon cancer, Stomach cancer, Esophageal cancer, or Rectal cancer.  ROS:   Please see the history of present illness.    *** All other systems reviewed and are negative.   Prior CV studies:   The following studies were reviewed today:  ***  Labs/Other Tests and Data Reviewed:    EKG:  {EKG/Telemetry Strips Reviewed:[id number]}  Recent Labs: 05/02/2023: ALT 35 01/29/2024: Hemoglobin 15.3; Platelets 318.0 03/03/2024: BUN 15; Creatinine, Ser 0.93; Potassium 4.8; Sodium 141   Recent Lipid Panel Lab Results  Component Value Date/Time   CHOL 127 07/31/2023 09:58 AM   CHOL 149 10/25/2021 08:43  AM   TRIG 97.0 07/31/2023 09:58 AM   TRIG 99 08/30/2006 09:04 AM   HDL 46.30 07/31/2023 09:58 AM   HDL 48 10/25/2021 08:43 AM   CHOLHDL 3 07/31/2023 09:58 AM   LDLCALC 61 07/31/2023 09:58 AM   LDLCALC 85 10/25/2021 08:43 AM    Wt Readings from Last 3 Encounters:  04/20/24 172 lb (78 kg)  03/03/24 175 lb 8 oz (79.6 kg)  01/29/24 175 lb 6 oz (79.5 kg)     Risk Assessment/Calculations:          Objective:    Vital Signs:  Ht 5' 4 (1.626 m)   Wt 172 lb (78 kg)   BMI 29.52 kg/m    VITAL SIGNS:  reviewed  ASSESSMENT & PLAN:    HTN - BP well controlled. Continue current antihypertensive regimen hydrochlorothiazide  12.5mg  daily. Discussed to monitor BP at home at least 2 hours after medications and sitting for 5-10 minutes.   HLD, LDL goal <70 / Aortic atherosclerosis - Continue Rosuvastatin  10mg  daily. 07/31/23 LDL 61.   Family history of coronary artery disease - Family history of fatal MI in her brother, mother with CABG, father with CABG, both maternal grandparents with heart disease, paternal grandfather with heart disease. Lp(a) unremarkable. Interested in further evaluation for CAD. Plan for cardiac CTA. Risk factors include aortic  atherosclerosis, obesity, family history, prediabetes. She is aware of use of nitroglycerin  during study.  Prediabetes / Obesity - 01/29/24 A1c 5.8. Successful 20 lb weight loss over the last year on Wegovy . Recommend aiming for 150 minutes of moderate intensity activity per week and following a heart healthy diet.   Constipation / Abdominal Pain / IBS - Cyclical episodes of constipation followed by 4-5 hours of vomiting and diarrhea. Most recent episode last night. Bowel sounds present. Recommend follow up with PCP or GI.            Time:   Today, I have spent *** minutes with the patient with telehealth technology discussing the above problems.     Medication Adjustments/Labs and Tests Ordered: Current medicines are reviewed at length with the patient today.  Concerns regarding medicines are outlined above.   Tests Ordered: No orders of the defined types were placed in this encounter.   Medication Changes: No orders of the defined types were placed in this encounter.   Follow Up:  {F/U Format:[id number]} in {NUMBERS; [id number]:66861} {TIME FRAME:9076}  Signed, Reche GORMAN Finder, NP  04/20/2024 2:59 PM    [facility]

## 2024-05-19 ENCOUNTER — Ambulatory Visit (INDEPENDENT_AMBULATORY_CARE_PROVIDER_SITE_OTHER): Admitting: Family

## 2024-05-19 ENCOUNTER — Encounter: Payer: Self-pay | Admitting: Family

## 2024-05-19 DIAGNOSIS — M25562 Pain in left knee: Secondary | ICD-10-CM | POA: Diagnosis not present

## 2024-05-19 DIAGNOSIS — M1712 Unilateral primary osteoarthritis, left knee: Secondary | ICD-10-CM

## 2024-05-27 DIAGNOSIS — M25562 Pain in left knee: Secondary | ICD-10-CM | POA: Diagnosis not present

## 2024-05-27 DIAGNOSIS — M1712 Unilateral primary osteoarthritis, left knee: Secondary | ICD-10-CM | POA: Diagnosis not present

## 2024-05-27 MED ORDER — METHYLPREDNISOLONE ACETATE 40 MG/ML IJ SUSP
40.0000 mg | INTRAMUSCULAR | Status: AC | PRN
  Administered 2024-05-27: 40 mg via INTRA_ARTICULAR

## 2024-05-27 MED ORDER — LIDOCAINE HCL 1 % IJ SOLN
5.0000 mL | INTRAMUSCULAR | Status: AC | PRN
  Administered 2024-05-27: 5 mL

## 2024-05-27 NOTE — Progress Notes (Signed)
 Office Visit Note   Patient: Rebekah [name]           Date of Birth: 06-Jul-1961           MRN: [id number] Visit Date: 05/19/2024              Requested by: Amon Aloysius BRAVO, MD [address] [place],  [place] [address] PCP: Amon Aloysius BRAVO, MD  Chief Complaint  Patient presents with   Left Knee - Pain    S/p injection 07/25/2023      HPI: The patient is a 63 year old woman who presents today for ongoing left knee pain with minimal improvement this has been ongoing for over a year now.  At the time of onset this came on suddenly she could not recall any specific injury but began having significant medial knee pain which has hindered her ability to complete her ADL   she has stiffness pain with deep flexion.  She has been having some night pain difficulty getting comfortable waking from sleep due to her knee pain.  She has pain with her knee fully extended denies any locking catching or giving way  Has had Depo-Medrol  injections which have provided her with modest relief she last had an injection in October of last year  Assessment & Plan: Visit Diagnoses: No diagnosis found.  Plan: We will proceed with MRI today to evaluate for any meniscal injury as she continues to have significant pain acutely last July  Follow-Up Instructions: No follow-ups on file.   Ortho Exam  Patient is alert, oriented, no adenopathy, well-dressed, normal affect, normal respiratory effort.  On examination right knee she is having medial joint line tenderness.  There is full active range of motion present.  No crepitation her collaterals and cruciates are stable.  There is no effusion.   Imaging: No results found. No images are attached to the encounter.  Labs: Lab Results  Component Value Date   HGBA1C 5.8 01/29/2024   HGBA1C 6.0 07/31/2023   HGBA1C 6.4 02/25/2023   ESRSEDRATE 18 06/14/2016   ESRSEDRATE 31 (H) 06/20/2011   LABORGA Insignificant Growth 07/04/2012     Lab Results   Component Value Date   ALBUMIN 4.0 05/02/2023   ALBUMIN 4.5 07/27/2022   ALBUMIN 4.3 01/29/2022    No results found for: MG Lab Results  Component Value Date   VD25OH 54.3 10/25/2021   VD25OH 61.8 03/08/2021   VD25OH 44.0 09/29/2020    No results found for: PREALBUMIN    Latest Ref Rng & Units 01/29/2024    9:17 AM 05/02/2023   11:35 PM 05/02/2023    8:48 PM  CBC EXTENDED  WBC 4.0 - 10.5 K/uL 8.6   15.8   RBC 3.87 - 5.11 Mil/uL 5.23   5.68   Hemoglobin 12.0 - 15.0 g/dL 84.6  84.9  84.4   HCT 36.0 - 46.0 % 46.8  44.0  48.5   Platelets 150.0 - 400.0 K/uL 318.0   337   NEUT# 1.4 - 7.7 K/uL 4.9   12.9   Lymph# 0.7 - 4.0 K/uL 3.0   2.1      There is no height or weight on file to calculate BMI.  Orders:  No orders of the defined types were placed in this encounter.  No orders of the defined types were placed in this encounter.    Procedures: Large Joint Inj: L knee on 05/27/2024 6:18 AM Indications: pain Details: 18 G 1.5 in  needle, anteromedial approach Medications: 5 mL lidocaine  1 %; 40 mg methylPREDNISolone  acetate 40 MG/ML Consent was given by the patient.      Clinical Data: No additional findings.  ROS:  All other systems negative, except as noted in the HPI. Review of Systems  Objective: Vital Signs: There were no vitals taken for this visit.  Specialty Comments:  No specialty comments available.  PMFS History: Patient Active Problem List   Diagnosis Date Noted   Atypical glandular cells on cervical Pap smear 07/26/2022   Osteopenia 01/29/2022   HGSIL (high grade squamous intraepithelial lesion) on Pap smear of cervix 07/12/2021   Metabolic syndrome 01/10/2020   Morbid obesity (HCC) 01/10/2020   Migraines 11/13/2019   Hyperglycemia, unspecified 07/21/2018   SBO (small bowel obstruction) (HCC) 09/28/2017   Dysphagia    PCP NOTES >>>> 09/25/2015   Annual physical exam 09/05/2011   GERD 10/12/2009   Hyperlipidemia 09/22/2007   Anxiety and  depression 09/22/2007   Essential hypertension 09/22/2007   Seasonal and perennial allergic rhinitis 09/22/2007   Obstructive sleep apnea 09/22/2007   Past Medical History:  Diagnosis Date   Allergy    Arthritis    Back pain    Bowel obstruction (HCC) 2019   Depression    Gall bladder stones    GERD (gastroesophageal reflux disease)    Hyperlipidemia    Hypertension    IBS (irritable bowel syndrome)    IBS (irritable bowel syndrome)    Joint pain    Obesity    Lap band surgery 12-08 Dr Gladis   OSA (obstructive sleep apnea)    using a CPAP     Pre-diabetes    REDUCTION MAMMOPLASTY, HX OF 09/22/2007   Qualifier: Diagnosis of  By: Amon MD, Aloysius DRAFTS    Sleep apnea    doesn't use CPAP    Family History  Problem Relation Age of Onset   Breast cancer Mother        age 68   Heart disease Mother        CABG, smoker .age onset 78s   Diverticulitis Mother    High blood pressure Mother    High Cholesterol Mother    Thyroid  disease Mother    Depression Mother    Obesity Mother    Coronary artery disease Father        CABG, smoker .age onset 10s   Prostate cancer Father    Pulmonary fibrosis Father        and others   High blood pressure Father    High Cholesterol Father    Depression Father    Obesity Father    Heart attack Brother        age 5   Diabetes Neg Hx    Colon cancer Neg Hx    Stomach cancer Neg Hx    Esophageal cancer Neg Hx    Rectal cancer Neg Hx     Past Surgical History:  Procedure Laterality Date   APPENDECTOMY     BREAST REDUCTION SURGERY     CHOLECYSTECTOMY     CLOSED REDUCTION FINGER WITH PERCUTANEOUS PINNING Left 09/14/2014   Procedure: CLOSED REDUCTION PERCUTANEOUS PINNING ;  Surgeon: Franky Curia, MD;  Location: [facility];  Service: Orthopedics;  Laterality: Left;   COLONOSCOPY     ESOPHAGEAL MANOMETRY N/A 09/23/2017   Procedure: ESOPHAGEAL MANOMETRY (EM);  Surgeon: Shila Gustav GAILS, MD;  Location: [facility];  Service:  Endoscopy;  Laterality: N/A;   Lap Band  Adjustment  09/01/2015   Dr. Tanda   LAPAROSCOPIC GASTRIC BANDING  09/22/2007   Lap band surgery 12-08 Dr Gladis   NASAL SINUS SURGERY     TONSILLECTOMY     x2   UPPER GASTROINTESTINAL ENDOSCOPY     Social History   Occupational History   Occupation: RETIRED--[employer]- works from home    Comment: retired 2022  Tobacco Use   Smoking status: Never   Smokeless tobacco: Never  Vaping Use   Vaping status: Never Used  Substance and Sexual Activity   Alcohol use: Yes    Comment: socially    Drug use: No   Sexual activity: Yes    Partners: Male    Birth control/protection: None, Post-menopausal

## 2024-05-30 ENCOUNTER — Ambulatory Visit
Admission: RE | Admit: 2024-05-30 | Discharge: 2024-05-30 | Disposition: A | Discharge status: Home / Self Care | Source: Ambulatory Visit | Attending: Family | Admitting: Family

## 2024-05-30 DIAGNOSIS — M1712 Unilateral primary osteoarthritis, left knee: Secondary | ICD-10-CM

## 2024-05-30 DIAGNOSIS — M25562 Pain in left knee: Secondary | ICD-10-CM

## 2024-06-08 ENCOUNTER — Other Ambulatory Visit: Payer: Self-pay | Admitting: Internal Medicine

## 2024-06-09 ENCOUNTER — Other Ambulatory Visit (HOSPITAL_COMMUNITY): Payer: Self-pay

## 2024-06-09 ENCOUNTER — Other Ambulatory Visit: Payer: Self-pay

## 2024-06-09 ENCOUNTER — Other Ambulatory Visit (HOSPITAL_BASED_OUTPATIENT_CLINIC_OR_DEPARTMENT_OTHER): Payer: Self-pay

## 2024-06-10 ENCOUNTER — Ambulatory Visit (INDEPENDENT_AMBULATORY_CARE_PROVIDER_SITE_OTHER)
Admission: RE | Admit: 2024-06-10 | Discharge: 2024-06-10 | Disposition: A | Discharge status: Home / Self Care | Source: Ambulatory Visit | Attending: Gastroenterology

## 2024-06-10 ENCOUNTER — Ambulatory Visit (INDEPENDENT_AMBULATORY_CARE_PROVIDER_SITE_OTHER): Admitting: Gastroenterology

## 2024-06-10 ENCOUNTER — Encounter: Payer: Self-pay | Admitting: Gastroenterology

## 2024-06-10 ENCOUNTER — Ambulatory Visit: Admitting: Family

## 2024-06-10 VITALS — BP 104/72 | HR 77 | Ht 64.0 in | Wt 174.1 lb

## 2024-06-10 DIAGNOSIS — K219 Gastro-esophageal reflux disease without esophagitis: Secondary | ICD-10-CM

## 2024-06-10 DIAGNOSIS — R194 Change in bowel habit: Secondary | ICD-10-CM

## 2024-06-10 DIAGNOSIS — K5904 Chronic idiopathic constipation: Secondary | ICD-10-CM

## 2024-06-10 NOTE — Progress Notes (Signed)
 Chief Complaint: abdominal pain, altered bowel habits, nausea  Primary GI Doctor: (previously Dr. Aneita) Dr. San  HPI:  Patient is a  63  year old female patient with past medical history of GERD, hyperlipidemia, hypertension, and pre diabetes, who presents for a evaluation of abdominal pain, altered bowel habits, and nausea.   Patient last seen in [facility] on 09/11/22 by Dr. Aneita for colonoscopy with history of colon polyps.  Interval History    Patient presents for evaluation of abdominal pain, altered bowel habits with nausea. She reports she has had these issues intermittently most of her life. Patient will have episodes where she will go 7-8 days without bowel movement followed by multiple episodes of diarrhea. She will then take antidiarrheal to stop the diarrhea. Patient reports she has tried OTC Miralax for constipation in the past without much result. No blood in stool.  Patient will have associated symptoms of abdominal pain, nausea, and fever.   Patient has history of GERD and taking omeprazole  40mg  po daily. Patient reports her symptoms are controlled.   Patient taking Semaglutide  and been on it for a year. She has tolerated it well. Her appetite has decreased and so she admits consuming enough fiber is a challenge.   Surgical history: appendectomy, gallbladder, and lap band  Patient's family history includes: mother with colon CA (age 77's)  GI procedures: 08/2022 colonoscopy, recall 10 years - The entire examined colon is normal on direct and retroflexion views. - No specimens collected.  08/2017 EGD - Dilation in the proximal esophagus, in the mid esophagus and in the distal esophagus. - Esophageal mucosal changes were present, including congestion ( edema) and erosions. Findings are suggestive of inflammation due to stasis. - Fluid in the proximal esophagus and in the mid esophagus. - Gastritis. Biopsied. 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 - Normal duodenal bulb and second portion of the duodenum.  08/2017 colonoscopy  - Internal hemorrhoids. - The examination was otherwise normal on direct and retroflexion views. - No specimens collected.  08/2012 EGD Small HH. S/p lap band  08/2012 colonoscopy SSP measuring 6mm in transverse colon.  The colon was otherwise normal.  Imaging: CTAP 04/2023 IMPRESSION: No acute findings in the abdomen or pelvis. Fatty infiltration of the liver. Left band device in expected position. Aortic atherosclerosis.  UGI series 10/2017: 1. Gastric lap band in place. No obstruction, but conspicuous narrowing of the lumen at the lap band despite absence of fluid from the band at this time. 2. Patulous and dilated thoracic esophagus with decreased esophageal motility and frequent tertiary contractions. 3. Prompt gastric emptying. Normal duodenum and visible proximal small bowel.   Wt Readings from Last 3 Encounters:  06/10/24 174 lb 2 oz (79 kg)  04/20/24 172 lb (78 kg)  03/03/24 175 lb 8 oz (79.6 kg)    Past Medical History:  Diagnosis Date   Allergy    Arthritis    Back pain    Bowel obstruction (HCC) 2019   Depression    Gall bladder stones    GERD (gastroesophageal reflux disease)    Hyperlipidemia    Hypertension    IBS (irritable bowel syndrome)    IBS (irritable bowel syndrome)    Joint pain    Obesity    Lap band surgery 12-08 Dr Gladis   OSA (obstructive sleep apnea)    using a CPAP     Pre-diabetes    REDUCTION MAMMOPLASTY, HX OF 09/22/2007  Qualifier: Diagnosis of  By: Amon MD, Aloysius BRAVO.    Sleep apnea    doesn't use CPAP    Past Surgical History:  Procedure Laterality Date   APPENDECTOMY     BREAST REDUCTION SURGERY     CHOLECYSTECTOMY     CLOSED REDUCTION FINGER WITH PERCUTANEOUS PINNING Left 09/14/2014   Procedure: CLOSED REDUCTION PERCUTANEOUS PINNING ;  Surgeon: Franky Curia, MD;  Location: [facility];  Service: Orthopedics;  Laterality:  Left;   COLONOSCOPY     ESOPHAGEAL MANOMETRY N/A 09/23/2017   Procedure: ESOPHAGEAL MANOMETRY (EM);  Surgeon: Shila Gustav GAILS, MD;  Location: [facility];  Service: Endoscopy;  Laterality: N/A;   Lap Band Adjustment  09/01/2015   Dr. Tanda   LAPAROSCOPIC GASTRIC BANDING  09/22/2007   Lap band surgery 12-08 Dr Gladis   NASAL SINUS SURGERY     TONSILLECTOMY     x2   UPPER GASTROINTESTINAL ENDOSCOPY      Current Outpatient Medications  Medication Sig Dispense Refill   progesterone (PROMETRIUM) 100 MG capsule Take 100 mg by mouth at bedtime.     alendronate  (FOSAMAX ) 70 MG tablet Take 1 tablet (70 mg total) by mouth every 7 (seven) days. Take with a full glass of water on an empty stomach. 12 tablet 3   aspirin  EC 81 MG tablet Take 81 mg by mouth daily. Swallow whole.     empagliflozin  (JARDIANCE ) 10 MG TABS tablet Take 1 tablet (10 mg total) by mouth daily before breakfast. 90 tablet 1   Estradiol  (DIVIGEL ) 1 MG/GM GEL Place 1 mg onto the skin daily.     hydrochlorothiazide  (MICROZIDE ) 12.5 MG capsule Take 1 capsule (12.5 mg total) by mouth daily. 90 capsule 1   L-Methylfolate-Algae (DEPLIN 7.5) 7.5-90.314 MG CAPS Take 1 tablet by mouth daily. 90 capsule 3   metoprolol  succinate (TOPROL  XL) 25 MG 24 hr tablet Take 1 tablet (25 mg total) by mouth daily. 90 tablet 3   nitroGLYCERIN  (NITROSTAT ) 0.4 MG SL tablet Place 1 tablet (0.4 mg total) under the tongue every 5 (five) minutes as needed for chest pain. 25 tablet 3   nortriptyline  (PAMELOR ) 75 MG capsule Take 1 capsule (75 mg total) by mouth daily. 90 capsule 1   omeprazole  (PRILOSEC) 40 MG capsule Take 1 capsule (40 mg total) by mouth daily. 90 capsule 1   ondansetron  (ZOFRAN -ODT) 4 MG disintegrating tablet Take 1 tablet (4 mg total) by mouth every 8 (eight) hours as needed for nausea or vomiting. 20 tablet 3   Probiotic Product (PROBIOTIC-10) CHEW Chew 1 tablet by mouth daily.      rosuvastatin  (CRESTOR ) 10 MG tablet Take 1 tablet  (10 mg total) by mouth daily. 90 tablet 1   Semaglutide -Weight Management 2.4 MG/0.75ML SOAJ Inject 2.4 mg into the skin once a week. 3 mL 11   No current facility-administered medications for this visit.    Allergies as of 06/10/2024 - Review Complete 06/10/2024  Allergen Reaction Noted   Penicillins Hives and Rash 09/22/2007   Metformin  and related  08/08/2022   Erythromycin Nausea Only 07/31/2011    Family History  Problem Relation Age of Onset   Breast cancer Mother        age 20   Heart disease Mother        CABG, smoker .age onset 24s   Diverticulitis Mother    High blood pressure Mother    High Cholesterol Mother    Thyroid  disease Mother  Depression Mother    Obesity Mother    Coronary artery disease Father        CABG, smoker .age onset 64s   Prostate cancer Father    Pulmonary fibrosis Father        and others   High blood pressure Father    High Cholesterol Father    Depression Father    Obesity Father    Heart attack Brother        age 78   Diabetes Neg Hx    Colon cancer Neg Hx    Stomach cancer Neg Hx    Esophageal cancer Neg Hx    Rectal cancer Neg Hx     Review of Systems:    Constitutional: No weight loss, fever, chills, weakness or fatigue HEENT: Eyes: No change in vision               Ears, Nose, Throat:  No change in hearing or congestion Skin: No rash or itching Cardiovascular: No chest pain, chest pressure or palpitations   Respiratory: No SOB or cough Gastrointestinal: See HPI and otherwise negative Genitourinary: No dysuria or change in urinary frequency Neurological: No headache, dizziness or syncope Musculoskeletal: No new muscle or joint pain Hematologic: No bleeding or bruising Psychiatric: No history of depression or anxiety    Physical Exam:  Vital signs: BP 104/72 (BP Location: Right Arm, Patient Position: Sitting, Cuff Size: Normal)   Pulse 77   Ht 5' 4 (1.626 m)   Wt 174 lb 2 oz (79 kg)   BMI 29.89 kg/m    Constitutional:   Pleasant female appears to be in NAD, Well developed, Well nourished, alert and cooperative Throat: Oral cavity and pharynx without inflammation, swelling or lesion.  Respiratory: Respirations even and unlabored. Lungs clear to auscultation bilaterally.   No wheezes, crackles, or rhonchi.  Cardiovascular: Normal S1, S2. Regular rate and rhythm. No peripheral edema, cyanosis or pallor.  Gastrointestinal:  Soft, nondistended, nontender. No rebound or guarding. Normal bowel sounds. No appreciable masses or hepatomegaly. Rectal:  Not performed.  Msk:  Symmetrical without gross deformities. Without edema, no deformity or joint abnormality.  Neurologic:  Alert and  oriented x4;  grossly normal neurologically.  Skin:   Dry and intact without significant lesions or rashes.  RELEVANT LABS AND IMAGING: CBC    Latest Ref Rng & Units 01/29/2024    9:17 AM 05/02/2023   11:35 PM 05/02/2023    8:48 PM  CBC  WBC 4.0 - 10.5 K/uL 8.6   15.8   Hemoglobin 12.0 - 15.0 g/dL 84.6  84.9  84.4   Hematocrit 36.0 - 46.0 % 46.8  44.0  48.5   Platelets 150.0 - 400.0 K/uL 318.0   337      CMP     Latest Ref Rng & Units 03/03/2024    8:38 AM 01/29/2024    9:17 AM 07/31/2023    9:58 AM  CMP  Glucose 70 - 99 mg/dL 94  80  76   BUN 8 - 27 mg/dL 15  11  12    Creatinine 0.57 - 1.00 mg/dL 9.06  9.03  9.14   Sodium 134 - 144 mmol/L 141  140  139   Potassium 3.5 - 5.2 mmol/L 4.8  4.0  4.0   Chloride 96 - 106 mmol/L 102  102  100   CO2 20 - 29 mmol/L 22  29  29    Calcium  8.7 - 10.3 mg/dL 9.7  9.6  9.4  Lab Results  Component Value Date   TSH 2.83 07/27/2022     Assessment: Encounter Diagnoses  Name Primary?   Gastroesophageal reflux disease, unspecified whether esophagitis present Yes   Altered bowel habits    Chronic idiopathic constipation     63 year old female patient with altered bowel habits, abd pain,and nausea. CT scan 04/2023 negative. Last colonoscopy 11/23 were normal. Her  symptoms are most consistent with obstipation overflow. Will order abd xray 2 view today and start her on samples of pro secretory agent Linzess. Advise she stop the OTC imodium which is counterproductive for the constipation. She can add daily fiber supplementation. We also briefly discussed how GLP 1 can slow down motility of the gut, advised small meals and no late meals.   Plan: - Order abd xray 2 view  - samples of Linzess 72mcg po daily  -start Citrucel 1 tsp po daily  Thank you for the courtesy of this consult. Please call me with any questions or concerns.   Onelia Cadmus, FNP-C [facility] 06/10/2024, 12:53 PM  Cc: Amon Aloysius BRAVO, MD

## 2024-06-10 NOTE — Patient Instructions (Addendum)
 Recommend high fiber diet OTC Citrucel 1 tsp po daily Samples of Linzess 72 mcg po daily, take 1 tablet 30-45 minutes before first meal of day with full glass of water   Your provider has requested that you have an abdominal x ray before leaving today. Please go to the basement floor to our Radiology department for the test.   _______________________________________________________  If your blood pressure at your visit was 140/90 or greater, please contact your primary care physician to follow up on this.  _______________________________________________________  If you are age 105 or older, your body mass index should be between 23-30. Your Body mass index is 29.89 kg/m. If this is out of the aforementioned range listed, please consider follow up with your Primary Care Provider.  If you are age 5 or younger, your body mass index should be between 19-25. Your Body mass index is 29.89 kg/m. If this is out of the aformentioned range listed, please consider follow up with your Primary Care Provider.   ________________________________________________________  The [facility] providers would like to encourage you to use MYCHART to communicate with providers for non-urgent requests or questions.  Due to long hold times on the telephone, sending your provider a message by [facility] may be a faster and more efficient way to get a response.  Please allow 48 business hours for a response.  Please remember that this is for non-urgent requests.  _______________________________________________________  [facility] is using a team-based approach to care.  Your team is made up of your doctor and two to three APPS. Our APPS (Nurse Practitioners and Physician Assistants) work with your physician to ensure care continuity for you. They are fully qualified to address your health concerns and develop a treatment plan. They communicate directly with your gastroenterologist to care for you. Seeing the  Advanced Practice Practitioners on your physician's team can help you by facilitating care more promptly, often allowing for earlier appointments, access to diagnostic testing, procedures, and other specialty referrals.   Thank you for trusting me with your gastrointestinal care. Deanna May, FNP-C

## 2024-06-15 ENCOUNTER — Other Ambulatory Visit: Payer: Self-pay | Admitting: Internal Medicine

## 2024-06-15 ENCOUNTER — Ambulatory Visit: Payer: Self-pay | Admitting: Gastroenterology

## 2024-06-16 ENCOUNTER — Other Ambulatory Visit: Payer: Self-pay | Admitting: Medical Genetics

## 2024-06-18 ENCOUNTER — Telehealth: Payer: Self-pay

## 2024-06-18 NOTE — Telephone Encounter (Signed)
 Received surgery clearance form from [facility]. Pt is needing LKA with debridement and decompression w Dr. Harden. Surgery date scheduled 09/11/24. Pt has CPX on 08/03/24. Will complete form at that time.

## 2024-06-19 ENCOUNTER — Telehealth: Payer: Self-pay | Admitting: Internal Medicine

## 2024-06-19 NOTE — Telephone Encounter (Signed)
 Copied from CRM [id number]. Topic: Appointments - Scheduling Inquiry for Clinic >> Jun 19, 2024  1:30 PM Jasmin G wrote: Reason for CRM: Pt called to reschedule his appt on Oct 13th at 10:00 a.m, upon checking availability, Pt's PCP, Dr. Amon seems to only have appt slots in the afternoon until December, pt was unsure is he should reschedule due to getting lab work at appt and being unsure if she would have to fast or not, please call pt back at [phone] to arrange.

## 2024-06-19 NOTE — Telephone Encounter (Signed)
 Recommend she reschedule to when needed, we can do labs another day or she can skip lunch if only PM appts available.

## 2024-06-26 MED ORDER — LINACLOTIDE 72 MCG PO CAPS: 72.0000 ug | ORAL_CAPSULE | Freq: Every day | ORAL | 1 refills | Status: DC

## 2024-06-26 NOTE — Addendum Note (Signed)
 Addended by: WILL POWELL CROME on: 06/26/2024 08:24 AM   Modules accepted: Orders

## 2024-06-26 NOTE — Telephone Encounter (Signed)
 Script sent to pharmacy.

## 2024-07-02 NOTE — Telephone Encounter (Signed)
 I spoke with the patient on 06/18/24 an schedule her surgery for 09/11/24.

## 2024-07-15 ENCOUNTER — Other Ambulatory Visit (HOSPITAL_BASED_OUTPATIENT_CLINIC_OR_DEPARTMENT_OTHER): Payer: Self-pay

## 2024-07-15 ENCOUNTER — Other Ambulatory Visit (HOSPITAL_BASED_OUTPATIENT_CLINIC_OR_DEPARTMENT_OTHER): Payer: Self-pay | Admitting: Cardiology

## 2024-07-15 DIAGNOSIS — E66811 Obesity, class 1: Secondary | ICD-10-CM

## 2024-07-15 MED ORDER — WEGOVY 2.4 MG/0.75ML ~~LOC~~ SOAJ
2.4000 mg | SUBCUTANEOUS | 11 refills | Status: AC
  Filled 2024-07-15: qty 3, 28d supply, fill #0
  Filled 2024-08-10: qty 3, 28d supply, fill #1
  Filled 2024-09-07 (×2): qty 3, 28d supply, fill #2
  Filled 2024-10-02: qty 3, 28d supply, fill #3
  Filled 2024-11-02 – 2024-11-04 (×2): qty 3, 28d supply, fill #0

## 2024-08-03 ENCOUNTER — Encounter: Admitting: Internal Medicine

## 2024-08-11 ENCOUNTER — Telehealth: Payer: Self-pay | Admitting: Pharmacy Technician

## 2024-08-11 NOTE — Telephone Encounter (Signed)
 Pharmacy Patient Advocate Encounter  Received notification from [employer] that Prior Authorization for wegovy  has been APPROVED from 08/11/24 to 08/11/25   PA #/Case ID/Reference #: [id number]

## 2024-08-11 NOTE — Telephone Encounter (Signed)
   Pharmacy Patient Advocate Encounter   Received notification from Cover[employer]eds that prior authorization for wegovy  is required/requested.   Insurance verification completed.   The patient is insured through [facility].   Per test claim: PA required; PA submitted to above mentioned insurance via Latent Key/confirmation #/EOC [id number] Status is pending

## 2024-08-12 ENCOUNTER — Other Ambulatory Visit

## 2024-08-12 DIAGNOSIS — Z006 Encounter for examination for normal comparison and control in clinical research program: Secondary | ICD-10-CM

## 2024-08-12 LAB — HM MAMMOGRAPHY

## 2024-08-12 NOTE — Addendum Note (Signed)
 Addended by: Destinee Taber K on: 08/12/2024 01:34 PM   Modules accepted: Orders

## 2024-08-13 ENCOUNTER — Other Ambulatory Visit (HOSPITAL_BASED_OUTPATIENT_CLINIC_OR_DEPARTMENT_OTHER): Payer: Self-pay | Admitting: Obstetrics

## 2024-08-13 DIAGNOSIS — M810 Age-related osteoporosis without current pathological fracture: Secondary | ICD-10-CM

## 2024-08-14 ENCOUNTER — Encounter: Payer: Self-pay | Admitting: Internal Medicine

## 2024-08-17 ENCOUNTER — Encounter: Payer: Self-pay | Admitting: Internal Medicine

## 2024-08-17 ENCOUNTER — Ambulatory Visit (INDEPENDENT_AMBULATORY_CARE_PROVIDER_SITE_OTHER): Admitting: Internal Medicine

## 2024-08-17 VITALS — BP 132/84 | HR 69 | Temp 97.7°F | Resp 16 | Ht 64.0 in | Wt 178.5 lb

## 2024-08-17 DIAGNOSIS — R7989 Other specified abnormal findings of blood chemistry: Secondary | ICD-10-CM

## 2024-08-17 DIAGNOSIS — Z0000 Encounter for general adult medical examination without abnormal findings: Secondary | ICD-10-CM

## 2024-08-17 DIAGNOSIS — Z01818 Encounter for other preprocedural examination: Secondary | ICD-10-CM

## 2024-08-17 DIAGNOSIS — M818 Other osteoporosis without current pathological fracture: Secondary | ICD-10-CM

## 2024-08-17 DIAGNOSIS — K582 Mixed irritable bowel syndrome: Secondary | ICD-10-CM

## 2024-08-17 DIAGNOSIS — E782 Mixed hyperlipidemia: Secondary | ICD-10-CM | POA: Diagnosis not present

## 2024-08-17 DIAGNOSIS — I10 Essential (primary) hypertension: Secondary | ICD-10-CM | POA: Diagnosis not present

## 2024-08-17 DIAGNOSIS — Z0001 Encounter for general adult medical examination with abnormal findings: Secondary | ICD-10-CM

## 2024-08-17 DIAGNOSIS — Z23 Encounter for immunization: Secondary | ICD-10-CM | POA: Diagnosis not present

## 2024-08-17 DIAGNOSIS — L821 Other seborrheic keratosis: Secondary | ICD-10-CM

## 2024-08-17 NOTE — Patient Instructions (Signed)
 GO TO THE LAB :  Get the blood work    Then, go to the front desk for the checkout Please make an appointment for a checkup in 4 months  You got a flu shot today, consider a COVID booster  You are cleared for surgery from my side, they also need to reach cardiology  Recommend to start checking your blood pressure regularly Blood pressure goal:  between 110/65 and  135/85. If it is consistently higher or lower, let me know     YOUR PLAN:  LEFT KNEE PAIN: You are scheduled for left knee arthroscopy due to knee pain. -Proceed with surgical clearance for left knee arthroscopy. -Recommend cardiology clearance    HYPERTENSION: Your blood pressure is well-controlled with your current medication. -Continue taking hydrochlorothiazide  and metoprolol . -Recommend periodic home blood pressure monitoring. -Check labs.  HYPERLIPIDEMIA: You are currently taking rosuvastatin  10 mg daily. -Check lipid panel.  PRE-DIABETES: Your last A1c indicates good control.  OBESITY: You are managing your weight with empagliflozin  and Wegovy .  IRRITABLE BOWEL SYNDROME (IBS) AND GASTROESOPHAGEAL REFLUX DISEASE (GERD): Your IBS and GERD are managed by gastroenterology and you are on Linzess .  ANXIETY AND DEPRESSION: You are doing well with your current treatment regimen.  SEBORRHEIC KERATOSIS: You have a new lesion on your right upper chest, likely seborrheic keratosis. -Recommend contacting a dermatologist for evaluation.  GENERAL HEALTH MAINTENANCE: You are menopausal and on hormone therapy. You are due for a repeat bone density test. -Discuss repeat DEXA scan at next visit. -Recommend COVID booster.

## 2024-08-17 NOTE — Assessment & Plan Note (Signed)
 Here for CPX Other issues addressed today Left knee pain Scheduled for arthroscopy. No cardiopulmonary symptoms but a history of a abnormal coronary CTA   Cleared for surgery from my side: Also recommend cardiology clearance  HTN: Blood pressure well-controlled with HCTZ and metoprolol .  Check labs. Hyperlipidemia On rosuvastatin  10 mg daily. Check lipid panel. Hyperglycemia: Last A1c stable. Obesity  Managing weight with empagliflozin  and Wegovy . Irritable Bowel Syndrome (IBS) and  GERD  Managed by gastroenterology was prescribed Linzess . Anxiety and Depression Doing well with Deplin and Pamelor  Seborrheic keratosis New lesion on right upper chest, likely seborrheic keratosis.  Just saw dermatology recently, recommend to contact them for a non-urgent visit since this is a new lesion.  Osteoporosis: Last DEXA 2021, on Fosamax  and HRT for menopause rx by gyn.  No history of vitamin D  deficiency.  Gyn ordering a DEXA per patient. RTC 4 months

## 2024-08-17 NOTE — Telephone Encounter (Signed)
 Completed form faxed back to ATTN: April B at [phone]. Form sent for scanning.

## 2024-08-17 NOTE — Progress Notes (Signed)
 Subjective:    Patient ID: Rebekah [name], female    DOB: Jul 03, 1961, 63 y.o.   MRN: [id number]  DOS:  08/17/2024 CPX, chronic medical problems addressed.  Surgical clearance  Discussed the use of AI scribe software for clinical note transcription with the patient, who gave verbal consent to proceed.  History of Present Illness  Preoperative evaluation and musculoskeletal symptoms - Scheduled for left knee arthroscopy due to knee pain - No chest pain or difficulty with respiration during physical activity  Gastrointestinal symptoms - Occasional nausea, managed with as-needed Zofran  - Diarrhea and constipation lasting up to a week and a half, managed with Linzess  as prescribed by GI - No blood in stools - No vomiting  Genitourinary symptoms - No urinary issues such as burning or blood  Dermatologic findings - New pruritic skin lesion on right upper chest, approximately ten millimeters in size  Menopausal status and preventive care - Menopausal and on hormone medications - Due for bone density test    Review of Systems  Other than above, a 14 point review of systems is negative       Past Medical History:  Diagnosis Date   Allergy    Arthritis    Back pain    Bowel obstruction (HCC) 2019   Depression    Gall bladder stones    GERD (gastroesophageal reflux disease)    Hyperlipidemia    Hypertension    IBS (irritable bowel syndrome)    IBS (irritable bowel syndrome)    Joint pain    Obesity    Lap band surgery 12-08 Dr Gladis   OSA (obstructive sleep apnea)    using a CPAP     Pre-diabetes    REDUCTION MAMMOPLASTY, HX OF 09/22/2007   Qualifier: Diagnosis of  By: Amon MD, Aloysius BRAVO.    Sleep apnea    doesn't use CPAP    Past Surgical History:  Procedure Laterality Date   APPENDECTOMY     BREAST REDUCTION SURGERY     CHOLECYSTECTOMY     CLOSED REDUCTION FINGER WITH PERCUTANEOUS PINNING Left 09/14/2014   Procedure: CLOSED REDUCTION PERCUTANEOUS PINNING ;   Surgeon: Franky Curia, MD;  Location: [facility];  Service: Orthopedics;  Laterality: Left;   COLONOSCOPY     ESOPHAGEAL MANOMETRY N/A 09/23/2017   Procedure: ESOPHAGEAL MANOMETRY (EM);  Surgeon: Shila Gustav GAILS, MD;  Location: [facility];  Service: Endoscopy;  Laterality: N/A;   Lap Band Adjustment  09/01/2015   Dr. Tanda   LAPAROSCOPIC GASTRIC BANDING  09/22/2007   Lap band surgery 12-08 Dr Gladis   NASAL SINUS SURGERY     TONSILLECTOMY     x2   UPPER GASTROINTESTINAL ENDOSCOPY      Current Outpatient Medications  Medication Instructions   alendronate  (FOSAMAX ) 70 mg, Oral, Every 7 days, Take with a full glass of water on an empty stomach.   aspirin  EC 81 mg, Daily   Divigel  1 mg, Daily   empagliflozin  (JARDIANCE ) 10 mg, Oral, Daily before breakfast   hydrochlorothiazide  (MICROZIDE ) 12.5 mg, Oral, Daily   L-Methylfolate-Algae (DEPLIN 7.5) 7.5-90.314 MG CAPS 1 tablet, Oral, Daily   linaclotide  (LINZESS ) 72 mcg, Oral, Daily before breakfast   metoprolol  succinate (TOPROL  XL) 25 mg, Oral, Daily   nitroGLYCERIN  (NITROSTAT ) 0.4 mg, Sublingual, Every 5 min PRN   nortriptyline  (PAMELOR ) 75 mg, Oral, Daily   omeprazole  (PRILOSEC) 40 mg, Oral, Daily   ondansetron  (ZOFRAN -ODT) 4 mg, Oral, Every 8 hours PRN  progesterone (PROMETRIUM) 100 mg, Daily at bedtime   rosuvastatin  (CRESTOR ) 10 mg, Oral, Daily   Wegovy  2.4 mg, Subcutaneous, Weekly       Objective:   Physical Exam BP 132/84   Pulse 69   Temp 97.7 F (36.5 C) (Oral)   Resp 16   Ht 5' 4 (1.626 m)   Wt 178 lb 8 oz (81 kg)   SpO2 97%   BMI 30.64 kg/m  General: Well developed, NAD, BMI noted Neck: No  thyromegaly  HEENT:  Normocephalic . Face symmetric, atraumatic Lungs:  CTA B Normal respiratory effort, no intercostal retractions, no accessory muscle use. Heart: RRR,  no murmur.  Abdomen:  Not distended, soft, non-tender. No rebound or rigidity.   Lower extremities: no pretibial edema  bilaterally  Skin: Exposed areas without rash. Not pale. Not jaundice Neurologic:  alert & oriented X3.  Speech normal, gait appropriate for age and unassisted Strength symmetric and appropriate for age.  Psych: Cognition and judgment appear intact.  Cooperative with normal attention span and concentration.  Behavior appropriate. No anxious or depressed appearing.  Skin lesion, right upper chest:     Assessment   Problem list Prediabetes HTN Hyperlipidemia: lipitor aches (2018) Anxiety, depression (occ suicidal ideas, h/o self cutting). Used to see Dr Tasia GERD- IBS Migraines  Obesity, lap band surgery 2008, adjustment 2016 OSA: Intolerant to CPAP Acne (doxy) ++FH CAD: F, M , B MI age 58, G-parents  Osteopenia: T score -2.3   09/2020, Rx Fosamax  2021 SBO 09-2017    Assessment & Plan Here for CPX -Td 2025 -PNM 23: 09-2015;  Prevnar 05-2016 - shingrix  x 2   -Flu shot today - Recommend a COVID booster -Female care: per gyn DEXA - gyn just ordered a dexa MMG October 2025. -CCS--08/2012--adenomatous polyp, Dr Aneita, Cscope  08/2017 ,cscope 08/2022, next per GI -Diet, exercise: advise and encouragement provided  -Labs:  CMP FLP CBC TSH   Other issues addressed today Left knee pain Scheduled for arthroscopy. No cardiopulmonary symptoms but a history of a abnormal coronary CTA   Cleared for surgery from my side: Also recommend cardiology clearance  HTN: Blood pressure well-controlled with HCTZ and metoprolol .  Check labs. Hyperlipidemia On rosuvastatin  10 mg daily. Check lipid panel. Hyperglycemia: Last A1c stable. Obesity  Managing weight with empagliflozin  and Wegovy . Irritable Bowel Syndrome (IBS) and  GERD  Managed by gastroenterology was prescribed Linzess . Anxiety and Depression Doing well with Deplin and Pamelor  Seborrheic keratosis New lesion on right upper chest, likely seborrheic keratosis.  Just saw dermatology recently, recommend to contact them for a  non-urgent visit since this is a new lesion.  Osteoporosis: Last DEXA 2021, on Fosamax  and HRT for menopause rx by gyn.  No history of vitamin D  deficiency.  Gyn ordering a DEXA per patient. RTC 4 months

## 2024-08-17 NOTE — Assessment & Plan Note (Signed)
 Here for CPX -Td 2025 -PNM 23: 09-2015;  Prevnar 05-2016 - shingrix  x 2   -Flu shot today - Recommend a COVID booster -Female care: per gyn DEXA - gyn just ordered a dexa MMG October 2025. -CCS--08/2012--adenomatous polyp, Dr Aneita, Cscope  08/2017 ,cscope 08/2022, next per GI -Diet, exercise: advise and encouragement provided  -Labs:  CMP FLP CBC TSH

## 2024-08-18 LAB — CBC WITH DIFFERENTIAL/PLATELET
Basophils Absolute: 0.1 K/uL (ref 0.0–0.1)
Basophils Relative: 0.8 % (ref 0.0–3.0)
Eosinophils Absolute: 0.2 K/uL (ref 0.0–0.7)
Eosinophils Relative: 1.5 % (ref 0.0–5.0)
HCT: 45.7 % (ref 36.0–46.0)
Hemoglobin: 15.1 g/dL — ABNORMAL HIGH (ref 12.0–15.0)
Lymphocytes Relative: 35.3 % (ref 12.0–46.0)
Lymphs Abs: 3.8 K/uL (ref 0.7–4.0)
MCHC: 33.1 g/dL (ref 30.0–36.0)
MCV: 90.2 fl (ref 78.0–100.0)
Monocytes Absolute: 0.5 K/uL (ref 0.1–1.0)
Monocytes Relative: 4.8 % (ref 3.0–12.0)
Neutro Abs: 6.2 K/uL (ref 1.4–7.7)
Neutrophils Relative %: 57.6 % (ref 43.0–77.0)
Platelets: 313 K/uL (ref 150.0–400.0)
RBC: 5.07 Mil/uL (ref 3.87–5.11)
RDW: 15 % (ref 11.5–15.5)
WBC: 10.8 K/uL — ABNORMAL HIGH (ref 4.0–10.5)

## 2024-08-18 LAB — COMPREHENSIVE METABOLIC PANEL WITH GFR
ALT: 14 U/L (ref 0–35)
AST: 17 U/L (ref 0–37)
Albumin: 4.5 g/dL (ref 3.5–5.2)
Alkaline Phosphatase: 61 U/L (ref 39–117)
BUN: 11 mg/dL (ref 6–23)
CO2: 30 meq/L (ref 19–32)
Calcium: 9.6 mg/dL (ref 8.4–10.5)
Chloride: 100 meq/L (ref 96–112)
Creatinine, Ser: 0.94 mg/dL (ref 0.40–1.20)
GFR: 65.26 mL/min (ref >60.00)
Glucose, Bld: 74 mg/dL (ref 70–99)
Potassium: 4.5 meq/L (ref 3.5–5.1)
Sodium: 138 meq/L (ref 135–145)
Total Bilirubin: 0.4 mg/dL (ref 0.2–1.2)
Total Protein: 7.5 g/dL (ref 6.0–8.3)

## 2024-08-18 LAB — LIPID PANEL
Cholesterol: 159 mg/dL (ref 0–200)
HDL: 52.8 mg/dL (ref >39.00)
LDL Cholesterol: 82 mg/dL (ref 0–99)
NonHDL: 106.52
Total CHOL/HDL Ratio: 3
Triglycerides: 125 mg/dL (ref 0.0–149.0)
VLDL: 25 mg/dL (ref 0.0–40.0)

## 2024-08-18 LAB — TSH: TSH: 2.54 u[IU]/mL (ref 0.35–5.50)

## 2024-08-20 ENCOUNTER — Ambulatory Visit: Payer: Self-pay | Admitting: Internal Medicine

## 2024-08-21 ENCOUNTER — Other Ambulatory Visit: Payer: Self-pay | Admitting: Internal Medicine

## 2024-08-21 LAB — OPHTHALMOLOGY REPORT-SCANNED

## 2024-08-21 LAB — GENECONNECT MOLECULAR SCREEN: Genetic Analysis Overall Interpretation: NEGATIVE

## 2024-08-24 ENCOUNTER — Encounter: Payer: Self-pay | Admitting: Orthopedic Surgery

## 2024-08-24 ENCOUNTER — Encounter: Payer: Self-pay | Admitting: Radiology

## 2024-09-02 ENCOUNTER — Telehealth: Payer: Self-pay

## 2024-09-02 ENCOUNTER — Telehealth (HOSPITAL_BASED_OUTPATIENT_CLINIC_OR_DEPARTMENT_OTHER): Payer: Self-pay

## 2024-09-02 NOTE — Telephone Encounter (Signed)
 Called [facility] and LM to ask what type of anesthesia would be used. I will complete the clearance after.         Pre-operative Risk Assessment    Patient Name: Rebekah [name]  DOB: [birth year] MRN: [id number]   Date of last office visit: Telephone visit 04/20/24 - Rebekah Finder, NP and In-Person OV 03/03/24 - Rebekah Finder, NP   Date of next office visit: N/A  Request for Surgical Clearance    Procedure:  Left knee arthroscopy, debridement & decompression  Date of Surgery:  Clearance TBD                                 Surgeon:  Dr. Harden Socks Group or Practice Name:  [facility] Phone number:  [phone] Fax number:  [phone]   Type of Clearance Requested:   - Medical  - Pharmacy:  Hold Aspirin  -Does not specify   Type of Anesthesia:     Additional requests/questions:  N/A  SignedPatrcia Iverson CROME   09/02/2024, 8:20 AM

## 2024-09-02 NOTE — Telephone Encounter (Signed)
 Was able to speak to surgeon's office regarding anesthesia office stated it is choice

## 2024-09-02 NOTE — Telephone Encounter (Signed)
Patient has been scheduled for televisit.

## 2024-09-02 NOTE — Telephone Encounter (Signed)
   Name: Rebekah [name]  DOB: August 12, 1961  MRN: [id number]  Primary Cardiologist: Shelda Bruckner, MD   Preoperative team, please contact this patient and set up a phone call appointment for further preoperative risk assessment. Please obtain consent and complete medication review. Thank you for your help.  Re: ASA, has hx of mild-moderate CAD by cor CT 03/2024 with neg FFR. As long as no new interim hx, should be OK for holding if needed per guidelines but chart states that this was not specified to be held.  Callback please also clarify A) anesthesia and b) if needing to hold ASA. Thanks.  I also confirmed the patient resides in the state of [place] . As per [facility] Medical Board telemedicine laws, the patient must reside in the state in which the provider is licensed.   Raphael LOISE Bring, PA-C 09/02/2024, 11:07 AM [facility]

## 2024-09-02 NOTE — Telephone Encounter (Signed)
 Patient has been scheduled for televisit and med rec and consent done     Patient Consent for Virtual Visit         Arwilda TATUM CORL has provided verbal consent on 09/02/2024 for a virtual visit (video or telephone).   CONSENT FOR VIRTUAL VISIT FOR:  Ladasha O Tomkiewicz  By participating in this virtual visit I agree to the following:  I hereby voluntarily request, consent and authorize [facility] and its employed or contracted physicians, physician assistants, nurse practitioners or other licensed health care professionals (the Practitioner), to provide me with telemedicine health care services (the "Services) as deemed necessary by the treating Practitioner. I acknowledge and consent to receive the Services by the Practitioner via telemedicine. I understand that the telemedicine visit will involve communicating with the Practitioner through live audiovisual communication technology and the disclosure of certain medical information by electronic transmission. I acknowledge that I have been given the opportunity to request an in-person assessment or other available alternative prior to the telemedicine visit and am voluntarily participating in the telemedicine visit.  I understand that I have the right to withhold or withdraw my consent to the use of telemedicine in the course of my care at any time, without affecting my right to future care or treatment, and that the Practitioner or I may terminate the telemedicine visit at any time. I understand that I have the right to inspect all information obtained and/or recorded in the course of the telemedicine visit and may receive copies of available information for a reasonable fee.  I understand that some of the potential risks of receiving the Services via telemedicine include:  Delay or interruption in medical evaluation due to technological equipment failure or disruption; Information transmitted may not be sufficient (e.g. poor resolution of  images) to allow for appropriate medical decision making by the Practitioner; and/or  In rare instances, security protocols could fail, causing a breach of personal health information.  Furthermore, I acknowledge that it is my responsibility to provide information about my medical history, conditions and care that is complete and accurate to the best of my ability. I acknowledge that Practitioner's advice, recommendations, and/or decision may be based on factors not within their control, such as incomplete or inaccurate data provided by me or distortions of diagnostic images or specimens that may result from electronic transmissions. I understand that the practice of medicine is not an exact science and that Practitioner makes no warranties or guarantees regarding treatment outcomes. I acknowledge that a copy of this consent can be made available to me via my patient portal [facility] MyChart), or I can request a printed copy by calling the office of [facility].    I understand that my insurance will be billed for this visit.   I have read or had this consent read to me. I understand the contents of this consent, which adequately explains the benefits and risks of the Services being provided via telemedicine.  I have been provided ample opportunity to ask questions regarding this consent and the Services and have had my questions answered to my satisfaction. I give my informed consent for the services to be provided through the use of telemedicine in my medical care

## 2024-09-03 ENCOUNTER — Ambulatory Visit: Payer: Self-pay

## 2024-09-03 NOTE — Telephone Encounter (Signed)
 Pt out of town. Going to UC.

## 2024-09-03 NOTE — Telephone Encounter (Signed)
 FYI Only or Action Required?: FYI only for provider: patient out of town, advised to go to urgent care.  Patient was last seen in primary care on 08/17/2024 by Rebekah Aloysius BRAVO, MD.  Called Nurse Triage reporting Fall.  Symptoms began 9 days ago.  Interventions attempted: OTC medications: Tylenol  and Rest, hydration, or home remedies.  Symptoms are: gradually worsening.  Triage Disposition: See HCP Within 4 Hours (Or PCP Triage)  Patient/caregiver understands and will follow disposition?: Patient out of town, advised to go to urgent care     Copied from CRM [id number]. Topic: Clinical - Red Word Triage >> Sep 03, 2024 12:25 PM Rebekah [name] wrote: Red Word that prompted transfer to Nurse Triage: Patient fell last tuesday 08/25/2024  and believes she may have broken her tailbone, she is experiencing severe pain and has not gone to an ED due to being out of town.      Reason for Disposition  [1] MODERATE weakness (e.g., interferes with work, school, normal activities) AND [2] new-onset or getting worse  Answer Assessment - Initial Assessment Questions 1. MECHANISM: How did the fall happen?     Clemens out of bed 2. DOMESTIC VIOLENCE AND ELDER ABUSE SCREENING: Did you fall because someone pushed you or tried to hurt you? If Yes, ask: Are you safe now?     No  3. ONSET: When did the fall happen? (e.g., minutes, hours, or days ago)     9 days ago  4. LOCATION: What part of the body hit the ground? (e.g., back, buttocks, head, hips, knees, hands, head, stomach)     Tailbone  5. INJURY: Did you hurt (injure) yourself when you fell? If Yes, ask: What did you injure? Tell me more about this? (e.g., body area; type of injury; pain severity)     Injured tailbone  6. PAIN: Is there any pain? If Yes, ask: How bad is the pain? (e.g., Scale 0-10; or none, mild,      Moderate to severe  7. SIZE: For cuts, bruises, or swelling, ask: How large is it? (e.g., inches or centimeters)       N/A 9. OTHER SYMPTOMS: Do you have any other symptoms? (e.g., dizziness, fever, weakness; new-onset or worsening).      No 10. CAUSE: What do you think caused the fall (or falling)? (e.g., dizzy spell, tripped)       Clemens out of bed and hit her tailbone  Protocols used: Falls and [facility]

## 2024-09-05 ENCOUNTER — Other Ambulatory Visit: Payer: Self-pay | Admitting: Internal Medicine

## 2024-09-05 DIAGNOSIS — I10 Essential (primary) hypertension: Secondary | ICD-10-CM

## 2024-09-07 ENCOUNTER — Other Ambulatory Visit (HOSPITAL_BASED_OUTPATIENT_CLINIC_OR_DEPARTMENT_OTHER): Payer: Self-pay

## 2024-09-07 ENCOUNTER — Other Ambulatory Visit: Payer: Self-pay

## 2024-09-07 ENCOUNTER — Ambulatory Visit (INDEPENDENT_AMBULATORY_CARE_PROVIDER_SITE_OTHER): Admitting: Family Medicine

## 2024-09-07 VITALS — BP 117/82 | HR 73 | Temp 98.0°F | Resp 16 | Wt 174.7 lb

## 2024-09-07 DIAGNOSIS — S322XXA Fracture of coccyx, initial encounter for closed fracture: Secondary | ICD-10-CM | POA: Diagnosis not present

## 2024-09-07 DIAGNOSIS — M8580 Other specified disorders of bone density and structure, unspecified site: Secondary | ICD-10-CM | POA: Diagnosis not present

## 2024-09-07 DIAGNOSIS — S3210XA Unspecified fracture of sacrum, initial encounter for closed fracture: Secondary | ICD-10-CM | POA: Insufficient documentation

## 2024-09-07 MED ORDER — MELOXICAM 7.5 MG PO TABS
7.5000 mg | ORAL_TABLET | Freq: Every day | ORAL | 1 refills | Status: AC
Start: 2024-09-07 — End: ?
  Filled 2024-09-07: qty 30, 30d supply, fill #0

## 2024-09-07 NOTE — Assessment & Plan Note (Signed)
 Managed with alendronate . Follow-up bone density scan pending. - Continue alendronate . - Await scheduling of bone density scan.

## 2024-09-07 NOTE — Progress Notes (Signed)
    Procedures performed today:    None.  Independent interpretation of notes and tests performed by another provider:   None.  Brief History, Exam, Impression, and Recommendations:    BP 117/82 (Cuff Size: Large)   Pulse 73   Temp 98 F (36.7 C) (Oral)   Resp 16   Wt 174 lb 11.2 oz (79.2 kg)   SpO2 97%   BMI 29.99 kg/m   Discussed the use of AI scribe software for clinical note transcription with the patient, who gave verbal consent to proceed.  History of Present Illness Rebekah [name] is a 63 year old female who presents with back pain following a fall.  Approximately two weeks ago, she experienced a fall while having a nightmare, during which she fell off the bed and hit her back against a metal frame. Initially, she thought her head hurt more, but the following day, her back pain worsened and did not improve.  She visited [facility] on Saturday, where x-rays were taken. Initially, the PA did not think there was a fracture, but later it was confirmed that she had a sacral fracture. She did not receive detailed information about the fracture's size or specifics.  For pain management, she has been using a muscle relaxer, Tylenol  Arthritis, and a heating pad. She finds the heating pad to be the most effective. Her pain is exacerbated by bending over, with pain levels reaching 8 or 9 out of 10, while sitting pain is around 1 or 2 out of 10. Walking does not seem to bother her much, but sitting and certain lying positions do.  No tingling in the legs or bowel or bladder issues. Her daily activities, such as walking and moving around the house, cause mild discomfort, rated at 2 or 3 out of 10. 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  Her past medical history includes osteopenia, for which she takes alendronate . She is scheduled for a bone density test, but the appointment has not been confirmed  yet. She has allergies to some antibiotics and metformin .  [place] and is responsible for taking care of chickens, dogs, and cats. She works at [employer], which involves physical activity.  On exam, patient is no acute distress, vital signs stable.  No tenderness to palpation through lumbar spine.  She is tender to palpation essentially through sacrum.  Mildly antalgic gait in office.  Closed fracture of sacrum and coccyx, initial encounter [facility]) Assessment & Plan: Sacral fracture confirmed by x-ray. Pain ranges from 1-2 at rest to 8-9 with bending. No neurological deficits or bowel/bladder issues. Conservative management with rest and pain control. - Prescribed meloxicam 7.5 mg once daily. - Advised rest and conservative movement. - Requested urgent care records, including x-ray report. - Scheduled follow-up in 2-3 weeks. - Ordered x-ray before follow-up to assess healing.  Orders: -     DG Sacrum/Coccyx; Future  Osteopenia, unspecified location Assessment & Plan: Managed with alendronate . Follow-up bone density scan pending. - Continue alendronate . - Await scheduling of bone density scan.   Other orders -     Meloxicam; Take 1 tablet (7.5 mg total) by mouth daily.  Dispense: 30 tablet; Refill: 1  Return in about 2 weeks (around 09/21/2024) for fracture follow-up.   ___________________________________________ Delesia Martinek de [place], MD, ABFM, CAQSM Primary Care and Sports Medicine [facility]

## 2024-09-07 NOTE — Assessment & Plan Note (Signed)
 Sacral fracture confirmed by x-ray. Pain ranges from 1-2 at rest to 8-9 with bending. No neurological deficits or bowel/bladder issues. Conservative management with rest and pain control. - Prescribed meloxicam 7.5 mg once daily. - Advised rest and conservative movement. - Requested urgent care records, including x-ray report. - Scheduled follow-up in 2-3 weeks. - Ordered x-ray before follow-up to assess healing.

## 2024-09-14 ENCOUNTER — Ambulatory Visit: Attending: Cardiology | Admitting: Cardiology

## 2024-09-14 DIAGNOSIS — Z01810 Encounter for preprocedural cardiovascular examination: Secondary | ICD-10-CM

## 2024-09-14 DIAGNOSIS — Z01818 Encounter for other preprocedural examination: Secondary | ICD-10-CM

## 2024-09-14 NOTE — Progress Notes (Signed)
 Virtual Visit via Telephone Note   Because of Rebekah [name] co-morbid illnesses, she is at least at moderate risk for complications without adequate follow up.  This format is felt to be most appropriate for this patient at this time.  Due to technical limitations with video connection (technology), today's appointment will be conducted as an audio only telehealth visit, and Rebekah [name] verbally agreed to proceed in this manner.   All issues noted in this document were discussed and addressed.  No physical exam could be performed with this format.  Evaluation Performed:  Preoperative cardiovascular risk assessment _____________   Date:  09/14/2024   Patient ID:  Rebekah [name], DOB [birth year], MRN [id number] Patient Location:  Home Provider location:   Office  Primary Care Provider:  Amon Aloysius BRAVO, MD Primary Cardiologist:  Shelda Bruckner, MD  Chief Complaint / Patient Profile   63 y.o. y/o female with a h/o non-obstructive CAD, hypertension, hyperlipidemia, obesity, gastric banding, OSA on CPAP,  who is pending left knee arthroscopy, debridement & decompression and presents today for telephonic preoperative cardiovascular risk assessment.  History of Present Illness    Rebekah [name] is a 63 y.o. female who presents via audio/video conferencing for a telehealth visit today.  Pt was last seen in cardiology clinic on 03/03/2024 by Reche Finder, NP.  At that time Rebekah [name] was doing well from a cardiac perspective but wanted us  dealing with GI issues.  The patient is now pending procedure as outlined above. 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 She denies chest pain, palpitations, dyspnea, pnd, orthopnea, n, v, dizziness, syncope, edema, weight gain, or early satiety.     Past Medical History    Past Medical History:  Diagnosis Date   Allergy    Arthritis    Back  pain    Bowel obstruction (HCC) 2019   Depression    Gall bladder stones    GERD (gastroesophageal reflux disease)    Hyperlipidemia    Hypertension    IBS (irritable bowel syndrome)    IBS (irritable bowel syndrome)    Joint pain    Obesity    Lap band surgery 12-08 Dr Gladis   OSA (obstructive sleep apnea)    using a CPAP     Pre-diabetes    REDUCTION MAMMOPLASTY, HX OF 09/22/2007   Qualifier: Diagnosis of  By: Amon MD, Aloysius BRAVO.    Sleep apnea    doesn't use CPAP   Past Surgical History:  Procedure Laterality Date   APPENDECTOMY     BREAST REDUCTION SURGERY     CHOLECYSTECTOMY     CLOSED REDUCTION FINGER WITH PERCUTANEOUS PINNING Left 09/14/2014   Procedure: CLOSED REDUCTION PERCUTANEOUS PINNING ;  Surgeon: Franky Curia, MD;  Location: [facility];  Service: Orthopedics;  Laterality: Left;   COLONOSCOPY     ESOPHAGEAL MANOMETRY N/A 09/23/2017   Procedure: ESOPHAGEAL MANOMETRY (EM);  Surgeon: Shila Gustav GAILS, MD;  Location: [facility];  Service: Endoscopy;  Laterality: N/A;   Lap Band Adjustment  09/01/2015   Dr. Tanda   LAPAROSCOPIC GASTRIC BANDING  09/22/2007   Lap band surgery 12-08 Dr Gladis   NASAL SINUS SURGERY     TONSILLECTOMY     x2   UPPER GASTROINTESTINAL ENDOSCOPY      Allergies  Allergies  Allergen Reactions   Penicillins Hives and Rash  Has patient had a PCN reaction causing immediate rash, facial/tongue/throat swelling, SOB or lightheadedness with hypotension: Yes Has patient had a PCN reaction causing severe rash involving mucus membranes or skin necrosis: Yes Has patient had a PCN reaction that required hospitalization: No Has patient had a PCN reaction occurring within the last 10 years: No If all of the above answers are NO, then may proceed with Cephalosporin use.    Metformin  And Related     See patient's note from 08/07/2022   Erythromycin Nausea Only    Gastrointestinal issues.    Home Medications    Prior to  Admission medications   Medication Sig Start Date End Date Taking? Authorizing Provider  alendronate  (FOSAMAX ) 70 MG tablet Take 1 tablet (70 mg total) by mouth every 7 (seven) days. Take with a full glass of water on an empty stomach. 01/31/22   Amon Aloysius BRAVO, MD  aspirin  EC 81 MG tablet Take 81 mg by mouth daily. Swallow whole.    [provider]  cyclobenzaprine  (FLEXERIL ) 5 MG tablet Take 5 mg by mouth 3 (three) times daily as needed. 09/05/24   [provider]  empagliflozin  (JARDIANCE ) 10 MG TABS tablet Take 1 tablet (10 mg total) by mouth daily before breakfast. 06/15/24   Paz, Jose E, MD  Estradiol  (DIVIGEL ) 1 MG/GM GEL Place 1 mg onto the skin daily.    [provider]  hydrochlorothiazide  (MICROZIDE ) 12.5 MG capsule Take 1 capsule (12.5 mg total) by mouth daily. 09/07/24   Paz, Jose E, MD  L-Methylfolate-Algae (DEPLIN 7.5) 7.5-90.314 MG CAPS Take 1 tablet by mouth daily. 03/31/24   Amon Aloysius BRAVO, MD  linaclotide  (LINZESS ) 72 MCG capsule Take 1 capsule (72 mcg total) by mouth daily before breakfast. 06/26/24   May, Deanna J, NP  meloxicam  (MOBIC ) 7.5 MG tablet Take 1 tablet (7.5 mg total) by mouth daily. 09/07/24   de Cuba, Raymond J, MD  metoprolol  succinate (TOPROL  XL) 25 MG 24 hr tablet Take 1 tablet (25 mg total) by mouth daily. 03/27/24   Vannie Reche RAMAN, NP  nitroGLYCERIN  (NITROSTAT ) 0.4 MG SL tablet Place 1 tablet (0.4 mg total) under the tongue every 5 (five) minutes as needed for chest pain. 04/20/24   Vannie Reche RAMAN, NP  nortriptyline  (PAMELOR ) 75 MG capsule Take 1 capsule (75 mg total) by mouth daily. 08/21/24   Amon Aloysius BRAVO, MD  omeprazole  (PRILOSEC) 40 MG capsule Take 1 capsule (40 mg total) by mouth daily. 06/09/24   Paz, Jose E, MD  ondansetron  (ZOFRAN -ODT) 4 MG disintegrating tablet Take 1 tablet (4 mg total) by mouth every 8 (eight) hours as needed for nausea or vomiting. 01/29/24   Paz, Jose E, MD  progesterone (PROMETRIUM) 100 MG capsule Take 100 mg by  mouth at bedtime. 04/14/24   [provider]  rosuvastatin  (CRESTOR ) 10 MG tablet Take 1 tablet (10 mg total) by mouth daily. 08/21/24   Amon Aloysius BRAVO, MD  semaglutide -weight management (WEGOVY ) 2.4 MG/0.75ML SOAJ SQ injection Inject 2.4 mg into the skin once a week. 07/15/24   Lonni Slain, MD    Physical Exam    Vital Signs:  Rebekah [name] does not have vital signs available for review today.  Given telephonic nature of communication, physical exam is limited. AAOx3. NAD. Normal affect.  Speech and respirations are unlabored.  Accessory Clinical Findings    None  Assessment & Plan    1.  Preoperative Cardiovascular Risk Assessment:    Ms. Croy  perioperative risk of a major cardiac event is 0.4% according to the Revised Cardiac Risk Index (RCRI).  Therefore, she is at low risk for perioperative complications.   Her functional capacity is excellent at 8.33 METs according to the Duke Activity Status Index (DASI). Recommendations: According to ACC/AHA guidelines, no further cardiovascular testing needed.  The patient may proceed to surgery at acceptable risk.   Antiplatelet and/or Anticoagulation Recommendations: Aspirin  can be held for 5-7 days prior to her surgery.  Please resume Aspirin  post operatively when it is felt to be safe from a bleeding standpoint.    A copy of this note will be routed to requesting surgeon.  Time:   Today, I have spent 10 minutes with the patient with telehealth technology discussing medical history, symptoms, and management plan.     Delon JAYSON Hoover, NP  09/14/2024, 7:40 AM

## 2024-09-21 ENCOUNTER — Encounter (HOSPITAL_BASED_OUTPATIENT_CLINIC_OR_DEPARTMENT_OTHER): Payer: Self-pay | Admitting: Family Medicine

## 2024-09-21 ENCOUNTER — Ambulatory Visit (INDEPENDENT_AMBULATORY_CARE_PROVIDER_SITE_OTHER)

## 2024-09-21 ENCOUNTER — Encounter: Admitting: Orthopedic Surgery

## 2024-09-21 ENCOUNTER — Ambulatory Visit (INDEPENDENT_AMBULATORY_CARE_PROVIDER_SITE_OTHER): Admitting: Family Medicine

## 2024-09-21 VITALS — BP 116/87 | HR 77 | Temp 97.8°F | Resp 18 | Ht 64.0 in | Wt 173.0 lb

## 2024-09-21 DIAGNOSIS — S322XXA Fracture of coccyx, initial encounter for closed fracture: Secondary | ICD-10-CM | POA: Diagnosis not present

## 2024-09-21 DIAGNOSIS — S3210XD Unspecified fracture of sacrum, subsequent encounter for fracture with routine healing: Secondary | ICD-10-CM | POA: Diagnosis not present

## 2024-09-21 DIAGNOSIS — S322XXD Fracture of coccyx, subsequent encounter for fracture with routine healing: Secondary | ICD-10-CM | POA: Diagnosis not present

## 2024-09-21 DIAGNOSIS — S3210XA Unspecified fracture of sacrum, initial encounter for closed fracture: Secondary | ICD-10-CM

## 2024-09-21 NOTE — Assessment & Plan Note (Signed)
 Patient reports that she has been doing well and pain has noticeably improved.  She notes that she has not been requiring medication to help with pain.  She will notice a twinge of pain at times if she bends over to pick something up, however this is short-lived and resolves on its own.  Denies any issues with her activity such as walking. On exam, patient has normal gait in office.  She does not have any tenderness palpation through lumbar spine or over sacrum/coccyx. We can proceed with x-rays today for monitoring.  Did review imaging report from urgent care which does note nondisplaced fracture at 4th/5th segment of sacrum.  We will ensure no concerning findings with x-rays today.  Can continue with conservative management.  Will plan to follow-up as needed

## 2024-09-21 NOTE — Progress Notes (Signed)
    Procedures performed today:    None.  Independent interpretation of notes and tests performed by another provider:   None.  Brief History, Exam, Impression, and Recommendations:    BP 116/87 (BP Location: Left Arm, Patient Position: Sitting, Cuff Size: Normal)   Pulse 77   Temp 97.8 F (36.6 C) (Oral)   Resp 18   Ht 5' 4 (1.626 m)   Wt 173 lb (78.5 kg)   SpO2 100%   BMI 29.70 kg/m   Closed fracture of sacrum and coccyx with routine healing, subsequent encounter Assessment & Plan: Patient reports that she has been doing well and pain has noticeably improved.  She notes that she has not been requiring medication to help with pain.  She will notice a twinge of pain at times if she bends over to pick something up, however this is short-lived and resolves on its own.  Denies any issues with her activity such as walking. On exam, patient has normal gait in office.  She does not have any tenderness palpation through lumbar spine or over sacrum/coccyx. We can proceed with x-rays today for monitoring.  Did review imaging report from urgent care which does note nondisplaced fracture at 4th/5th segment of sacrum.  We will ensure no concerning findings with x-rays today.  Can continue with conservative management.  Will plan to follow-up as needed   Return if symptoms worsen or fail to improve.   ___________________________________________ Latica Hohmann de [place], MD, ABFM, CAQSM Primary Care and Sports Medicine [facility]

## 2024-09-28 NOTE — H&P (Signed)
 Rebekah [name] is an 63 y.o. female.   Chief Complaint: left knee pain HPI:  The patient is a 63 year old woman who presents today for ongoing left knee pain with minimal improvement this has been ongoing for over a year now.  At the time of onset this came on suddenly she could not recall any specific injury but began having significant medial knee pain which has hindered her ability to complete her ADL's. She has stiffness pain with deep flexion.  She has been having some night pain difficulty getting comfortable waking from sleep due to her knee pain.  She has pain with her knee fully extended denies any locking catching or giving way   Has had Depo-Medrol  injections which have provided her with modest relief she last had an injection in October of last year  Past Medical History:  Diagnosis Date   Allergy    Arthritis    Back pain    Bowel obstruction (HCC) 2019   Depression    Gall bladder stones    GERD (gastroesophageal reflux disease)    Hyperlipidemia    Hypertension    IBS (irritable bowel syndrome)    IBS (irritable bowel syndrome)    Joint pain    Obesity    Lap band surgery 12-08 Dr Gladis   OSA (obstructive sleep apnea)    using a CPAP     Pre-diabetes    REDUCTION MAMMOPLASTY, HX OF 09/22/2007   Qualifier: Diagnosis of  By: Amon MD, Aloysius DRAFTS    Sleep apnea    doesn't use CPAP    Past Surgical History:  Procedure Laterality Date   APPENDECTOMY     BREAST REDUCTION SURGERY     CHOLECYSTECTOMY     CLOSED REDUCTION FINGER WITH PERCUTANEOUS PINNING Left 09/14/2014   Procedure: CLOSED REDUCTION PERCUTANEOUS PINNING ;  Surgeon: Franky Curia, MD;  Location: [facility];  Service: Orthopedics;  Laterality: Left;   COLONOSCOPY     ESOPHAGEAL MANOMETRY N/A 09/23/2017   Procedure: ESOPHAGEAL MANOMETRY (EM);  Surgeon: Shila Gustav GAILS, MD;  Location: [facility];  Service: Endoscopy;  Laterality: N/A;   Lap Band Adjustment  09/01/2015   Dr. Tanda    LAPAROSCOPIC GASTRIC BANDING  09/22/2007   Lap band surgery 12-08 Dr Gladis   NASAL SINUS SURGERY     TONSILLECTOMY     x2   UPPER GASTROINTESTINAL ENDOSCOPY      Family History  Problem Relation Age of Onset   Breast cancer Mother        age 6   Heart disease Mother        CABG, smoker .age onset 40s   Diverticulitis Mother    High blood pressure Mother    High Cholesterol Mother    Thyroid  disease Mother    Depression Mother    Obesity Mother    Coronary artery disease Father        CABG, smoker .age onset 53s   Prostate cancer Father    Pulmonary fibrosis Father        and others   High blood pressure Father    High Cholesterol Father    Depression Father    Obesity Father    Heart attack Brother        age 57   Diabetes Neg Hx    Colon cancer Neg Hx    Stomach cancer Neg Hx    Esophageal cancer Neg Hx    Rectal cancer Neg Hx  Social History:  reports that she has never smoked. She has never been exposed to tobacco smoke. She has never used smokeless tobacco. She reports current alcohol use. She reports that she does not use drugs.  Allergies:  Allergies  Allergen Reactions   Penicillins Hives and Rash    Has patient had a PCN reaction causing immediate rash, facial/tongue/throat swelling, SOB or lightheadedness with hypotension: Yes Has patient had a PCN reaction causing severe rash involving mucus membranes or skin necrosis: Yes Has patient had a PCN reaction that required hospitalization: No Has patient had a PCN reaction occurring within the last 10 years: No If all of the above answers are NO, then may proceed with Cephalosporin use.    Metformin  And Related     See patient's note from 08/07/2022   Erythromycin Nausea Only    Gastrointestinal issues.    No medications prior to admission.    No results found for this or any previous visit (from the past 48 hours). No results found.  Review of Systems  All other systems reviewed and are  negative.   There were no vitals taken for this visit. Physical Exam  Patient is alert, oriented, no adenopathy, well-dressed, normal affect, normal respiratory effort.   On examination right knee she is having medial joint line tenderness.  There is full active range of motion present.  No crepitation her collaterals and cruciates are stable.  There is no effusion.  MRI findings Bones: There is moderate tricompartmental osteoarthrosis with full-thickness cartilage loss in the lateral patellofemoral compartment and significant chondromalacia in the lateral compartment. There are subchondral cystic change in the lateral tibial plateau and patella. There is a small reactive joint effusion. The extensor mechanism is intact.   Ligaments: The ACL, PCL, MCL and fibular collateral ligament are intact.   Menisci: Medial meniscus is unremarkable. There is a complex horizontal tear involving the entire anterior horn and body of the lateral meniscus best seen on coronal image 13 through 17. There is a moderate-sized likely para-meniscal cyst adjacent to the anterior horn of the lateral meniscus measuring approximately 10 x 13 mm in size best seen on sagittal image 19.   IMPRESSION: Moderate tricompartmental osteoarthrosis most notably of the lateral patellofemoral compartment and lateral compartment as above. There insignificant chondromalacia.   Large horizontal tear of the lateral meniscus without displaced meniscal flap. Moderate-sized para-meniscal cyst adjacent to the anterior lateral joint line as above.  Assessment/Plan Left knee pain that has failed conservative treatment.   MRI demonstrates Large horizontal tear of the lateral meniscus without displaced meniscal flap. Moderate-sized para-meniscal cyst adjacent to the anterior lateral joint line as above.  Plan for arthroscopy of the left knee with Dr. Harden 09/30/24.   Maurilio Deland Collet, PA-C 09/28/2024, 4:50 PM

## 2024-09-29 ENCOUNTER — Other Ambulatory Visit: Payer: Self-pay

## 2024-09-29 ENCOUNTER — Encounter (HOSPITAL_COMMUNITY): Payer: Self-pay | Admitting: Orthopedic Surgery

## 2024-09-29 NOTE — Anesthesia Preprocedure Evaluation (Signed)
 Anesthesia Evaluation    Airway        Dental   Pulmonary           Cardiovascular hypertension,      Neuro/Psych    GI/Hepatic   Endo/Other    Renal/GU      Musculoskeletal   Abdominal   Peds  Hematology   Anesthesia Other Findings   Reproductive/Obstetrics                              Anesthesia Physical Anesthesia Plan  ASA:   Anesthesia Plan:    Post-op Pain Management:    Induction:   PONV Risk Score and Plan:   Airway Management Planned:   Additional Equipment:   Intra-op Plan:   Post-operative Plan:   Informed Consent:   Plan Discussed with:   Anesthesia Plan Comments: (PAT note written 09/29/2024 by Maxmilian Trostel, PA-C.  )        Anesthesia Quick Evaluation

## 2024-09-29 NOTE — Progress Notes (Signed)
 Anesthesia Chart Review: SAME DAY WORK-UP  Case: [id number] Date/Time: 09/30/24 0815   Procedure: DEBRIDEMENT, MENISCUS, KNEE (Left: Knee) - Knee arthroscopy   Anesthesia type: Choice   Diagnosis: Acute lateral meniscus tear of left knee, initial encounter [S83.282A]   Pre-op diagnosis: LEFT KNEE LATERAL MENISCUS TEAR   [facility]: [facility] ROOM 04 / [facility]   Surgeons: Harden Jerona GAILS, MD       DISCUSSION: Patient is a 63 year old female scheduled for the above procedure.  History includes never smoker, HTN, CAD, prediabetes, HLD, GERD, OSA (mild OSA 7/20/206), IBS, laparoscopic gastric banding (10/06/2007 with adjustment 11/102016), SBO (09/2017, managed non-operatively).  Established with cardiologist Dr. Lonni in 01/2023 for family history of CAD. CCTA 03/24/2024 with calcium  score of 1241 (99 percentile) with overall moderate coronary disease with mild stenosis 25-49% at the proximal/mid LAD, < 25% stenosis proximal LCx, moderate 50-69% stenosis in proximal to mid RCA.  CT FFR analysis showed no significant stenosis. Meds includes ASA 81 mg, Jardiance , hydrochlorothiazide , Toprol  XL, Crestor . She is also on Wegovy .    Preoperative cardiology evaluation on 09/14/2024 by Carlin Nest, NP. Ms. Karg perioperative risk of a major cardiac event is 0.4% according to the Revised Cardiac Risk Index (RCRI).  Therefore, she is at low risk for perioperative complications.   Her functional capacity is excellent at 8.33 METs according to the Duke Activity Status Index (DASI). Recommendations: According to ACC/AHA guidelines, no further cardiovascular testing needed.  The patient may proceed to surgery at acceptable risk.   Antiplatelet and/or Anticoagulation Recommendations: Aspirin  can be held for 5-7 days prior to her surgery.  Please resume Aspirin  post operatively when it is felt to be safe from a bleeding standpoint.  Last Wegovy  and ASA 09/23/2024.  Anesthesia team to evaluate on the day of  surgery.    VS:  Wt Readings from Last 3 Encounters:  09/21/24 78.5 kg  09/07/24 79.2 kg  08/17/24 81 kg   BP Readings from Last 3 Encounters:  09/21/24 116/87  09/07/24 117/82  08/17/24 132/84   Pulse Readings from Last 3 Encounters:  09/21/24 77  09/07/24 73  08/17/24 69     PROVIDERS: Amon Aloysius BRAVO, MD is PCP  Lonni Slain, MD is cardiologist [place], DO is GI   LABS: For day of surgery as indicated. Most recent results in [facility] include: Lab Results  Component Value Date   WBC 10.8 (H) 08/17/2024   HGB 15.1 (H) 08/17/2024   HCT 45.7 08/17/2024   PLT 313.0 08/17/2024   GLUCOSE 74 08/17/2024   CHOL 159 08/17/2024   TRIG 125.0 08/17/2024   HDL 52.80 08/17/2024   LDLCALC 82 08/17/2024   ALT 14 08/17/2024   AST 17 08/17/2024   NA 138 08/17/2024   K 4.5 08/17/2024   CL 100 08/17/2024   CREATININE 0.94 08/17/2024   BUN 11 08/17/2024   CO2 30 08/17/2024   TSH 2.54 08/17/2024   HGBA1C 5.8 01/29/2024    IMAGES: Xray sacrum/coccyx 09/21/2024: IMPRESSION: 1. Mildly displaced distal transverse sacral fracture at S4 with remodeling of fracture margins, consistent with a subacute to chronic time course.   CT Chest (over read CCTA) 03/24/2024: IMPRESSION: No acute extracardiac incidental findings.    EKG: 03/03/2024: Normal sinus rhythm No acute ST/T wave changes Confirmed by Vannie Mora ([id number]) on 03/03/2024 7:56:04 AM  CV: CTA Coronary 03/24/2024: IMPRESSION: 1. Coronary calcium  score of 1241. This was 99th percentile for age and sex matched control.  2.  Total plaque volume (TPV) is 1075 mm3, categorized as extensive, which is 98th percentile for age- and sex-matched controls.  3. Normal coronary origins with right dominance.  4. CAD-RADS 3 Moderate coronary artery disease.  5. Mild stenosis (25-49%) due to soft plaque at the proximal/mid LAD.  6. Minimal calcified plaque (< 25%) at the proximal LCX, otherwise patent.  7. Moderate  stenosis (50-69%) due to calcified plaque in proximal to mid RCA.  8. CT FFR will be performed and reported separately.  9. Aortic atherosclerosis.  CCT FFR  1. Left Main: FFR = 1.0  2. LAD: Proximal FFR = 0.96, Mid FFR = 0.90, Distal FFR = 0.82 3. LCX: Proximal FFR = 0.98, Distal FFR = 0.94 4. OM1: Proximal FFR = 0.96, Distal FFR = 0.88 5. RCA: Proximal FFR = 1.0, Mid FFR =0.92, Distal FFR = 0.89   IMPRESSION: 1.  CT FFR analysis showed no significant stenosis.  RECOMMENDATIONS: Guideline-directed medical therapy and aggressive risk factor modification for secondary prevention of coronary artery disease.    Past Medical History:  Diagnosis Date   Allergy    Arthritis    Back pain    Bowel obstruction (HCC) 2019   Coronary artery disease    Depression    Gall bladder stones    GERD (gastroesophageal reflux disease)    Hyperlipidemia    Hypertension    IBS (irritable bowel syndrome)    IBS (irritable bowel syndrome)    Joint pain    Obesity    Lap band surgery 12-08 Dr Gladis   OSA (obstructive sleep apnea)    using a CPAP     Pre-diabetes    REDUCTION MAMMOPLASTY, HX OF 09/22/2007   Qualifier: Diagnosis of  By: Amon MD, Aloysius BRAVO.    Sleep apnea    doesn't use CPAP    Past Surgical History:  Procedure Laterality Date   APPENDECTOMY     BREAST REDUCTION SURGERY     CHOLECYSTECTOMY     CLOSED REDUCTION FINGER WITH PERCUTANEOUS PINNING Left 09/14/2014   Procedure: CLOSED REDUCTION PERCUTANEOUS PINNING ;  Surgeon: Franky Curia, MD;  Location: [facility];  Service: Orthopedics;  Laterality: Left;   COLONOSCOPY     ESOPHAGEAL MANOMETRY N/A 09/23/2017   Procedure: ESOPHAGEAL MANOMETRY (EM);  Surgeon: Shila Gustav GAILS, MD;  Location: [facility];  Service: Endoscopy;  Laterality: N/A;   Lap Band Adjustment  09/01/2015   Dr. Tanda   LAPAROSCOPIC GASTRIC BANDING  09/22/2007   Lap band surgery 12-08 Dr Gladis   NASAL SINUS SURGERY     TONSILLECTOMY      x2   UPPER GASTROINTESTINAL ENDOSCOPY      MEDICATIONS: No current facility-administered medications for this encounter.    alendronate  (FOSAMAX ) 70 MG tablet   empagliflozin  (JARDIANCE ) 10 MG TABS tablet   Estradiol  (DIVIGEL ) 1 MG/GM GEL   hydrochlorothiazide  (MICROZIDE ) 12.5 MG capsule   L-Methylfolate-Algae (DEPLIN 7.5) 7.5-90.314 MG CAPS   metoprolol  succinate (TOPROL  XL) 25 MG 24 hr tablet   nitroGLYCERIN  (NITROSTAT ) 0.4 MG SL tablet   nortriptyline  (PAMELOR ) 75 MG capsule   omeprazole  (PRILOSEC) 40 MG capsule   ondansetron  (ZOFRAN -ODT) 4 MG disintegrating tablet   Probiotic Product (CULTURELLE PRO & PREBIOTIC) CHEW   progesterone (PROMETRIUM) 100 MG capsule   rosuvastatin  (CRESTOR ) 10 MG tablet   semaglutide -weight management (WEGOVY ) 2.4 MG/0.75ML SOAJ SQ injection   aspirin  EC 81 MG tablet   linaclotide  (LINZESS ) 72 MCG capsule   meloxicam  (MOBIC ) 7.5  MG tablet    Isaiah Ruder, PA-C Surgical Short Stay/Anesthesiology [facility] Phone [phone] [facility] Phone [phone] 09/29/2024 11:21 AM

## 2024-09-29 NOTE — Progress Notes (Signed)
 SDW call  Patient was given pre-op instructions over the phone. Patient verbalized understanding of instructions provided.  She denies any SOB, fever or cough   PCP - Dr. Aloysius Mech Cardiologist - Dr. Shelda Bruckner, Clearance 09/14/2024 Pulmonary:    PPM/ICD - denies Device Orders - na Rep Notified - na   Chest x-ray - na EKG -  03/03/2024 Stress Test -2000 per pt ECHO - 2000 per pt Cardiac Cath -   Sleep Study/sleep apnea/CPAP: OSA does not wear CPAP  Non-diabetic  GLP1: Wegovy , states last dose 09/23/2024  Jardiance  instructed to hold DOS   Blood Thinner Instructions: denies Aspirin  Instructions:states last dose 09/23/2024   ERAS Protcol - Clears until 0530  Anesthesia review: Yes. Cardiac clearance, CAD, HTN, HLD, OSA  Your procedure is scheduled on Wednesday September 30, 2024  Report to [facility] Main Entrance A at 0630   A.M., then check in with the Admitting office.  Call this number if you have problems the morning of surgery:  [phone]   If you have any questions prior to your surgery date call [phone]: Open Monday-Friday 8am-4pm If you experience any cold or flu symptoms such as cough, fever, chills, shortness of breath, etc. between now and your scheduled surgery, please notify us  at the above number    Remember:  Do not eat after midnight the night before your surgery  You may drink clear liquids until  0530   the morning of your surgery.   Clear liquids allowed are: Water, Non-Citrus Juices (without pulp), Carbonated Beverages, Clear Tea, Black Coffee ONLY (NO MILK, CREAM OR POWDERED CREAMER of any kind), and Gatorade   Take these medicines the morning of surgery with A SIP OF WATER:  Metoprolol , pamelor , prilosec, crestor   As needed: zofran   As of today, STOP taking any Aspirin  (unless otherwise instructed by your surgeon) Aleve, Naproxen, Ibuprofen , Motrin , Advil , Goody's, BC's, all herbal medications, fish oil, and all vitamins.

## 2024-09-30 ENCOUNTER — Other Ambulatory Visit (HOSPITAL_COMMUNITY): Payer: Self-pay

## 2024-09-30 ENCOUNTER — Ambulatory Visit (HOSPITAL_COMMUNITY)
Admission: RE | Admit: 2024-09-30 | Discharge: 2024-09-30 | Disposition: A | Discharge status: Home / Self Care | Attending: Orthopedic Surgery | Admitting: Orthopedic Surgery

## 2024-09-30 ENCOUNTER — Encounter: Admission: RE | Disposition: A | Payer: Self-pay | Attending: Orthopedic Surgery

## 2024-09-30 ENCOUNTER — Ambulatory Visit (HOSPITAL_BASED_OUTPATIENT_CLINIC_OR_DEPARTMENT_OTHER): Payer: Self-pay | Admitting: Family Medicine

## 2024-09-30 ENCOUNTER — Encounter (HOSPITAL_COMMUNITY): Payer: Self-pay | Admitting: Orthopedic Surgery

## 2024-09-30 ENCOUNTER — Ambulatory Visit (HOSPITAL_COMMUNITY): Payer: Self-pay | Admitting: Vascular Surgery

## 2024-09-30 DIAGNOSIS — M23262 Derangement of other lateral meniscus due to old tear or injury, left knee: Secondary | ICD-10-CM

## 2024-09-30 DIAGNOSIS — M25562 Pain in left knee: Secondary | ICD-10-CM

## 2024-09-30 HISTORY — DX: Nausea with vomiting, unspecified: R11.2

## 2024-09-30 HISTORY — DX: Other complications of anesthesia, initial encounter: T88.59XA

## 2024-09-30 HISTORY — DX: Atherosclerotic heart disease of native coronary artery without angina pectoris: I25.10

## 2024-09-30 HISTORY — PX: MENISCUS DEBRIDEMENT: SHX5178

## 2024-09-30 LAB — CBC WITH DIFFERENTIAL/PLATELET
Abs Immature Granulocytes: 0.02 K/uL (ref 0.00–0.07)
Basophils Absolute: 0 K/uL (ref 0.0–0.1)
Basophils Relative: 1 %
Eosinophils Absolute: 0.2 K/uL (ref 0.0–0.5)
Eosinophils Relative: 2 %
HCT: 43.2 % (ref 36.0–46.0)
Hemoglobin: 14.5 g/dL (ref 12.0–15.0)
Immature Granulocytes: 0 %
Lymphocytes Relative: 32 %
Lymphs Abs: 2.7 K/uL (ref 0.7–4.0)
MCH: 30 pg (ref 26.0–34.0)
MCHC: 33.6 g/dL (ref 30.0–36.0)
MCV: 89.3 fL (ref 80.0–100.0)
Monocytes Absolute: 0.5 K/uL (ref 0.1–1.0)
Monocytes Relative: 6 %
Neutro Abs: 5 K/uL (ref 1.7–7.7)
Neutrophils Relative %: 59 %
Platelets: 293 K/uL (ref 150–400)
RBC: 4.84 MIL/uL (ref 3.87–5.11)
RDW: 13.9 % (ref 11.5–15.5)
WBC: 8.5 K/uL (ref 4.0–10.5)
nRBC: 0 % (ref 0.0–0.2)

## 2024-09-30 LAB — COMPREHENSIVE METABOLIC PANEL WITH GFR
ALT: 16 U/L (ref 0–44)
AST: 20 U/L (ref 15–41)
Albumin: 3.6 g/dL (ref 3.5–5.0)
Alkaline Phosphatase: 67 U/L (ref 38–126)
Anion gap: 11 (ref 5–15)
BUN: 10 mg/dL (ref 8–23)
CO2: 25 mmol/L (ref 22–32)
Calcium: 8.6 mg/dL — ABNORMAL LOW (ref 8.9–10.3)
Chloride: 99 mmol/L (ref 98–111)
Creatinine, Ser: 0.84 mg/dL (ref 0.44–1.00)
GFR, Estimated: >60 mL/min (ref >60)
Glucose, Bld: 91 mg/dL (ref 70–99)
Potassium: 3.5 mmol/L (ref 3.5–5.1)
Sodium: 135 mmol/L (ref 135–145)
Total Bilirubin: 0.8 mg/dL (ref 0.0–1.2)
Total Protein: 7.1 g/dL (ref 6.5–8.1)

## 2024-09-30 LAB — GLUCOSE, CAPILLARY
Glucose-Capillary: 93 mg/dL (ref 70–99)
Glucose-Capillary: 87 mg/dL (ref 70–99)

## 2024-09-30 SURGERY — DEBRIDEMENT, MENISCUS, KNEE
Anesthesia: General | Site: Knee | Laterality: Left

## 2024-09-30 MED ORDER — LIDOCAINE 2% (20 MG/ML) 5 ML SYRINGE
INTRAMUSCULAR | Status: DC | PRN
  Administered 2024-09-30: 100 mg via INTRAVENOUS

## 2024-09-30 MED ORDER — FENTANYL CITRATE (PF) 100 MCG/2ML IJ SOLN
INTRAMUSCULAR | Status: DC | PRN
  Administered 2024-09-30 (×2): 50 ug via INTRAVENOUS

## 2024-09-30 MED ORDER — MIDAZOLAM HCL 2 MG/2ML IJ SOLN
INTRAMUSCULAR | Status: AC
  Filled 2024-09-30: qty 2

## 2024-09-30 MED ORDER — FENTANYL CITRATE (PF) 100 MCG/2ML IJ SOLN
INTRAMUSCULAR | Status: AC
  Filled 2024-09-30: qty 2

## 2024-09-30 MED ORDER — SODIUM CHLORIDE 0.9 % IR SOLN
Status: DC | PRN
  Administered 2024-09-30 (×2): 3000 mL

## 2024-09-30 MED ORDER — HYDROCODONE-ACETAMINOPHEN 5-325 MG PO TABS
1.0000 | ORAL_TABLET | ORAL | 0 refills | Status: AC | PRN
  Filled 2024-09-30: qty 30, 5d supply, fill #0

## 2024-09-30 MED ORDER — CHLORHEXIDINE GLUCONATE 0.12 % MT SOLN: 15.0000 mL | Freq: Once | OROMUCOSAL | Status: AC

## 2024-09-30 MED ORDER — SUGAMMADEX SODIUM 200 MG/2ML IV SOLN
INTRAVENOUS | Status: DC | PRN
  Administered 2024-09-30 (×2): 200 mg via INTRAVENOUS

## 2024-09-30 MED ORDER — PROPOFOL 10 MG/ML IV BOLUS
INTRAVENOUS | Status: DC | PRN
  Administered 2024-09-30 (×2): 50 mg via INTRAVENOUS
  Administered 2024-09-30: 100 mg via INTRAVENOUS

## 2024-09-30 MED ORDER — PROPOFOL 10 MG/ML IV BOLUS
INTRAVENOUS | Status: AC
  Filled 2024-09-30: qty 20

## 2024-09-30 MED ORDER — LACTATED RINGERS IV SOLN: INTRAVENOUS | Status: DC

## 2024-09-30 MED ORDER — OXYCODONE HCL 5 MG/5ML PO SOLN: 5.0000 mg | Freq: Once | ORAL | Status: AC | PRN

## 2024-09-30 MED ORDER — CEFAZOLIN SODIUM-DEXTROSE 2-4 GM/100ML-% IV SOLN
2.0000 g | INTRAVENOUS | Status: AC
  Administered 2024-09-30: 2 g via INTRAVENOUS

## 2024-09-30 MED ORDER — FENTANYL CITRATE (PF) 100 MCG/2ML IJ SOLN
25.0000 ug | INTRAMUSCULAR | Status: DC | PRN
  Administered 2024-09-30: 25 ug via INTRAVENOUS

## 2024-09-30 MED ORDER — PHENYLEPHRINE 80 MCG/ML (10ML) SYRINGE FOR IV PUSH (FOR BLOOD PRESSURE SUPPORT)
PREFILLED_SYRINGE | INTRAVENOUS | Status: DC | PRN
  Administered 2024-09-30 (×2): 160 ug via INTRAVENOUS

## 2024-09-30 MED ORDER — 0.9 % SODIUM CHLORIDE (POUR BTL) OPTIME
TOPICAL | Status: DC | PRN
  Administered 2024-09-30: 1000 mL

## 2024-09-30 MED ORDER — CHLORHEXIDINE GLUCONATE 0.12 % MT SOLN
OROMUCOSAL | Status: AC
  Administered 2024-09-30: 15 mL via OROMUCOSAL
  Filled 2024-09-30: qty 15

## 2024-09-30 MED ORDER — DEXAMETHASONE SOD PHOSPHATE PF 10 MG/ML IJ SOLN
INTRAMUSCULAR | Status: DC | PRN
  Administered 2024-09-30: 10 mg via INTRAVENOUS

## 2024-09-30 MED ORDER — ONDANSETRON HCL 4 MG/2ML IJ SOLN: 4.0000 mg | Freq: Once | INTRAMUSCULAR | Status: DC | PRN

## 2024-09-30 MED ORDER — ROCURONIUM BROMIDE 10 MG/ML (PF) SYRINGE
PREFILLED_SYRINGE | INTRAVENOUS | Status: DC | PRN
  Administered 2024-09-30: 60 mg via INTRAVENOUS

## 2024-09-30 MED ORDER — OXYCODONE HCL 5 MG PO TABS
ORAL_TABLET | ORAL | Status: AC
  Filled 2024-09-30: qty 1

## 2024-09-30 MED ORDER — ORAL CARE MOUTH RINSE: 15.0000 mL | Freq: Once | OROMUCOSAL | Status: AC

## 2024-09-30 MED ORDER — AMISULPRIDE (ANTIEMETIC) 5 MG/2ML IV SOLN: 10.0000 mg | Freq: Once | INTRAVENOUS | Status: DC | PRN

## 2024-09-30 MED ORDER — MIDAZOLAM HCL (PF) 2 MG/2ML IJ SOLN
INTRAMUSCULAR | Status: DC | PRN
  Administered 2024-09-30 (×2): 1 mg via INTRAVENOUS

## 2024-09-30 MED ORDER — ONDANSETRON HCL 4 MG/2ML IJ SOLN
INTRAMUSCULAR | Status: DC | PRN
  Administered 2024-09-30: 4 mg via INTRAVENOUS

## 2024-09-30 MED ORDER — ACETAMINOPHEN 10 MG/ML IV SOLN: 1000.0000 mg | Freq: Once | INTRAVENOUS | Status: DC | PRN

## 2024-09-30 MED ORDER — OXYCODONE HCL 5 MG PO TABS
5.0000 mg | ORAL_TABLET | Freq: Once | ORAL | Status: AC | PRN
  Administered 2024-09-30: 5 mg via ORAL

## 2024-09-30 MED ORDER — CEFAZOLIN SODIUM-DEXTROSE 2-4 GM/100ML-% IV SOLN
INTRAVENOUS | Status: AC
  Filled 2024-09-30: qty 100

## 2024-09-30 SURGICAL SUPPLY — 28 items
BLADE EXCALIBUR 4.0X13 (MISCELLANEOUS) IMPLANT
BNDG COHESIVE 6X5 TAN NS LF (GAUZE/BANDAGES/DRESSINGS) IMPLANT
BNDG COHESIVE 6X5 TAN ST LF (GAUZE/BANDAGES/DRESSINGS) ×1 IMPLANT
BNDG GAUZE DERMACEA FLUFF 4 (GAUZE/BANDAGES/DRESSINGS) ×1 IMPLANT
COVER SURGICAL LIGHT HANDLE (MISCELLANEOUS) ×2 IMPLANT
CUFF TOURN SGL QUICK 42 (TOURNIQUET CUFF) IMPLANT
CUFF TRNQT CYL 34X4.125X (TOURNIQUET CUFF) IMPLANT
DRAPE ARTHROSCOPY W/POUCH 114 (DRAPES) ×1 IMPLANT
DRAPE U-SHAPE 47X51 STRL (DRAPES) ×1 IMPLANT
DRSG EMULSION OIL 3X3 NADH (GAUZE/BANDAGES/DRESSINGS) ×1 IMPLANT
DURAPREP 26ML APPLICATOR (WOUND CARE) ×1 IMPLANT
DW OUTFLOW CASSETTE/TUBE SET (MISCELLANEOUS) IMPLANT
GAUZE PAD ABD 8X10 STRL (GAUZE/BANDAGES/DRESSINGS) IMPLANT
GAUZE SPONGE 4X4 12PLY STRL (GAUZE/BANDAGES/DRESSINGS) ×1 IMPLANT
GLOVE BIOGEL PI IND STRL 9 (GLOVE) ×1 IMPLANT
GLOVE BIOGEL PI ORTHO SZ9 (GLOVE) ×1 IMPLANT
GOWN STRL REUS W/ TWL XL LVL3 (GOWN DISPOSABLE) ×3 IMPLANT
KIT BASIN OR (CUSTOM PROCEDURE TRAY) ×1 IMPLANT
KIT TURNOVER KIT B (KITS) ×1 IMPLANT
MANIFOLD NEPTUNE II (INSTRUMENTS) ×1 IMPLANT
NDL 18GX1X1/2 (RX/OR ONLY) (NEEDLE) ×1 IMPLANT
PACK ARTHROSCOPY DSU (CUSTOM PROCEDURE TRAY) ×1 IMPLANT
PAD ARMBOARD POSITIONER FOAM (MISCELLANEOUS) ×2 IMPLANT
PORT APPOLLO RF 90DEGREE MULTI (SURGICAL WAND) IMPLANT
SUT ETHILON 4 0 PS 2 18 (SUTURE) ×1 IMPLANT
TOWEL GREEN STERILE FF (TOWEL DISPOSABLE) ×2 IMPLANT
TUBE CONNECTING 12X1/4 (SUCTIONS) ×1 IMPLANT
TUBING ARTHROSCOPY IRRIG 16FT (MISCELLANEOUS) ×1 IMPLANT

## 2024-09-30 NOTE — Anesthesia Procedure Notes (Signed)
 Procedure Name: Intubation Date/Time: 09/30/2024 8:35 AM  Performed by: Lamar Lucie DASEN, CRNAPre-anesthesia Checklist: Patient identified, Emergency Drugs available, Suction available and Patient being monitored Patient Re-evaluated:Patient Re-evaluated prior to induction Oxygen Delivery Method: Circle system utilized Preoxygenation: Pre-oxygenation with 100% oxygen Induction Type: IV induction Ventilation: Mask ventilation without difficulty Laryngoscope Size: Mac and 4 Grade View: Grade I Tube type: Oral Tube size: 7.0 mm Number of attempts: 1 Airway Equipment and Method: Stylet and Oral airway Placement Confirmation: ETT inserted through vocal cords under direct vision, positive ETCO2 and breath sounds checked- equal and bilateral Secured at: 21 cm Tube secured with: Tape Dental Injury: Teeth and Oropharynx as per pre-operative assessment

## 2024-09-30 NOTE — Interval H&P Note (Signed)
 History and Physical Interval Note:  09/30/2024 6:50 AM  Rebekah [name]  has presented today for surgery, with the diagnosis of LEFT KNEE LATERAL MENISCUS TEAR.  The various methods of treatment have been discussed with the patient and family. After consideration of risks, benefits and other options for treatment, the patient has consented to  Procedure(s) with comments: DEBRIDEMENT, MENISCUS, KNEE (Left) - Knee arthroscopy as a surgical intervention.  The patient's history has been reviewed, patient examined, no change in status, stable for surgery.  I have reviewed the patient's chart and labs.  Questions were answered to the patient's satisfaction.     Mariadelaluz Guggen[place] V Torell Minder

## 2024-09-30 NOTE — Transfer of Care (Signed)
 Immediate Anesthesia Transfer of Care Note  Patient: Rebekah [name]  Procedure(s) Performed: LEFT KNEE ARTHROSCOPY WITH DEBRIDEMENT (Left: Knee)  Patient Location: PACU  Anesthesia Type:General  Level of Consciousness: drowsy and patient cooperative  Airway & Oxygen Therapy: Patient Spontanous Breathing and Patient connected to nasal cannula oxygen  Post-op Assessment: Report given to RN, Post -op Vital signs reviewed and stable, and Patient moving all extremities X 4  Post vital signs: Reviewed and stable  Last Vitals:  Vitals Value Taken Time  BP 114/72 09/30/24 09:25  Temp 36.6 C 09/30/24 09:25  Pulse 65 09/30/24 09:25  Resp 33 09/30/24 09:25  SpO2 99 % 09/30/24 09:25  Vitals shown include unfiled device data.  Last Pain:  Vitals:   09/30/24 0708  PainSc: 0-No pain         Complications: No notable events documented.

## 2024-09-30 NOTE — Op Note (Signed)
 09/30/2024  9:08 AM  PATIENT:  [name]    PRE-OPERATIVE DIAGNOSIS:  LEFT KNEE LATERAL MENISCUS TEAR  POST-OPERATIVE DIAGNOSIS:  Same  PROCEDURE:  LEFT KNEE ARTHROSCOPY WITH DEBRIDEMENT and excision lateral meniscus.  SURGEON:  Jerona LULLA Sage, MD  PHYSICIAN ASSISTANT:None ANESTHESIA:   General  PREOPERATIVE INDICATIONS:  Rebekah [name] is a  63 y.o. female with a diagnosis of LEFT KNEE LATERAL MENISCUS TEAR who failed conservative measures and elected for surgical management.    The risks benefits and alternatives were discussed with the patient preoperatively including but not limited to the risks of infection, bleeding, nerve injury, cardiopulmonary complications, the need for revision surgery, among others, and the patient was willing to proceed.  OPERATIVE IMPLANTS:   * No implants in log *  @ENCIMAGES @  OPERATIVE FINDINGS: Medial joint line with intact meniscus with small osteochondral defect of the medial femoral condyle that was debrided.  Large degenerative lateral meniscal tear.  OPERATIVE PROCEDURE: Patient was brought the operating room and underwent a general anesthetic.  After adequate levels anesthesia obtained patient's left lower extremity was prepped using DuraPrep draped into a sterile field a timeout was called.  The scope was inserted through the anterior lateral portal and the working portal was established anterior medially.  Visualization showed a small osteochondral defect in the medial femoral condyle this was debrided back to viable margins.  The medial meniscus was intact this was probed and stable.  Examination the notch the ACL was intact.  In the figure-of-four position there was a large degenerative tear of the lateral meniscus.  This was debrided with a shaver and the electrical wand.  This was debrided back to healthy stable margins.  Examination of the patellofemoral joint with the knee extended showed synovitis and this was also debrided.  Survey of  all compartments showed there to be no loose bodies.  The instruments removed portals closed using nylon sterile dressing was applied patient was extubated taken the PACU in stable condition   DISCHARGE PLANNING:  Antibiotic duration: Preoperative antibiotics  Weightbearing: Weightbearing as tolerated  Pain medication: Prescription for Vicodin  Dressing care/ Wound VAC: Dry dressing change in 3 days  Ambulatory devices: Crutches  Discharge to: Home.  Follow-up: In the office 1 week post operative.

## 2024-09-30 NOTE — Discharge Instructions (Signed)
 Weight bearing as tolerants with crutches for balance.  Rest , elevation , Ice.

## 2024-10-01 ENCOUNTER — Encounter (HOSPITAL_COMMUNITY): Payer: Self-pay | Admitting: Orthopedic Surgery

## 2024-10-01 NOTE — Anesthesia Postprocedure Evaluation (Addendum)
 Anesthesia Post Note  Patient: Rebekah [name]  Procedure(s) Performed: LEFT KNEE ARTHROSCOPY WITH DEBRIDEMENT (Left: Knee)     Patient location during evaluation: PACU Anesthesia Type: General Level of consciousness: awake Pain management: pain level controlled Vital Signs Assessment: post-procedure vital signs reviewed and stable Respiratory status: spontaneous breathing Cardiovascular status: blood pressure returned to baseline Postop Assessment: no apparent nausea or vomiting Anesthetic complications: no   No notable events documented.  Last Vitals:  Vitals:   09/30/24 0945 09/30/24 1000  BP: 115/63 121/84  Pulse: 65 62  Resp: 15 15  Temp:  36.6 C  SpO2: 94% 94%    Last Pain:  Vitals:   09/30/24 1000  PainSc: 4                  Isaack Preble T Colhoun

## 2024-10-05 MED ORDER — ROPIVACAINE HCL 5 MG/ML IJ SOLN
INTRAMUSCULAR | Status: DC | PRN
  Administered 2024-09-30: 08:00:00 20 mL via PERINEURAL

## 2024-10-05 NOTE — Anesthesia Procedure Notes (Signed)
 Anesthesia Regional Block: Adductor canal block   Pre-Anesthetic Checklist: , timeout performed,  Correct Patient, Correct Site, Correct Laterality,  Correct Procedure, Correct Position, site marked,  Risks and benefits discussed,  Surgical consent,  Pre-op evaluation,  At surgeon's request and post-op pain management  Laterality: Lower and Left  Prep: chloraprep       Needles:  Injection technique: Single-shot  Needle Type: Echogenic Stimulator Needle     Needle Length: 10cm  Needle Gauge: 20     Additional Needles:   Procedures:,,,, ultrasound used (permanent image in chart),, #20gu IV placed    Narrative:  Start time: 09/30/2024 7:53 AM End time: 09/30/2024 7:53 AM Injection made incrementally with aspirations every 5 mL.  Performed by: Personally  Anesthesiologist: Colhoun, Lauraine DASEN, MD  Additional Notes: Timeout performed with bedside RN - Name, DOB, allergies and laterality confirmed by the patient and RN. Surgical marking performed/confirmed. Anticoagulation status and most recent platelet count reviewed. Patient placed in a frog-leg position and sedation (as documented by RN) given via PIV. Peripheral nerve block performed as documented above. VSS throughout (see Flowchart).

## 2024-10-05 NOTE — Addendum Note (Signed)
 Addendum  created 10/05/24 1529 by Dene Lauraine DASEN, MD   Child order released for a procedure order, Clinical Note Signed, Intraprocedure Blocks edited, Intraprocedure Meds edited, SmartForm saved

## 2024-10-09 ENCOUNTER — Ambulatory Visit: Admitting: Family

## 2024-10-09 ENCOUNTER — Encounter: Payer: Self-pay | Admitting: Family

## 2024-10-09 DIAGNOSIS — M1712 Unilateral primary osteoarthritis, left knee: Secondary | ICD-10-CM

## 2024-10-09 NOTE — Progress Notes (Signed)
 "  Post-Op Visit Note   Patient: Rebekah [name]           Date of Birth: 23-Jun-1961           MRN: [id number] Visit Date: 10/09/2024 PCP: Amon Aloysius BRAVO, MD  Chief Complaint:  Chief Complaint  Patient presents with   Left Knee - Routine Post Op    09/30/2024 Left knee scope and deb    HPI:  HPI The patient is a 63 year old woman seen status post left knee arthroscopy with debridement on December 10 she feels well she used a walker for a few days but overall has been progressing well  Using Tylenol  for pain Ortho Exam Portals well-healed sutures harvested today without incident no erythema or drainage good range of motion of the knee  Visit Diagnoses: No diagnosis found.  Plan: Continue increasing activities as tolerated she will follow-up in 4 weeks if she fails to progress as expected  Follow-Up Instructions: Return in about 1 week (around 10/16/2024).   Imaging: No results found.  Orders:  No orders of the defined types were placed in this encounter.  No orders of the defined types were placed in this encounter.    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, left knee 09/30/2024   Fx sacrum/coccyx-closed (HCC) 09/07/2024   Osteopenia 01/29/2022   HGSIL (high grade squamous intraepithelial lesion) on Pap smear of cervix 07/12/2021   Morbid obesity (HCC) 01/10/2020   Migraines 11/13/2019   Hyperglycemia, unspecified 07/21/2018   SBO (small bowel obstruction) (HCC) 09/28/2017   Dysphagia    PCP NOTES >>>> 09/25/2015   Annual physical exam 09/05/2011   GERD 10/12/2009   Hyperlipidemia 09/22/2007   Anxiety and depression 09/22/2007   Essential hypertension 09/22/2007   Seasonal and perennial allergic rhinitis 09/22/2007   Obstructive sleep apnea 09/22/2007   Past Medical History:  Diagnosis Date   Allergy    Arthritis    Back pain    Bowel obstruction (HCC) 2019   Complication of anesthesia     Coronary artery disease    Depression    Gall bladder stones    GERD (gastroesophageal reflux disease)    Hyperlipidemia    Hypertension    IBS (irritable bowel syndrome)    IBS (irritable bowel syndrome)    Joint pain    Obesity    Lap band surgery 12-08 Dr Gladis   OSA (obstructive sleep apnea)    does not use CPAP   PONV (postoperative nausea and vomiting)    Pre-diabetes    REDUCTION MAMMOPLASTY, HX OF 09/22/2007   Qualifier: Diagnosis of  By: Amon MD, Aloysius DRAFTS    Sleep apnea    doesn't use CPAP    Family History  Problem Relation Age of Onset   Breast cancer Mother        age 37   Heart disease Mother        CABG, smoker .age onset 74s   Diverticulitis Mother    High blood pressure Mother    High Cholesterol Mother    Thyroid  disease Mother    Depression Mother    Obesity Mother    Coronary artery disease Father        CABG, smoker .age onset 71s   Prostate cancer Father    Pulmonary fibrosis Father        and others   High blood pressure Father  High Cholesterol Father    Depression Father    Obesity Father    Heart attack Brother        age 37   Diabetes Neg Hx    Colon cancer Neg Hx    Stomach cancer Neg Hx    Esophageal cancer Neg Hx    Rectal cancer Neg Hx     Past Surgical History:  Procedure Laterality Date   APPENDECTOMY     BREAST REDUCTION SURGERY     CHOLECYSTECTOMY     CLOSED REDUCTION FINGER WITH PERCUTANEOUS PINNING Left 09/14/2014   Procedure: CLOSED REDUCTION PERCUTANEOUS PINNING ;  Surgeon: Franky Curia, MD;  Location: [facility];  Service: Orthopedics;  Laterality: Left;   COLONOSCOPY     ESOPHAGEAL MANOMETRY N/A 09/23/2017   Procedure: ESOPHAGEAL MANOMETRY (EM);  Surgeon: Shila Gustav GAILS, MD;  Location: [facility];  Service: Endoscopy;  Laterality: N/A;   Lap Band Adjustment  09/01/2015   Dr. Tanda   LAPAROSCOPIC GASTRIC BANDING  09/22/2007   Lap band surgery 12-08 Dr Gladis   MENISCUS DEBRIDEMENT Left  09/30/2024   Procedure: LEFT KNEE ARTHROSCOPY WITH DEBRIDEMENT;  Surgeon: Harden Jerona GAILS, MD;  Location: [facility] OR;  Service: Orthopedics;  Laterality: Left;   NASAL SINUS SURGERY     TONSILLECTOMY     x2   UPPER GASTROINTESTINAL ENDOSCOPY     Social History   Occupational History   Occupation: RETIRED--[employer]- works from home    Comment: retired 2022  Tobacco Use   Smoking status: Never    Passive exposure: Never   Smokeless tobacco: Never  Vaping Use   Vaping status: Never Used  Substance and Sexual Activity   Alcohol use: Yes    Comment: socially    Drug use: No   Sexual activity: Yes    Partners: Male    Birth control/protection: None, Post-menopausal    "

## 2024-10-29 ENCOUNTER — Encounter: Payer: Self-pay | Admitting: Physician Assistant

## 2024-10-29 ENCOUNTER — Ambulatory Visit (INDEPENDENT_AMBULATORY_CARE_PROVIDER_SITE_OTHER): Admitting: Physician Assistant

## 2024-10-29 DIAGNOSIS — M1712 Unilateral primary osteoarthritis, left knee: Secondary | ICD-10-CM

## 2024-10-29 NOTE — Progress Notes (Signed)
 "  Office Visit Note   Patient: Rebekah [name]           Date of Birth: 1960-11-23           MRN: [id number] Visit Date: 10/29/2024              Requested by: Amon Aloysius BRAVO, MD [address] [place],  [place] [address] PCP: Amon Aloysius BRAVO, MD  Chief Complaint  Patient presents with   Left Knee - Routine Post Op    09/30/2024 Left knee scope and deb      HPI: 64 y/o female is here for follow up s/p left knee arthroscopy.  She still has swelling on and off and has pain with stairs and inclines and declines.  She is getting better over all.    Assessment & Plan: Visit Diagnoses:  1. Primary osteoarthritis of left knee     Plan: Well healed incisions with mild edema in the left knee s/p arthroscopy.  She will start a LE strengthening program to keep her knees well supported.  Activity as tolerates.  She will have swelling on/off for 3-6 months.  If she has issues she will call our office. Ice, anti inflammatories PRN.    Follow-Up Instructions: Return if symptoms worsen or fail to improve.   Ortho Exam  Patient is alert, oriented, no adenopathy, well-dressed, normal affect, normal respiratory effort. Well healed incisions without cellulitis.  Minimal edema compared to the right knee.  No effusion.  Intact full active ROM of the left knee.      Imaging: No results found. No images are attached to the encounter.  Labs: Lab Results  Component Value Date   HGBA1C 5.8 01/29/2024   HGBA1C 6.0 07/31/2023   HGBA1C 6.4 02/25/2023   ESRSEDRATE 18 06/14/2016   ESRSEDRATE 31 (H) 06/20/2011   LABORGA Insignificant Growth 07/04/2012     Lab Results  Component Value Date   ALBUMIN 3.6 09/30/2024   ALBUMIN 4.5 08/17/2024   ALBUMIN 4.0 05/02/2023    No results found for: MG Lab Results  Component Value Date   VD25OH 54.3 10/25/2021   VD25OH 61.8 03/08/2021   VD25OH 44.0 09/29/2020    No results found for: PREALBUMIN    Latest Ref Rng & Units 09/30/2024     7:20 AM 08/17/2024    2:26 PM 01/29/2024    9:17 AM  CBC EXTENDED  WBC 4.0 - 10.5 K/uL 8.5  10.8  8.6   RBC 3.87 - 5.11 MIL/uL 4.84  5.07  5.23   Hemoglobin 12.0 - 15.0 g/dL 85.4  84.8  84.6   HCT 36.0 - 46.0 % 43.2  45.7  46.8   Platelets 150 - 400 K/uL 293  313.0  318.0   NEUT# 1.7 - 7.7 K/uL 5.0  6.2  4.9   Lymph# 0.7 - 4.0 K/uL 2.7  3.8  3.0      There is no height or weight on file to calculate BMI.  Orders:  No orders of the defined types were placed in this encounter.  No orders of the defined types were placed in this encounter.    Procedures: No procedures performed  Clinical Data: No additional findings.  ROS:  All other systems negative, except as noted in the HPI. Review of Systems  Objective: Vital Signs: There were no vitals taken for this visit.  Specialty Comments:  No specialty comments available.  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, left knee 09/30/2024   Fx sacrum/coccyx-closed (HCC) 09/07/2024   Osteopenia 01/29/2022   HGSIL (high grade squamous intraepithelial lesion) on Pap smear of cervix 07/12/2021   Morbid obesity (HCC) 01/10/2020   Migraines 11/13/2019   Hyperglycemia, unspecified 07/21/2018   SBO (small bowel obstruction) (HCC) 09/28/2017   Dysphagia    PCP NOTES >>>> 09/25/2015   Annual physical exam 09/05/2011   GERD 10/12/2009   Hyperlipidemia 09/22/2007   Anxiety and depression 09/22/2007   Essential hypertension 09/22/2007   Seasonal and perennial allergic rhinitis 09/22/2007   Obstructive sleep apnea 09/22/2007   Past Medical History:  Diagnosis Date   Allergy    Arthritis    Back pain    Bowel obstruction (HCC) 2019   Complication of anesthesia    Coronary artery disease    Depression    Gall bladder stones    GERD (gastroesophageal reflux disease)    Hyperlipidemia    Hypertension    IBS (irritable bowel syndrome)    IBS (irritable  bowel syndrome)    Joint pain    Obesity    Lap band surgery 12-08 Dr Gladis   OSA (obstructive sleep apnea)    does not use CPAP   PONV (postoperative nausea and vomiting)    Pre-diabetes    REDUCTION MAMMOPLASTY, HX OF 09/22/2007   Qualifier: Diagnosis of  By: Amon MD, Aloysius DRAFTS    Sleep apnea    doesn't use CPAP    Family History  Problem Relation Age of Onset   Breast cancer Mother        age 94   Heart disease Mother        CABG, smoker .age onset 94s   Diverticulitis Mother    High blood pressure Mother    High Cholesterol Mother    Thyroid  disease Mother    Depression Mother    Obesity Mother    Coronary artery disease Father        CABG, smoker .age onset 60s   Prostate cancer Father    Pulmonary fibrosis Father        and others   High blood pressure Father    High Cholesterol Father    Depression Father    Obesity Father    Heart attack Brother        age 46   Diabetes Neg Hx    Colon cancer Neg Hx    Stomach cancer Neg Hx    Esophageal cancer Neg Hx    Rectal cancer Neg Hx     Past Surgical History:  Procedure Laterality Date   APPENDECTOMY     BREAST REDUCTION SURGERY     CHOLECYSTECTOMY     CLOSED REDUCTION FINGER WITH PERCUTANEOUS PINNING Left 09/14/2014   Procedure: CLOSED REDUCTION PERCUTANEOUS PINNING ;  Surgeon: Franky Curia, MD;  Location: [facility];  Service: Orthopedics;  Laterality: Left;   COLONOSCOPY     ESOPHAGEAL MANOMETRY N/A 09/23/2017   Procedure: ESOPHAGEAL MANOMETRY (EM);  Surgeon: Shila Gustav GAILS, MD;  Location: [facility];  Service: Endoscopy;  Laterality: N/A;   Lap Band Adjustment  09/01/2015   Dr. Tanda   LAPAROSCOPIC GASTRIC BANDING  09/22/2007   Lap band surgery 12-08 Dr Gladis   MENISCUS DEBRIDEMENT Left 09/30/2024   Procedure: LEFT KNEE ARTHROSCOPY WITH DEBRIDEMENT;  Surgeon: Harden Jerona GAILS, MD;  Location: [facility] OR;  Service: Orthopedics;  Laterality: Left;  NASAL SINUS SURGERY     TONSILLECTOMY      x2   UPPER GASTROINTESTINAL ENDOSCOPY     Social History   Occupational History   Occupation: RETIRED--[employer]- works from home    Comment: retired 2022  Tobacco Use   Smoking status: Never    Passive exposure: Never   Smokeless tobacco: Never  Vaping Use   Vaping status: Never Used  Substance and Sexual Activity   Alcohol use: Yes    Comment: socially    Drug use: No   Sexual activity: Yes    Partners: Male    Birth control/protection: None, Post-menopausal       "

## 2024-11-02 ENCOUNTER — Other Ambulatory Visit: Payer: Self-pay

## 2024-11-02 ENCOUNTER — Other Ambulatory Visit (HOSPITAL_COMMUNITY): Payer: Self-pay

## 2024-11-22 ENCOUNTER — Other Ambulatory Visit: Payer: Self-pay | Admitting: Internal Medicine

## 2024-11-26 ENCOUNTER — Other Ambulatory Visit: Payer: Self-pay | Admitting: Gastroenterology

## 2024-12-16 ENCOUNTER — Ambulatory Visit: Admitting: Internal Medicine

## 2025-07-07 ENCOUNTER — Other Ambulatory Visit (HOSPITAL_BASED_OUTPATIENT_CLINIC_OR_DEPARTMENT_OTHER)
